# Patient Record
Sex: Male | Born: 1945 | Race: White | Hispanic: No | Smoking: Never smoker
Health system: Southern US, Community
[De-identification: ages and names within clinical notes are randomized; demographics above are authoritative.]

## PROBLEM LIST (undated history)

## (undated) ENCOUNTER — Emergency Department (HOSPITAL_COMMUNITY): Admission: EM | Payer: Self-pay | Source: Home / Self Care

## (undated) DIAGNOSIS — N189 Chronic kidney disease, unspecified: Secondary | ICD-10-CM

## (undated) DIAGNOSIS — Z974 Presence of external hearing-aid: Secondary | ICD-10-CM

## (undated) DIAGNOSIS — F319 Bipolar disorder, unspecified: Secondary | ICD-10-CM

## (undated) DIAGNOSIS — B029 Zoster without complications: Secondary | ICD-10-CM

## (undated) DIAGNOSIS — E871 Hypo-osmolality and hyponatremia: Secondary | ICD-10-CM

## (undated) DIAGNOSIS — R7989 Other specified abnormal findings of blood chemistry: Secondary | ICD-10-CM

## (undated) DIAGNOSIS — R0902 Hypoxemia: Secondary | ICD-10-CM

## (undated) DIAGNOSIS — W3400XA Accidental discharge from unspecified firearms or gun, initial encounter: Secondary | ICD-10-CM

## (undated) DIAGNOSIS — D649 Anemia, unspecified: Secondary | ICD-10-CM

## (undated) DIAGNOSIS — E079 Disorder of thyroid, unspecified: Secondary | ICD-10-CM

## (undated) DIAGNOSIS — J189 Pneumonia, unspecified organism: Secondary | ICD-10-CM

## (undated) DIAGNOSIS — K900 Celiac disease: Secondary | ICD-10-CM

## (undated) DIAGNOSIS — H9190 Unspecified hearing loss, unspecified ear: Secondary | ICD-10-CM

## (undated) DIAGNOSIS — K659 Peritonitis, unspecified: Secondary | ICD-10-CM

## (undated) HISTORY — DX: Celiac disease: K90.0

## (undated) HISTORY — DX: Disorder of thyroid, unspecified: E07.9

## (undated) HISTORY — DX: Other disorders of phosphorus metabolism: E83.39

## (undated) HISTORY — DX: Accidental discharge from unspecified firearms or gun, initial encounter: W34.00XA

## (undated) HISTORY — PX: TONSILLECTOMY: SUR1361

## (undated) HISTORY — DX: Other specified abnormal findings of blood chemistry: R79.89

## (undated) HISTORY — DX: Bipolar disorder, unspecified: F31.9

## (undated) HISTORY — PX: URETHRAL DILATION: SUR417

## (undated) HISTORY — PX: SPINE SURGERY: SHX786

## (undated) HISTORY — DX: Zoster without complications: B02.9

## (undated) HISTORY — DX: Chronic kidney disease, unspecified: N18.9

---

## 2005-01-29 DIAGNOSIS — W3400XA Accidental discharge from unspecified firearms or gun, initial encounter: Secondary | ICD-10-CM

## 2005-01-29 HISTORY — PX: BRAIN SURGERY: SHX531

## 2005-01-29 HISTORY — DX: Accidental discharge from unspecified firearms or gun, initial encounter: W34.00XA

## 2006-02-11 ENCOUNTER — Encounter (INDEPENDENT_AMBULATORY_CARE_PROVIDER_SITE_OTHER): Payer: Self-pay | Admitting: Family Medicine

## 2006-02-11 LAB — CONVERTED CEMR LAB
HDL: 20 mg/dL
LDL Cholesterol: 69 mg/dL

## 2006-07-25 ENCOUNTER — Ambulatory Visit: Payer: Self-pay | Admitting: Sports Medicine

## 2006-07-25 ENCOUNTER — Inpatient Hospital Stay (HOSPITAL_COMMUNITY): Admission: EM | Admit: 2006-07-25 | Discharge: 2006-07-30 | Payer: Self-pay | Admitting: Emergency Medicine

## 2006-07-29 ENCOUNTER — Encounter (INDEPENDENT_AMBULATORY_CARE_PROVIDER_SITE_OTHER): Payer: Self-pay | Admitting: Sports Medicine

## 2006-08-06 ENCOUNTER — Ambulatory Visit: Payer: Self-pay | Admitting: Family Medicine

## 2006-08-06 DIAGNOSIS — F319 Bipolar disorder, unspecified: Secondary | ICD-10-CM | POA: Insufficient documentation

## 2006-08-07 ENCOUNTER — Encounter (INDEPENDENT_AMBULATORY_CARE_PROVIDER_SITE_OTHER): Payer: Self-pay | Admitting: Family Medicine

## 2006-08-08 ENCOUNTER — Telehealth (INDEPENDENT_AMBULATORY_CARE_PROVIDER_SITE_OTHER): Payer: Self-pay | Admitting: Family Medicine

## 2006-08-08 ENCOUNTER — Encounter: Admission: RE | Admit: 2006-08-08 | Discharge: 2006-09-16 | Payer: Self-pay | Admitting: Family Medicine

## 2006-08-20 ENCOUNTER — Telehealth: Payer: Self-pay | Admitting: *Deleted

## 2006-08-20 ENCOUNTER — Telehealth (INDEPENDENT_AMBULATORY_CARE_PROVIDER_SITE_OTHER): Payer: Self-pay | Admitting: *Deleted

## 2006-08-21 ENCOUNTER — Telehealth: Payer: Self-pay | Admitting: *Deleted

## 2006-08-22 ENCOUNTER — Ambulatory Visit: Payer: Self-pay | Admitting: Family Medicine

## 2006-08-22 ENCOUNTER — Telehealth: Payer: Self-pay | Admitting: Family Medicine

## 2006-08-22 ENCOUNTER — Ambulatory Visit (HOSPITAL_COMMUNITY): Admission: RE | Admit: 2006-08-22 | Discharge: 2006-08-22 | Payer: Self-pay | Admitting: Family Medicine

## 2006-08-22 DIAGNOSIS — N186 End stage renal disease: Secondary | ICD-10-CM

## 2006-08-22 DIAGNOSIS — I951 Orthostatic hypotension: Secondary | ICD-10-CM

## 2006-08-22 DIAGNOSIS — Z992 Dependence on renal dialysis: Secondary | ICD-10-CM

## 2006-08-22 DIAGNOSIS — D649 Anemia, unspecified: Secondary | ICD-10-CM

## 2006-08-23 LAB — CONVERTED CEMR LAB
BUN: 56 mg/dL — ABNORMAL HIGH (ref 6–23)
Calcium: 8.3 mg/dL — ABNORMAL LOW (ref 8.4–10.5)
Creatinine, Ser: 2.22 mg/dL — ABNORMAL HIGH (ref 0.40–1.50)
Glucose, Bld: 95 mg/dL (ref 70–99)
Potassium: 2.9 meq/L — ABNORMAL LOW (ref 3.5–5.3)

## 2006-08-26 ENCOUNTER — Ambulatory Visit: Payer: Self-pay | Admitting: Family Medicine

## 2006-08-26 ENCOUNTER — Encounter (INDEPENDENT_AMBULATORY_CARE_PROVIDER_SITE_OTHER): Payer: Self-pay | Admitting: Family Medicine

## 2006-08-26 LAB — CONVERTED CEMR LAB
CO2: 22 meq/L (ref 19–32)
Calcium: 9 mg/dL (ref 8.4–10.5)
MCV: 91.2 fL (ref 78.0–100.0)
Platelets: 287 10*3/uL (ref 150–400)
Potassium: 4.6 meq/L (ref 3.5–5.3)
RBC: 3.86 M/uL — ABNORMAL LOW (ref 4.22–5.81)
Sodium: 141 meq/L (ref 135–145)
WBC: 8.8 10*3/uL (ref 4.0–10.5)

## 2006-08-28 ENCOUNTER — Telehealth: Payer: Self-pay | Admitting: Family Medicine

## 2006-08-29 ENCOUNTER — Telehealth (INDEPENDENT_AMBULATORY_CARE_PROVIDER_SITE_OTHER): Payer: Self-pay | Admitting: Family Medicine

## 2006-08-29 ENCOUNTER — Encounter: Payer: Self-pay | Admitting: *Deleted

## 2006-09-02 ENCOUNTER — Telehealth: Payer: Self-pay | Admitting: *Deleted

## 2006-09-03 ENCOUNTER — Ambulatory Visit (HOSPITAL_COMMUNITY): Admission: RE | Admit: 2006-09-03 | Discharge: 2006-09-03 | Payer: Self-pay | Admitting: Family Medicine

## 2006-09-03 ENCOUNTER — Encounter (INDEPENDENT_AMBULATORY_CARE_PROVIDER_SITE_OTHER): Payer: Self-pay | Admitting: Family Medicine

## 2006-09-03 ENCOUNTER — Telehealth: Payer: Self-pay | Admitting: *Deleted

## 2006-09-04 ENCOUNTER — Ambulatory Visit (HOSPITAL_COMMUNITY): Admission: RE | Admit: 2006-09-04 | Discharge: 2006-09-04 | Payer: Self-pay | Admitting: Family Medicine

## 2006-09-05 ENCOUNTER — Ambulatory Visit: Payer: Self-pay | Admitting: Family Medicine

## 2006-09-05 ENCOUNTER — Encounter (INDEPENDENT_AMBULATORY_CARE_PROVIDER_SITE_OTHER): Payer: Self-pay | Admitting: Family Medicine

## 2006-09-05 DIAGNOSIS — R1314 Dysphagia, pharyngoesophageal phase: Secondary | ICD-10-CM

## 2006-09-09 ENCOUNTER — Ambulatory Visit: Payer: Self-pay | Admitting: Sports Medicine

## 2006-09-09 DIAGNOSIS — K08109 Complete loss of teeth, unspecified cause, unspecified class: Secondary | ICD-10-CM | POA: Insufficient documentation

## 2006-09-13 ENCOUNTER — Telehealth: Payer: Self-pay | Admitting: *Deleted

## 2006-09-13 ENCOUNTER — Emergency Department (HOSPITAL_COMMUNITY): Admission: EM | Admit: 2006-09-13 | Discharge: 2006-09-13 | Payer: Self-pay | Admitting: Emergency Medicine

## 2006-09-18 ENCOUNTER — Telehealth: Payer: Self-pay | Admitting: *Deleted

## 2006-09-26 ENCOUNTER — Encounter: Payer: Self-pay | Admitting: Family Medicine

## 2006-09-27 ENCOUNTER — Telehealth (INDEPENDENT_AMBULATORY_CARE_PROVIDER_SITE_OTHER): Payer: Self-pay | Admitting: Family Medicine

## 2006-10-01 ENCOUNTER — Telehealth (INDEPENDENT_AMBULATORY_CARE_PROVIDER_SITE_OTHER): Payer: Self-pay | Admitting: Family Medicine

## 2006-10-17 ENCOUNTER — Encounter (INDEPENDENT_AMBULATORY_CARE_PROVIDER_SITE_OTHER): Payer: Self-pay | Admitting: Family Medicine

## 2006-10-17 ENCOUNTER — Ambulatory Visit: Payer: Self-pay | Admitting: Family Medicine

## 2006-10-18 ENCOUNTER — Encounter (INDEPENDENT_AMBULATORY_CARE_PROVIDER_SITE_OTHER): Payer: Self-pay | Admitting: Family Medicine

## 2006-10-22 ENCOUNTER — Encounter (INDEPENDENT_AMBULATORY_CARE_PROVIDER_SITE_OTHER): Payer: Self-pay | Admitting: Family Medicine

## 2006-12-18 ENCOUNTER — Telehealth: Payer: Self-pay | Admitting: *Deleted

## 2006-12-18 ENCOUNTER — Ambulatory Visit: Payer: Self-pay | Admitting: Family Medicine

## 2006-12-20 ENCOUNTER — Encounter (INDEPENDENT_AMBULATORY_CARE_PROVIDER_SITE_OTHER): Payer: Self-pay | Admitting: Family Medicine

## 2007-02-07 ENCOUNTER — Ambulatory Visit: Payer: Self-pay | Admitting: Family Medicine

## 2008-07-12 ENCOUNTER — Encounter (INDEPENDENT_AMBULATORY_CARE_PROVIDER_SITE_OTHER): Payer: Self-pay | Admitting: Family Medicine

## 2009-11-01 ENCOUNTER — Encounter: Payer: Self-pay | Admitting: Family Medicine

## 2010-02-28 NOTE — Assessment & Plan Note (Signed)
Summary: CKD Update Using (08/26/06) Cr = 2.08, Age 65, weight 130    Complete Medication List: 1)  Valproic Acid Liqd (Valproic acid) .... 250 mg by peg in the am and 575m at night by peg 2)  Zantac 150 Mg Caps (Ranitidine hcl) .... Give through peg tube daily 3)  Haldol Decanoate 50 Mg/ml Soln (Haloperidol decanoate) .... Take every 29th of each month at the family practice center

## 2010-03-20 ENCOUNTER — Encounter: Payer: Self-pay | Admitting: *Deleted
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
## 2010-11-14 LAB — CBC
Platelets: 420 — ABNORMAL HIGH
RBC: 3.83 — ABNORMAL LOW
WBC: 8.4

## 2010-11-14 LAB — BASIC METABOLIC PANEL
BUN: 33 — ABNORMAL HIGH
Creatinine, Ser: 2.34 — ABNORMAL HIGH
GFR calc Af Amer: 35 — ABNORMAL LOW
GFR calc non Af Amer: 29 — ABNORMAL LOW
Potassium: 4.5

## 2010-11-15 LAB — CBC
HCT: 22.8 — ABNORMAL LOW
HCT: 31.3 — ABNORMAL LOW
HCT: 31.3 — ABNORMAL LOW
HCT: 33.1 — ABNORMAL LOW
HCT: 33.8 — ABNORMAL LOW
Hemoglobin: 10.3 — ABNORMAL LOW
Hemoglobin: 10.3 — ABNORMAL LOW
Hemoglobin: 10.8 — ABNORMAL LOW
Hemoglobin: 11.1 — ABNORMAL LOW
MCHC: 32.9
MCV: 86.7
MCV: 87.1
MCV: 87.6
Platelets: 382
Platelets: 401 — ABNORMAL HIGH
RBC: 2.64 — ABNORMAL LOW
RBC: 2.93 — ABNORMAL LOW
RBC: 3.57 — ABNORMAL LOW
RDW: 15.9 — ABNORMAL HIGH
RDW: 15.9 — ABNORMAL HIGH
RDW: 16.1 — ABNORMAL HIGH
WBC: 14.6 — ABNORMAL HIGH
WBC: 17.7 — ABNORMAL HIGH
WBC: 33.3 — ABNORMAL HIGH
WBC: 7.2
WBC: 8.8

## 2010-11-15 LAB — BASIC METABOLIC PANEL
BUN: 40 — ABNORMAL HIGH
BUN: 46 — ABNORMAL HIGH
CO2: 23
CO2: 25
Calcium: 8.1 — ABNORMAL LOW
Calcium: 8.1 — ABNORMAL LOW
Chloride: 118 — ABNORMAL HIGH
Chloride: 123 — ABNORMAL HIGH
Chloride: 123 — ABNORMAL HIGH
Creatinine, Ser: 3.28 — ABNORMAL HIGH
Creatinine, Ser: 3.67 — ABNORMAL HIGH
GFR calc Af Amer: 21 — ABNORMAL LOW
GFR calc Af Amer: 23 — ABNORMAL LOW
GFR calc Af Amer: 26 — ABNORMAL LOW
GFR calc Af Amer: 34 — ABNORMAL LOW
GFR calc non Af Amer: 19 — ABNORMAL LOW
GFR calc non Af Amer: 21 — ABNORMAL LOW
GFR calc non Af Amer: 26 — ABNORMAL LOW
Glucose, Bld: 109 — ABNORMAL HIGH
Glucose, Bld: 118 — ABNORMAL HIGH
Glucose, Bld: 129 — ABNORMAL HIGH
Glucose, Bld: 130 — ABNORMAL HIGH
Potassium: 3.9
Potassium: 4.2
Potassium: 4.4
Potassium: 4.6
Sodium: 148 — ABNORMAL HIGH
Sodium: 150 — ABNORMAL HIGH
Sodium: 152 — ABNORMAL HIGH
Sodium: 154 — ABNORMAL HIGH

## 2010-11-15 LAB — PREPARE RBC (CROSSMATCH)

## 2010-11-15 LAB — COMPREHENSIVE METABOLIC PANEL
Alkaline Phosphatase: 225 — ABNORMAL HIGH
BUN: 55 — ABNORMAL HIGH
CO2: 22
Chloride: 101
GFR calc non Af Amer: 17 — ABNORMAL LOW
Glucose, Bld: 102 — ABNORMAL HIGH
Potassium: 5.2 — ABNORMAL HIGH
Total Bilirubin: 0.7

## 2010-11-15 LAB — CATH TIP CULTURE: Culture: 50

## 2010-11-15 LAB — DIFFERENTIAL
Basophils Absolute: 0
Basophils Relative: 0
Eosinophils Absolute: 0.1
Lymphocytes Relative: 6 — ABNORMAL LOW
Lymphs Abs: 0.9
Monocytes Absolute: 2.1 — ABNORMAL HIGH
Monocytes Relative: 4
Neutro Abs: 30 — ABNORMAL HIGH
Neutrophils Relative %: 89 — ABNORMAL HIGH
Neutrophils Relative %: 90 — ABNORMAL HIGH

## 2010-11-15 LAB — CULTURE, BLOOD (ROUTINE X 2): Culture: NO GROWTH

## 2010-11-15 LAB — RETICULOCYTES
Retic Count, Absolute: 54.8
Retic Count, Absolute: 55.8

## 2010-11-15 LAB — ABO/RH: ABO/RH(D): O NEG

## 2010-11-15 LAB — URINE CULTURE: Colony Count: NO GROWTH

## 2010-11-15 LAB — TYPE AND SCREEN: Antibody Screen: NEGATIVE

## 2010-11-15 LAB — URINALYSIS, ROUTINE W REFLEX MICROSCOPIC
Bilirubin Urine: NEGATIVE
Nitrite: NEGATIVE
Specific Gravity, Urine: 1.009
Urobilinogen, UA: 0.2

## 2010-11-15 LAB — CREATININE, SERUM: Creatinine, Ser: 3.65 — ABNORMAL HIGH

## 2010-11-15 LAB — IRON AND TIBC
Iron: 10 — ABNORMAL LOW
TIBC: 170 — ABNORMAL LOW
UIBC: 160

## 2010-11-15 LAB — SODIUM, URINE, RANDOM: Sodium, Ur: <10

## 2010-11-15 LAB — FOLATE RBC: RBC Folate: 1675 — ABNORMAL HIGH

## 2010-11-15 LAB — URINE MICROSCOPIC-ADD ON

## 2010-11-15 LAB — VITAMIN B12: Vitamin B-12: 283 (ref 211–911)

## 2010-11-15 LAB — FERRITIN: Ferritin: 974 — ABNORMAL HIGH (ref 22–322)

## 2013-01-29 DIAGNOSIS — N186 End stage renal disease: Secondary | ICD-10-CM | POA: Diagnosis not present

## 2013-01-31 DIAGNOSIS — N2581 Secondary hyperparathyroidism of renal origin: Secondary | ICD-10-CM | POA: Diagnosis not present

## 2013-01-31 DIAGNOSIS — E785 Hyperlipidemia, unspecified: Secondary | ICD-10-CM | POA: Diagnosis not present

## 2013-01-31 DIAGNOSIS — N186 End stage renal disease: Secondary | ICD-10-CM | POA: Diagnosis not present

## 2013-01-31 DIAGNOSIS — Z23 Encounter for immunization: Secondary | ICD-10-CM | POA: Diagnosis not present

## 2013-01-31 DIAGNOSIS — D631 Anemia in chronic kidney disease: Secondary | ICD-10-CM | POA: Diagnosis not present

## 2013-02-02 DIAGNOSIS — Z23 Encounter for immunization: Secondary | ICD-10-CM | POA: Diagnosis not present

## 2013-02-02 DIAGNOSIS — N186 End stage renal disease: Secondary | ICD-10-CM | POA: Diagnosis not present

## 2013-02-02 DIAGNOSIS — N2581 Secondary hyperparathyroidism of renal origin: Secondary | ICD-10-CM | POA: Diagnosis not present

## 2013-02-02 DIAGNOSIS — D631 Anemia in chronic kidney disease: Secondary | ICD-10-CM | POA: Diagnosis not present

## 2013-02-02 DIAGNOSIS — E785 Hyperlipidemia, unspecified: Secondary | ICD-10-CM | POA: Diagnosis not present

## 2013-02-04 DIAGNOSIS — E785 Hyperlipidemia, unspecified: Secondary | ICD-10-CM | POA: Diagnosis not present

## 2013-02-04 DIAGNOSIS — D631 Anemia in chronic kidney disease: Secondary | ICD-10-CM | POA: Diagnosis not present

## 2013-02-04 DIAGNOSIS — N2581 Secondary hyperparathyroidism of renal origin: Secondary | ICD-10-CM | POA: Diagnosis not present

## 2013-02-04 DIAGNOSIS — N186 End stage renal disease: Secondary | ICD-10-CM | POA: Diagnosis not present

## 2013-02-04 DIAGNOSIS — Z23 Encounter for immunization: Secondary | ICD-10-CM | POA: Diagnosis not present

## 2013-02-06 DIAGNOSIS — N186 End stage renal disease: Secondary | ICD-10-CM | POA: Diagnosis not present

## 2013-02-06 DIAGNOSIS — D631 Anemia in chronic kidney disease: Secondary | ICD-10-CM | POA: Diagnosis not present

## 2013-02-06 DIAGNOSIS — Z23 Encounter for immunization: Secondary | ICD-10-CM | POA: Diagnosis not present

## 2013-02-06 DIAGNOSIS — N2581 Secondary hyperparathyroidism of renal origin: Secondary | ICD-10-CM | POA: Diagnosis not present

## 2013-02-06 DIAGNOSIS — E785 Hyperlipidemia, unspecified: Secondary | ICD-10-CM | POA: Diagnosis not present

## 2013-02-09 DIAGNOSIS — Z23 Encounter for immunization: Secondary | ICD-10-CM | POA: Diagnosis not present

## 2013-02-09 DIAGNOSIS — D631 Anemia in chronic kidney disease: Secondary | ICD-10-CM | POA: Diagnosis not present

## 2013-02-09 DIAGNOSIS — N2581 Secondary hyperparathyroidism of renal origin: Secondary | ICD-10-CM | POA: Diagnosis not present

## 2013-02-09 DIAGNOSIS — E785 Hyperlipidemia, unspecified: Secondary | ICD-10-CM | POA: Diagnosis not present

## 2013-02-09 DIAGNOSIS — N186 End stage renal disease: Secondary | ICD-10-CM | POA: Diagnosis not present

## 2013-02-11 DIAGNOSIS — E785 Hyperlipidemia, unspecified: Secondary | ICD-10-CM | POA: Diagnosis not present

## 2013-02-11 DIAGNOSIS — D631 Anemia in chronic kidney disease: Secondary | ICD-10-CM | POA: Diagnosis not present

## 2013-02-11 DIAGNOSIS — N2581 Secondary hyperparathyroidism of renal origin: Secondary | ICD-10-CM | POA: Diagnosis not present

## 2013-02-11 DIAGNOSIS — N039 Chronic nephritic syndrome with unspecified morphologic changes: Secondary | ICD-10-CM | POA: Diagnosis not present

## 2013-02-11 DIAGNOSIS — N186 End stage renal disease: Secondary | ICD-10-CM | POA: Diagnosis not present

## 2013-02-11 DIAGNOSIS — Z23 Encounter for immunization: Secondary | ICD-10-CM | POA: Diagnosis not present

## 2013-02-16 DIAGNOSIS — D631 Anemia in chronic kidney disease: Secondary | ICD-10-CM | POA: Diagnosis not present

## 2013-02-16 DIAGNOSIS — Z23 Encounter for immunization: Secondary | ICD-10-CM | POA: Diagnosis not present

## 2013-02-16 DIAGNOSIS — E785 Hyperlipidemia, unspecified: Secondary | ICD-10-CM | POA: Diagnosis not present

## 2013-02-16 DIAGNOSIS — N186 End stage renal disease: Secondary | ICD-10-CM | POA: Diagnosis not present

## 2013-02-16 DIAGNOSIS — N2581 Secondary hyperparathyroidism of renal origin: Secondary | ICD-10-CM | POA: Diagnosis not present

## 2013-02-18 DIAGNOSIS — E785 Hyperlipidemia, unspecified: Secondary | ICD-10-CM | POA: Diagnosis not present

## 2013-02-18 DIAGNOSIS — Z23 Encounter for immunization: Secondary | ICD-10-CM | POA: Diagnosis not present

## 2013-02-18 DIAGNOSIS — N186 End stage renal disease: Secondary | ICD-10-CM | POA: Diagnosis not present

## 2013-02-18 DIAGNOSIS — D631 Anemia in chronic kidney disease: Secondary | ICD-10-CM | POA: Diagnosis not present

## 2013-02-18 DIAGNOSIS — N2581 Secondary hyperparathyroidism of renal origin: Secondary | ICD-10-CM | POA: Diagnosis not present

## 2013-02-20 DIAGNOSIS — E785 Hyperlipidemia, unspecified: Secondary | ICD-10-CM | POA: Diagnosis not present

## 2013-02-20 DIAGNOSIS — N2581 Secondary hyperparathyroidism of renal origin: Secondary | ICD-10-CM | POA: Diagnosis not present

## 2013-02-20 DIAGNOSIS — Z23 Encounter for immunization: Secondary | ICD-10-CM | POA: Diagnosis not present

## 2013-02-20 DIAGNOSIS — D631 Anemia in chronic kidney disease: Secondary | ICD-10-CM | POA: Diagnosis not present

## 2013-02-20 DIAGNOSIS — N186 End stage renal disease: Secondary | ICD-10-CM | POA: Diagnosis not present

## 2013-02-23 DIAGNOSIS — E785 Hyperlipidemia, unspecified: Secondary | ICD-10-CM | POA: Diagnosis not present

## 2013-02-23 DIAGNOSIS — Z23 Encounter for immunization: Secondary | ICD-10-CM | POA: Diagnosis not present

## 2013-02-23 DIAGNOSIS — N2581 Secondary hyperparathyroidism of renal origin: Secondary | ICD-10-CM | POA: Diagnosis not present

## 2013-02-23 DIAGNOSIS — D631 Anemia in chronic kidney disease: Secondary | ICD-10-CM | POA: Diagnosis not present

## 2013-02-23 DIAGNOSIS — N186 End stage renal disease: Secondary | ICD-10-CM | POA: Diagnosis not present

## 2013-02-25 DIAGNOSIS — Z23 Encounter for immunization: Secondary | ICD-10-CM | POA: Diagnosis not present

## 2013-02-25 DIAGNOSIS — N2581 Secondary hyperparathyroidism of renal origin: Secondary | ICD-10-CM | POA: Diagnosis not present

## 2013-02-25 DIAGNOSIS — N186 End stage renal disease: Secondary | ICD-10-CM | POA: Diagnosis not present

## 2013-02-25 DIAGNOSIS — E785 Hyperlipidemia, unspecified: Secondary | ICD-10-CM | POA: Diagnosis not present

## 2013-02-25 DIAGNOSIS — D631 Anemia in chronic kidney disease: Secondary | ICD-10-CM | POA: Diagnosis not present

## 2013-02-27 DIAGNOSIS — N2581 Secondary hyperparathyroidism of renal origin: Secondary | ICD-10-CM | POA: Diagnosis not present

## 2013-02-27 DIAGNOSIS — N186 End stage renal disease: Secondary | ICD-10-CM | POA: Diagnosis not present

## 2013-02-27 DIAGNOSIS — E785 Hyperlipidemia, unspecified: Secondary | ICD-10-CM | POA: Diagnosis not present

## 2013-02-27 DIAGNOSIS — D631 Anemia in chronic kidney disease: Secondary | ICD-10-CM | POA: Diagnosis not present

## 2013-02-27 DIAGNOSIS — Z23 Encounter for immunization: Secondary | ICD-10-CM | POA: Diagnosis not present

## 2013-03-04 DIAGNOSIS — N186 End stage renal disease: Secondary | ICD-10-CM | POA: Diagnosis not present

## 2013-03-04 DIAGNOSIS — N2581 Secondary hyperparathyroidism of renal origin: Secondary | ICD-10-CM | POA: Diagnosis not present

## 2013-03-04 DIAGNOSIS — D631 Anemia in chronic kidney disease: Secondary | ICD-10-CM | POA: Diagnosis not present

## 2013-03-04 DIAGNOSIS — N039 Chronic nephritic syndrome with unspecified morphologic changes: Secondary | ICD-10-CM | POA: Diagnosis not present

## 2013-03-06 DIAGNOSIS — N039 Chronic nephritic syndrome with unspecified morphologic changes: Secondary | ICD-10-CM | POA: Diagnosis not present

## 2013-03-06 DIAGNOSIS — N186 End stage renal disease: Secondary | ICD-10-CM | POA: Diagnosis not present

## 2013-03-06 DIAGNOSIS — N2581 Secondary hyperparathyroidism of renal origin: Secondary | ICD-10-CM | POA: Diagnosis not present

## 2013-03-06 DIAGNOSIS — D631 Anemia in chronic kidney disease: Secondary | ICD-10-CM | POA: Diagnosis not present

## 2013-03-11 DIAGNOSIS — N186 End stage renal disease: Secondary | ICD-10-CM | POA: Diagnosis not present

## 2013-03-11 DIAGNOSIS — N2581 Secondary hyperparathyroidism of renal origin: Secondary | ICD-10-CM | POA: Diagnosis not present

## 2013-03-11 DIAGNOSIS — D631 Anemia in chronic kidney disease: Secondary | ICD-10-CM | POA: Diagnosis not present

## 2013-03-12 DIAGNOSIS — F3174 Bipolar disorder, in full remission, most recent episode manic: Secondary | ICD-10-CM | POA: Diagnosis not present

## 2013-03-13 DIAGNOSIS — D631 Anemia in chronic kidney disease: Secondary | ICD-10-CM | POA: Diagnosis not present

## 2013-03-13 DIAGNOSIS — N186 End stage renal disease: Secondary | ICD-10-CM | POA: Diagnosis not present

## 2013-03-13 DIAGNOSIS — N2581 Secondary hyperparathyroidism of renal origin: Secondary | ICD-10-CM | POA: Diagnosis not present

## 2013-03-16 DIAGNOSIS — N186 End stage renal disease: Secondary | ICD-10-CM | POA: Diagnosis not present

## 2013-03-16 DIAGNOSIS — N2581 Secondary hyperparathyroidism of renal origin: Secondary | ICD-10-CM | POA: Diagnosis not present

## 2013-03-16 DIAGNOSIS — Z992 Dependence on renal dialysis: Secondary | ICD-10-CM | POA: Diagnosis not present

## 2013-03-16 DIAGNOSIS — D631 Anemia in chronic kidney disease: Secondary | ICD-10-CM | POA: Diagnosis not present

## 2013-03-16 DIAGNOSIS — I120 Hypertensive chronic kidney disease with stage 5 chronic kidney disease or end stage renal disease: Secondary | ICD-10-CM | POA: Diagnosis not present

## 2013-03-18 DIAGNOSIS — N186 End stage renal disease: Secondary | ICD-10-CM | POA: Diagnosis not present

## 2013-03-18 DIAGNOSIS — N2581 Secondary hyperparathyroidism of renal origin: Secondary | ICD-10-CM | POA: Diagnosis not present

## 2013-03-18 DIAGNOSIS — N039 Chronic nephritic syndrome with unspecified morphologic changes: Secondary | ICD-10-CM | POA: Diagnosis not present

## 2013-03-18 DIAGNOSIS — D631 Anemia in chronic kidney disease: Secondary | ICD-10-CM | POA: Diagnosis not present

## 2013-03-23 DIAGNOSIS — N2581 Secondary hyperparathyroidism of renal origin: Secondary | ICD-10-CM | POA: Diagnosis not present

## 2013-03-23 DIAGNOSIS — N039 Chronic nephritic syndrome with unspecified morphologic changes: Secondary | ICD-10-CM | POA: Diagnosis not present

## 2013-03-23 DIAGNOSIS — N186 End stage renal disease: Secondary | ICD-10-CM | POA: Diagnosis not present

## 2013-03-23 DIAGNOSIS — D631 Anemia in chronic kidney disease: Secondary | ICD-10-CM | POA: Diagnosis not present

## 2013-03-25 DIAGNOSIS — N2581 Secondary hyperparathyroidism of renal origin: Secondary | ICD-10-CM | POA: Diagnosis not present

## 2013-03-25 DIAGNOSIS — N186 End stage renal disease: Secondary | ICD-10-CM | POA: Diagnosis not present

## 2013-03-25 DIAGNOSIS — N039 Chronic nephritic syndrome with unspecified morphologic changes: Secondary | ICD-10-CM | POA: Diagnosis not present

## 2013-03-25 DIAGNOSIS — D631 Anemia in chronic kidney disease: Secondary | ICD-10-CM | POA: Diagnosis not present

## 2013-03-29 DIAGNOSIS — N186 End stage renal disease: Secondary | ICD-10-CM | POA: Diagnosis not present

## 2013-03-30 DIAGNOSIS — N186 End stage renal disease: Secondary | ICD-10-CM | POA: Diagnosis not present

## 2013-03-30 DIAGNOSIS — D631 Anemia in chronic kidney disease: Secondary | ICD-10-CM | POA: Diagnosis not present

## 2013-03-30 DIAGNOSIS — N2581 Secondary hyperparathyroidism of renal origin: Secondary | ICD-10-CM | POA: Diagnosis not present

## 2013-03-30 DIAGNOSIS — D509 Iron deficiency anemia, unspecified: Secondary | ICD-10-CM | POA: Diagnosis not present

## 2013-04-01 DIAGNOSIS — N2581 Secondary hyperparathyroidism of renal origin: Secondary | ICD-10-CM | POA: Diagnosis not present

## 2013-04-01 DIAGNOSIS — D509 Iron deficiency anemia, unspecified: Secondary | ICD-10-CM | POA: Diagnosis not present

## 2013-04-01 DIAGNOSIS — N186 End stage renal disease: Secondary | ICD-10-CM | POA: Diagnosis not present

## 2013-04-01 DIAGNOSIS — D631 Anemia in chronic kidney disease: Secondary | ICD-10-CM | POA: Diagnosis not present

## 2013-04-06 DIAGNOSIS — D631 Anemia in chronic kidney disease: Secondary | ICD-10-CM | POA: Diagnosis not present

## 2013-04-06 DIAGNOSIS — F329 Major depressive disorder, single episode, unspecified: Secondary | ICD-10-CM | POA: Diagnosis not present

## 2013-04-06 DIAGNOSIS — N2581 Secondary hyperparathyroidism of renal origin: Secondary | ICD-10-CM | POA: Diagnosis not present

## 2013-04-06 DIAGNOSIS — N259 Disorder resulting from impaired renal tubular function, unspecified: Secondary | ICD-10-CM | POA: Diagnosis not present

## 2013-04-06 DIAGNOSIS — N186 End stage renal disease: Secondary | ICD-10-CM | POA: Diagnosis not present

## 2013-04-06 DIAGNOSIS — I10 Essential (primary) hypertension: Secondary | ICD-10-CM | POA: Diagnosis not present

## 2013-04-06 DIAGNOSIS — D509 Iron deficiency anemia, unspecified: Secondary | ICD-10-CM | POA: Diagnosis not present

## 2013-04-06 DIAGNOSIS — F3289 Other specified depressive episodes: Secondary | ICD-10-CM | POA: Diagnosis not present

## 2013-04-06 DIAGNOSIS — F3110 Bipolar disorder, current episode manic without psychotic features, unspecified: Secondary | ICD-10-CM | POA: Diagnosis not present

## 2013-04-08 DIAGNOSIS — D509 Iron deficiency anemia, unspecified: Secondary | ICD-10-CM | POA: Diagnosis not present

## 2013-04-08 DIAGNOSIS — N186 End stage renal disease: Secondary | ICD-10-CM | POA: Diagnosis not present

## 2013-04-08 DIAGNOSIS — D631 Anemia in chronic kidney disease: Secondary | ICD-10-CM | POA: Diagnosis not present

## 2013-04-08 DIAGNOSIS — N2581 Secondary hyperparathyroidism of renal origin: Secondary | ICD-10-CM | POA: Diagnosis not present

## 2013-04-10 DIAGNOSIS — N186 End stage renal disease: Secondary | ICD-10-CM | POA: Diagnosis not present

## 2013-04-10 DIAGNOSIS — N039 Chronic nephritic syndrome with unspecified morphologic changes: Secondary | ICD-10-CM | POA: Diagnosis not present

## 2013-04-10 DIAGNOSIS — D509 Iron deficiency anemia, unspecified: Secondary | ICD-10-CM | POA: Diagnosis not present

## 2013-04-10 DIAGNOSIS — N2581 Secondary hyperparathyroidism of renal origin: Secondary | ICD-10-CM | POA: Diagnosis not present

## 2013-04-10 DIAGNOSIS — D631 Anemia in chronic kidney disease: Secondary | ICD-10-CM | POA: Diagnosis not present

## 2013-04-13 DIAGNOSIS — N039 Chronic nephritic syndrome with unspecified morphologic changes: Secondary | ICD-10-CM | POA: Diagnosis not present

## 2013-04-13 DIAGNOSIS — D509 Iron deficiency anemia, unspecified: Secondary | ICD-10-CM | POA: Diagnosis not present

## 2013-04-13 DIAGNOSIS — N2581 Secondary hyperparathyroidism of renal origin: Secondary | ICD-10-CM | POA: Diagnosis not present

## 2013-04-13 DIAGNOSIS — N186 End stage renal disease: Secondary | ICD-10-CM | POA: Diagnosis not present

## 2013-04-13 DIAGNOSIS — D631 Anemia in chronic kidney disease: Secondary | ICD-10-CM | POA: Diagnosis not present

## 2013-04-15 DIAGNOSIS — D631 Anemia in chronic kidney disease: Secondary | ICD-10-CM | POA: Diagnosis not present

## 2013-04-15 DIAGNOSIS — N2581 Secondary hyperparathyroidism of renal origin: Secondary | ICD-10-CM | POA: Diagnosis not present

## 2013-04-15 DIAGNOSIS — N186 End stage renal disease: Secondary | ICD-10-CM | POA: Diagnosis not present

## 2013-04-15 DIAGNOSIS — D509 Iron deficiency anemia, unspecified: Secondary | ICD-10-CM | POA: Diagnosis not present

## 2013-04-20 DIAGNOSIS — N186 End stage renal disease: Secondary | ICD-10-CM | POA: Diagnosis not present

## 2013-04-20 DIAGNOSIS — D631 Anemia in chronic kidney disease: Secondary | ICD-10-CM | POA: Diagnosis not present

## 2013-04-20 DIAGNOSIS — D509 Iron deficiency anemia, unspecified: Secondary | ICD-10-CM | POA: Diagnosis not present

## 2013-04-20 DIAGNOSIS — N039 Chronic nephritic syndrome with unspecified morphologic changes: Secondary | ICD-10-CM | POA: Diagnosis not present

## 2013-04-20 DIAGNOSIS — N2581 Secondary hyperparathyroidism of renal origin: Secondary | ICD-10-CM | POA: Diagnosis not present

## 2013-04-22 DIAGNOSIS — N2581 Secondary hyperparathyroidism of renal origin: Secondary | ICD-10-CM | POA: Diagnosis not present

## 2013-04-22 DIAGNOSIS — N186 End stage renal disease: Secondary | ICD-10-CM | POA: Diagnosis not present

## 2013-04-22 DIAGNOSIS — D631 Anemia in chronic kidney disease: Secondary | ICD-10-CM | POA: Diagnosis not present

## 2013-04-22 DIAGNOSIS — D509 Iron deficiency anemia, unspecified: Secondary | ICD-10-CM | POA: Diagnosis not present

## 2013-04-24 DIAGNOSIS — N186 End stage renal disease: Secondary | ICD-10-CM | POA: Diagnosis not present

## 2013-04-24 DIAGNOSIS — D509 Iron deficiency anemia, unspecified: Secondary | ICD-10-CM | POA: Diagnosis not present

## 2013-04-24 DIAGNOSIS — D631 Anemia in chronic kidney disease: Secondary | ICD-10-CM | POA: Diagnosis not present

## 2013-04-24 DIAGNOSIS — N2581 Secondary hyperparathyroidism of renal origin: Secondary | ICD-10-CM | POA: Diagnosis not present

## 2013-04-27 DIAGNOSIS — N186 End stage renal disease: Secondary | ICD-10-CM | POA: Diagnosis not present

## 2013-04-27 DIAGNOSIS — D631 Anemia in chronic kidney disease: Secondary | ICD-10-CM | POA: Diagnosis not present

## 2013-04-27 DIAGNOSIS — N2581 Secondary hyperparathyroidism of renal origin: Secondary | ICD-10-CM | POA: Diagnosis not present

## 2013-04-27 DIAGNOSIS — D509 Iron deficiency anemia, unspecified: Secondary | ICD-10-CM | POA: Diagnosis not present

## 2013-04-29 DIAGNOSIS — N039 Chronic nephritic syndrome with unspecified morphologic changes: Secondary | ICD-10-CM | POA: Diagnosis not present

## 2013-04-29 DIAGNOSIS — N186 End stage renal disease: Secondary | ICD-10-CM | POA: Diagnosis not present

## 2013-04-29 DIAGNOSIS — D631 Anemia in chronic kidney disease: Secondary | ICD-10-CM | POA: Diagnosis not present

## 2013-04-29 DIAGNOSIS — D509 Iron deficiency anemia, unspecified: Secondary | ICD-10-CM | POA: Diagnosis not present

## 2013-04-29 DIAGNOSIS — N2581 Secondary hyperparathyroidism of renal origin: Secondary | ICD-10-CM | POA: Diagnosis not present

## 2013-05-01 DIAGNOSIS — D631 Anemia in chronic kidney disease: Secondary | ICD-10-CM | POA: Diagnosis not present

## 2013-05-01 DIAGNOSIS — N2581 Secondary hyperparathyroidism of renal origin: Secondary | ICD-10-CM | POA: Diagnosis not present

## 2013-05-01 DIAGNOSIS — D509 Iron deficiency anemia, unspecified: Secondary | ICD-10-CM | POA: Diagnosis not present

## 2013-05-01 DIAGNOSIS — N186 End stage renal disease: Secondary | ICD-10-CM | POA: Diagnosis not present

## 2013-05-04 DIAGNOSIS — E0590 Thyrotoxicosis, unspecified without thyrotoxic crisis or storm: Secondary | ICD-10-CM | POA: Diagnosis not present

## 2013-05-04 DIAGNOSIS — N2581 Secondary hyperparathyroidism of renal origin: Secondary | ICD-10-CM | POA: Diagnosis not present

## 2013-05-04 DIAGNOSIS — F329 Major depressive disorder, single episode, unspecified: Secondary | ICD-10-CM | POA: Diagnosis not present

## 2013-05-04 DIAGNOSIS — F3289 Other specified depressive episodes: Secondary | ICD-10-CM | POA: Diagnosis not present

## 2013-05-04 DIAGNOSIS — N259 Disorder resulting from impaired renal tubular function, unspecified: Secondary | ICD-10-CM | POA: Diagnosis not present

## 2013-05-04 DIAGNOSIS — I10 Essential (primary) hypertension: Secondary | ICD-10-CM | POA: Diagnosis not present

## 2013-05-04 DIAGNOSIS — N186 End stage renal disease: Secondary | ICD-10-CM | POA: Diagnosis not present

## 2013-05-04 DIAGNOSIS — D631 Anemia in chronic kidney disease: Secondary | ICD-10-CM | POA: Diagnosis not present

## 2013-05-04 DIAGNOSIS — F3110 Bipolar disorder, current episode manic without psychotic features, unspecified: Secondary | ICD-10-CM | POA: Diagnosis not present

## 2013-05-04 DIAGNOSIS — D509 Iron deficiency anemia, unspecified: Secondary | ICD-10-CM | POA: Diagnosis not present

## 2013-05-06 DIAGNOSIS — N039 Chronic nephritic syndrome with unspecified morphologic changes: Secondary | ICD-10-CM | POA: Diagnosis not present

## 2013-05-06 DIAGNOSIS — D509 Iron deficiency anemia, unspecified: Secondary | ICD-10-CM | POA: Diagnosis not present

## 2013-05-06 DIAGNOSIS — D631 Anemia in chronic kidney disease: Secondary | ICD-10-CM | POA: Diagnosis not present

## 2013-05-06 DIAGNOSIS — N186 End stage renal disease: Secondary | ICD-10-CM | POA: Diagnosis not present

## 2013-05-06 DIAGNOSIS — N2581 Secondary hyperparathyroidism of renal origin: Secondary | ICD-10-CM | POA: Diagnosis not present

## 2013-05-08 DIAGNOSIS — N2581 Secondary hyperparathyroidism of renal origin: Secondary | ICD-10-CM | POA: Diagnosis not present

## 2013-05-08 DIAGNOSIS — N186 End stage renal disease: Secondary | ICD-10-CM | POA: Diagnosis not present

## 2013-05-08 DIAGNOSIS — D509 Iron deficiency anemia, unspecified: Secondary | ICD-10-CM | POA: Diagnosis not present

## 2013-05-08 DIAGNOSIS — D631 Anemia in chronic kidney disease: Secondary | ICD-10-CM | POA: Diagnosis not present

## 2013-05-11 DIAGNOSIS — D631 Anemia in chronic kidney disease: Secondary | ICD-10-CM | POA: Diagnosis not present

## 2013-05-11 DIAGNOSIS — N2581 Secondary hyperparathyroidism of renal origin: Secondary | ICD-10-CM | POA: Diagnosis not present

## 2013-05-11 DIAGNOSIS — N186 End stage renal disease: Secondary | ICD-10-CM | POA: Diagnosis not present

## 2013-05-11 DIAGNOSIS — D509 Iron deficiency anemia, unspecified: Secondary | ICD-10-CM | POA: Diagnosis not present

## 2013-05-13 DIAGNOSIS — D509 Iron deficiency anemia, unspecified: Secondary | ICD-10-CM | POA: Diagnosis not present

## 2013-05-13 DIAGNOSIS — N186 End stage renal disease: Secondary | ICD-10-CM | POA: Diagnosis not present

## 2013-05-13 DIAGNOSIS — D631 Anemia in chronic kidney disease: Secondary | ICD-10-CM | POA: Diagnosis not present

## 2013-05-13 DIAGNOSIS — N2581 Secondary hyperparathyroidism of renal origin: Secondary | ICD-10-CM | POA: Diagnosis not present

## 2013-05-15 DIAGNOSIS — D509 Iron deficiency anemia, unspecified: Secondary | ICD-10-CM | POA: Diagnosis not present

## 2013-05-15 DIAGNOSIS — N2581 Secondary hyperparathyroidism of renal origin: Secondary | ICD-10-CM | POA: Diagnosis not present

## 2013-05-15 DIAGNOSIS — N186 End stage renal disease: Secondary | ICD-10-CM | POA: Diagnosis not present

## 2013-05-15 DIAGNOSIS — N039 Chronic nephritic syndrome with unspecified morphologic changes: Secondary | ICD-10-CM | POA: Diagnosis not present

## 2013-05-15 DIAGNOSIS — D631 Anemia in chronic kidney disease: Secondary | ICD-10-CM | POA: Diagnosis not present

## 2013-05-20 DIAGNOSIS — D509 Iron deficiency anemia, unspecified: Secondary | ICD-10-CM | POA: Diagnosis not present

## 2013-05-20 DIAGNOSIS — D631 Anemia in chronic kidney disease: Secondary | ICD-10-CM | POA: Diagnosis not present

## 2013-05-20 DIAGNOSIS — N186 End stage renal disease: Secondary | ICD-10-CM | POA: Diagnosis not present

## 2013-05-20 DIAGNOSIS — N2581 Secondary hyperparathyroidism of renal origin: Secondary | ICD-10-CM | POA: Diagnosis not present

## 2013-05-22 DIAGNOSIS — D509 Iron deficiency anemia, unspecified: Secondary | ICD-10-CM | POA: Diagnosis not present

## 2013-05-22 DIAGNOSIS — N2581 Secondary hyperparathyroidism of renal origin: Secondary | ICD-10-CM | POA: Diagnosis not present

## 2013-05-22 DIAGNOSIS — D631 Anemia in chronic kidney disease: Secondary | ICD-10-CM | POA: Diagnosis not present

## 2013-05-22 DIAGNOSIS — N186 End stage renal disease: Secondary | ICD-10-CM | POA: Diagnosis not present

## 2013-05-25 DIAGNOSIS — N2581 Secondary hyperparathyroidism of renal origin: Secondary | ICD-10-CM | POA: Diagnosis not present

## 2013-05-25 DIAGNOSIS — D509 Iron deficiency anemia, unspecified: Secondary | ICD-10-CM | POA: Diagnosis not present

## 2013-05-25 DIAGNOSIS — N186 End stage renal disease: Secondary | ICD-10-CM | POA: Diagnosis not present

## 2013-05-25 DIAGNOSIS — D631 Anemia in chronic kidney disease: Secondary | ICD-10-CM | POA: Diagnosis not present

## 2013-05-27 DIAGNOSIS — D509 Iron deficiency anemia, unspecified: Secondary | ICD-10-CM | POA: Diagnosis not present

## 2013-05-27 DIAGNOSIS — N2581 Secondary hyperparathyroidism of renal origin: Secondary | ICD-10-CM | POA: Diagnosis not present

## 2013-05-27 DIAGNOSIS — N039 Chronic nephritic syndrome with unspecified morphologic changes: Secondary | ICD-10-CM | POA: Diagnosis not present

## 2013-05-27 DIAGNOSIS — D631 Anemia in chronic kidney disease: Secondary | ICD-10-CM | POA: Diagnosis not present

## 2013-05-27 DIAGNOSIS — N186 End stage renal disease: Secondary | ICD-10-CM | POA: Diagnosis not present

## 2013-05-29 DIAGNOSIS — N2581 Secondary hyperparathyroidism of renal origin: Secondary | ICD-10-CM | POA: Diagnosis not present

## 2013-05-29 DIAGNOSIS — N186 End stage renal disease: Secondary | ICD-10-CM | POA: Diagnosis not present

## 2013-06-01 DIAGNOSIS — N2581 Secondary hyperparathyroidism of renal origin: Secondary | ICD-10-CM | POA: Diagnosis not present

## 2013-06-01 DIAGNOSIS — N186 End stage renal disease: Secondary | ICD-10-CM | POA: Diagnosis not present

## 2013-06-03 DIAGNOSIS — N186 End stage renal disease: Secondary | ICD-10-CM | POA: Diagnosis not present

## 2013-06-03 DIAGNOSIS — N2581 Secondary hyperparathyroidism of renal origin: Secondary | ICD-10-CM | POA: Diagnosis not present

## 2013-06-04 DIAGNOSIS — F3174 Bipolar disorder, in full remission, most recent episode manic: Secondary | ICD-10-CM | POA: Diagnosis not present

## 2013-06-05 DIAGNOSIS — N2581 Secondary hyperparathyroidism of renal origin: Secondary | ICD-10-CM | POA: Diagnosis not present

## 2013-06-05 DIAGNOSIS — N186 End stage renal disease: Secondary | ICD-10-CM | POA: Diagnosis not present

## 2013-06-08 DIAGNOSIS — N186 End stage renal disease: Secondary | ICD-10-CM | POA: Diagnosis not present

## 2013-06-08 DIAGNOSIS — N2581 Secondary hyperparathyroidism of renal origin: Secondary | ICD-10-CM | POA: Diagnosis not present

## 2013-06-10 DIAGNOSIS — N2581 Secondary hyperparathyroidism of renal origin: Secondary | ICD-10-CM | POA: Diagnosis not present

## 2013-06-10 DIAGNOSIS — N186 End stage renal disease: Secondary | ICD-10-CM | POA: Diagnosis not present

## 2013-06-12 DIAGNOSIS — N186 End stage renal disease: Secondary | ICD-10-CM | POA: Diagnosis not present

## 2013-06-12 DIAGNOSIS — N2581 Secondary hyperparathyroidism of renal origin: Secondary | ICD-10-CM | POA: Diagnosis not present

## 2013-06-15 DIAGNOSIS — N186 End stage renal disease: Secondary | ICD-10-CM | POA: Diagnosis not present

## 2013-06-15 DIAGNOSIS — N2581 Secondary hyperparathyroidism of renal origin: Secondary | ICD-10-CM | POA: Diagnosis not present

## 2013-06-18 DIAGNOSIS — N2581 Secondary hyperparathyroidism of renal origin: Secondary | ICD-10-CM | POA: Diagnosis not present

## 2013-06-18 DIAGNOSIS — N186 End stage renal disease: Secondary | ICD-10-CM | POA: Diagnosis not present

## 2013-06-22 DIAGNOSIS — N186 End stage renal disease: Secondary | ICD-10-CM | POA: Diagnosis not present

## 2013-06-22 DIAGNOSIS — N2581 Secondary hyperparathyroidism of renal origin: Secondary | ICD-10-CM | POA: Diagnosis not present

## 2013-06-24 DIAGNOSIS — N2581 Secondary hyperparathyroidism of renal origin: Secondary | ICD-10-CM | POA: Diagnosis not present

## 2013-06-24 DIAGNOSIS — N186 End stage renal disease: Secondary | ICD-10-CM | POA: Diagnosis not present

## 2013-06-26 DIAGNOSIS — N186 End stage renal disease: Secondary | ICD-10-CM | POA: Diagnosis not present

## 2013-06-26 DIAGNOSIS — N2581 Secondary hyperparathyroidism of renal origin: Secondary | ICD-10-CM | POA: Diagnosis not present

## 2013-06-26 DIAGNOSIS — T148XXA Other injury of unspecified body region, initial encounter: Secondary | ICD-10-CM | POA: Diagnosis not present

## 2013-06-26 DIAGNOSIS — W460XXA Contact with hypodermic needle, initial encounter: Secondary | ICD-10-CM | POA: Diagnosis not present

## 2013-06-29 DIAGNOSIS — N2581 Secondary hyperparathyroidism of renal origin: Secondary | ICD-10-CM | POA: Diagnosis not present

## 2013-06-29 DIAGNOSIS — N039 Chronic nephritic syndrome with unspecified morphologic changes: Secondary | ICD-10-CM | POA: Diagnosis not present

## 2013-06-29 DIAGNOSIS — N186 End stage renal disease: Secondary | ICD-10-CM | POA: Diagnosis not present

## 2013-06-29 DIAGNOSIS — D631 Anemia in chronic kidney disease: Secondary | ICD-10-CM | POA: Diagnosis not present

## 2013-07-01 DIAGNOSIS — D631 Anemia in chronic kidney disease: Secondary | ICD-10-CM | POA: Diagnosis not present

## 2013-07-01 DIAGNOSIS — N039 Chronic nephritic syndrome with unspecified morphologic changes: Secondary | ICD-10-CM | POA: Diagnosis not present

## 2013-07-01 DIAGNOSIS — N2581 Secondary hyperparathyroidism of renal origin: Secondary | ICD-10-CM | POA: Diagnosis not present

## 2013-07-01 DIAGNOSIS — N186 End stage renal disease: Secondary | ICD-10-CM | POA: Diagnosis not present

## 2013-07-03 DIAGNOSIS — N2581 Secondary hyperparathyroidism of renal origin: Secondary | ICD-10-CM | POA: Diagnosis not present

## 2013-07-03 DIAGNOSIS — N186 End stage renal disease: Secondary | ICD-10-CM | POA: Diagnosis not present

## 2013-07-03 DIAGNOSIS — D631 Anemia in chronic kidney disease: Secondary | ICD-10-CM | POA: Diagnosis not present

## 2013-07-06 DIAGNOSIS — N039 Chronic nephritic syndrome with unspecified morphologic changes: Secondary | ICD-10-CM | POA: Diagnosis not present

## 2013-07-06 DIAGNOSIS — D631 Anemia in chronic kidney disease: Secondary | ICD-10-CM | POA: Diagnosis not present

## 2013-07-06 DIAGNOSIS — N2581 Secondary hyperparathyroidism of renal origin: Secondary | ICD-10-CM | POA: Diagnosis not present

## 2013-07-06 DIAGNOSIS — N186 End stage renal disease: Secondary | ICD-10-CM | POA: Diagnosis not present

## 2013-07-08 DIAGNOSIS — N186 End stage renal disease: Secondary | ICD-10-CM | POA: Diagnosis not present

## 2013-07-08 DIAGNOSIS — D631 Anemia in chronic kidney disease: Secondary | ICD-10-CM | POA: Diagnosis not present

## 2013-07-08 DIAGNOSIS — N2581 Secondary hyperparathyroidism of renal origin: Secondary | ICD-10-CM | POA: Diagnosis not present

## 2013-07-10 DIAGNOSIS — N039 Chronic nephritic syndrome with unspecified morphologic changes: Secondary | ICD-10-CM | POA: Diagnosis not present

## 2013-07-10 DIAGNOSIS — N2581 Secondary hyperparathyroidism of renal origin: Secondary | ICD-10-CM | POA: Diagnosis not present

## 2013-07-10 DIAGNOSIS — D631 Anemia in chronic kidney disease: Secondary | ICD-10-CM | POA: Diagnosis not present

## 2013-07-10 DIAGNOSIS — N186 End stage renal disease: Secondary | ICD-10-CM | POA: Diagnosis not present

## 2013-07-13 DIAGNOSIS — N2581 Secondary hyperparathyroidism of renal origin: Secondary | ICD-10-CM | POA: Diagnosis not present

## 2013-07-13 DIAGNOSIS — N039 Chronic nephritic syndrome with unspecified morphologic changes: Secondary | ICD-10-CM | POA: Diagnosis not present

## 2013-07-13 DIAGNOSIS — N186 End stage renal disease: Secondary | ICD-10-CM | POA: Diagnosis not present

## 2013-07-13 DIAGNOSIS — D631 Anemia in chronic kidney disease: Secondary | ICD-10-CM | POA: Diagnosis not present

## 2013-07-15 DIAGNOSIS — N186 End stage renal disease: Secondary | ICD-10-CM | POA: Diagnosis not present

## 2013-07-15 DIAGNOSIS — N2581 Secondary hyperparathyroidism of renal origin: Secondary | ICD-10-CM | POA: Diagnosis not present

## 2013-07-15 DIAGNOSIS — D631 Anemia in chronic kidney disease: Secondary | ICD-10-CM | POA: Diagnosis not present

## 2013-07-17 DIAGNOSIS — N2581 Secondary hyperparathyroidism of renal origin: Secondary | ICD-10-CM | POA: Diagnosis not present

## 2013-07-17 DIAGNOSIS — D631 Anemia in chronic kidney disease: Secondary | ICD-10-CM | POA: Diagnosis not present

## 2013-07-17 DIAGNOSIS — N039 Chronic nephritic syndrome with unspecified morphologic changes: Secondary | ICD-10-CM | POA: Diagnosis not present

## 2013-07-17 DIAGNOSIS — N186 End stage renal disease: Secondary | ICD-10-CM | POA: Diagnosis not present

## 2013-07-20 DIAGNOSIS — N186 End stage renal disease: Secondary | ICD-10-CM | POA: Diagnosis not present

## 2013-07-20 DIAGNOSIS — N2581 Secondary hyperparathyroidism of renal origin: Secondary | ICD-10-CM | POA: Diagnosis not present

## 2013-07-20 DIAGNOSIS — D631 Anemia in chronic kidney disease: Secondary | ICD-10-CM | POA: Diagnosis not present

## 2013-07-22 DIAGNOSIS — N186 End stage renal disease: Secondary | ICD-10-CM | POA: Diagnosis not present

## 2013-07-22 DIAGNOSIS — D631 Anemia in chronic kidney disease: Secondary | ICD-10-CM | POA: Diagnosis not present

## 2013-07-22 DIAGNOSIS — N2581 Secondary hyperparathyroidism of renal origin: Secondary | ICD-10-CM | POA: Diagnosis not present

## 2013-07-22 DIAGNOSIS — N039 Chronic nephritic syndrome with unspecified morphologic changes: Secondary | ICD-10-CM | POA: Diagnosis not present

## 2013-07-24 DIAGNOSIS — D631 Anemia in chronic kidney disease: Secondary | ICD-10-CM | POA: Diagnosis not present

## 2013-07-24 DIAGNOSIS — N2581 Secondary hyperparathyroidism of renal origin: Secondary | ICD-10-CM | POA: Diagnosis not present

## 2013-07-24 DIAGNOSIS — N186 End stage renal disease: Secondary | ICD-10-CM | POA: Diagnosis not present

## 2013-07-27 DIAGNOSIS — N186 End stage renal disease: Secondary | ICD-10-CM | POA: Diagnosis not present

## 2013-07-27 DIAGNOSIS — N2581 Secondary hyperparathyroidism of renal origin: Secondary | ICD-10-CM | POA: Diagnosis not present

## 2013-07-27 DIAGNOSIS — D631 Anemia in chronic kidney disease: Secondary | ICD-10-CM | POA: Diagnosis not present

## 2013-07-29 DIAGNOSIS — N186 End stage renal disease: Secondary | ICD-10-CM | POA: Diagnosis not present

## 2013-07-29 DIAGNOSIS — D631 Anemia in chronic kidney disease: Secondary | ICD-10-CM | POA: Diagnosis not present

## 2013-07-29 DIAGNOSIS — N2581 Secondary hyperparathyroidism of renal origin: Secondary | ICD-10-CM | POA: Diagnosis not present

## 2013-07-31 DIAGNOSIS — N039 Chronic nephritic syndrome with unspecified morphologic changes: Secondary | ICD-10-CM | POA: Diagnosis not present

## 2013-07-31 DIAGNOSIS — N2581 Secondary hyperparathyroidism of renal origin: Secondary | ICD-10-CM | POA: Diagnosis not present

## 2013-07-31 DIAGNOSIS — N186 End stage renal disease: Secondary | ICD-10-CM | POA: Diagnosis not present

## 2013-07-31 DIAGNOSIS — D631 Anemia in chronic kidney disease: Secondary | ICD-10-CM | POA: Diagnosis not present

## 2013-08-03 DIAGNOSIS — N259 Disorder resulting from impaired renal tubular function, unspecified: Secondary | ICD-10-CM | POA: Diagnosis not present

## 2013-08-03 DIAGNOSIS — N2581 Secondary hyperparathyroidism of renal origin: Secondary | ICD-10-CM | POA: Diagnosis not present

## 2013-08-03 DIAGNOSIS — F329 Major depressive disorder, single episode, unspecified: Secondary | ICD-10-CM | POA: Diagnosis not present

## 2013-08-03 DIAGNOSIS — F3289 Other specified depressive episodes: Secondary | ICD-10-CM | POA: Diagnosis not present

## 2013-08-03 DIAGNOSIS — N189 Chronic kidney disease, unspecified: Secondary | ICD-10-CM | POA: Diagnosis not present

## 2013-08-03 DIAGNOSIS — N186 End stage renal disease: Secondary | ICD-10-CM | POA: Diagnosis not present

## 2013-08-03 DIAGNOSIS — N039 Chronic nephritic syndrome with unspecified morphologic changes: Secondary | ICD-10-CM | POA: Diagnosis not present

## 2013-08-03 DIAGNOSIS — D631 Anemia in chronic kidney disease: Secondary | ICD-10-CM | POA: Diagnosis not present

## 2013-08-03 DIAGNOSIS — E0590 Thyrotoxicosis, unspecified without thyrotoxic crisis or storm: Secondary | ICD-10-CM | POA: Diagnosis not present

## 2013-08-03 DIAGNOSIS — F3110 Bipolar disorder, current episode manic without psychotic features, unspecified: Secondary | ICD-10-CM | POA: Diagnosis not present

## 2013-08-03 DIAGNOSIS — I10 Essential (primary) hypertension: Secondary | ICD-10-CM | POA: Diagnosis not present

## 2013-08-05 DIAGNOSIS — N186 End stage renal disease: Secondary | ICD-10-CM | POA: Diagnosis not present

## 2013-08-05 DIAGNOSIS — D631 Anemia in chronic kidney disease: Secondary | ICD-10-CM | POA: Diagnosis not present

## 2013-08-05 DIAGNOSIS — N2581 Secondary hyperparathyroidism of renal origin: Secondary | ICD-10-CM | POA: Diagnosis not present

## 2013-08-07 DIAGNOSIS — D631 Anemia in chronic kidney disease: Secondary | ICD-10-CM | POA: Diagnosis not present

## 2013-08-07 DIAGNOSIS — N186 End stage renal disease: Secondary | ICD-10-CM | POA: Diagnosis not present

## 2013-08-07 DIAGNOSIS — N039 Chronic nephritic syndrome with unspecified morphologic changes: Secondary | ICD-10-CM | POA: Diagnosis not present

## 2013-08-07 DIAGNOSIS — N2581 Secondary hyperparathyroidism of renal origin: Secondary | ICD-10-CM | POA: Diagnosis not present

## 2013-08-10 DIAGNOSIS — D631 Anemia in chronic kidney disease: Secondary | ICD-10-CM | POA: Diagnosis not present

## 2013-08-10 DIAGNOSIS — N2581 Secondary hyperparathyroidism of renal origin: Secondary | ICD-10-CM | POA: Diagnosis not present

## 2013-08-10 DIAGNOSIS — N186 End stage renal disease: Secondary | ICD-10-CM | POA: Diagnosis not present

## 2013-08-14 DIAGNOSIS — N2581 Secondary hyperparathyroidism of renal origin: Secondary | ICD-10-CM | POA: Diagnosis not present

## 2013-08-14 DIAGNOSIS — N186 End stage renal disease: Secondary | ICD-10-CM | POA: Diagnosis not present

## 2013-08-14 DIAGNOSIS — D631 Anemia in chronic kidney disease: Secondary | ICD-10-CM | POA: Diagnosis not present

## 2013-08-17 DIAGNOSIS — D631 Anemia in chronic kidney disease: Secondary | ICD-10-CM | POA: Diagnosis not present

## 2013-08-17 DIAGNOSIS — N186 End stage renal disease: Secondary | ICD-10-CM | POA: Diagnosis not present

## 2013-08-17 DIAGNOSIS — N039 Chronic nephritic syndrome with unspecified morphologic changes: Secondary | ICD-10-CM | POA: Diagnosis not present

## 2013-08-17 DIAGNOSIS — N2581 Secondary hyperparathyroidism of renal origin: Secondary | ICD-10-CM | POA: Diagnosis not present

## 2013-08-19 DIAGNOSIS — D631 Anemia in chronic kidney disease: Secondary | ICD-10-CM | POA: Diagnosis not present

## 2013-08-19 DIAGNOSIS — N186 End stage renal disease: Secondary | ICD-10-CM | POA: Diagnosis not present

## 2013-08-19 DIAGNOSIS — N039 Chronic nephritic syndrome with unspecified morphologic changes: Secondary | ICD-10-CM | POA: Diagnosis not present

## 2013-08-19 DIAGNOSIS — N2581 Secondary hyperparathyroidism of renal origin: Secondary | ICD-10-CM | POA: Diagnosis not present

## 2013-08-21 DIAGNOSIS — N039 Chronic nephritic syndrome with unspecified morphologic changes: Secondary | ICD-10-CM | POA: Diagnosis not present

## 2013-08-21 DIAGNOSIS — N2581 Secondary hyperparathyroidism of renal origin: Secondary | ICD-10-CM | POA: Diagnosis not present

## 2013-08-21 DIAGNOSIS — N186 End stage renal disease: Secondary | ICD-10-CM | POA: Diagnosis not present

## 2013-08-21 DIAGNOSIS — D631 Anemia in chronic kidney disease: Secondary | ICD-10-CM | POA: Diagnosis not present

## 2013-08-24 DIAGNOSIS — N186 End stage renal disease: Secondary | ICD-10-CM | POA: Diagnosis not present

## 2013-08-24 DIAGNOSIS — F3174 Bipolar disorder, in full remission, most recent episode manic: Secondary | ICD-10-CM | POA: Diagnosis not present

## 2013-08-24 DIAGNOSIS — D631 Anemia in chronic kidney disease: Secondary | ICD-10-CM | POA: Diagnosis not present

## 2013-08-24 DIAGNOSIS — N2581 Secondary hyperparathyroidism of renal origin: Secondary | ICD-10-CM | POA: Diagnosis not present

## 2013-08-26 DIAGNOSIS — D631 Anemia in chronic kidney disease: Secondary | ICD-10-CM | POA: Diagnosis not present

## 2013-08-26 DIAGNOSIS — N2581 Secondary hyperparathyroidism of renal origin: Secondary | ICD-10-CM | POA: Diagnosis not present

## 2013-08-26 DIAGNOSIS — N186 End stage renal disease: Secondary | ICD-10-CM | POA: Diagnosis not present

## 2013-08-26 DIAGNOSIS — N039 Chronic nephritic syndrome with unspecified morphologic changes: Secondary | ICD-10-CM | POA: Diagnosis not present

## 2013-08-28 DIAGNOSIS — N2581 Secondary hyperparathyroidism of renal origin: Secondary | ICD-10-CM | POA: Diagnosis not present

## 2013-08-28 DIAGNOSIS — D631 Anemia in chronic kidney disease: Secondary | ICD-10-CM | POA: Diagnosis not present

## 2013-08-28 DIAGNOSIS — N186 End stage renal disease: Secondary | ICD-10-CM | POA: Diagnosis not present

## 2013-08-29 DIAGNOSIS — N186 End stage renal disease: Secondary | ICD-10-CM | POA: Diagnosis not present

## 2013-08-31 DIAGNOSIS — N2581 Secondary hyperparathyroidism of renal origin: Secondary | ICD-10-CM | POA: Diagnosis not present

## 2013-08-31 DIAGNOSIS — D631 Anemia in chronic kidney disease: Secondary | ICD-10-CM | POA: Diagnosis not present

## 2013-08-31 DIAGNOSIS — D509 Iron deficiency anemia, unspecified: Secondary | ICD-10-CM | POA: Diagnosis not present

## 2013-08-31 DIAGNOSIS — N186 End stage renal disease: Secondary | ICD-10-CM | POA: Diagnosis not present

## 2013-09-02 DIAGNOSIS — N186 End stage renal disease: Secondary | ICD-10-CM | POA: Diagnosis not present

## 2013-09-02 DIAGNOSIS — D631 Anemia in chronic kidney disease: Secondary | ICD-10-CM | POA: Diagnosis not present

## 2013-09-02 DIAGNOSIS — N039 Chronic nephritic syndrome with unspecified morphologic changes: Secondary | ICD-10-CM | POA: Diagnosis not present

## 2013-09-02 DIAGNOSIS — D509 Iron deficiency anemia, unspecified: Secondary | ICD-10-CM | POA: Diagnosis not present

## 2013-09-02 DIAGNOSIS — N2581 Secondary hyperparathyroidism of renal origin: Secondary | ICD-10-CM | POA: Diagnosis not present

## 2013-09-04 DIAGNOSIS — N039 Chronic nephritic syndrome with unspecified morphologic changes: Secondary | ICD-10-CM | POA: Diagnosis not present

## 2013-09-04 DIAGNOSIS — D631 Anemia in chronic kidney disease: Secondary | ICD-10-CM | POA: Diagnosis not present

## 2013-09-04 DIAGNOSIS — D509 Iron deficiency anemia, unspecified: Secondary | ICD-10-CM | POA: Diagnosis not present

## 2013-09-04 DIAGNOSIS — N2581 Secondary hyperparathyroidism of renal origin: Secondary | ICD-10-CM | POA: Diagnosis not present

## 2013-09-04 DIAGNOSIS — N186 End stage renal disease: Secondary | ICD-10-CM | POA: Diagnosis not present

## 2013-09-07 DIAGNOSIS — N186 End stage renal disease: Secondary | ICD-10-CM | POA: Diagnosis not present

## 2013-09-07 DIAGNOSIS — D631 Anemia in chronic kidney disease: Secondary | ICD-10-CM | POA: Diagnosis not present

## 2013-09-07 DIAGNOSIS — D509 Iron deficiency anemia, unspecified: Secondary | ICD-10-CM | POA: Diagnosis not present

## 2013-09-07 DIAGNOSIS — N2581 Secondary hyperparathyroidism of renal origin: Secondary | ICD-10-CM | POA: Diagnosis not present

## 2013-09-09 DIAGNOSIS — N186 End stage renal disease: Secondary | ICD-10-CM | POA: Diagnosis not present

## 2013-09-09 DIAGNOSIS — D631 Anemia in chronic kidney disease: Secondary | ICD-10-CM | POA: Diagnosis not present

## 2013-09-09 DIAGNOSIS — D509 Iron deficiency anemia, unspecified: Secondary | ICD-10-CM | POA: Diagnosis not present

## 2013-09-09 DIAGNOSIS — N039 Chronic nephritic syndrome with unspecified morphologic changes: Secondary | ICD-10-CM | POA: Diagnosis not present

## 2013-09-09 DIAGNOSIS — N2581 Secondary hyperparathyroidism of renal origin: Secondary | ICD-10-CM | POA: Diagnosis not present

## 2013-09-11 DIAGNOSIS — D509 Iron deficiency anemia, unspecified: Secondary | ICD-10-CM | POA: Diagnosis not present

## 2013-09-11 DIAGNOSIS — D631 Anemia in chronic kidney disease: Secondary | ICD-10-CM | POA: Diagnosis not present

## 2013-09-11 DIAGNOSIS — N186 End stage renal disease: Secondary | ICD-10-CM | POA: Diagnosis not present

## 2013-09-11 DIAGNOSIS — N2581 Secondary hyperparathyroidism of renal origin: Secondary | ICD-10-CM | POA: Diagnosis not present

## 2013-09-14 DIAGNOSIS — N2581 Secondary hyperparathyroidism of renal origin: Secondary | ICD-10-CM | POA: Diagnosis not present

## 2013-09-14 DIAGNOSIS — N039 Chronic nephritic syndrome with unspecified morphologic changes: Secondary | ICD-10-CM | POA: Diagnosis not present

## 2013-09-14 DIAGNOSIS — D509 Iron deficiency anemia, unspecified: Secondary | ICD-10-CM | POA: Diagnosis not present

## 2013-09-14 DIAGNOSIS — D631 Anemia in chronic kidney disease: Secondary | ICD-10-CM | POA: Diagnosis not present

## 2013-09-14 DIAGNOSIS — N186 End stage renal disease: Secondary | ICD-10-CM | POA: Diagnosis not present

## 2013-09-16 DIAGNOSIS — N2581 Secondary hyperparathyroidism of renal origin: Secondary | ICD-10-CM | POA: Diagnosis not present

## 2013-09-16 DIAGNOSIS — D631 Anemia in chronic kidney disease: Secondary | ICD-10-CM | POA: Diagnosis not present

## 2013-09-16 DIAGNOSIS — D509 Iron deficiency anemia, unspecified: Secondary | ICD-10-CM | POA: Diagnosis not present

## 2013-09-16 DIAGNOSIS — N186 End stage renal disease: Secondary | ICD-10-CM | POA: Diagnosis not present

## 2013-09-21 DIAGNOSIS — N2581 Secondary hyperparathyroidism of renal origin: Secondary | ICD-10-CM | POA: Diagnosis not present

## 2013-09-21 DIAGNOSIS — D631 Anemia in chronic kidney disease: Secondary | ICD-10-CM | POA: Diagnosis not present

## 2013-09-21 DIAGNOSIS — D509 Iron deficiency anemia, unspecified: Secondary | ICD-10-CM | POA: Diagnosis not present

## 2013-09-21 DIAGNOSIS — N186 End stage renal disease: Secondary | ICD-10-CM | POA: Diagnosis not present

## 2013-09-23 DIAGNOSIS — N2581 Secondary hyperparathyroidism of renal origin: Secondary | ICD-10-CM | POA: Diagnosis not present

## 2013-09-23 DIAGNOSIS — D631 Anemia in chronic kidney disease: Secondary | ICD-10-CM | POA: Diagnosis not present

## 2013-09-23 DIAGNOSIS — N186 End stage renal disease: Secondary | ICD-10-CM | POA: Diagnosis not present

## 2013-09-23 DIAGNOSIS — D509 Iron deficiency anemia, unspecified: Secondary | ICD-10-CM | POA: Diagnosis not present

## 2013-09-25 DIAGNOSIS — N186 End stage renal disease: Secondary | ICD-10-CM | POA: Diagnosis not present

## 2013-09-25 DIAGNOSIS — D631 Anemia in chronic kidney disease: Secondary | ICD-10-CM | POA: Diagnosis not present

## 2013-09-25 DIAGNOSIS — N2581 Secondary hyperparathyroidism of renal origin: Secondary | ICD-10-CM | POA: Diagnosis not present

## 2013-09-25 DIAGNOSIS — D509 Iron deficiency anemia, unspecified: Secondary | ICD-10-CM | POA: Diagnosis not present

## 2013-09-28 DIAGNOSIS — N186 End stage renal disease: Secondary | ICD-10-CM | POA: Diagnosis not present

## 2013-09-28 DIAGNOSIS — D631 Anemia in chronic kidney disease: Secondary | ICD-10-CM | POA: Diagnosis not present

## 2013-09-28 DIAGNOSIS — D509 Iron deficiency anemia, unspecified: Secondary | ICD-10-CM | POA: Diagnosis not present

## 2013-09-28 DIAGNOSIS — N2581 Secondary hyperparathyroidism of renal origin: Secondary | ICD-10-CM | POA: Diagnosis not present

## 2013-09-28 DIAGNOSIS — N039 Chronic nephritic syndrome with unspecified morphologic changes: Secondary | ICD-10-CM | POA: Diagnosis not present

## 2013-09-29 DIAGNOSIS — N186 End stage renal disease: Secondary | ICD-10-CM | POA: Diagnosis not present

## 2013-09-30 DIAGNOSIS — N2581 Secondary hyperparathyroidism of renal origin: Secondary | ICD-10-CM | POA: Diagnosis not present

## 2013-09-30 DIAGNOSIS — D509 Iron deficiency anemia, unspecified: Secondary | ICD-10-CM | POA: Diagnosis not present

## 2013-09-30 DIAGNOSIS — N186 End stage renal disease: Secondary | ICD-10-CM | POA: Diagnosis not present

## 2013-10-02 DIAGNOSIS — D509 Iron deficiency anemia, unspecified: Secondary | ICD-10-CM | POA: Diagnosis not present

## 2013-10-02 DIAGNOSIS — N2581 Secondary hyperparathyroidism of renal origin: Secondary | ICD-10-CM | POA: Diagnosis not present

## 2013-10-02 DIAGNOSIS — N186 End stage renal disease: Secondary | ICD-10-CM | POA: Diagnosis not present

## 2013-10-05 DIAGNOSIS — N2581 Secondary hyperparathyroidism of renal origin: Secondary | ICD-10-CM | POA: Diagnosis not present

## 2013-10-05 DIAGNOSIS — N186 End stage renal disease: Secondary | ICD-10-CM | POA: Diagnosis not present

## 2013-10-05 DIAGNOSIS — D509 Iron deficiency anemia, unspecified: Secondary | ICD-10-CM | POA: Diagnosis not present

## 2013-10-07 DIAGNOSIS — N2581 Secondary hyperparathyroidism of renal origin: Secondary | ICD-10-CM | POA: Diagnosis not present

## 2013-10-07 DIAGNOSIS — D509 Iron deficiency anemia, unspecified: Secondary | ICD-10-CM | POA: Diagnosis not present

## 2013-10-07 DIAGNOSIS — N186 End stage renal disease: Secondary | ICD-10-CM | POA: Diagnosis not present

## 2013-10-09 DIAGNOSIS — N2581 Secondary hyperparathyroidism of renal origin: Secondary | ICD-10-CM | POA: Diagnosis not present

## 2013-10-09 DIAGNOSIS — D509 Iron deficiency anemia, unspecified: Secondary | ICD-10-CM | POA: Diagnosis not present

## 2013-10-09 DIAGNOSIS — N186 End stage renal disease: Secondary | ICD-10-CM | POA: Diagnosis not present

## 2013-10-12 DIAGNOSIS — N186 End stage renal disease: Secondary | ICD-10-CM | POA: Diagnosis not present

## 2013-10-12 DIAGNOSIS — D509 Iron deficiency anemia, unspecified: Secondary | ICD-10-CM | POA: Diagnosis not present

## 2013-10-12 DIAGNOSIS — N2581 Secondary hyperparathyroidism of renal origin: Secondary | ICD-10-CM | POA: Diagnosis not present

## 2013-10-14 DIAGNOSIS — N2581 Secondary hyperparathyroidism of renal origin: Secondary | ICD-10-CM | POA: Diagnosis not present

## 2013-10-14 DIAGNOSIS — N186 End stage renal disease: Secondary | ICD-10-CM | POA: Diagnosis not present

## 2013-10-14 DIAGNOSIS — D509 Iron deficiency anemia, unspecified: Secondary | ICD-10-CM | POA: Diagnosis not present

## 2013-10-16 DIAGNOSIS — D509 Iron deficiency anemia, unspecified: Secondary | ICD-10-CM | POA: Diagnosis not present

## 2013-10-16 DIAGNOSIS — N186 End stage renal disease: Secondary | ICD-10-CM | POA: Diagnosis not present

## 2013-10-16 DIAGNOSIS — N2581 Secondary hyperparathyroidism of renal origin: Secondary | ICD-10-CM | POA: Diagnosis not present

## 2013-10-19 DIAGNOSIS — D509 Iron deficiency anemia, unspecified: Secondary | ICD-10-CM | POA: Diagnosis not present

## 2013-10-19 DIAGNOSIS — N2581 Secondary hyperparathyroidism of renal origin: Secondary | ICD-10-CM | POA: Diagnosis not present

## 2013-10-19 DIAGNOSIS — N186 End stage renal disease: Secondary | ICD-10-CM | POA: Diagnosis not present

## 2013-10-23 DIAGNOSIS — D509 Iron deficiency anemia, unspecified: Secondary | ICD-10-CM | POA: Diagnosis not present

## 2013-10-23 DIAGNOSIS — N2581 Secondary hyperparathyroidism of renal origin: Secondary | ICD-10-CM | POA: Diagnosis not present

## 2013-10-23 DIAGNOSIS — N186 End stage renal disease: Secondary | ICD-10-CM | POA: Diagnosis not present

## 2013-10-26 DIAGNOSIS — D509 Iron deficiency anemia, unspecified: Secondary | ICD-10-CM | POA: Diagnosis not present

## 2013-10-26 DIAGNOSIS — N2581 Secondary hyperparathyroidism of renal origin: Secondary | ICD-10-CM | POA: Diagnosis not present

## 2013-10-26 DIAGNOSIS — N186 End stage renal disease: Secondary | ICD-10-CM | POA: Diagnosis not present

## 2013-10-27 DIAGNOSIS — Z961 Presence of intraocular lens: Secondary | ICD-10-CM | POA: Diagnosis not present

## 2013-10-27 DIAGNOSIS — H5210 Myopia, unspecified eye: Secondary | ICD-10-CM | POA: Diagnosis not present

## 2013-10-27 DIAGNOSIS — H524 Presbyopia: Secondary | ICD-10-CM | POA: Diagnosis not present

## 2013-10-28 DIAGNOSIS — N2581 Secondary hyperparathyroidism of renal origin: Secondary | ICD-10-CM | POA: Diagnosis not present

## 2013-10-28 DIAGNOSIS — D509 Iron deficiency anemia, unspecified: Secondary | ICD-10-CM | POA: Diagnosis not present

## 2013-10-28 DIAGNOSIS — N186 End stage renal disease: Secondary | ICD-10-CM | POA: Diagnosis not present

## 2013-10-30 DIAGNOSIS — D631 Anemia in chronic kidney disease: Secondary | ICD-10-CM | POA: Diagnosis not present

## 2013-10-30 DIAGNOSIS — N186 End stage renal disease: Secondary | ICD-10-CM | POA: Diagnosis not present

## 2013-10-30 DIAGNOSIS — Z23 Encounter for immunization: Secondary | ICD-10-CM | POA: Diagnosis not present

## 2013-10-30 DIAGNOSIS — D509 Iron deficiency anemia, unspecified: Secondary | ICD-10-CM | POA: Diagnosis not present

## 2013-10-30 DIAGNOSIS — N2581 Secondary hyperparathyroidism of renal origin: Secondary | ICD-10-CM | POA: Diagnosis not present

## 2013-11-02 DIAGNOSIS — N2581 Secondary hyperparathyroidism of renal origin: Secondary | ICD-10-CM | POA: Diagnosis not present

## 2013-11-02 DIAGNOSIS — D509 Iron deficiency anemia, unspecified: Secondary | ICD-10-CM | POA: Diagnosis not present

## 2013-11-02 DIAGNOSIS — Z23 Encounter for immunization: Secondary | ICD-10-CM | POA: Diagnosis not present

## 2013-11-02 DIAGNOSIS — D631 Anemia in chronic kidney disease: Secondary | ICD-10-CM | POA: Diagnosis not present

## 2013-11-02 DIAGNOSIS — N186 End stage renal disease: Secondary | ICD-10-CM | POA: Diagnosis not present

## 2013-11-04 DIAGNOSIS — D509 Iron deficiency anemia, unspecified: Secondary | ICD-10-CM | POA: Diagnosis not present

## 2013-11-04 DIAGNOSIS — N2581 Secondary hyperparathyroidism of renal origin: Secondary | ICD-10-CM | POA: Diagnosis not present

## 2013-11-04 DIAGNOSIS — Z23 Encounter for immunization: Secondary | ICD-10-CM | POA: Diagnosis not present

## 2013-11-04 DIAGNOSIS — N186 End stage renal disease: Secondary | ICD-10-CM | POA: Diagnosis not present

## 2013-11-04 DIAGNOSIS — D631 Anemia in chronic kidney disease: Secondary | ICD-10-CM | POA: Diagnosis not present

## 2013-11-06 DIAGNOSIS — Z23 Encounter for immunization: Secondary | ICD-10-CM | POA: Diagnosis not present

## 2013-11-06 DIAGNOSIS — D631 Anemia in chronic kidney disease: Secondary | ICD-10-CM | POA: Diagnosis not present

## 2013-11-06 DIAGNOSIS — I10 Essential (primary) hypertension: Secondary | ICD-10-CM | POA: Diagnosis not present

## 2013-11-06 DIAGNOSIS — F310 Bipolar disorder, current episode hypomanic: Secondary | ICD-10-CM | POA: Diagnosis not present

## 2013-11-06 DIAGNOSIS — D509 Iron deficiency anemia, unspecified: Secondary | ICD-10-CM | POA: Diagnosis not present

## 2013-11-06 DIAGNOSIS — F329 Major depressive disorder, single episode, unspecified: Secondary | ICD-10-CM | POA: Diagnosis not present

## 2013-11-06 DIAGNOSIS — E0590 Thyrotoxicosis, unspecified without thyrotoxic crisis or storm: Secondary | ICD-10-CM | POA: Diagnosis not present

## 2013-11-06 DIAGNOSIS — N186 End stage renal disease: Secondary | ICD-10-CM | POA: Diagnosis not present

## 2013-11-06 DIAGNOSIS — N259 Disorder resulting from impaired renal tubular function, unspecified: Secondary | ICD-10-CM | POA: Diagnosis not present

## 2013-11-06 DIAGNOSIS — N2581 Secondary hyperparathyroidism of renal origin: Secondary | ICD-10-CM | POA: Diagnosis not present

## 2013-11-09 DIAGNOSIS — Z23 Encounter for immunization: Secondary | ICD-10-CM | POA: Diagnosis not present

## 2013-11-09 DIAGNOSIS — N2581 Secondary hyperparathyroidism of renal origin: Secondary | ICD-10-CM | POA: Diagnosis not present

## 2013-11-09 DIAGNOSIS — N186 End stage renal disease: Secondary | ICD-10-CM | POA: Diagnosis not present

## 2013-11-09 DIAGNOSIS — D631 Anemia in chronic kidney disease: Secondary | ICD-10-CM | POA: Diagnosis not present

## 2013-11-09 DIAGNOSIS — D509 Iron deficiency anemia, unspecified: Secondary | ICD-10-CM | POA: Diagnosis not present

## 2013-11-11 DIAGNOSIS — D509 Iron deficiency anemia, unspecified: Secondary | ICD-10-CM | POA: Diagnosis not present

## 2013-11-11 DIAGNOSIS — N186 End stage renal disease: Secondary | ICD-10-CM | POA: Diagnosis not present

## 2013-11-11 DIAGNOSIS — N2581 Secondary hyperparathyroidism of renal origin: Secondary | ICD-10-CM | POA: Diagnosis not present

## 2013-11-11 DIAGNOSIS — D631 Anemia in chronic kidney disease: Secondary | ICD-10-CM | POA: Diagnosis not present

## 2013-11-11 DIAGNOSIS — Z23 Encounter for immunization: Secondary | ICD-10-CM | POA: Diagnosis not present

## 2013-11-13 DIAGNOSIS — N2581 Secondary hyperparathyroidism of renal origin: Secondary | ICD-10-CM | POA: Diagnosis not present

## 2013-11-13 DIAGNOSIS — N186 End stage renal disease: Secondary | ICD-10-CM | POA: Diagnosis not present

## 2013-11-13 DIAGNOSIS — D631 Anemia in chronic kidney disease: Secondary | ICD-10-CM | POA: Diagnosis not present

## 2013-11-13 DIAGNOSIS — Z23 Encounter for immunization: Secondary | ICD-10-CM | POA: Diagnosis not present

## 2013-11-13 DIAGNOSIS — D509 Iron deficiency anemia, unspecified: Secondary | ICD-10-CM | POA: Diagnosis not present

## 2013-11-16 DIAGNOSIS — N186 End stage renal disease: Secondary | ICD-10-CM | POA: Diagnosis not present

## 2013-11-16 DIAGNOSIS — D509 Iron deficiency anemia, unspecified: Secondary | ICD-10-CM | POA: Diagnosis not present

## 2013-11-16 DIAGNOSIS — N2581 Secondary hyperparathyroidism of renal origin: Secondary | ICD-10-CM | POA: Diagnosis not present

## 2013-11-16 DIAGNOSIS — F3174 Bipolar disorder, in full remission, most recent episode manic: Secondary | ICD-10-CM | POA: Diagnosis not present

## 2013-11-16 DIAGNOSIS — Z23 Encounter for immunization: Secondary | ICD-10-CM | POA: Diagnosis not present

## 2013-11-16 DIAGNOSIS — D631 Anemia in chronic kidney disease: Secondary | ICD-10-CM | POA: Diagnosis not present

## 2013-11-18 DIAGNOSIS — N186 End stage renal disease: Secondary | ICD-10-CM | POA: Diagnosis not present

## 2013-11-18 DIAGNOSIS — Z23 Encounter for immunization: Secondary | ICD-10-CM | POA: Diagnosis not present

## 2013-11-18 DIAGNOSIS — N2581 Secondary hyperparathyroidism of renal origin: Secondary | ICD-10-CM | POA: Diagnosis not present

## 2013-11-18 DIAGNOSIS — D631 Anemia in chronic kidney disease: Secondary | ICD-10-CM | POA: Diagnosis not present

## 2013-11-18 DIAGNOSIS — D509 Iron deficiency anemia, unspecified: Secondary | ICD-10-CM | POA: Diagnosis not present

## 2013-11-20 DIAGNOSIS — N186 End stage renal disease: Secondary | ICD-10-CM | POA: Diagnosis not present

## 2013-11-20 DIAGNOSIS — D631 Anemia in chronic kidney disease: Secondary | ICD-10-CM | POA: Diagnosis not present

## 2013-11-20 DIAGNOSIS — N2581 Secondary hyperparathyroidism of renal origin: Secondary | ICD-10-CM | POA: Diagnosis not present

## 2013-11-20 DIAGNOSIS — Z23 Encounter for immunization: Secondary | ICD-10-CM | POA: Diagnosis not present

## 2013-11-20 DIAGNOSIS — D509 Iron deficiency anemia, unspecified: Secondary | ICD-10-CM | POA: Diagnosis not present

## 2013-11-23 DIAGNOSIS — Z23 Encounter for immunization: Secondary | ICD-10-CM | POA: Diagnosis not present

## 2013-11-23 DIAGNOSIS — N186 End stage renal disease: Secondary | ICD-10-CM | POA: Diagnosis not present

## 2013-11-23 DIAGNOSIS — D631 Anemia in chronic kidney disease: Secondary | ICD-10-CM | POA: Diagnosis not present

## 2013-11-23 DIAGNOSIS — D509 Iron deficiency anemia, unspecified: Secondary | ICD-10-CM | POA: Diagnosis not present

## 2013-11-23 DIAGNOSIS — N2581 Secondary hyperparathyroidism of renal origin: Secondary | ICD-10-CM | POA: Diagnosis not present

## 2013-11-25 DIAGNOSIS — D631 Anemia in chronic kidney disease: Secondary | ICD-10-CM | POA: Diagnosis not present

## 2013-11-25 DIAGNOSIS — N186 End stage renal disease: Secondary | ICD-10-CM | POA: Diagnosis not present

## 2013-11-25 DIAGNOSIS — N2581 Secondary hyperparathyroidism of renal origin: Secondary | ICD-10-CM | POA: Diagnosis not present

## 2013-11-25 DIAGNOSIS — Z23 Encounter for immunization: Secondary | ICD-10-CM | POA: Diagnosis not present

## 2013-11-25 DIAGNOSIS — D509 Iron deficiency anemia, unspecified: Secondary | ICD-10-CM | POA: Diagnosis not present

## 2013-11-27 DIAGNOSIS — D509 Iron deficiency anemia, unspecified: Secondary | ICD-10-CM | POA: Diagnosis not present

## 2013-11-27 DIAGNOSIS — N2581 Secondary hyperparathyroidism of renal origin: Secondary | ICD-10-CM | POA: Diagnosis not present

## 2013-11-27 DIAGNOSIS — Z23 Encounter for immunization: Secondary | ICD-10-CM | POA: Diagnosis not present

## 2013-11-27 DIAGNOSIS — N186 End stage renal disease: Secondary | ICD-10-CM | POA: Diagnosis not present

## 2013-11-27 DIAGNOSIS — D631 Anemia in chronic kidney disease: Secondary | ICD-10-CM | POA: Diagnosis not present

## 2013-11-30 DIAGNOSIS — D509 Iron deficiency anemia, unspecified: Secondary | ICD-10-CM | POA: Diagnosis not present

## 2013-11-30 DIAGNOSIS — D631 Anemia in chronic kidney disease: Secondary | ICD-10-CM | POA: Diagnosis not present

## 2013-11-30 DIAGNOSIS — N2581 Secondary hyperparathyroidism of renal origin: Secondary | ICD-10-CM | POA: Diagnosis not present

## 2013-11-30 DIAGNOSIS — N186 End stage renal disease: Secondary | ICD-10-CM | POA: Diagnosis not present

## 2013-12-02 DIAGNOSIS — D509 Iron deficiency anemia, unspecified: Secondary | ICD-10-CM | POA: Diagnosis not present

## 2013-12-02 DIAGNOSIS — D631 Anemia in chronic kidney disease: Secondary | ICD-10-CM | POA: Diagnosis not present

## 2013-12-02 DIAGNOSIS — N2581 Secondary hyperparathyroidism of renal origin: Secondary | ICD-10-CM | POA: Diagnosis not present

## 2013-12-02 DIAGNOSIS — N186 End stage renal disease: Secondary | ICD-10-CM | POA: Diagnosis not present

## 2013-12-04 DIAGNOSIS — D509 Iron deficiency anemia, unspecified: Secondary | ICD-10-CM | POA: Diagnosis not present

## 2013-12-04 DIAGNOSIS — D631 Anemia in chronic kidney disease: Secondary | ICD-10-CM | POA: Diagnosis not present

## 2013-12-04 DIAGNOSIS — N186 End stage renal disease: Secondary | ICD-10-CM | POA: Diagnosis not present

## 2013-12-04 DIAGNOSIS — N2581 Secondary hyperparathyroidism of renal origin: Secondary | ICD-10-CM | POA: Diagnosis not present

## 2013-12-07 DIAGNOSIS — N2581 Secondary hyperparathyroidism of renal origin: Secondary | ICD-10-CM | POA: Diagnosis not present

## 2013-12-07 DIAGNOSIS — D509 Iron deficiency anemia, unspecified: Secondary | ICD-10-CM | POA: Diagnosis not present

## 2013-12-07 DIAGNOSIS — N186 End stage renal disease: Secondary | ICD-10-CM | POA: Diagnosis not present

## 2013-12-07 DIAGNOSIS — D631 Anemia in chronic kidney disease: Secondary | ICD-10-CM | POA: Diagnosis not present

## 2013-12-08 DIAGNOSIS — E039 Hypothyroidism, unspecified: Secondary | ICD-10-CM | POA: Diagnosis not present

## 2013-12-08 DIAGNOSIS — I10 Essential (primary) hypertension: Secondary | ICD-10-CM | POA: Diagnosis not present

## 2013-12-08 DIAGNOSIS — R011 Cardiac murmur, unspecified: Secondary | ICD-10-CM | POA: Diagnosis not present

## 2013-12-08 DIAGNOSIS — Q649 Congenital malformation of urinary system, unspecified: Secondary | ICD-10-CM | POA: Diagnosis not present

## 2013-12-08 DIAGNOSIS — Z79899 Other long term (current) drug therapy: Secondary | ICD-10-CM | POA: Diagnosis not present

## 2013-12-08 DIAGNOSIS — Z9114 Patient's other noncompliance with medication regimen: Secondary | ICD-10-CM | POA: Diagnosis not present

## 2013-12-08 DIAGNOSIS — Z992 Dependence on renal dialysis: Secondary | ICD-10-CM | POA: Diagnosis not present

## 2013-12-09 DIAGNOSIS — N2581 Secondary hyperparathyroidism of renal origin: Secondary | ICD-10-CM | POA: Diagnosis not present

## 2013-12-09 DIAGNOSIS — D509 Iron deficiency anemia, unspecified: Secondary | ICD-10-CM | POA: Diagnosis not present

## 2013-12-09 DIAGNOSIS — D631 Anemia in chronic kidney disease: Secondary | ICD-10-CM | POA: Diagnosis not present

## 2013-12-09 DIAGNOSIS — N186 End stage renal disease: Secondary | ICD-10-CM | POA: Diagnosis not present

## 2013-12-11 DIAGNOSIS — N186 End stage renal disease: Secondary | ICD-10-CM | POA: Diagnosis not present

## 2013-12-11 DIAGNOSIS — D631 Anemia in chronic kidney disease: Secondary | ICD-10-CM | POA: Diagnosis not present

## 2013-12-11 DIAGNOSIS — N2581 Secondary hyperparathyroidism of renal origin: Secondary | ICD-10-CM | POA: Diagnosis not present

## 2013-12-11 DIAGNOSIS — D509 Iron deficiency anemia, unspecified: Secondary | ICD-10-CM | POA: Diagnosis not present

## 2013-12-14 DIAGNOSIS — N2581 Secondary hyperparathyroidism of renal origin: Secondary | ICD-10-CM | POA: Diagnosis not present

## 2013-12-14 DIAGNOSIS — N186 End stage renal disease: Secondary | ICD-10-CM | POA: Diagnosis not present

## 2013-12-14 DIAGNOSIS — D631 Anemia in chronic kidney disease: Secondary | ICD-10-CM | POA: Diagnosis not present

## 2013-12-14 DIAGNOSIS — D509 Iron deficiency anemia, unspecified: Secondary | ICD-10-CM | POA: Diagnosis not present

## 2013-12-18 DIAGNOSIS — N2581 Secondary hyperparathyroidism of renal origin: Secondary | ICD-10-CM | POA: Diagnosis not present

## 2013-12-18 DIAGNOSIS — D631 Anemia in chronic kidney disease: Secondary | ICD-10-CM | POA: Diagnosis not present

## 2013-12-18 DIAGNOSIS — N186 End stage renal disease: Secondary | ICD-10-CM | POA: Diagnosis not present

## 2013-12-18 DIAGNOSIS — D509 Iron deficiency anemia, unspecified: Secondary | ICD-10-CM | POA: Diagnosis not present

## 2013-12-20 DIAGNOSIS — N2581 Secondary hyperparathyroidism of renal origin: Secondary | ICD-10-CM | POA: Diagnosis not present

## 2013-12-20 DIAGNOSIS — D509 Iron deficiency anemia, unspecified: Secondary | ICD-10-CM | POA: Diagnosis not present

## 2013-12-20 DIAGNOSIS — N186 End stage renal disease: Secondary | ICD-10-CM | POA: Diagnosis not present

## 2013-12-20 DIAGNOSIS — D631 Anemia in chronic kidney disease: Secondary | ICD-10-CM | POA: Diagnosis not present

## 2013-12-22 DIAGNOSIS — N186 End stage renal disease: Secondary | ICD-10-CM | POA: Diagnosis not present

## 2013-12-22 DIAGNOSIS — D509 Iron deficiency anemia, unspecified: Secondary | ICD-10-CM | POA: Diagnosis not present

## 2013-12-22 DIAGNOSIS — D631 Anemia in chronic kidney disease: Secondary | ICD-10-CM | POA: Diagnosis not present

## 2013-12-22 DIAGNOSIS — N2581 Secondary hyperparathyroidism of renal origin: Secondary | ICD-10-CM | POA: Diagnosis not present

## 2013-12-25 DIAGNOSIS — D509 Iron deficiency anemia, unspecified: Secondary | ICD-10-CM | POA: Diagnosis not present

## 2013-12-25 DIAGNOSIS — N2581 Secondary hyperparathyroidism of renal origin: Secondary | ICD-10-CM | POA: Diagnosis not present

## 2013-12-25 DIAGNOSIS — D631 Anemia in chronic kidney disease: Secondary | ICD-10-CM | POA: Diagnosis not present

## 2013-12-25 DIAGNOSIS — N186 End stage renal disease: Secondary | ICD-10-CM | POA: Diagnosis not present

## 2013-12-28 DIAGNOSIS — N2581 Secondary hyperparathyroidism of renal origin: Secondary | ICD-10-CM | POA: Diagnosis not present

## 2013-12-28 DIAGNOSIS — D509 Iron deficiency anemia, unspecified: Secondary | ICD-10-CM | POA: Diagnosis not present

## 2013-12-28 DIAGNOSIS — D631 Anemia in chronic kidney disease: Secondary | ICD-10-CM | POA: Diagnosis not present

## 2013-12-28 DIAGNOSIS — N186 End stage renal disease: Secondary | ICD-10-CM | POA: Diagnosis not present

## 2013-12-30 DIAGNOSIS — D631 Anemia in chronic kidney disease: Secondary | ICD-10-CM | POA: Diagnosis not present

## 2013-12-30 DIAGNOSIS — R311 Benign essential microscopic hematuria: Secondary | ICD-10-CM | POA: Diagnosis not present

## 2013-12-30 DIAGNOSIS — N186 End stage renal disease: Secondary | ICD-10-CM | POA: Diagnosis not present

## 2013-12-30 DIAGNOSIS — N401 Enlarged prostate with lower urinary tract symptoms: Secondary | ICD-10-CM | POA: Diagnosis not present

## 2013-12-30 DIAGNOSIS — N2581 Secondary hyperparathyroidism of renal origin: Secondary | ICD-10-CM | POA: Diagnosis not present

## 2013-12-30 DIAGNOSIS — R3914 Feeling of incomplete bladder emptying: Secondary | ICD-10-CM | POA: Diagnosis not present

## 2014-01-01 DIAGNOSIS — N2581 Secondary hyperparathyroidism of renal origin: Secondary | ICD-10-CM | POA: Diagnosis not present

## 2014-01-01 DIAGNOSIS — D631 Anemia in chronic kidney disease: Secondary | ICD-10-CM | POA: Diagnosis not present

## 2014-01-01 DIAGNOSIS — N186 End stage renal disease: Secondary | ICD-10-CM | POA: Diagnosis not present

## 2014-01-04 DIAGNOSIS — N2581 Secondary hyperparathyroidism of renal origin: Secondary | ICD-10-CM | POA: Diagnosis not present

## 2014-01-04 DIAGNOSIS — D631 Anemia in chronic kidney disease: Secondary | ICD-10-CM | POA: Diagnosis not present

## 2014-01-04 DIAGNOSIS — N186 End stage renal disease: Secondary | ICD-10-CM | POA: Diagnosis not present

## 2014-01-06 DIAGNOSIS — D631 Anemia in chronic kidney disease: Secondary | ICD-10-CM | POA: Diagnosis not present

## 2014-01-06 DIAGNOSIS — N2581 Secondary hyperparathyroidism of renal origin: Secondary | ICD-10-CM | POA: Diagnosis not present

## 2014-01-06 DIAGNOSIS — N186 End stage renal disease: Secondary | ICD-10-CM | POA: Diagnosis not present

## 2014-01-11 DIAGNOSIS — D631 Anemia in chronic kidney disease: Secondary | ICD-10-CM | POA: Diagnosis not present

## 2014-01-11 DIAGNOSIS — N2581 Secondary hyperparathyroidism of renal origin: Secondary | ICD-10-CM | POA: Diagnosis not present

## 2014-01-11 DIAGNOSIS — N186 End stage renal disease: Secondary | ICD-10-CM | POA: Diagnosis not present

## 2014-01-13 DIAGNOSIS — N2581 Secondary hyperparathyroidism of renal origin: Secondary | ICD-10-CM | POA: Diagnosis not present

## 2014-01-13 DIAGNOSIS — N186 End stage renal disease: Secondary | ICD-10-CM | POA: Diagnosis not present

## 2014-01-13 DIAGNOSIS — D631 Anemia in chronic kidney disease: Secondary | ICD-10-CM | POA: Diagnosis not present

## 2014-01-15 DIAGNOSIS — D631 Anemia in chronic kidney disease: Secondary | ICD-10-CM | POA: Diagnosis not present

## 2014-01-15 DIAGNOSIS — N186 End stage renal disease: Secondary | ICD-10-CM | POA: Diagnosis not present

## 2014-01-15 DIAGNOSIS — N2581 Secondary hyperparathyroidism of renal origin: Secondary | ICD-10-CM | POA: Diagnosis not present

## 2014-01-18 DIAGNOSIS — N186 End stage renal disease: Secondary | ICD-10-CM | POA: Diagnosis not present

## 2014-01-18 DIAGNOSIS — D631 Anemia in chronic kidney disease: Secondary | ICD-10-CM | POA: Diagnosis not present

## 2014-01-18 DIAGNOSIS — N2581 Secondary hyperparathyroidism of renal origin: Secondary | ICD-10-CM | POA: Diagnosis not present

## 2014-01-20 DIAGNOSIS — N186 End stage renal disease: Secondary | ICD-10-CM | POA: Diagnosis not present

## 2014-01-20 DIAGNOSIS — N2581 Secondary hyperparathyroidism of renal origin: Secondary | ICD-10-CM | POA: Diagnosis not present

## 2014-01-20 DIAGNOSIS — D631 Anemia in chronic kidney disease: Secondary | ICD-10-CM | POA: Diagnosis not present

## 2014-01-25 DIAGNOSIS — N2581 Secondary hyperparathyroidism of renal origin: Secondary | ICD-10-CM | POA: Diagnosis not present

## 2014-01-25 DIAGNOSIS — N186 End stage renal disease: Secondary | ICD-10-CM | POA: Diagnosis not present

## 2014-01-25 DIAGNOSIS — D631 Anemia in chronic kidney disease: Secondary | ICD-10-CM | POA: Diagnosis not present

## 2014-01-27 DIAGNOSIS — N2581 Secondary hyperparathyroidism of renal origin: Secondary | ICD-10-CM | POA: Diagnosis not present

## 2014-01-27 DIAGNOSIS — D631 Anemia in chronic kidney disease: Secondary | ICD-10-CM | POA: Diagnosis not present

## 2014-01-27 DIAGNOSIS — N186 End stage renal disease: Secondary | ICD-10-CM | POA: Diagnosis not present

## 2014-01-28 DIAGNOSIS — N186 End stage renal disease: Secondary | ICD-10-CM | POA: Diagnosis not present

## 2014-01-28 DIAGNOSIS — R6884 Jaw pain: Secondary | ICD-10-CM | POA: Diagnosis not present

## 2014-02-01 DIAGNOSIS — D631 Anemia in chronic kidney disease: Secondary | ICD-10-CM | POA: Diagnosis not present

## 2014-02-01 DIAGNOSIS — N2581 Secondary hyperparathyroidism of renal origin: Secondary | ICD-10-CM | POA: Diagnosis not present

## 2014-02-01 DIAGNOSIS — D509 Iron deficiency anemia, unspecified: Secondary | ICD-10-CM | POA: Diagnosis not present

## 2014-02-01 DIAGNOSIS — N186 End stage renal disease: Secondary | ICD-10-CM | POA: Diagnosis not present

## 2014-02-03 DIAGNOSIS — N186 End stage renal disease: Secondary | ICD-10-CM | POA: Diagnosis not present

## 2014-02-03 DIAGNOSIS — D509 Iron deficiency anemia, unspecified: Secondary | ICD-10-CM | POA: Diagnosis not present

## 2014-02-03 DIAGNOSIS — D631 Anemia in chronic kidney disease: Secondary | ICD-10-CM | POA: Diagnosis not present

## 2014-02-03 DIAGNOSIS — N2581 Secondary hyperparathyroidism of renal origin: Secondary | ICD-10-CM | POA: Diagnosis not present

## 2014-02-05 DIAGNOSIS — N2581 Secondary hyperparathyroidism of renal origin: Secondary | ICD-10-CM | POA: Diagnosis not present

## 2014-02-05 DIAGNOSIS — N186 End stage renal disease: Secondary | ICD-10-CM | POA: Diagnosis not present

## 2014-02-05 DIAGNOSIS — D631 Anemia in chronic kidney disease: Secondary | ICD-10-CM | POA: Diagnosis not present

## 2014-02-05 DIAGNOSIS — D509 Iron deficiency anemia, unspecified: Secondary | ICD-10-CM | POA: Diagnosis not present

## 2014-02-08 DIAGNOSIS — F329 Major depressive disorder, single episode, unspecified: Secondary | ICD-10-CM | POA: Diagnosis not present

## 2014-02-08 DIAGNOSIS — I10 Essential (primary) hypertension: Secondary | ICD-10-CM | POA: Diagnosis not present

## 2014-02-08 DIAGNOSIS — N259 Disorder resulting from impaired renal tubular function, unspecified: Secondary | ICD-10-CM | POA: Diagnosis not present

## 2014-02-08 DIAGNOSIS — N2581 Secondary hyperparathyroidism of renal origin: Secondary | ICD-10-CM | POA: Diagnosis not present

## 2014-02-08 DIAGNOSIS — D509 Iron deficiency anemia, unspecified: Secondary | ICD-10-CM | POA: Diagnosis not present

## 2014-02-08 DIAGNOSIS — D631 Anemia in chronic kidney disease: Secondary | ICD-10-CM | POA: Diagnosis not present

## 2014-02-08 DIAGNOSIS — N186 End stage renal disease: Secondary | ICD-10-CM | POA: Diagnosis not present

## 2014-02-08 DIAGNOSIS — F319 Bipolar disorder, unspecified: Secondary | ICD-10-CM | POA: Diagnosis not present

## 2014-02-12 DIAGNOSIS — D631 Anemia in chronic kidney disease: Secondary | ICD-10-CM | POA: Diagnosis not present

## 2014-02-12 DIAGNOSIS — N2581 Secondary hyperparathyroidism of renal origin: Secondary | ICD-10-CM | POA: Diagnosis not present

## 2014-02-12 DIAGNOSIS — N186 End stage renal disease: Secondary | ICD-10-CM | POA: Diagnosis not present

## 2014-02-12 DIAGNOSIS — D509 Iron deficiency anemia, unspecified: Secondary | ICD-10-CM | POA: Diagnosis not present

## 2014-02-15 DIAGNOSIS — N2581 Secondary hyperparathyroidism of renal origin: Secondary | ICD-10-CM | POA: Diagnosis not present

## 2014-02-15 DIAGNOSIS — D509 Iron deficiency anemia, unspecified: Secondary | ICD-10-CM | POA: Diagnosis not present

## 2014-02-15 DIAGNOSIS — D631 Anemia in chronic kidney disease: Secondary | ICD-10-CM | POA: Diagnosis not present

## 2014-02-15 DIAGNOSIS — N186 End stage renal disease: Secondary | ICD-10-CM | POA: Diagnosis not present

## 2014-02-17 DIAGNOSIS — N2581 Secondary hyperparathyroidism of renal origin: Secondary | ICD-10-CM | POA: Diagnosis not present

## 2014-02-17 DIAGNOSIS — D509 Iron deficiency anemia, unspecified: Secondary | ICD-10-CM | POA: Diagnosis not present

## 2014-02-17 DIAGNOSIS — D631 Anemia in chronic kidney disease: Secondary | ICD-10-CM | POA: Diagnosis not present

## 2014-02-17 DIAGNOSIS — N186 End stage renal disease: Secondary | ICD-10-CM | POA: Diagnosis not present

## 2014-02-17 DIAGNOSIS — E785 Hyperlipidemia, unspecified: Secondary | ICD-10-CM | POA: Diagnosis not present

## 2014-02-19 DIAGNOSIS — D509 Iron deficiency anemia, unspecified: Secondary | ICD-10-CM | POA: Diagnosis not present

## 2014-02-19 DIAGNOSIS — N186 End stage renal disease: Secondary | ICD-10-CM | POA: Diagnosis not present

## 2014-02-19 DIAGNOSIS — N2581 Secondary hyperparathyroidism of renal origin: Secondary | ICD-10-CM | POA: Diagnosis not present

## 2014-02-19 DIAGNOSIS — D631 Anemia in chronic kidney disease: Secondary | ICD-10-CM | POA: Diagnosis not present

## 2014-02-22 DIAGNOSIS — N186 End stage renal disease: Secondary | ICD-10-CM | POA: Diagnosis not present

## 2014-02-22 DIAGNOSIS — D509 Iron deficiency anemia, unspecified: Secondary | ICD-10-CM | POA: Diagnosis not present

## 2014-02-22 DIAGNOSIS — N2581 Secondary hyperparathyroidism of renal origin: Secondary | ICD-10-CM | POA: Diagnosis not present

## 2014-02-22 DIAGNOSIS — D631 Anemia in chronic kidney disease: Secondary | ICD-10-CM | POA: Diagnosis not present

## 2014-02-26 DIAGNOSIS — N2581 Secondary hyperparathyroidism of renal origin: Secondary | ICD-10-CM | POA: Diagnosis not present

## 2014-02-26 DIAGNOSIS — D631 Anemia in chronic kidney disease: Secondary | ICD-10-CM | POA: Diagnosis not present

## 2014-02-26 DIAGNOSIS — N186 End stage renal disease: Secondary | ICD-10-CM | POA: Diagnosis not present

## 2014-02-26 DIAGNOSIS — D509 Iron deficiency anemia, unspecified: Secondary | ICD-10-CM | POA: Diagnosis not present

## 2014-02-28 DIAGNOSIS — N186 End stage renal disease: Secondary | ICD-10-CM | POA: Diagnosis not present

## 2014-03-01 DIAGNOSIS — N186 End stage renal disease: Secondary | ICD-10-CM | POA: Diagnosis not present

## 2014-03-01 DIAGNOSIS — D509 Iron deficiency anemia, unspecified: Secondary | ICD-10-CM | POA: Diagnosis not present

## 2014-03-01 DIAGNOSIS — N2581 Secondary hyperparathyroidism of renal origin: Secondary | ICD-10-CM | POA: Diagnosis not present

## 2014-03-03 DIAGNOSIS — N2581 Secondary hyperparathyroidism of renal origin: Secondary | ICD-10-CM | POA: Diagnosis not present

## 2014-03-03 DIAGNOSIS — N186 End stage renal disease: Secondary | ICD-10-CM | POA: Diagnosis not present

## 2014-03-03 DIAGNOSIS — D509 Iron deficiency anemia, unspecified: Secondary | ICD-10-CM | POA: Diagnosis not present

## 2014-03-05 DIAGNOSIS — N186 End stage renal disease: Secondary | ICD-10-CM | POA: Diagnosis not present

## 2014-03-05 DIAGNOSIS — N2581 Secondary hyperparathyroidism of renal origin: Secondary | ICD-10-CM | POA: Diagnosis not present

## 2014-03-05 DIAGNOSIS — D509 Iron deficiency anemia, unspecified: Secondary | ICD-10-CM | POA: Diagnosis not present

## 2014-03-08 DIAGNOSIS — N186 End stage renal disease: Secondary | ICD-10-CM | POA: Diagnosis not present

## 2014-03-08 DIAGNOSIS — N2581 Secondary hyperparathyroidism of renal origin: Secondary | ICD-10-CM | POA: Diagnosis not present

## 2014-03-08 DIAGNOSIS — D509 Iron deficiency anemia, unspecified: Secondary | ICD-10-CM | POA: Diagnosis not present

## 2014-03-10 DIAGNOSIS — N186 End stage renal disease: Secondary | ICD-10-CM | POA: Diagnosis not present

## 2014-03-10 DIAGNOSIS — N2581 Secondary hyperparathyroidism of renal origin: Secondary | ICD-10-CM | POA: Diagnosis not present

## 2014-03-10 DIAGNOSIS — D509 Iron deficiency anemia, unspecified: Secondary | ICD-10-CM | POA: Diagnosis not present

## 2014-03-12 DIAGNOSIS — N186 End stage renal disease: Secondary | ICD-10-CM | POA: Diagnosis not present

## 2014-03-12 DIAGNOSIS — D509 Iron deficiency anemia, unspecified: Secondary | ICD-10-CM | POA: Diagnosis not present

## 2014-03-12 DIAGNOSIS — N2581 Secondary hyperparathyroidism of renal origin: Secondary | ICD-10-CM | POA: Diagnosis not present

## 2014-03-15 DIAGNOSIS — F3174 Bipolar disorder, in full remission, most recent episode manic: Secondary | ICD-10-CM | POA: Diagnosis not present

## 2014-03-15 DIAGNOSIS — N2581 Secondary hyperparathyroidism of renal origin: Secondary | ICD-10-CM | POA: Diagnosis not present

## 2014-03-15 DIAGNOSIS — D509 Iron deficiency anemia, unspecified: Secondary | ICD-10-CM | POA: Diagnosis not present

## 2014-03-15 DIAGNOSIS — N186 End stage renal disease: Secondary | ICD-10-CM | POA: Diagnosis not present

## 2014-03-17 DIAGNOSIS — N2581 Secondary hyperparathyroidism of renal origin: Secondary | ICD-10-CM | POA: Diagnosis not present

## 2014-03-17 DIAGNOSIS — D509 Iron deficiency anemia, unspecified: Secondary | ICD-10-CM | POA: Diagnosis not present

## 2014-03-17 DIAGNOSIS — N186 End stage renal disease: Secondary | ICD-10-CM | POA: Diagnosis not present

## 2014-03-18 DIAGNOSIS — N189 Chronic kidney disease, unspecified: Secondary | ICD-10-CM | POA: Diagnosis not present

## 2014-03-19 DIAGNOSIS — Z992 Dependence on renal dialysis: Secondary | ICD-10-CM | POA: Diagnosis not present

## 2014-03-19 DIAGNOSIS — I120 Hypertensive chronic kidney disease with stage 5 chronic kidney disease or end stage renal disease: Secondary | ICD-10-CM | POA: Diagnosis not present

## 2014-03-19 DIAGNOSIS — E039 Hypothyroidism, unspecified: Secondary | ICD-10-CM | POA: Diagnosis not present

## 2014-03-19 DIAGNOSIS — N2581 Secondary hyperparathyroidism of renal origin: Secondary | ICD-10-CM | POA: Diagnosis not present

## 2014-03-19 DIAGNOSIS — R3919 Other difficulties with micturition: Secondary | ICD-10-CM | POA: Diagnosis not present

## 2014-03-19 DIAGNOSIS — N390 Urinary tract infection, site not specified: Secondary | ICD-10-CM | POA: Diagnosis not present

## 2014-03-19 DIAGNOSIS — R05 Cough: Secondary | ICD-10-CM | POA: Diagnosis not present

## 2014-03-19 DIAGNOSIS — D509 Iron deficiency anemia, unspecified: Secondary | ICD-10-CM | POA: Diagnosis not present

## 2014-03-19 DIAGNOSIS — N186 End stage renal disease: Secondary | ICD-10-CM | POA: Diagnosis not present

## 2014-03-19 DIAGNOSIS — I517 Cardiomegaly: Secondary | ICD-10-CM | POA: Diagnosis not present

## 2014-03-22 DIAGNOSIS — D509 Iron deficiency anemia, unspecified: Secondary | ICD-10-CM | POA: Diagnosis not present

## 2014-03-22 DIAGNOSIS — N2581 Secondary hyperparathyroidism of renal origin: Secondary | ICD-10-CM | POA: Diagnosis not present

## 2014-03-22 DIAGNOSIS — N186 End stage renal disease: Secondary | ICD-10-CM | POA: Diagnosis not present

## 2014-03-24 DIAGNOSIS — R0600 Dyspnea, unspecified: Secondary | ICD-10-CM | POA: Diagnosis not present

## 2014-03-24 DIAGNOSIS — G479 Sleep disorder, unspecified: Secondary | ICD-10-CM | POA: Diagnosis not present

## 2014-03-26 DIAGNOSIS — N2581 Secondary hyperparathyroidism of renal origin: Secondary | ICD-10-CM | POA: Diagnosis not present

## 2014-03-26 DIAGNOSIS — D509 Iron deficiency anemia, unspecified: Secondary | ICD-10-CM | POA: Diagnosis not present

## 2014-03-26 DIAGNOSIS — N186 End stage renal disease: Secondary | ICD-10-CM | POA: Diagnosis not present

## 2014-03-29 DIAGNOSIS — D509 Iron deficiency anemia, unspecified: Secondary | ICD-10-CM | POA: Diagnosis not present

## 2014-03-29 DIAGNOSIS — N186 End stage renal disease: Secondary | ICD-10-CM | POA: Diagnosis not present

## 2014-03-29 DIAGNOSIS — N2581 Secondary hyperparathyroidism of renal origin: Secondary | ICD-10-CM | POA: Diagnosis not present

## 2014-03-31 DIAGNOSIS — N186 End stage renal disease: Secondary | ICD-10-CM | POA: Diagnosis not present

## 2014-03-31 DIAGNOSIS — N2581 Secondary hyperparathyroidism of renal origin: Secondary | ICD-10-CM | POA: Diagnosis not present

## 2014-04-01 DIAGNOSIS — Z539 Procedure and treatment not carried out, unspecified reason: Secondary | ICD-10-CM | POA: Diagnosis not present

## 2014-04-01 DIAGNOSIS — R079 Chest pain, unspecified: Secondary | ICD-10-CM | POA: Diagnosis not present

## 2014-04-01 DIAGNOSIS — N186 End stage renal disease: Secondary | ICD-10-CM | POA: Diagnosis not present

## 2014-04-02 DIAGNOSIS — N2581 Secondary hyperparathyroidism of renal origin: Secondary | ICD-10-CM | POA: Diagnosis not present

## 2014-04-02 DIAGNOSIS — N186 End stage renal disease: Secondary | ICD-10-CM | POA: Diagnosis not present

## 2014-04-05 DIAGNOSIS — Z539 Procedure and treatment not carried out, unspecified reason: Secondary | ICD-10-CM | POA: Diagnosis not present

## 2014-04-05 DIAGNOSIS — N186 End stage renal disease: Secondary | ICD-10-CM | POA: Diagnosis not present

## 2014-04-06 DIAGNOSIS — N186 End stage renal disease: Secondary | ICD-10-CM | POA: Diagnosis not present

## 2014-04-06 DIAGNOSIS — N2581 Secondary hyperparathyroidism of renal origin: Secondary | ICD-10-CM | POA: Diagnosis not present

## 2014-04-07 DIAGNOSIS — I120 Hypertensive chronic kidney disease with stage 5 chronic kidney disease or end stage renal disease: Secondary | ICD-10-CM | POA: Diagnosis not present

## 2014-04-07 DIAGNOSIS — N19 Unspecified kidney failure: Secondary | ICD-10-CM | POA: Diagnosis not present

## 2014-04-07 DIAGNOSIS — Z01812 Encounter for preprocedural laboratory examination: Secondary | ICD-10-CM | POA: Diagnosis not present

## 2014-04-07 DIAGNOSIS — F319 Bipolar disorder, unspecified: Secondary | ICD-10-CM | POA: Diagnosis not present

## 2014-04-07 DIAGNOSIS — N189 Chronic kidney disease, unspecified: Secondary | ICD-10-CM | POA: Diagnosis not present

## 2014-04-07 DIAGNOSIS — I10 Essential (primary) hypertension: Secondary | ICD-10-CM | POA: Diagnosis not present

## 2014-04-07 DIAGNOSIS — I2510 Atherosclerotic heart disease of native coronary artery without angina pectoris: Secondary | ICD-10-CM | POA: Diagnosis not present

## 2014-04-07 DIAGNOSIS — N186 End stage renal disease: Secondary | ICD-10-CM | POA: Diagnosis not present

## 2014-04-09 DIAGNOSIS — N186 End stage renal disease: Secondary | ICD-10-CM | POA: Diagnosis not present

## 2014-04-09 DIAGNOSIS — N2581 Secondary hyperparathyroidism of renal origin: Secondary | ICD-10-CM | POA: Diagnosis not present

## 2014-04-12 DIAGNOSIS — N2581 Secondary hyperparathyroidism of renal origin: Secondary | ICD-10-CM | POA: Diagnosis not present

## 2014-04-12 DIAGNOSIS — N186 End stage renal disease: Secondary | ICD-10-CM | POA: Diagnosis not present

## 2014-04-13 DIAGNOSIS — K5900 Constipation, unspecified: Secondary | ICD-10-CM | POA: Diagnosis not present

## 2014-04-14 DIAGNOSIS — N186 End stage renal disease: Secondary | ICD-10-CM | POA: Diagnosis not present

## 2014-04-14 DIAGNOSIS — N2581 Secondary hyperparathyroidism of renal origin: Secondary | ICD-10-CM | POA: Diagnosis not present

## 2014-04-16 DIAGNOSIS — N2581 Secondary hyperparathyroidism of renal origin: Secondary | ICD-10-CM | POA: Diagnosis not present

## 2014-04-16 DIAGNOSIS — N186 End stage renal disease: Secondary | ICD-10-CM | POA: Diagnosis not present

## 2014-04-19 DIAGNOSIS — N186 End stage renal disease: Secondary | ICD-10-CM | POA: Diagnosis not present

## 2014-04-19 DIAGNOSIS — N2581 Secondary hyperparathyroidism of renal origin: Secondary | ICD-10-CM | POA: Diagnosis not present

## 2014-04-21 DIAGNOSIS — N186 End stage renal disease: Secondary | ICD-10-CM | POA: Diagnosis not present

## 2014-04-21 DIAGNOSIS — N2581 Secondary hyperparathyroidism of renal origin: Secondary | ICD-10-CM | POA: Diagnosis not present

## 2014-04-23 DIAGNOSIS — N2581 Secondary hyperparathyroidism of renal origin: Secondary | ICD-10-CM | POA: Diagnosis not present

## 2014-04-23 DIAGNOSIS — N186 End stage renal disease: Secondary | ICD-10-CM | POA: Diagnosis not present

## 2014-04-26 DIAGNOSIS — N186 End stage renal disease: Secondary | ICD-10-CM | POA: Diagnosis not present

## 2014-04-26 DIAGNOSIS — N2581 Secondary hyperparathyroidism of renal origin: Secondary | ICD-10-CM | POA: Diagnosis not present

## 2014-04-28 DIAGNOSIS — N2581 Secondary hyperparathyroidism of renal origin: Secondary | ICD-10-CM | POA: Diagnosis not present

## 2014-04-28 DIAGNOSIS — N186 End stage renal disease: Secondary | ICD-10-CM | POA: Diagnosis not present

## 2014-04-30 DIAGNOSIS — N186 End stage renal disease: Secondary | ICD-10-CM | POA: Diagnosis not present

## 2014-04-30 DIAGNOSIS — N2581 Secondary hyperparathyroidism of renal origin: Secondary | ICD-10-CM | POA: Diagnosis not present

## 2014-05-03 DIAGNOSIS — D631 Anemia in chronic kidney disease: Secondary | ICD-10-CM | POA: Diagnosis not present

## 2014-05-03 DIAGNOSIS — Z23 Encounter for immunization: Secondary | ICD-10-CM | POA: Diagnosis not present

## 2014-05-03 DIAGNOSIS — N2581 Secondary hyperparathyroidism of renal origin: Secondary | ICD-10-CM | POA: Diagnosis not present

## 2014-05-03 DIAGNOSIS — N186 End stage renal disease: Secondary | ICD-10-CM | POA: Diagnosis not present

## 2014-05-04 DIAGNOSIS — Z23 Encounter for immunization: Secondary | ICD-10-CM | POA: Diagnosis not present

## 2014-05-04 DIAGNOSIS — D631 Anemia in chronic kidney disease: Secondary | ICD-10-CM | POA: Diagnosis not present

## 2014-05-04 DIAGNOSIS — N186 End stage renal disease: Secondary | ICD-10-CM | POA: Diagnosis not present

## 2014-05-04 DIAGNOSIS — N2581 Secondary hyperparathyroidism of renal origin: Secondary | ICD-10-CM | POA: Diagnosis not present

## 2014-05-05 DIAGNOSIS — N2581 Secondary hyperparathyroidism of renal origin: Secondary | ICD-10-CM | POA: Diagnosis not present

## 2014-05-05 DIAGNOSIS — N186 End stage renal disease: Secondary | ICD-10-CM | POA: Diagnosis not present

## 2014-05-05 DIAGNOSIS — E785 Hyperlipidemia, unspecified: Secondary | ICD-10-CM | POA: Diagnosis not present

## 2014-05-06 DIAGNOSIS — N186 End stage renal disease: Secondary | ICD-10-CM | POA: Diagnosis not present

## 2014-05-06 DIAGNOSIS — Z23 Encounter for immunization: Secondary | ICD-10-CM | POA: Diagnosis not present

## 2014-05-06 DIAGNOSIS — N2581 Secondary hyperparathyroidism of renal origin: Secondary | ICD-10-CM | POA: Diagnosis not present

## 2014-05-06 DIAGNOSIS — D631 Anemia in chronic kidney disease: Secondary | ICD-10-CM | POA: Diagnosis not present

## 2014-05-07 DIAGNOSIS — D631 Anemia in chronic kidney disease: Secondary | ICD-10-CM | POA: Diagnosis not present

## 2014-05-07 DIAGNOSIS — N186 End stage renal disease: Secondary | ICD-10-CM | POA: Diagnosis not present

## 2014-05-07 DIAGNOSIS — Z23 Encounter for immunization: Secondary | ICD-10-CM | POA: Diagnosis not present

## 2014-05-07 DIAGNOSIS — N2581 Secondary hyperparathyroidism of renal origin: Secondary | ICD-10-CM | POA: Diagnosis not present

## 2014-05-08 DIAGNOSIS — N186 End stage renal disease: Secondary | ICD-10-CM | POA: Diagnosis not present

## 2014-05-09 DIAGNOSIS — N186 End stage renal disease: Secondary | ICD-10-CM | POA: Diagnosis not present

## 2014-05-10 DIAGNOSIS — D631 Anemia in chronic kidney disease: Secondary | ICD-10-CM | POA: Diagnosis not present

## 2014-05-10 DIAGNOSIS — N186 End stage renal disease: Secondary | ICD-10-CM | POA: Diagnosis not present

## 2014-05-10 DIAGNOSIS — Z23 Encounter for immunization: Secondary | ICD-10-CM | POA: Diagnosis not present

## 2014-05-10 DIAGNOSIS — F310 Bipolar disorder, current episode hypomanic: Secondary | ICD-10-CM | POA: Diagnosis not present

## 2014-05-10 DIAGNOSIS — F329 Major depressive disorder, single episode, unspecified: Secondary | ICD-10-CM | POA: Diagnosis not present

## 2014-05-10 DIAGNOSIS — I10 Essential (primary) hypertension: Secondary | ICD-10-CM | POA: Diagnosis not present

## 2014-05-10 DIAGNOSIS — S37009A Unspecified injury of unspecified kidney, initial encounter: Secondary | ICD-10-CM | POA: Diagnosis not present

## 2014-05-10 DIAGNOSIS — N2581 Secondary hyperparathyroidism of renal origin: Secondary | ICD-10-CM | POA: Diagnosis not present

## 2014-05-10 DIAGNOSIS — N259 Disorder resulting from impaired renal tubular function, unspecified: Secondary | ICD-10-CM | POA: Diagnosis not present

## 2014-05-11 DIAGNOSIS — N186 End stage renal disease: Secondary | ICD-10-CM | POA: Diagnosis not present

## 2014-05-12 DIAGNOSIS — N186 End stage renal disease: Secondary | ICD-10-CM | POA: Diagnosis not present

## 2014-05-13 DIAGNOSIS — N186 End stage renal disease: Secondary | ICD-10-CM | POA: Diagnosis not present

## 2014-05-14 DIAGNOSIS — N186 End stage renal disease: Secondary | ICD-10-CM | POA: Diagnosis not present

## 2014-05-15 DIAGNOSIS — N186 End stage renal disease: Secondary | ICD-10-CM | POA: Diagnosis not present

## 2014-05-16 DIAGNOSIS — N186 End stage renal disease: Secondary | ICD-10-CM | POA: Diagnosis not present

## 2014-05-17 DIAGNOSIS — N186 End stage renal disease: Secondary | ICD-10-CM | POA: Diagnosis not present

## 2014-05-18 DIAGNOSIS — N186 End stage renal disease: Secondary | ICD-10-CM | POA: Diagnosis not present

## 2014-05-18 DIAGNOSIS — M79672 Pain in left foot: Secondary | ICD-10-CM | POA: Diagnosis not present

## 2014-05-18 DIAGNOSIS — M25572 Pain in left ankle and joints of left foot: Secondary | ICD-10-CM | POA: Diagnosis not present

## 2014-05-18 DIAGNOSIS — G8929 Other chronic pain: Secondary | ICD-10-CM | POA: Diagnosis not present

## 2014-05-19 DIAGNOSIS — N186 End stage renal disease: Secondary | ICD-10-CM | POA: Diagnosis not present

## 2014-05-20 DIAGNOSIS — N186 End stage renal disease: Secondary | ICD-10-CM | POA: Diagnosis not present

## 2014-05-21 DIAGNOSIS — N186 End stage renal disease: Secondary | ICD-10-CM | POA: Diagnosis not present

## 2014-05-22 DIAGNOSIS — N186 End stage renal disease: Secondary | ICD-10-CM | POA: Diagnosis not present

## 2014-05-23 DIAGNOSIS — N186 End stage renal disease: Secondary | ICD-10-CM | POA: Diagnosis not present

## 2014-05-24 DIAGNOSIS — N186 End stage renal disease: Secondary | ICD-10-CM | POA: Diagnosis not present

## 2014-05-25 DIAGNOSIS — N186 End stage renal disease: Secondary | ICD-10-CM | POA: Diagnosis not present

## 2014-05-26 DIAGNOSIS — N186 End stage renal disease: Secondary | ICD-10-CM | POA: Diagnosis not present

## 2014-05-27 DIAGNOSIS — M2012 Hallux valgus (acquired), left foot: Secondary | ICD-10-CM | POA: Diagnosis not present

## 2014-05-27 DIAGNOSIS — N186 End stage renal disease: Secondary | ICD-10-CM | POA: Diagnosis not present

## 2014-05-27 DIAGNOSIS — M79672 Pain in left foot: Secondary | ICD-10-CM | POA: Diagnosis not present

## 2014-05-28 DIAGNOSIS — N186 End stage renal disease: Secondary | ICD-10-CM | POA: Diagnosis not present

## 2014-05-29 DIAGNOSIS — N186 End stage renal disease: Secondary | ICD-10-CM | POA: Diagnosis not present

## 2014-05-30 DIAGNOSIS — N186 End stage renal disease: Secondary | ICD-10-CM | POA: Diagnosis not present

## 2014-05-31 DIAGNOSIS — N186 End stage renal disease: Secondary | ICD-10-CM | POA: Diagnosis not present

## 2014-06-01 DIAGNOSIS — N186 End stage renal disease: Secondary | ICD-10-CM | POA: Diagnosis not present

## 2014-06-02 DIAGNOSIS — N186 End stage renal disease: Secondary | ICD-10-CM | POA: Diagnosis not present

## 2014-06-03 DIAGNOSIS — F3174 Bipolar disorder, in full remission, most recent episode manic: Secondary | ICD-10-CM | POA: Diagnosis not present

## 2014-06-03 DIAGNOSIS — N186 End stage renal disease: Secondary | ICD-10-CM | POA: Diagnosis not present

## 2014-06-04 DIAGNOSIS — N186 End stage renal disease: Secondary | ICD-10-CM | POA: Diagnosis not present

## 2014-06-05 DIAGNOSIS — N186 End stage renal disease: Secondary | ICD-10-CM | POA: Diagnosis not present

## 2014-06-06 DIAGNOSIS — N186 End stage renal disease: Secondary | ICD-10-CM | POA: Diagnosis not present

## 2014-06-07 DIAGNOSIS — N186 End stage renal disease: Secondary | ICD-10-CM | POA: Diagnosis not present

## 2014-06-08 DIAGNOSIS — N186 End stage renal disease: Secondary | ICD-10-CM | POA: Diagnosis not present

## 2014-06-09 DIAGNOSIS — N186 End stage renal disease: Secondary | ICD-10-CM | POA: Diagnosis not present

## 2014-06-10 DIAGNOSIS — N186 End stage renal disease: Secondary | ICD-10-CM | POA: Diagnosis not present

## 2014-06-10 DIAGNOSIS — Z01812 Encounter for preprocedural laboratory examination: Secondary | ICD-10-CM | POA: Diagnosis not present

## 2014-06-10 DIAGNOSIS — M2012 Hallux valgus (acquired), left foot: Secondary | ICD-10-CM | POA: Diagnosis not present

## 2014-06-11 DIAGNOSIS — D631 Anemia in chronic kidney disease: Secondary | ICD-10-CM | POA: Diagnosis not present

## 2014-06-11 DIAGNOSIS — M85872 Other specified disorders of bone density and structure, left ankle and foot: Secondary | ICD-10-CM | POA: Diagnosis not present

## 2014-06-11 DIAGNOSIS — M2012 Hallux valgus (acquired), left foot: Secondary | ICD-10-CM | POA: Diagnosis not present

## 2014-06-11 DIAGNOSIS — N186 End stage renal disease: Secondary | ICD-10-CM | POA: Diagnosis not present

## 2014-06-11 DIAGNOSIS — M205X2 Other deformities of toe(s) (acquired), left foot: Secondary | ICD-10-CM | POA: Diagnosis not present

## 2014-06-11 DIAGNOSIS — Q742 Other congenital malformations of lower limb(s), including pelvic girdle: Secondary | ICD-10-CM | POA: Diagnosis not present

## 2014-06-11 DIAGNOSIS — M216X2 Other acquired deformities of left foot: Secondary | ICD-10-CM | POA: Diagnosis not present

## 2014-06-11 DIAGNOSIS — M79672 Pain in left foot: Secondary | ICD-10-CM | POA: Diagnosis not present

## 2014-06-12 DIAGNOSIS — D631 Anemia in chronic kidney disease: Secondary | ICD-10-CM | POA: Diagnosis not present

## 2014-06-12 DIAGNOSIS — N186 End stage renal disease: Secondary | ICD-10-CM | POA: Diagnosis not present

## 2014-06-13 DIAGNOSIS — D631 Anemia in chronic kidney disease: Secondary | ICD-10-CM | POA: Diagnosis not present

## 2014-06-13 DIAGNOSIS — N186 End stage renal disease: Secondary | ICD-10-CM | POA: Diagnosis not present

## 2014-06-14 DIAGNOSIS — D631 Anemia in chronic kidney disease: Secondary | ICD-10-CM | POA: Diagnosis not present

## 2014-06-14 DIAGNOSIS — N186 End stage renal disease: Secondary | ICD-10-CM | POA: Diagnosis not present

## 2014-06-15 DIAGNOSIS — D631 Anemia in chronic kidney disease: Secondary | ICD-10-CM | POA: Diagnosis not present

## 2014-06-15 DIAGNOSIS — N186 End stage renal disease: Secondary | ICD-10-CM | POA: Diagnosis not present

## 2014-06-16 DIAGNOSIS — D631 Anemia in chronic kidney disease: Secondary | ICD-10-CM | POA: Diagnosis not present

## 2014-06-16 DIAGNOSIS — N186 End stage renal disease: Secondary | ICD-10-CM | POA: Diagnosis not present

## 2014-06-17 DIAGNOSIS — D631 Anemia in chronic kidney disease: Secondary | ICD-10-CM | POA: Diagnosis not present

## 2014-06-17 DIAGNOSIS — N186 End stage renal disease: Secondary | ICD-10-CM | POA: Diagnosis not present

## 2014-06-18 DIAGNOSIS — D631 Anemia in chronic kidney disease: Secondary | ICD-10-CM | POA: Diagnosis not present

## 2014-06-18 DIAGNOSIS — N186 End stage renal disease: Secondary | ICD-10-CM | POA: Diagnosis not present

## 2014-06-19 DIAGNOSIS — N186 End stage renal disease: Secondary | ICD-10-CM | POA: Diagnosis not present

## 2014-06-19 DIAGNOSIS — D631 Anemia in chronic kidney disease: Secondary | ICD-10-CM | POA: Diagnosis not present

## 2014-06-20 DIAGNOSIS — N186 End stage renal disease: Secondary | ICD-10-CM | POA: Diagnosis not present

## 2014-06-20 DIAGNOSIS — D631 Anemia in chronic kidney disease: Secondary | ICD-10-CM | POA: Diagnosis not present

## 2014-06-21 DIAGNOSIS — N186 End stage renal disease: Secondary | ICD-10-CM | POA: Diagnosis not present

## 2014-06-21 DIAGNOSIS — D631 Anemia in chronic kidney disease: Secondary | ICD-10-CM | POA: Diagnosis not present

## 2014-06-22 DIAGNOSIS — D631 Anemia in chronic kidney disease: Secondary | ICD-10-CM | POA: Diagnosis not present

## 2014-06-22 DIAGNOSIS — N186 End stage renal disease: Secondary | ICD-10-CM | POA: Diagnosis not present

## 2014-06-23 DIAGNOSIS — N186 End stage renal disease: Secondary | ICD-10-CM | POA: Diagnosis not present

## 2014-06-23 DIAGNOSIS — D631 Anemia in chronic kidney disease: Secondary | ICD-10-CM | POA: Diagnosis not present

## 2014-06-24 DIAGNOSIS — D631 Anemia in chronic kidney disease: Secondary | ICD-10-CM | POA: Diagnosis not present

## 2014-06-24 DIAGNOSIS — N186 End stage renal disease: Secondary | ICD-10-CM | POA: Diagnosis not present

## 2014-06-25 DIAGNOSIS — D631 Anemia in chronic kidney disease: Secondary | ICD-10-CM | POA: Diagnosis not present

## 2014-06-25 DIAGNOSIS — N186 End stage renal disease: Secondary | ICD-10-CM | POA: Diagnosis not present

## 2014-06-26 DIAGNOSIS — D631 Anemia in chronic kidney disease: Secondary | ICD-10-CM | POA: Diagnosis not present

## 2014-06-26 DIAGNOSIS — N186 End stage renal disease: Secondary | ICD-10-CM | POA: Diagnosis not present

## 2014-06-27 DIAGNOSIS — N186 End stage renal disease: Secondary | ICD-10-CM | POA: Diagnosis not present

## 2014-06-27 DIAGNOSIS — D631 Anemia in chronic kidney disease: Secondary | ICD-10-CM | POA: Diagnosis not present

## 2014-06-28 DIAGNOSIS — N186 End stage renal disease: Secondary | ICD-10-CM | POA: Diagnosis not present

## 2014-06-28 DIAGNOSIS — D631 Anemia in chronic kidney disease: Secondary | ICD-10-CM | POA: Diagnosis not present

## 2014-06-29 DIAGNOSIS — D631 Anemia in chronic kidney disease: Secondary | ICD-10-CM | POA: Diagnosis not present

## 2014-06-29 DIAGNOSIS — N186 End stage renal disease: Secondary | ICD-10-CM | POA: Diagnosis not present

## 2014-06-30 DIAGNOSIS — D631 Anemia in chronic kidney disease: Secondary | ICD-10-CM | POA: Diagnosis not present

## 2014-06-30 DIAGNOSIS — N186 End stage renal disease: Secondary | ICD-10-CM | POA: Diagnosis not present

## 2014-07-01 DIAGNOSIS — D631 Anemia in chronic kidney disease: Secondary | ICD-10-CM | POA: Diagnosis not present

## 2014-07-01 DIAGNOSIS — N186 End stage renal disease: Secondary | ICD-10-CM | POA: Diagnosis not present

## 2014-07-02 DIAGNOSIS — N186 End stage renal disease: Secondary | ICD-10-CM | POA: Diagnosis not present

## 2014-07-02 DIAGNOSIS — D631 Anemia in chronic kidney disease: Secondary | ICD-10-CM | POA: Diagnosis not present

## 2014-07-03 DIAGNOSIS — N186 End stage renal disease: Secondary | ICD-10-CM | POA: Diagnosis not present

## 2014-07-03 DIAGNOSIS — D631 Anemia in chronic kidney disease: Secondary | ICD-10-CM | POA: Diagnosis not present

## 2014-07-04 DIAGNOSIS — D631 Anemia in chronic kidney disease: Secondary | ICD-10-CM | POA: Diagnosis not present

## 2014-07-04 DIAGNOSIS — N186 End stage renal disease: Secondary | ICD-10-CM | POA: Diagnosis not present

## 2014-07-05 DIAGNOSIS — N186 End stage renal disease: Secondary | ICD-10-CM | POA: Diagnosis not present

## 2014-07-05 DIAGNOSIS — D631 Anemia in chronic kidney disease: Secondary | ICD-10-CM | POA: Diagnosis not present

## 2014-07-06 DIAGNOSIS — N186 End stage renal disease: Secondary | ICD-10-CM | POA: Diagnosis not present

## 2014-07-06 DIAGNOSIS — D631 Anemia in chronic kidney disease: Secondary | ICD-10-CM | POA: Diagnosis not present

## 2014-07-07 DIAGNOSIS — D631 Anemia in chronic kidney disease: Secondary | ICD-10-CM | POA: Diagnosis not present

## 2014-07-07 DIAGNOSIS — N186 End stage renal disease: Secondary | ICD-10-CM | POA: Diagnosis not present

## 2014-07-08 DIAGNOSIS — D631 Anemia in chronic kidney disease: Secondary | ICD-10-CM | POA: Diagnosis not present

## 2014-07-08 DIAGNOSIS — N186 End stage renal disease: Secondary | ICD-10-CM | POA: Diagnosis not present

## 2014-07-09 DIAGNOSIS — D631 Anemia in chronic kidney disease: Secondary | ICD-10-CM | POA: Diagnosis not present

## 2014-07-09 DIAGNOSIS — N186 End stage renal disease: Secondary | ICD-10-CM | POA: Diagnosis not present

## 2014-07-10 DIAGNOSIS — N186 End stage renal disease: Secondary | ICD-10-CM | POA: Diagnosis not present

## 2014-07-10 DIAGNOSIS — D631 Anemia in chronic kidney disease: Secondary | ICD-10-CM | POA: Diagnosis not present

## 2014-07-11 DIAGNOSIS — N186 End stage renal disease: Secondary | ICD-10-CM | POA: Diagnosis not present

## 2014-07-11 DIAGNOSIS — D631 Anemia in chronic kidney disease: Secondary | ICD-10-CM | POA: Diagnosis not present

## 2014-07-12 DIAGNOSIS — N186 End stage renal disease: Secondary | ICD-10-CM | POA: Diagnosis not present

## 2014-07-12 DIAGNOSIS — D631 Anemia in chronic kidney disease: Secondary | ICD-10-CM | POA: Diagnosis not present

## 2014-07-13 DIAGNOSIS — D631 Anemia in chronic kidney disease: Secondary | ICD-10-CM | POA: Diagnosis not present

## 2014-07-13 DIAGNOSIS — N186 End stage renal disease: Secondary | ICD-10-CM | POA: Diagnosis not present

## 2014-07-14 DIAGNOSIS — N186 End stage renal disease: Secondary | ICD-10-CM | POA: Diagnosis not present

## 2014-07-14 DIAGNOSIS — D631 Anemia in chronic kidney disease: Secondary | ICD-10-CM | POA: Diagnosis not present

## 2014-07-15 DIAGNOSIS — D631 Anemia in chronic kidney disease: Secondary | ICD-10-CM | POA: Diagnosis not present

## 2014-07-15 DIAGNOSIS — N186 End stage renal disease: Secondary | ICD-10-CM | POA: Diagnosis not present

## 2014-07-16 DIAGNOSIS — N186 End stage renal disease: Secondary | ICD-10-CM | POA: Diagnosis not present

## 2014-07-16 DIAGNOSIS — D631 Anemia in chronic kidney disease: Secondary | ICD-10-CM | POA: Diagnosis not present

## 2014-07-17 DIAGNOSIS — N186 End stage renal disease: Secondary | ICD-10-CM | POA: Diagnosis not present

## 2014-07-17 DIAGNOSIS — D631 Anemia in chronic kidney disease: Secondary | ICD-10-CM | POA: Diagnosis not present

## 2014-07-18 DIAGNOSIS — N186 End stage renal disease: Secondary | ICD-10-CM | POA: Diagnosis not present

## 2014-07-18 DIAGNOSIS — D631 Anemia in chronic kidney disease: Secondary | ICD-10-CM | POA: Diagnosis not present

## 2014-07-19 DIAGNOSIS — D631 Anemia in chronic kidney disease: Secondary | ICD-10-CM | POA: Diagnosis not present

## 2014-07-19 DIAGNOSIS — N186 End stage renal disease: Secondary | ICD-10-CM | POA: Diagnosis not present

## 2014-07-20 DIAGNOSIS — N186 End stage renal disease: Secondary | ICD-10-CM | POA: Diagnosis not present

## 2014-07-20 DIAGNOSIS — D631 Anemia in chronic kidney disease: Secondary | ICD-10-CM | POA: Diagnosis not present

## 2014-07-21 DIAGNOSIS — N186 End stage renal disease: Secondary | ICD-10-CM | POA: Diagnosis not present

## 2014-07-21 DIAGNOSIS — D631 Anemia in chronic kidney disease: Secondary | ICD-10-CM | POA: Diagnosis not present

## 2014-07-22 DIAGNOSIS — D631 Anemia in chronic kidney disease: Secondary | ICD-10-CM | POA: Diagnosis not present

## 2014-07-22 DIAGNOSIS — N186 End stage renal disease: Secondary | ICD-10-CM | POA: Diagnosis not present

## 2014-07-23 DIAGNOSIS — N186 End stage renal disease: Secondary | ICD-10-CM | POA: Diagnosis not present

## 2014-07-23 DIAGNOSIS — D631 Anemia in chronic kidney disease: Secondary | ICD-10-CM | POA: Diagnosis not present

## 2014-07-24 DIAGNOSIS — D631 Anemia in chronic kidney disease: Secondary | ICD-10-CM | POA: Diagnosis not present

## 2014-07-24 DIAGNOSIS — N186 End stage renal disease: Secondary | ICD-10-CM | POA: Diagnosis not present

## 2014-07-25 DIAGNOSIS — N186 End stage renal disease: Secondary | ICD-10-CM | POA: Diagnosis not present

## 2014-07-25 DIAGNOSIS — D631 Anemia in chronic kidney disease: Secondary | ICD-10-CM | POA: Diagnosis not present

## 2014-07-26 DIAGNOSIS — N186 End stage renal disease: Secondary | ICD-10-CM | POA: Diagnosis not present

## 2014-07-26 DIAGNOSIS — D631 Anemia in chronic kidney disease: Secondary | ICD-10-CM | POA: Diagnosis not present

## 2014-07-27 DIAGNOSIS — D631 Anemia in chronic kidney disease: Secondary | ICD-10-CM | POA: Diagnosis not present

## 2014-07-27 DIAGNOSIS — N186 End stage renal disease: Secondary | ICD-10-CM | POA: Diagnosis not present

## 2014-07-28 DIAGNOSIS — N186 End stage renal disease: Secondary | ICD-10-CM | POA: Diagnosis not present

## 2014-07-28 DIAGNOSIS — D631 Anemia in chronic kidney disease: Secondary | ICD-10-CM | POA: Diagnosis not present

## 2014-07-29 DIAGNOSIS — N186 End stage renal disease: Secondary | ICD-10-CM | POA: Diagnosis not present

## 2014-07-29 DIAGNOSIS — D631 Anemia in chronic kidney disease: Secondary | ICD-10-CM | POA: Diagnosis not present

## 2014-07-30 DIAGNOSIS — N186 End stage renal disease: Secondary | ICD-10-CM | POA: Diagnosis not present

## 2014-07-30 DIAGNOSIS — D509 Iron deficiency anemia, unspecified: Secondary | ICD-10-CM | POA: Diagnosis not present

## 2014-07-31 DIAGNOSIS — N186 End stage renal disease: Secondary | ICD-10-CM | POA: Diagnosis not present

## 2014-07-31 DIAGNOSIS — D509 Iron deficiency anemia, unspecified: Secondary | ICD-10-CM | POA: Diagnosis not present

## 2014-08-01 DIAGNOSIS — D509 Iron deficiency anemia, unspecified: Secondary | ICD-10-CM | POA: Diagnosis not present

## 2014-08-01 DIAGNOSIS — N186 End stage renal disease: Secondary | ICD-10-CM | POA: Diagnosis not present

## 2014-08-02 DIAGNOSIS — D509 Iron deficiency anemia, unspecified: Secondary | ICD-10-CM | POA: Diagnosis not present

## 2014-08-02 DIAGNOSIS — N186 End stage renal disease: Secondary | ICD-10-CM | POA: Diagnosis not present

## 2014-08-03 DIAGNOSIS — D509 Iron deficiency anemia, unspecified: Secondary | ICD-10-CM | POA: Diagnosis not present

## 2014-08-03 DIAGNOSIS — N186 End stage renal disease: Secondary | ICD-10-CM | POA: Diagnosis not present

## 2014-08-04 DIAGNOSIS — N186 End stage renal disease: Secondary | ICD-10-CM | POA: Diagnosis not present

## 2014-08-04 DIAGNOSIS — D509 Iron deficiency anemia, unspecified: Secondary | ICD-10-CM | POA: Diagnosis not present

## 2014-08-05 DIAGNOSIS — D509 Iron deficiency anemia, unspecified: Secondary | ICD-10-CM | POA: Diagnosis not present

## 2014-08-05 DIAGNOSIS — N186 End stage renal disease: Secondary | ICD-10-CM | POA: Diagnosis not present

## 2014-08-06 DIAGNOSIS — D509 Iron deficiency anemia, unspecified: Secondary | ICD-10-CM | POA: Diagnosis not present

## 2014-08-06 DIAGNOSIS — N186 End stage renal disease: Secondary | ICD-10-CM | POA: Diagnosis not present

## 2014-08-07 DIAGNOSIS — N186 End stage renal disease: Secondary | ICD-10-CM | POA: Diagnosis not present

## 2014-08-07 DIAGNOSIS — D509 Iron deficiency anemia, unspecified: Secondary | ICD-10-CM | POA: Diagnosis not present

## 2014-08-08 DIAGNOSIS — N186 End stage renal disease: Secondary | ICD-10-CM | POA: Diagnosis not present

## 2014-08-08 DIAGNOSIS — D509 Iron deficiency anemia, unspecified: Secondary | ICD-10-CM | POA: Diagnosis not present

## 2014-08-08 DIAGNOSIS — I10 Essential (primary) hypertension: Secondary | ICD-10-CM | POA: Diagnosis not present

## 2014-08-09 DIAGNOSIS — D509 Iron deficiency anemia, unspecified: Secondary | ICD-10-CM | POA: Diagnosis not present

## 2014-08-09 DIAGNOSIS — N186 End stage renal disease: Secondary | ICD-10-CM | POA: Diagnosis not present

## 2014-08-10 DIAGNOSIS — N186 End stage renal disease: Secondary | ICD-10-CM | POA: Diagnosis not present

## 2014-08-10 DIAGNOSIS — D509 Iron deficiency anemia, unspecified: Secondary | ICD-10-CM | POA: Diagnosis not present

## 2014-08-11 DIAGNOSIS — N186 End stage renal disease: Secondary | ICD-10-CM | POA: Diagnosis not present

## 2014-08-11 DIAGNOSIS — D509 Iron deficiency anemia, unspecified: Secondary | ICD-10-CM | POA: Diagnosis not present

## 2014-08-12 DIAGNOSIS — D509 Iron deficiency anemia, unspecified: Secondary | ICD-10-CM | POA: Diagnosis not present

## 2014-08-12 DIAGNOSIS — N186 End stage renal disease: Secondary | ICD-10-CM | POA: Diagnosis not present

## 2014-08-13 DIAGNOSIS — N186 End stage renal disease: Secondary | ICD-10-CM | POA: Diagnosis not present

## 2014-08-13 DIAGNOSIS — D509 Iron deficiency anemia, unspecified: Secondary | ICD-10-CM | POA: Diagnosis not present

## 2014-08-14 DIAGNOSIS — D509 Iron deficiency anemia, unspecified: Secondary | ICD-10-CM | POA: Diagnosis not present

## 2014-08-14 DIAGNOSIS — N186 End stage renal disease: Secondary | ICD-10-CM | POA: Diagnosis not present

## 2014-08-15 DIAGNOSIS — N186 End stage renal disease: Secondary | ICD-10-CM | POA: Diagnosis not present

## 2014-08-15 DIAGNOSIS — D509 Iron deficiency anemia, unspecified: Secondary | ICD-10-CM | POA: Diagnosis not present

## 2014-08-16 DIAGNOSIS — D509 Iron deficiency anemia, unspecified: Secondary | ICD-10-CM | POA: Diagnosis not present

## 2014-08-16 DIAGNOSIS — N186 End stage renal disease: Secondary | ICD-10-CM | POA: Diagnosis not present

## 2014-08-17 DIAGNOSIS — D509 Iron deficiency anemia, unspecified: Secondary | ICD-10-CM | POA: Diagnosis not present

## 2014-08-17 DIAGNOSIS — N186 End stage renal disease: Secondary | ICD-10-CM | POA: Diagnosis not present

## 2014-08-18 DIAGNOSIS — D509 Iron deficiency anemia, unspecified: Secondary | ICD-10-CM | POA: Diagnosis not present

## 2014-08-18 DIAGNOSIS — N186 End stage renal disease: Secondary | ICD-10-CM | POA: Diagnosis not present

## 2014-08-19 DIAGNOSIS — N186 End stage renal disease: Secondary | ICD-10-CM | POA: Diagnosis not present

## 2014-08-19 DIAGNOSIS — D509 Iron deficiency anemia, unspecified: Secondary | ICD-10-CM | POA: Diagnosis not present

## 2014-08-20 DIAGNOSIS — N186 End stage renal disease: Secondary | ICD-10-CM | POA: Diagnosis not present

## 2014-08-20 DIAGNOSIS — D509 Iron deficiency anemia, unspecified: Secondary | ICD-10-CM | POA: Diagnosis not present

## 2014-08-21 DIAGNOSIS — D509 Iron deficiency anemia, unspecified: Secondary | ICD-10-CM | POA: Diagnosis not present

## 2014-08-21 DIAGNOSIS — N186 End stage renal disease: Secondary | ICD-10-CM | POA: Diagnosis not present

## 2014-08-22 DIAGNOSIS — N186 End stage renal disease: Secondary | ICD-10-CM | POA: Diagnosis not present

## 2014-08-22 DIAGNOSIS — D509 Iron deficiency anemia, unspecified: Secondary | ICD-10-CM | POA: Diagnosis not present

## 2014-08-23 DIAGNOSIS — N186 End stage renal disease: Secondary | ICD-10-CM | POA: Diagnosis not present

## 2014-08-23 DIAGNOSIS — D509 Iron deficiency anemia, unspecified: Secondary | ICD-10-CM | POA: Diagnosis not present

## 2014-08-24 DIAGNOSIS — D509 Iron deficiency anemia, unspecified: Secondary | ICD-10-CM | POA: Diagnosis not present

## 2014-08-24 DIAGNOSIS — N186 End stage renal disease: Secondary | ICD-10-CM | POA: Diagnosis not present

## 2014-08-25 DIAGNOSIS — D509 Iron deficiency anemia, unspecified: Secondary | ICD-10-CM | POA: Diagnosis not present

## 2014-08-25 DIAGNOSIS — N186 End stage renal disease: Secondary | ICD-10-CM | POA: Diagnosis not present

## 2014-08-26 DIAGNOSIS — D509 Iron deficiency anemia, unspecified: Secondary | ICD-10-CM | POA: Diagnosis not present

## 2014-08-26 DIAGNOSIS — N186 End stage renal disease: Secondary | ICD-10-CM | POA: Diagnosis not present

## 2014-08-27 DIAGNOSIS — D509 Iron deficiency anemia, unspecified: Secondary | ICD-10-CM | POA: Diagnosis not present

## 2014-08-27 DIAGNOSIS — N186 End stage renal disease: Secondary | ICD-10-CM | POA: Diagnosis not present

## 2014-08-28 DIAGNOSIS — D509 Iron deficiency anemia, unspecified: Secondary | ICD-10-CM | POA: Diagnosis not present

## 2014-08-28 DIAGNOSIS — N186 End stage renal disease: Secondary | ICD-10-CM | POA: Diagnosis not present

## 2014-08-29 DIAGNOSIS — D509 Iron deficiency anemia, unspecified: Secondary | ICD-10-CM | POA: Diagnosis not present

## 2014-08-29 DIAGNOSIS — N186 End stage renal disease: Secondary | ICD-10-CM | POA: Diagnosis not present

## 2014-08-30 DIAGNOSIS — N186 End stage renal disease: Secondary | ICD-10-CM | POA: Diagnosis not present

## 2014-08-31 DIAGNOSIS — N186 End stage renal disease: Secondary | ICD-10-CM | POA: Diagnosis not present

## 2014-09-01 DIAGNOSIS — N186 End stage renal disease: Secondary | ICD-10-CM | POA: Diagnosis not present

## 2014-09-02 DIAGNOSIS — N186 End stage renal disease: Secondary | ICD-10-CM | POA: Diagnosis not present

## 2014-09-03 DIAGNOSIS — N186 End stage renal disease: Secondary | ICD-10-CM | POA: Diagnosis not present

## 2014-09-04 DIAGNOSIS — N186 End stage renal disease: Secondary | ICD-10-CM | POA: Diagnosis not present

## 2014-09-05 DIAGNOSIS — N186 End stage renal disease: Secondary | ICD-10-CM | POA: Diagnosis not present

## 2014-09-06 DIAGNOSIS — N186 End stage renal disease: Secondary | ICD-10-CM | POA: Diagnosis not present

## 2014-09-07 DIAGNOSIS — N186 End stage renal disease: Secondary | ICD-10-CM | POA: Diagnosis not present

## 2014-09-08 DIAGNOSIS — N186 End stage renal disease: Secondary | ICD-10-CM | POA: Diagnosis not present

## 2014-09-09 DIAGNOSIS — N186 End stage renal disease: Secondary | ICD-10-CM | POA: Diagnosis not present

## 2014-09-10 DIAGNOSIS — N186 End stage renal disease: Secondary | ICD-10-CM | POA: Diagnosis not present

## 2014-09-11 DIAGNOSIS — N186 End stage renal disease: Secondary | ICD-10-CM | POA: Diagnosis not present

## 2014-09-12 DIAGNOSIS — N186 End stage renal disease: Secondary | ICD-10-CM | POA: Diagnosis not present

## 2014-09-13 DIAGNOSIS — N186 End stage renal disease: Secondary | ICD-10-CM | POA: Diagnosis not present

## 2014-09-14 DIAGNOSIS — N186 End stage renal disease: Secondary | ICD-10-CM | POA: Diagnosis not present

## 2014-09-15 DIAGNOSIS — N186 End stage renal disease: Secondary | ICD-10-CM | POA: Diagnosis not present

## 2014-09-16 DIAGNOSIS — N186 End stage renal disease: Secondary | ICD-10-CM | POA: Diagnosis not present

## 2014-09-17 DIAGNOSIS — N186 End stage renal disease: Secondary | ICD-10-CM | POA: Diagnosis not present

## 2014-09-18 DIAGNOSIS — N186 End stage renal disease: Secondary | ICD-10-CM | POA: Diagnosis not present

## 2014-09-19 DIAGNOSIS — N186 End stage renal disease: Secondary | ICD-10-CM | POA: Diagnosis not present

## 2014-09-20 DIAGNOSIS — N186 End stage renal disease: Secondary | ICD-10-CM | POA: Diagnosis not present

## 2014-09-21 DIAGNOSIS — N186 End stage renal disease: Secondary | ICD-10-CM | POA: Diagnosis not present

## 2014-09-22 DIAGNOSIS — N186 End stage renal disease: Secondary | ICD-10-CM | POA: Diagnosis not present

## 2014-09-23 DIAGNOSIS — N186 End stage renal disease: Secondary | ICD-10-CM | POA: Diagnosis not present

## 2014-09-24 DIAGNOSIS — N186 End stage renal disease: Secondary | ICD-10-CM | POA: Diagnosis not present

## 2014-09-25 DIAGNOSIS — N186 End stage renal disease: Secondary | ICD-10-CM | POA: Diagnosis not present

## 2014-09-26 DIAGNOSIS — N186 End stage renal disease: Secondary | ICD-10-CM | POA: Diagnosis not present

## 2014-09-27 DIAGNOSIS — N186 End stage renal disease: Secondary | ICD-10-CM | POA: Diagnosis not present

## 2014-09-28 DIAGNOSIS — N186 End stage renal disease: Secondary | ICD-10-CM | POA: Diagnosis not present

## 2014-09-29 DIAGNOSIS — N186 End stage renal disease: Secondary | ICD-10-CM | POA: Diagnosis not present

## 2014-09-30 DIAGNOSIS — N186 End stage renal disease: Secondary | ICD-10-CM | POA: Diagnosis not present

## 2014-09-30 DIAGNOSIS — D631 Anemia in chronic kidney disease: Secondary | ICD-10-CM | POA: Diagnosis not present

## 2014-10-01 DIAGNOSIS — D631 Anemia in chronic kidney disease: Secondary | ICD-10-CM | POA: Diagnosis not present

## 2014-10-01 DIAGNOSIS — N186 End stage renal disease: Secondary | ICD-10-CM | POA: Diagnosis not present

## 2014-10-02 DIAGNOSIS — N186 End stage renal disease: Secondary | ICD-10-CM | POA: Diagnosis not present

## 2014-10-02 DIAGNOSIS — D631 Anemia in chronic kidney disease: Secondary | ICD-10-CM | POA: Diagnosis not present

## 2014-10-03 DIAGNOSIS — D631 Anemia in chronic kidney disease: Secondary | ICD-10-CM | POA: Diagnosis not present

## 2014-10-03 DIAGNOSIS — N186 End stage renal disease: Secondary | ICD-10-CM | POA: Diagnosis not present

## 2014-10-04 DIAGNOSIS — N186 End stage renal disease: Secondary | ICD-10-CM | POA: Diagnosis not present

## 2014-10-04 DIAGNOSIS — D631 Anemia in chronic kidney disease: Secondary | ICD-10-CM | POA: Diagnosis not present

## 2014-10-05 DIAGNOSIS — N186 End stage renal disease: Secondary | ICD-10-CM | POA: Diagnosis not present

## 2014-10-05 DIAGNOSIS — D631 Anemia in chronic kidney disease: Secondary | ICD-10-CM | POA: Diagnosis not present

## 2014-10-06 DIAGNOSIS — D631 Anemia in chronic kidney disease: Secondary | ICD-10-CM | POA: Diagnosis not present

## 2014-10-06 DIAGNOSIS — N186 End stage renal disease: Secondary | ICD-10-CM | POA: Diagnosis not present

## 2014-10-07 DIAGNOSIS — N186 End stage renal disease: Secondary | ICD-10-CM | POA: Diagnosis not present

## 2014-10-07 DIAGNOSIS — D631 Anemia in chronic kidney disease: Secondary | ICD-10-CM | POA: Diagnosis not present

## 2014-10-08 DIAGNOSIS — N186 End stage renal disease: Secondary | ICD-10-CM | POA: Diagnosis not present

## 2014-10-08 DIAGNOSIS — D631 Anemia in chronic kidney disease: Secondary | ICD-10-CM | POA: Diagnosis not present

## 2014-10-09 DIAGNOSIS — N186 End stage renal disease: Secondary | ICD-10-CM | POA: Diagnosis not present

## 2014-10-09 DIAGNOSIS — D631 Anemia in chronic kidney disease: Secondary | ICD-10-CM | POA: Diagnosis not present

## 2014-10-10 DIAGNOSIS — D631 Anemia in chronic kidney disease: Secondary | ICD-10-CM | POA: Diagnosis not present

## 2014-10-10 DIAGNOSIS — N186 End stage renal disease: Secondary | ICD-10-CM | POA: Diagnosis not present

## 2014-10-11 DIAGNOSIS — N186 End stage renal disease: Secondary | ICD-10-CM | POA: Diagnosis not present

## 2014-10-11 DIAGNOSIS — D631 Anemia in chronic kidney disease: Secondary | ICD-10-CM | POA: Diagnosis not present

## 2014-10-12 DIAGNOSIS — D631 Anemia in chronic kidney disease: Secondary | ICD-10-CM | POA: Diagnosis not present

## 2014-10-12 DIAGNOSIS — N186 End stage renal disease: Secondary | ICD-10-CM | POA: Diagnosis not present

## 2014-10-13 DIAGNOSIS — N186 End stage renal disease: Secondary | ICD-10-CM | POA: Diagnosis not present

## 2014-10-13 DIAGNOSIS — D631 Anemia in chronic kidney disease: Secondary | ICD-10-CM | POA: Diagnosis not present

## 2014-10-14 DIAGNOSIS — D631 Anemia in chronic kidney disease: Secondary | ICD-10-CM | POA: Diagnosis not present

## 2014-10-14 DIAGNOSIS — N186 End stage renal disease: Secondary | ICD-10-CM | POA: Diagnosis not present

## 2014-10-15 DIAGNOSIS — N186 End stage renal disease: Secondary | ICD-10-CM | POA: Diagnosis not present

## 2014-10-15 DIAGNOSIS — D631 Anemia in chronic kidney disease: Secondary | ICD-10-CM | POA: Diagnosis not present

## 2014-10-16 DIAGNOSIS — D631 Anemia in chronic kidney disease: Secondary | ICD-10-CM | POA: Diagnosis not present

## 2014-10-16 DIAGNOSIS — N186 End stage renal disease: Secondary | ICD-10-CM | POA: Diagnosis not present

## 2014-10-17 DIAGNOSIS — N186 End stage renal disease: Secondary | ICD-10-CM | POA: Diagnosis not present

## 2014-10-17 DIAGNOSIS — D631 Anemia in chronic kidney disease: Secondary | ICD-10-CM | POA: Diagnosis not present

## 2014-10-18 DIAGNOSIS — D631 Anemia in chronic kidney disease: Secondary | ICD-10-CM | POA: Diagnosis not present

## 2014-10-18 DIAGNOSIS — N186 End stage renal disease: Secondary | ICD-10-CM | POA: Diagnosis not present

## 2014-10-19 DIAGNOSIS — D631 Anemia in chronic kidney disease: Secondary | ICD-10-CM | POA: Diagnosis not present

## 2014-10-19 DIAGNOSIS — N186 End stage renal disease: Secondary | ICD-10-CM | POA: Diagnosis not present

## 2014-10-20 DIAGNOSIS — N186 End stage renal disease: Secondary | ICD-10-CM | POA: Diagnosis not present

## 2014-10-20 DIAGNOSIS — D631 Anemia in chronic kidney disease: Secondary | ICD-10-CM | POA: Diagnosis not present

## 2014-10-21 DIAGNOSIS — N186 End stage renal disease: Secondary | ICD-10-CM | POA: Diagnosis not present

## 2014-10-21 DIAGNOSIS — D631 Anemia in chronic kidney disease: Secondary | ICD-10-CM | POA: Diagnosis not present

## 2014-10-22 DIAGNOSIS — D631 Anemia in chronic kidney disease: Secondary | ICD-10-CM | POA: Diagnosis not present

## 2014-10-22 DIAGNOSIS — N186 End stage renal disease: Secondary | ICD-10-CM | POA: Diagnosis not present

## 2014-10-23 DIAGNOSIS — D631 Anemia in chronic kidney disease: Secondary | ICD-10-CM | POA: Diagnosis not present

## 2014-10-23 DIAGNOSIS — N186 End stage renal disease: Secondary | ICD-10-CM | POA: Diagnosis not present

## 2014-10-24 DIAGNOSIS — D631 Anemia in chronic kidney disease: Secondary | ICD-10-CM | POA: Diagnosis not present

## 2014-10-24 DIAGNOSIS — N186 End stage renal disease: Secondary | ICD-10-CM | POA: Diagnosis not present

## 2014-10-25 DIAGNOSIS — D631 Anemia in chronic kidney disease: Secondary | ICD-10-CM | POA: Diagnosis not present

## 2014-10-25 DIAGNOSIS — N186 End stage renal disease: Secondary | ICD-10-CM | POA: Diagnosis not present

## 2014-10-26 DIAGNOSIS — D631 Anemia in chronic kidney disease: Secondary | ICD-10-CM | POA: Diagnosis not present

## 2014-10-26 DIAGNOSIS — N186 End stage renal disease: Secondary | ICD-10-CM | POA: Diagnosis not present

## 2014-10-27 DIAGNOSIS — D631 Anemia in chronic kidney disease: Secondary | ICD-10-CM | POA: Diagnosis not present

## 2014-10-27 DIAGNOSIS — N186 End stage renal disease: Secondary | ICD-10-CM | POA: Diagnosis not present

## 2014-10-28 DIAGNOSIS — D631 Anemia in chronic kidney disease: Secondary | ICD-10-CM | POA: Diagnosis not present

## 2014-10-28 DIAGNOSIS — N186 End stage renal disease: Secondary | ICD-10-CM | POA: Diagnosis not present

## 2014-10-29 DIAGNOSIS — D631 Anemia in chronic kidney disease: Secondary | ICD-10-CM | POA: Diagnosis not present

## 2014-10-29 DIAGNOSIS — N186 End stage renal disease: Secondary | ICD-10-CM | POA: Diagnosis not present

## 2014-10-30 DIAGNOSIS — N186 End stage renal disease: Secondary | ICD-10-CM | POA: Diagnosis not present

## 2014-10-30 DIAGNOSIS — D631 Anemia in chronic kidney disease: Secondary | ICD-10-CM | POA: Diagnosis not present

## 2014-10-31 DIAGNOSIS — D631 Anemia in chronic kidney disease: Secondary | ICD-10-CM | POA: Diagnosis not present

## 2014-10-31 DIAGNOSIS — N186 End stage renal disease: Secondary | ICD-10-CM | POA: Diagnosis not present

## 2014-11-01 DIAGNOSIS — D631 Anemia in chronic kidney disease: Secondary | ICD-10-CM | POA: Diagnosis not present

## 2014-11-01 DIAGNOSIS — N186 End stage renal disease: Secondary | ICD-10-CM | POA: Diagnosis not present

## 2014-11-02 DIAGNOSIS — N186 End stage renal disease: Secondary | ICD-10-CM | POA: Diagnosis not present

## 2014-11-02 DIAGNOSIS — D631 Anemia in chronic kidney disease: Secondary | ICD-10-CM | POA: Diagnosis not present

## 2014-11-03 DIAGNOSIS — N186 End stage renal disease: Secondary | ICD-10-CM | POA: Diagnosis not present

## 2014-11-03 DIAGNOSIS — D631 Anemia in chronic kidney disease: Secondary | ICD-10-CM | POA: Diagnosis not present

## 2014-11-04 DIAGNOSIS — D631 Anemia in chronic kidney disease: Secondary | ICD-10-CM | POA: Diagnosis not present

## 2014-11-04 DIAGNOSIS — H903 Sensorineural hearing loss, bilateral: Secondary | ICD-10-CM | POA: Diagnosis not present

## 2014-11-04 DIAGNOSIS — N186 End stage renal disease: Secondary | ICD-10-CM | POA: Diagnosis not present

## 2014-11-05 DIAGNOSIS — N186 End stage renal disease: Secondary | ICD-10-CM | POA: Diagnosis not present

## 2014-11-05 DIAGNOSIS — D631 Anemia in chronic kidney disease: Secondary | ICD-10-CM | POA: Diagnosis not present

## 2014-11-06 DIAGNOSIS — D631 Anemia in chronic kidney disease: Secondary | ICD-10-CM | POA: Diagnosis not present

## 2014-11-06 DIAGNOSIS — N186 End stage renal disease: Secondary | ICD-10-CM | POA: Diagnosis not present

## 2014-11-07 DIAGNOSIS — D631 Anemia in chronic kidney disease: Secondary | ICD-10-CM | POA: Diagnosis not present

## 2014-11-07 DIAGNOSIS — N186 End stage renal disease: Secondary | ICD-10-CM | POA: Diagnosis not present

## 2014-11-08 DIAGNOSIS — N186 End stage renal disease: Secondary | ICD-10-CM | POA: Diagnosis not present

## 2014-11-08 DIAGNOSIS — D631 Anemia in chronic kidney disease: Secondary | ICD-10-CM | POA: Diagnosis not present

## 2014-11-09 DIAGNOSIS — N186 End stage renal disease: Secondary | ICD-10-CM | POA: Diagnosis not present

## 2014-11-09 DIAGNOSIS — D631 Anemia in chronic kidney disease: Secondary | ICD-10-CM | POA: Diagnosis not present

## 2014-11-10 DIAGNOSIS — N186 End stage renal disease: Secondary | ICD-10-CM | POA: Diagnosis not present

## 2014-11-10 DIAGNOSIS — D631 Anemia in chronic kidney disease: Secondary | ICD-10-CM | POA: Diagnosis not present

## 2014-11-11 DIAGNOSIS — I10 Essential (primary) hypertension: Secondary | ICD-10-CM | POA: Diagnosis not present

## 2014-11-11 DIAGNOSIS — F310 Bipolar disorder, current episode hypomanic: Secondary | ICD-10-CM | POA: Diagnosis not present

## 2014-11-11 DIAGNOSIS — D631 Anemia in chronic kidney disease: Secondary | ICD-10-CM | POA: Diagnosis not present

## 2014-11-11 DIAGNOSIS — F329 Major depressive disorder, single episode, unspecified: Secondary | ICD-10-CM | POA: Diagnosis not present

## 2014-11-11 DIAGNOSIS — N186 End stage renal disease: Secondary | ICD-10-CM | POA: Diagnosis not present

## 2014-11-11 DIAGNOSIS — N259 Disorder resulting from impaired renal tubular function, unspecified: Secondary | ICD-10-CM | POA: Diagnosis not present

## 2014-11-11 DIAGNOSIS — Z23 Encounter for immunization: Secondary | ICD-10-CM | POA: Diagnosis not present

## 2014-11-12 DIAGNOSIS — D631 Anemia in chronic kidney disease: Secondary | ICD-10-CM | POA: Diagnosis not present

## 2014-11-12 DIAGNOSIS — N186 End stage renal disease: Secondary | ICD-10-CM | POA: Diagnosis not present

## 2014-11-13 DIAGNOSIS — N186 End stage renal disease: Secondary | ICD-10-CM | POA: Diagnosis not present

## 2014-11-13 DIAGNOSIS — D631 Anemia in chronic kidney disease: Secondary | ICD-10-CM | POA: Diagnosis not present

## 2014-11-14 DIAGNOSIS — N186 End stage renal disease: Secondary | ICD-10-CM | POA: Diagnosis not present

## 2014-11-14 DIAGNOSIS — D631 Anemia in chronic kidney disease: Secondary | ICD-10-CM | POA: Diagnosis not present

## 2014-11-15 DIAGNOSIS — D631 Anemia in chronic kidney disease: Secondary | ICD-10-CM | POA: Diagnosis not present

## 2014-11-15 DIAGNOSIS — N186 End stage renal disease: Secondary | ICD-10-CM | POA: Diagnosis not present

## 2014-11-16 DIAGNOSIS — N186 End stage renal disease: Secondary | ICD-10-CM | POA: Diagnosis not present

## 2014-11-16 DIAGNOSIS — D631 Anemia in chronic kidney disease: Secondary | ICD-10-CM | POA: Diagnosis not present

## 2014-11-17 DIAGNOSIS — D631 Anemia in chronic kidney disease: Secondary | ICD-10-CM | POA: Diagnosis not present

## 2014-11-17 DIAGNOSIS — N186 End stage renal disease: Secondary | ICD-10-CM | POA: Diagnosis not present

## 2014-11-18 DIAGNOSIS — N186 End stage renal disease: Secondary | ICD-10-CM | POA: Diagnosis not present

## 2014-11-18 DIAGNOSIS — D631 Anemia in chronic kidney disease: Secondary | ICD-10-CM | POA: Diagnosis not present

## 2014-11-18 DIAGNOSIS — F3174 Bipolar disorder, in full remission, most recent episode manic: Secondary | ICD-10-CM | POA: Diagnosis not present

## 2014-11-19 DIAGNOSIS — N186 End stage renal disease: Secondary | ICD-10-CM | POA: Diagnosis not present

## 2014-11-19 DIAGNOSIS — D631 Anemia in chronic kidney disease: Secondary | ICD-10-CM | POA: Diagnosis not present

## 2014-11-19 DIAGNOSIS — R293 Abnormal posture: Secondary | ICD-10-CM | POA: Diagnosis not present

## 2014-11-19 DIAGNOSIS — M2560 Stiffness of unspecified joint, not elsewhere classified: Secondary | ICD-10-CM | POA: Diagnosis not present

## 2014-11-19 DIAGNOSIS — R269 Unspecified abnormalities of gait and mobility: Secondary | ICD-10-CM | POA: Diagnosis not present

## 2014-11-19 DIAGNOSIS — R531 Weakness: Secondary | ICD-10-CM | POA: Diagnosis not present

## 2014-11-20 DIAGNOSIS — N186 End stage renal disease: Secondary | ICD-10-CM | POA: Diagnosis not present

## 2014-11-20 DIAGNOSIS — D631 Anemia in chronic kidney disease: Secondary | ICD-10-CM | POA: Diagnosis not present

## 2014-11-21 DIAGNOSIS — D631 Anemia in chronic kidney disease: Secondary | ICD-10-CM | POA: Diagnosis not present

## 2014-11-21 DIAGNOSIS — N186 End stage renal disease: Secondary | ICD-10-CM | POA: Diagnosis not present

## 2014-11-22 DIAGNOSIS — N186 End stage renal disease: Secondary | ICD-10-CM | POA: Diagnosis not present

## 2014-11-22 DIAGNOSIS — D631 Anemia in chronic kidney disease: Secondary | ICD-10-CM | POA: Diagnosis not present

## 2014-11-23 DIAGNOSIS — D631 Anemia in chronic kidney disease: Secondary | ICD-10-CM | POA: Diagnosis not present

## 2014-11-23 DIAGNOSIS — N186 End stage renal disease: Secondary | ICD-10-CM | POA: Diagnosis not present

## 2014-11-23 DIAGNOSIS — R269 Unspecified abnormalities of gait and mobility: Secondary | ICD-10-CM | POA: Diagnosis not present

## 2014-11-23 DIAGNOSIS — R531 Weakness: Secondary | ICD-10-CM | POA: Diagnosis not present

## 2014-11-23 DIAGNOSIS — R293 Abnormal posture: Secondary | ICD-10-CM | POA: Diagnosis not present

## 2014-11-23 DIAGNOSIS — M2560 Stiffness of unspecified joint, not elsewhere classified: Secondary | ICD-10-CM | POA: Diagnosis not present

## 2014-11-24 DIAGNOSIS — D631 Anemia in chronic kidney disease: Secondary | ICD-10-CM | POA: Diagnosis not present

## 2014-11-24 DIAGNOSIS — N186 End stage renal disease: Secondary | ICD-10-CM | POA: Diagnosis not present

## 2014-11-25 DIAGNOSIS — R531 Weakness: Secondary | ICD-10-CM | POA: Diagnosis not present

## 2014-11-25 DIAGNOSIS — R269 Unspecified abnormalities of gait and mobility: Secondary | ICD-10-CM | POA: Diagnosis not present

## 2014-11-25 DIAGNOSIS — N186 End stage renal disease: Secondary | ICD-10-CM | POA: Diagnosis not present

## 2014-11-25 DIAGNOSIS — M2560 Stiffness of unspecified joint, not elsewhere classified: Secondary | ICD-10-CM | POA: Diagnosis not present

## 2014-11-25 DIAGNOSIS — R293 Abnormal posture: Secondary | ICD-10-CM | POA: Diagnosis not present

## 2014-11-25 DIAGNOSIS — D631 Anemia in chronic kidney disease: Secondary | ICD-10-CM | POA: Diagnosis not present

## 2014-11-26 DIAGNOSIS — N186 End stage renal disease: Secondary | ICD-10-CM | POA: Diagnosis not present

## 2014-11-26 DIAGNOSIS — D631 Anemia in chronic kidney disease: Secondary | ICD-10-CM | POA: Diagnosis not present

## 2014-11-27 DIAGNOSIS — D631 Anemia in chronic kidney disease: Secondary | ICD-10-CM | POA: Diagnosis not present

## 2014-11-27 DIAGNOSIS — N186 End stage renal disease: Secondary | ICD-10-CM | POA: Diagnosis not present

## 2014-11-28 DIAGNOSIS — N186 End stage renal disease: Secondary | ICD-10-CM | POA: Diagnosis not present

## 2014-11-28 DIAGNOSIS — D631 Anemia in chronic kidney disease: Secondary | ICD-10-CM | POA: Diagnosis not present

## 2014-11-29 DIAGNOSIS — N186 End stage renal disease: Secondary | ICD-10-CM | POA: Diagnosis not present

## 2014-11-29 DIAGNOSIS — D631 Anemia in chronic kidney disease: Secondary | ICD-10-CM | POA: Diagnosis not present

## 2014-11-30 DIAGNOSIS — N186 End stage renal disease: Secondary | ICD-10-CM | POA: Diagnosis not present

## 2014-11-30 DIAGNOSIS — D631 Anemia in chronic kidney disease: Secondary | ICD-10-CM | POA: Diagnosis not present

## 2014-12-01 DIAGNOSIS — D631 Anemia in chronic kidney disease: Secondary | ICD-10-CM | POA: Diagnosis not present

## 2014-12-01 DIAGNOSIS — R269 Unspecified abnormalities of gait and mobility: Secondary | ICD-10-CM | POA: Diagnosis not present

## 2014-12-01 DIAGNOSIS — M2560 Stiffness of unspecified joint, not elsewhere classified: Secondary | ICD-10-CM | POA: Diagnosis not present

## 2014-12-01 DIAGNOSIS — N186 End stage renal disease: Secondary | ICD-10-CM | POA: Diagnosis not present

## 2014-12-01 DIAGNOSIS — R293 Abnormal posture: Secondary | ICD-10-CM | POA: Diagnosis not present

## 2014-12-01 DIAGNOSIS — R531 Weakness: Secondary | ICD-10-CM | POA: Diagnosis not present

## 2014-12-02 DIAGNOSIS — N186 End stage renal disease: Secondary | ICD-10-CM | POA: Diagnosis not present

## 2014-12-02 DIAGNOSIS — D631 Anemia in chronic kidney disease: Secondary | ICD-10-CM | POA: Diagnosis not present

## 2014-12-03 DIAGNOSIS — N186 End stage renal disease: Secondary | ICD-10-CM | POA: Diagnosis not present

## 2014-12-03 DIAGNOSIS — D631 Anemia in chronic kidney disease: Secondary | ICD-10-CM | POA: Diagnosis not present

## 2014-12-03 DIAGNOSIS — M2560 Stiffness of unspecified joint, not elsewhere classified: Secondary | ICD-10-CM | POA: Diagnosis not present

## 2014-12-03 DIAGNOSIS — R269 Unspecified abnormalities of gait and mobility: Secondary | ICD-10-CM | POA: Diagnosis not present

## 2014-12-03 DIAGNOSIS — R531 Weakness: Secondary | ICD-10-CM | POA: Diagnosis not present

## 2014-12-03 DIAGNOSIS — R293 Abnormal posture: Secondary | ICD-10-CM | POA: Diagnosis not present

## 2014-12-04 DIAGNOSIS — D631 Anemia in chronic kidney disease: Secondary | ICD-10-CM | POA: Diagnosis not present

## 2014-12-04 DIAGNOSIS — N186 End stage renal disease: Secondary | ICD-10-CM | POA: Diagnosis not present

## 2014-12-05 DIAGNOSIS — D631 Anemia in chronic kidney disease: Secondary | ICD-10-CM | POA: Diagnosis not present

## 2014-12-05 DIAGNOSIS — N186 End stage renal disease: Secondary | ICD-10-CM | POA: Diagnosis not present

## 2014-12-06 DIAGNOSIS — D631 Anemia in chronic kidney disease: Secondary | ICD-10-CM | POA: Diagnosis not present

## 2014-12-06 DIAGNOSIS — N186 End stage renal disease: Secondary | ICD-10-CM | POA: Diagnosis not present

## 2014-12-07 DIAGNOSIS — D631 Anemia in chronic kidney disease: Secondary | ICD-10-CM | POA: Diagnosis not present

## 2014-12-07 DIAGNOSIS — N186 End stage renal disease: Secondary | ICD-10-CM | POA: Diagnosis not present

## 2014-12-08 DIAGNOSIS — D631 Anemia in chronic kidney disease: Secondary | ICD-10-CM | POA: Diagnosis not present

## 2014-12-08 DIAGNOSIS — N186 End stage renal disease: Secondary | ICD-10-CM | POA: Diagnosis not present

## 2014-12-09 DIAGNOSIS — D631 Anemia in chronic kidney disease: Secondary | ICD-10-CM | POA: Diagnosis not present

## 2014-12-09 DIAGNOSIS — R269 Unspecified abnormalities of gait and mobility: Secondary | ICD-10-CM | POA: Diagnosis not present

## 2014-12-09 DIAGNOSIS — M2560 Stiffness of unspecified joint, not elsewhere classified: Secondary | ICD-10-CM | POA: Diagnosis not present

## 2014-12-09 DIAGNOSIS — R293 Abnormal posture: Secondary | ICD-10-CM | POA: Diagnosis not present

## 2014-12-09 DIAGNOSIS — R531 Weakness: Secondary | ICD-10-CM | POA: Diagnosis not present

## 2014-12-09 DIAGNOSIS — N186 End stage renal disease: Secondary | ICD-10-CM | POA: Diagnosis not present

## 2014-12-10 DIAGNOSIS — D631 Anemia in chronic kidney disease: Secondary | ICD-10-CM | POA: Diagnosis not present

## 2014-12-10 DIAGNOSIS — N186 End stage renal disease: Secondary | ICD-10-CM | POA: Diagnosis not present

## 2014-12-11 DIAGNOSIS — N186 End stage renal disease: Secondary | ICD-10-CM | POA: Diagnosis not present

## 2014-12-11 DIAGNOSIS — D631 Anemia in chronic kidney disease: Secondary | ICD-10-CM | POA: Diagnosis not present

## 2014-12-12 DIAGNOSIS — D631 Anemia in chronic kidney disease: Secondary | ICD-10-CM | POA: Diagnosis not present

## 2014-12-12 DIAGNOSIS — N186 End stage renal disease: Secondary | ICD-10-CM | POA: Diagnosis not present

## 2014-12-13 DIAGNOSIS — D631 Anemia in chronic kidney disease: Secondary | ICD-10-CM | POA: Diagnosis not present

## 2014-12-13 DIAGNOSIS — N186 End stage renal disease: Secondary | ICD-10-CM | POA: Diagnosis not present

## 2014-12-14 DIAGNOSIS — N186 End stage renal disease: Secondary | ICD-10-CM | POA: Diagnosis not present

## 2014-12-14 DIAGNOSIS — D631 Anemia in chronic kidney disease: Secondary | ICD-10-CM | POA: Diagnosis not present

## 2014-12-15 DIAGNOSIS — M2560 Stiffness of unspecified joint, not elsewhere classified: Secondary | ICD-10-CM | POA: Diagnosis not present

## 2014-12-15 DIAGNOSIS — R293 Abnormal posture: Secondary | ICD-10-CM | POA: Diagnosis not present

## 2014-12-15 DIAGNOSIS — R531 Weakness: Secondary | ICD-10-CM | POA: Diagnosis not present

## 2014-12-15 DIAGNOSIS — D631 Anemia in chronic kidney disease: Secondary | ICD-10-CM | POA: Diagnosis not present

## 2014-12-15 DIAGNOSIS — N186 End stage renal disease: Secondary | ICD-10-CM | POA: Diagnosis not present

## 2014-12-15 DIAGNOSIS — R269 Unspecified abnormalities of gait and mobility: Secondary | ICD-10-CM | POA: Diagnosis not present

## 2014-12-16 DIAGNOSIS — D631 Anemia in chronic kidney disease: Secondary | ICD-10-CM | POA: Diagnosis not present

## 2014-12-16 DIAGNOSIS — N186 End stage renal disease: Secondary | ICD-10-CM | POA: Diagnosis not present

## 2014-12-17 DIAGNOSIS — M2560 Stiffness of unspecified joint, not elsewhere classified: Secondary | ICD-10-CM | POA: Diagnosis not present

## 2014-12-17 DIAGNOSIS — N186 End stage renal disease: Secondary | ICD-10-CM | POA: Diagnosis not present

## 2014-12-17 DIAGNOSIS — D631 Anemia in chronic kidney disease: Secondary | ICD-10-CM | POA: Diagnosis not present

## 2014-12-17 DIAGNOSIS — R269 Unspecified abnormalities of gait and mobility: Secondary | ICD-10-CM | POA: Diagnosis not present

## 2014-12-17 DIAGNOSIS — R293 Abnormal posture: Secondary | ICD-10-CM | POA: Diagnosis not present

## 2014-12-17 DIAGNOSIS — R531 Weakness: Secondary | ICD-10-CM | POA: Diagnosis not present

## 2014-12-18 DIAGNOSIS — N186 End stage renal disease: Secondary | ICD-10-CM | POA: Diagnosis not present

## 2014-12-18 DIAGNOSIS — D631 Anemia in chronic kidney disease: Secondary | ICD-10-CM | POA: Diagnosis not present

## 2014-12-19 DIAGNOSIS — D631 Anemia in chronic kidney disease: Secondary | ICD-10-CM | POA: Diagnosis not present

## 2014-12-19 DIAGNOSIS — N186 End stage renal disease: Secondary | ICD-10-CM | POA: Diagnosis not present

## 2014-12-20 DIAGNOSIS — N186 End stage renal disease: Secondary | ICD-10-CM | POA: Diagnosis not present

## 2014-12-20 DIAGNOSIS — D631 Anemia in chronic kidney disease: Secondary | ICD-10-CM | POA: Diagnosis not present

## 2014-12-21 DIAGNOSIS — N186 End stage renal disease: Secondary | ICD-10-CM | POA: Diagnosis not present

## 2014-12-21 DIAGNOSIS — D631 Anemia in chronic kidney disease: Secondary | ICD-10-CM | POA: Diagnosis not present

## 2014-12-22 DIAGNOSIS — N186 End stage renal disease: Secondary | ICD-10-CM | POA: Diagnosis not present

## 2014-12-22 DIAGNOSIS — R293 Abnormal posture: Secondary | ICD-10-CM | POA: Diagnosis not present

## 2014-12-22 DIAGNOSIS — M2560 Stiffness of unspecified joint, not elsewhere classified: Secondary | ICD-10-CM | POA: Diagnosis not present

## 2014-12-22 DIAGNOSIS — D631 Anemia in chronic kidney disease: Secondary | ICD-10-CM | POA: Diagnosis not present

## 2014-12-22 DIAGNOSIS — R269 Unspecified abnormalities of gait and mobility: Secondary | ICD-10-CM | POA: Diagnosis not present

## 2014-12-22 DIAGNOSIS — R531 Weakness: Secondary | ICD-10-CM | POA: Diagnosis not present

## 2014-12-23 DIAGNOSIS — D631 Anemia in chronic kidney disease: Secondary | ICD-10-CM | POA: Diagnosis not present

## 2014-12-23 DIAGNOSIS — N186 End stage renal disease: Secondary | ICD-10-CM | POA: Diagnosis not present

## 2014-12-24 DIAGNOSIS — D631 Anemia in chronic kidney disease: Secondary | ICD-10-CM | POA: Diagnosis not present

## 2014-12-24 DIAGNOSIS — H60501 Unspecified acute noninfective otitis externa, right ear: Secondary | ICD-10-CM | POA: Diagnosis not present

## 2014-12-24 DIAGNOSIS — N186 End stage renal disease: Secondary | ICD-10-CM | POA: Diagnosis not present

## 2014-12-24 DIAGNOSIS — H6691 Otitis media, unspecified, right ear: Secondary | ICD-10-CM | POA: Diagnosis not present

## 2014-12-25 DIAGNOSIS — D631 Anemia in chronic kidney disease: Secondary | ICD-10-CM | POA: Diagnosis not present

## 2014-12-25 DIAGNOSIS — N186 End stage renal disease: Secondary | ICD-10-CM | POA: Diagnosis not present

## 2014-12-26 DIAGNOSIS — N186 End stage renal disease: Secondary | ICD-10-CM | POA: Diagnosis not present

## 2014-12-26 DIAGNOSIS — D631 Anemia in chronic kidney disease: Secondary | ICD-10-CM | POA: Diagnosis not present

## 2014-12-27 DIAGNOSIS — N186 End stage renal disease: Secondary | ICD-10-CM | POA: Diagnosis not present

## 2014-12-27 DIAGNOSIS — D631 Anemia in chronic kidney disease: Secondary | ICD-10-CM | POA: Diagnosis not present

## 2014-12-28 DIAGNOSIS — N186 End stage renal disease: Secondary | ICD-10-CM | POA: Diagnosis not present

## 2014-12-28 DIAGNOSIS — D631 Anemia in chronic kidney disease: Secondary | ICD-10-CM | POA: Diagnosis not present

## 2014-12-29 DIAGNOSIS — D631 Anemia in chronic kidney disease: Secondary | ICD-10-CM | POA: Diagnosis not present

## 2014-12-29 DIAGNOSIS — N186 End stage renal disease: Secondary | ICD-10-CM | POA: Diagnosis not present

## 2014-12-29 DIAGNOSIS — M2560 Stiffness of unspecified joint, not elsewhere classified: Secondary | ICD-10-CM | POA: Diagnosis not present

## 2014-12-29 DIAGNOSIS — R269 Unspecified abnormalities of gait and mobility: Secondary | ICD-10-CM | POA: Diagnosis not present

## 2014-12-29 DIAGNOSIS — R293 Abnormal posture: Secondary | ICD-10-CM | POA: Diagnosis not present

## 2014-12-29 DIAGNOSIS — R531 Weakness: Secondary | ICD-10-CM | POA: Diagnosis not present

## 2014-12-30 DIAGNOSIS — N186 End stage renal disease: Secondary | ICD-10-CM | POA: Diagnosis not present

## 2014-12-30 DIAGNOSIS — D631 Anemia in chronic kidney disease: Secondary | ICD-10-CM | POA: Diagnosis not present

## 2014-12-31 DIAGNOSIS — N186 End stage renal disease: Secondary | ICD-10-CM | POA: Diagnosis not present

## 2014-12-31 DIAGNOSIS — D631 Anemia in chronic kidney disease: Secondary | ICD-10-CM | POA: Diagnosis not present

## 2014-12-31 DIAGNOSIS — M2560 Stiffness of unspecified joint, not elsewhere classified: Secondary | ICD-10-CM | POA: Diagnosis not present

## 2014-12-31 DIAGNOSIS — R269 Unspecified abnormalities of gait and mobility: Secondary | ICD-10-CM | POA: Diagnosis not present

## 2014-12-31 DIAGNOSIS — R293 Abnormal posture: Secondary | ICD-10-CM | POA: Diagnosis not present

## 2014-12-31 DIAGNOSIS — R531 Weakness: Secondary | ICD-10-CM | POA: Diagnosis not present

## 2015-01-01 DIAGNOSIS — D631 Anemia in chronic kidney disease: Secondary | ICD-10-CM | POA: Diagnosis not present

## 2015-01-01 DIAGNOSIS — N186 End stage renal disease: Secondary | ICD-10-CM | POA: Diagnosis not present

## 2015-01-02 DIAGNOSIS — D631 Anemia in chronic kidney disease: Secondary | ICD-10-CM | POA: Diagnosis not present

## 2015-01-02 DIAGNOSIS — K1370 Unspecified lesions of oral mucosa: Secondary | ICD-10-CM | POA: Diagnosis not present

## 2015-01-02 DIAGNOSIS — N186 End stage renal disease: Secondary | ICD-10-CM | POA: Diagnosis not present

## 2015-01-03 DIAGNOSIS — D631 Anemia in chronic kidney disease: Secondary | ICD-10-CM | POA: Diagnosis not present

## 2015-01-03 DIAGNOSIS — N186 End stage renal disease: Secondary | ICD-10-CM | POA: Diagnosis not present

## 2015-01-04 DIAGNOSIS — N186 End stage renal disease: Secondary | ICD-10-CM | POA: Diagnosis not present

## 2015-01-04 DIAGNOSIS — D631 Anemia in chronic kidney disease: Secondary | ICD-10-CM | POA: Diagnosis not present

## 2015-01-05 DIAGNOSIS — D631 Anemia in chronic kidney disease: Secondary | ICD-10-CM | POA: Diagnosis not present

## 2015-01-05 DIAGNOSIS — R293 Abnormal posture: Secondary | ICD-10-CM | POA: Diagnosis not present

## 2015-01-05 DIAGNOSIS — N186 End stage renal disease: Secondary | ICD-10-CM | POA: Diagnosis not present

## 2015-01-05 DIAGNOSIS — R531 Weakness: Secondary | ICD-10-CM | POA: Diagnosis not present

## 2015-01-05 DIAGNOSIS — M2560 Stiffness of unspecified joint, not elsewhere classified: Secondary | ICD-10-CM | POA: Diagnosis not present

## 2015-01-05 DIAGNOSIS — R269 Unspecified abnormalities of gait and mobility: Secondary | ICD-10-CM | POA: Diagnosis not present

## 2015-01-06 DIAGNOSIS — D631 Anemia in chronic kidney disease: Secondary | ICD-10-CM | POA: Diagnosis not present

## 2015-01-06 DIAGNOSIS — N186 End stage renal disease: Secondary | ICD-10-CM | POA: Diagnosis not present

## 2015-01-07 DIAGNOSIS — D631 Anemia in chronic kidney disease: Secondary | ICD-10-CM | POA: Diagnosis not present

## 2015-01-07 DIAGNOSIS — N186 End stage renal disease: Secondary | ICD-10-CM | POA: Diagnosis not present

## 2015-01-08 DIAGNOSIS — N186 End stage renal disease: Secondary | ICD-10-CM | POA: Diagnosis not present

## 2015-01-08 DIAGNOSIS — D631 Anemia in chronic kidney disease: Secondary | ICD-10-CM | POA: Diagnosis not present

## 2015-01-09 DIAGNOSIS — N186 End stage renal disease: Secondary | ICD-10-CM | POA: Diagnosis not present

## 2015-01-09 DIAGNOSIS — D631 Anemia in chronic kidney disease: Secondary | ICD-10-CM | POA: Diagnosis not present

## 2015-01-10 DIAGNOSIS — D631 Anemia in chronic kidney disease: Secondary | ICD-10-CM | POA: Diagnosis not present

## 2015-01-10 DIAGNOSIS — N186 End stage renal disease: Secondary | ICD-10-CM | POA: Diagnosis not present

## 2015-01-11 DIAGNOSIS — R531 Weakness: Secondary | ICD-10-CM | POA: Diagnosis not present

## 2015-01-11 DIAGNOSIS — D631 Anemia in chronic kidney disease: Secondary | ICD-10-CM | POA: Diagnosis not present

## 2015-01-11 DIAGNOSIS — M2560 Stiffness of unspecified joint, not elsewhere classified: Secondary | ICD-10-CM | POA: Diagnosis not present

## 2015-01-11 DIAGNOSIS — R293 Abnormal posture: Secondary | ICD-10-CM | POA: Diagnosis not present

## 2015-01-11 DIAGNOSIS — R269 Unspecified abnormalities of gait and mobility: Secondary | ICD-10-CM | POA: Diagnosis not present

## 2015-01-11 DIAGNOSIS — N186 End stage renal disease: Secondary | ICD-10-CM | POA: Diagnosis not present

## 2015-01-12 DIAGNOSIS — D631 Anemia in chronic kidney disease: Secondary | ICD-10-CM | POA: Diagnosis not present

## 2015-01-12 DIAGNOSIS — N186 End stage renal disease: Secondary | ICD-10-CM | POA: Diagnosis not present

## 2015-01-13 DIAGNOSIS — N186 End stage renal disease: Secondary | ICD-10-CM | POA: Diagnosis not present

## 2015-01-13 DIAGNOSIS — R293 Abnormal posture: Secondary | ICD-10-CM | POA: Diagnosis not present

## 2015-01-13 DIAGNOSIS — R269 Unspecified abnormalities of gait and mobility: Secondary | ICD-10-CM | POA: Diagnosis not present

## 2015-01-13 DIAGNOSIS — D631 Anemia in chronic kidney disease: Secondary | ICD-10-CM | POA: Diagnosis not present

## 2015-01-13 DIAGNOSIS — R531 Weakness: Secondary | ICD-10-CM | POA: Diagnosis not present

## 2015-01-13 DIAGNOSIS — M2560 Stiffness of unspecified joint, not elsewhere classified: Secondary | ICD-10-CM | POA: Diagnosis not present

## 2015-01-14 DIAGNOSIS — D631 Anemia in chronic kidney disease: Secondary | ICD-10-CM | POA: Diagnosis not present

## 2015-01-14 DIAGNOSIS — N186 End stage renal disease: Secondary | ICD-10-CM | POA: Diagnosis not present

## 2015-01-15 DIAGNOSIS — D631 Anemia in chronic kidney disease: Secondary | ICD-10-CM | POA: Diagnosis not present

## 2015-01-15 DIAGNOSIS — N186 End stage renal disease: Secondary | ICD-10-CM | POA: Diagnosis not present

## 2015-01-16 DIAGNOSIS — N186 End stage renal disease: Secondary | ICD-10-CM | POA: Diagnosis not present

## 2015-01-16 DIAGNOSIS — D631 Anemia in chronic kidney disease: Secondary | ICD-10-CM | POA: Diagnosis not present

## 2015-01-17 DIAGNOSIS — D631 Anemia in chronic kidney disease: Secondary | ICD-10-CM | POA: Diagnosis not present

## 2015-01-17 DIAGNOSIS — N186 End stage renal disease: Secondary | ICD-10-CM | POA: Diagnosis not present

## 2015-01-18 DIAGNOSIS — R531 Weakness: Secondary | ICD-10-CM | POA: Diagnosis not present

## 2015-01-18 DIAGNOSIS — N186 End stage renal disease: Secondary | ICD-10-CM | POA: Diagnosis not present

## 2015-01-18 DIAGNOSIS — M2560 Stiffness of unspecified joint, not elsewhere classified: Secondary | ICD-10-CM | POA: Diagnosis not present

## 2015-01-18 DIAGNOSIS — R269 Unspecified abnormalities of gait and mobility: Secondary | ICD-10-CM | POA: Diagnosis not present

## 2015-01-18 DIAGNOSIS — D631 Anemia in chronic kidney disease: Secondary | ICD-10-CM | POA: Diagnosis not present

## 2015-01-18 DIAGNOSIS — R293 Abnormal posture: Secondary | ICD-10-CM | POA: Diagnosis not present

## 2015-01-19 DIAGNOSIS — N186 End stage renal disease: Secondary | ICD-10-CM | POA: Diagnosis not present

## 2015-01-19 DIAGNOSIS — D631 Anemia in chronic kidney disease: Secondary | ICD-10-CM | POA: Diagnosis not present

## 2015-01-20 DIAGNOSIS — M79672 Pain in left foot: Secondary | ICD-10-CM | POA: Diagnosis not present

## 2015-01-20 DIAGNOSIS — M2560 Stiffness of unspecified joint, not elsewhere classified: Secondary | ICD-10-CM | POA: Diagnosis not present

## 2015-01-20 DIAGNOSIS — R269 Unspecified abnormalities of gait and mobility: Secondary | ICD-10-CM | POA: Diagnosis not present

## 2015-01-20 DIAGNOSIS — R293 Abnormal posture: Secondary | ICD-10-CM | POA: Diagnosis not present

## 2015-01-20 DIAGNOSIS — N186 End stage renal disease: Secondary | ICD-10-CM | POA: Diagnosis not present

## 2015-01-20 DIAGNOSIS — D631 Anemia in chronic kidney disease: Secondary | ICD-10-CM | POA: Diagnosis not present

## 2015-01-20 DIAGNOSIS — R531 Weakness: Secondary | ICD-10-CM | POA: Diagnosis not present

## 2015-01-21 DIAGNOSIS — D631 Anemia in chronic kidney disease: Secondary | ICD-10-CM | POA: Diagnosis not present

## 2015-01-21 DIAGNOSIS — N186 End stage renal disease: Secondary | ICD-10-CM | POA: Diagnosis not present

## 2015-01-22 DIAGNOSIS — N186 End stage renal disease: Secondary | ICD-10-CM | POA: Diagnosis not present

## 2015-01-22 DIAGNOSIS — D631 Anemia in chronic kidney disease: Secondary | ICD-10-CM | POA: Diagnosis not present

## 2015-01-23 DIAGNOSIS — N186 End stage renal disease: Secondary | ICD-10-CM | POA: Diagnosis not present

## 2015-01-23 DIAGNOSIS — D631 Anemia in chronic kidney disease: Secondary | ICD-10-CM | POA: Diagnosis not present

## 2015-01-24 DIAGNOSIS — D631 Anemia in chronic kidney disease: Secondary | ICD-10-CM | POA: Diagnosis not present

## 2015-01-24 DIAGNOSIS — N186 End stage renal disease: Secondary | ICD-10-CM | POA: Diagnosis not present

## 2015-01-25 DIAGNOSIS — R531 Weakness: Secondary | ICD-10-CM | POA: Diagnosis not present

## 2015-01-25 DIAGNOSIS — M2560 Stiffness of unspecified joint, not elsewhere classified: Secondary | ICD-10-CM | POA: Diagnosis not present

## 2015-01-25 DIAGNOSIS — N186 End stage renal disease: Secondary | ICD-10-CM | POA: Diagnosis not present

## 2015-01-25 DIAGNOSIS — R293 Abnormal posture: Secondary | ICD-10-CM | POA: Diagnosis not present

## 2015-01-25 DIAGNOSIS — R269 Unspecified abnormalities of gait and mobility: Secondary | ICD-10-CM | POA: Diagnosis not present

## 2015-01-25 DIAGNOSIS — D631 Anemia in chronic kidney disease: Secondary | ICD-10-CM | POA: Diagnosis not present

## 2015-01-26 DIAGNOSIS — D631 Anemia in chronic kidney disease: Secondary | ICD-10-CM | POA: Diagnosis not present

## 2015-01-26 DIAGNOSIS — N186 End stage renal disease: Secondary | ICD-10-CM | POA: Diagnosis not present

## 2015-01-27 DIAGNOSIS — N186 End stage renal disease: Secondary | ICD-10-CM | POA: Diagnosis not present

## 2015-01-27 DIAGNOSIS — D631 Anemia in chronic kidney disease: Secondary | ICD-10-CM | POA: Diagnosis not present

## 2015-01-28 DIAGNOSIS — D631 Anemia in chronic kidney disease: Secondary | ICD-10-CM | POA: Diagnosis not present

## 2015-01-28 DIAGNOSIS — N186 End stage renal disease: Secondary | ICD-10-CM | POA: Diagnosis not present

## 2015-01-29 DIAGNOSIS — D631 Anemia in chronic kidney disease: Secondary | ICD-10-CM | POA: Diagnosis not present

## 2015-01-29 DIAGNOSIS — N186 End stage renal disease: Secondary | ICD-10-CM | POA: Diagnosis not present

## 2015-01-30 DIAGNOSIS — D631 Anemia in chronic kidney disease: Secondary | ICD-10-CM | POA: Diagnosis not present

## 2015-01-30 DIAGNOSIS — N186 End stage renal disease: Secondary | ICD-10-CM | POA: Diagnosis not present

## 2015-01-31 DIAGNOSIS — D631 Anemia in chronic kidney disease: Secondary | ICD-10-CM | POA: Diagnosis not present

## 2015-01-31 DIAGNOSIS — N186 End stage renal disease: Secondary | ICD-10-CM | POA: Diagnosis not present

## 2015-02-01 DIAGNOSIS — N186 End stage renal disease: Secondary | ICD-10-CM | POA: Diagnosis not present

## 2015-02-01 DIAGNOSIS — D631 Anemia in chronic kidney disease: Secondary | ICD-10-CM | POA: Diagnosis not present

## 2015-02-02 DIAGNOSIS — D631 Anemia in chronic kidney disease: Secondary | ICD-10-CM | POA: Diagnosis not present

## 2015-02-02 DIAGNOSIS — N186 End stage renal disease: Secondary | ICD-10-CM | POA: Diagnosis not present

## 2015-02-03 DIAGNOSIS — N186 End stage renal disease: Secondary | ICD-10-CM | POA: Diagnosis not present

## 2015-02-03 DIAGNOSIS — D631 Anemia in chronic kidney disease: Secondary | ICD-10-CM | POA: Diagnosis not present

## 2015-02-04 DIAGNOSIS — D631 Anemia in chronic kidney disease: Secondary | ICD-10-CM | POA: Diagnosis not present

## 2015-02-04 DIAGNOSIS — N186 End stage renal disease: Secondary | ICD-10-CM | POA: Diagnosis not present

## 2015-02-05 DIAGNOSIS — N186 End stage renal disease: Secondary | ICD-10-CM | POA: Diagnosis not present

## 2015-02-05 DIAGNOSIS — D631 Anemia in chronic kidney disease: Secondary | ICD-10-CM | POA: Diagnosis not present

## 2015-02-06 DIAGNOSIS — D631 Anemia in chronic kidney disease: Secondary | ICD-10-CM | POA: Diagnosis not present

## 2015-02-06 DIAGNOSIS — N186 End stage renal disease: Secondary | ICD-10-CM | POA: Diagnosis not present

## 2015-02-07 DIAGNOSIS — N186 End stage renal disease: Secondary | ICD-10-CM | POA: Diagnosis not present

## 2015-02-07 DIAGNOSIS — D631 Anemia in chronic kidney disease: Secondary | ICD-10-CM | POA: Diagnosis not present

## 2015-02-08 DIAGNOSIS — D631 Anemia in chronic kidney disease: Secondary | ICD-10-CM | POA: Diagnosis not present

## 2015-02-08 DIAGNOSIS — N186 End stage renal disease: Secondary | ICD-10-CM | POA: Diagnosis not present

## 2015-02-09 DIAGNOSIS — D631 Anemia in chronic kidney disease: Secondary | ICD-10-CM | POA: Diagnosis not present

## 2015-02-09 DIAGNOSIS — N186 End stage renal disease: Secondary | ICD-10-CM | POA: Diagnosis not present

## 2015-02-10 DIAGNOSIS — N186 End stage renal disease: Secondary | ICD-10-CM | POA: Diagnosis not present

## 2015-02-10 DIAGNOSIS — D631 Anemia in chronic kidney disease: Secondary | ICD-10-CM | POA: Diagnosis not present

## 2015-02-11 DIAGNOSIS — D631 Anemia in chronic kidney disease: Secondary | ICD-10-CM | POA: Diagnosis not present

## 2015-02-11 DIAGNOSIS — N186 End stage renal disease: Secondary | ICD-10-CM | POA: Diagnosis not present

## 2015-02-12 DIAGNOSIS — N186 End stage renal disease: Secondary | ICD-10-CM | POA: Diagnosis not present

## 2015-02-12 DIAGNOSIS — D631 Anemia in chronic kidney disease: Secondary | ICD-10-CM | POA: Diagnosis not present

## 2015-02-13 DIAGNOSIS — N186 End stage renal disease: Secondary | ICD-10-CM | POA: Diagnosis not present

## 2015-02-13 DIAGNOSIS — D631 Anemia in chronic kidney disease: Secondary | ICD-10-CM | POA: Diagnosis not present

## 2015-02-14 DIAGNOSIS — N186 End stage renal disease: Secondary | ICD-10-CM | POA: Diagnosis not present

## 2015-02-14 DIAGNOSIS — D631 Anemia in chronic kidney disease: Secondary | ICD-10-CM | POA: Diagnosis not present

## 2015-02-14 DIAGNOSIS — E785 Hyperlipidemia, unspecified: Secondary | ICD-10-CM | POA: Diagnosis not present

## 2015-02-15 DIAGNOSIS — N186 End stage renal disease: Secondary | ICD-10-CM | POA: Diagnosis not present

## 2015-02-15 DIAGNOSIS — D631 Anemia in chronic kidney disease: Secondary | ICD-10-CM | POA: Diagnosis not present

## 2015-02-16 DIAGNOSIS — D631 Anemia in chronic kidney disease: Secondary | ICD-10-CM | POA: Diagnosis not present

## 2015-02-16 DIAGNOSIS — N186 End stage renal disease: Secondary | ICD-10-CM | POA: Diagnosis not present

## 2015-02-17 DIAGNOSIS — D631 Anemia in chronic kidney disease: Secondary | ICD-10-CM | POA: Diagnosis not present

## 2015-02-17 DIAGNOSIS — N186 End stage renal disease: Secondary | ICD-10-CM | POA: Diagnosis not present

## 2015-02-18 DIAGNOSIS — D631 Anemia in chronic kidney disease: Secondary | ICD-10-CM | POA: Diagnosis not present

## 2015-02-18 DIAGNOSIS — N186 End stage renal disease: Secondary | ICD-10-CM | POA: Diagnosis not present

## 2015-02-19 DIAGNOSIS — N186 End stage renal disease: Secondary | ICD-10-CM | POA: Diagnosis not present

## 2015-02-19 DIAGNOSIS — D631 Anemia in chronic kidney disease: Secondary | ICD-10-CM | POA: Diagnosis not present

## 2015-02-20 DIAGNOSIS — N186 End stage renal disease: Secondary | ICD-10-CM | POA: Diagnosis not present

## 2015-02-20 DIAGNOSIS — D631 Anemia in chronic kidney disease: Secondary | ICD-10-CM | POA: Diagnosis not present

## 2015-02-21 DIAGNOSIS — D631 Anemia in chronic kidney disease: Secondary | ICD-10-CM | POA: Diagnosis not present

## 2015-02-21 DIAGNOSIS — N186 End stage renal disease: Secondary | ICD-10-CM | POA: Diagnosis not present

## 2015-02-22 DIAGNOSIS — D631 Anemia in chronic kidney disease: Secondary | ICD-10-CM | POA: Diagnosis not present

## 2015-02-22 DIAGNOSIS — N186 End stage renal disease: Secondary | ICD-10-CM | POA: Diagnosis not present

## 2015-02-23 DIAGNOSIS — D631 Anemia in chronic kidney disease: Secondary | ICD-10-CM | POA: Diagnosis not present

## 2015-02-23 DIAGNOSIS — N186 End stage renal disease: Secondary | ICD-10-CM | POA: Diagnosis not present

## 2015-02-24 DIAGNOSIS — N186 End stage renal disease: Secondary | ICD-10-CM | POA: Diagnosis not present

## 2015-02-24 DIAGNOSIS — D631 Anemia in chronic kidney disease: Secondary | ICD-10-CM | POA: Diagnosis not present

## 2015-02-25 DIAGNOSIS — N186 End stage renal disease: Secondary | ICD-10-CM | POA: Diagnosis not present

## 2015-02-25 DIAGNOSIS — D631 Anemia in chronic kidney disease: Secondary | ICD-10-CM | POA: Diagnosis not present

## 2015-02-26 DIAGNOSIS — D631 Anemia in chronic kidney disease: Secondary | ICD-10-CM | POA: Diagnosis not present

## 2015-02-26 DIAGNOSIS — N186 End stage renal disease: Secondary | ICD-10-CM | POA: Diagnosis not present

## 2015-02-27 DIAGNOSIS — N186 End stage renal disease: Secondary | ICD-10-CM | POA: Diagnosis not present

## 2015-02-27 DIAGNOSIS — D631 Anemia in chronic kidney disease: Secondary | ICD-10-CM | POA: Diagnosis not present

## 2015-02-28 DIAGNOSIS — N186 End stage renal disease: Secondary | ICD-10-CM | POA: Diagnosis not present

## 2015-02-28 DIAGNOSIS — D631 Anemia in chronic kidney disease: Secondary | ICD-10-CM | POA: Diagnosis not present

## 2015-03-01 DIAGNOSIS — D631 Anemia in chronic kidney disease: Secondary | ICD-10-CM | POA: Diagnosis not present

## 2015-03-01 DIAGNOSIS — N186 End stage renal disease: Secondary | ICD-10-CM | POA: Diagnosis not present

## 2015-03-02 DIAGNOSIS — N186 End stage renal disease: Secondary | ICD-10-CM | POA: Diagnosis not present

## 2015-03-03 DIAGNOSIS — N186 End stage renal disease: Secondary | ICD-10-CM | POA: Diagnosis not present

## 2015-03-04 DIAGNOSIS — N186 End stage renal disease: Secondary | ICD-10-CM | POA: Diagnosis not present

## 2015-03-05 DIAGNOSIS — N186 End stage renal disease: Secondary | ICD-10-CM | POA: Diagnosis not present

## 2015-03-06 DIAGNOSIS — N186 End stage renal disease: Secondary | ICD-10-CM | POA: Diagnosis not present

## 2015-03-07 DIAGNOSIS — N186 End stage renal disease: Secondary | ICD-10-CM | POA: Diagnosis not present

## 2015-03-08 DIAGNOSIS — N186 End stage renal disease: Secondary | ICD-10-CM | POA: Diagnosis not present

## 2015-03-09 DIAGNOSIS — N186 End stage renal disease: Secondary | ICD-10-CM | POA: Diagnosis not present

## 2015-03-10 DIAGNOSIS — N186 End stage renal disease: Secondary | ICD-10-CM | POA: Diagnosis not present

## 2015-03-11 DIAGNOSIS — N186 End stage renal disease: Secondary | ICD-10-CM | POA: Diagnosis not present

## 2015-03-12 DIAGNOSIS — N186 End stage renal disease: Secondary | ICD-10-CM | POA: Diagnosis not present

## 2015-03-13 DIAGNOSIS — N186 End stage renal disease: Secondary | ICD-10-CM | POA: Diagnosis not present

## 2015-03-14 DIAGNOSIS — N186 End stage renal disease: Secondary | ICD-10-CM | POA: Diagnosis not present

## 2015-03-15 DIAGNOSIS — N186 End stage renal disease: Secondary | ICD-10-CM | POA: Diagnosis not present

## 2015-03-16 DIAGNOSIS — N186 End stage renal disease: Secondary | ICD-10-CM | POA: Diagnosis not present

## 2015-03-17 DIAGNOSIS — N186 End stage renal disease: Secondary | ICD-10-CM | POA: Diagnosis not present

## 2015-03-18 DIAGNOSIS — N186 End stage renal disease: Secondary | ICD-10-CM | POA: Diagnosis not present

## 2015-03-19 DIAGNOSIS — N186 End stage renal disease: Secondary | ICD-10-CM | POA: Diagnosis not present

## 2015-03-20 DIAGNOSIS — N186 End stage renal disease: Secondary | ICD-10-CM | POA: Diagnosis not present

## 2015-03-21 DIAGNOSIS — N186 End stage renal disease: Secondary | ICD-10-CM | POA: Diagnosis not present

## 2015-03-22 DIAGNOSIS — N186 End stage renal disease: Secondary | ICD-10-CM | POA: Diagnosis not present

## 2015-03-23 DIAGNOSIS — N186 End stage renal disease: Secondary | ICD-10-CM | POA: Diagnosis not present

## 2015-03-24 DIAGNOSIS — N186 End stage renal disease: Secondary | ICD-10-CM | POA: Diagnosis not present

## 2015-03-25 DIAGNOSIS — N186 End stage renal disease: Secondary | ICD-10-CM | POA: Diagnosis not present

## 2015-03-26 DIAGNOSIS — N186 End stage renal disease: Secondary | ICD-10-CM | POA: Diagnosis not present

## 2015-03-27 DIAGNOSIS — N186 End stage renal disease: Secondary | ICD-10-CM | POA: Diagnosis not present

## 2015-03-28 DIAGNOSIS — N186 End stage renal disease: Secondary | ICD-10-CM | POA: Diagnosis not present

## 2015-03-29 DIAGNOSIS — N186 End stage renal disease: Secondary | ICD-10-CM | POA: Diagnosis not present

## 2015-03-30 DIAGNOSIS — D631 Anemia in chronic kidney disease: Secondary | ICD-10-CM | POA: Diagnosis not present

## 2015-03-30 DIAGNOSIS — N186 End stage renal disease: Secondary | ICD-10-CM | POA: Diagnosis not present

## 2015-03-31 DIAGNOSIS — N186 End stage renal disease: Secondary | ICD-10-CM | POA: Diagnosis not present

## 2015-03-31 DIAGNOSIS — D631 Anemia in chronic kidney disease: Secondary | ICD-10-CM | POA: Diagnosis not present

## 2015-04-01 DIAGNOSIS — N186 End stage renal disease: Secondary | ICD-10-CM | POA: Diagnosis not present

## 2015-04-01 DIAGNOSIS — D631 Anemia in chronic kidney disease: Secondary | ICD-10-CM | POA: Diagnosis not present

## 2015-04-02 DIAGNOSIS — N186 End stage renal disease: Secondary | ICD-10-CM | POA: Diagnosis not present

## 2015-04-02 DIAGNOSIS — D631 Anemia in chronic kidney disease: Secondary | ICD-10-CM | POA: Diagnosis not present

## 2015-04-03 DIAGNOSIS — D631 Anemia in chronic kidney disease: Secondary | ICD-10-CM | POA: Diagnosis not present

## 2015-04-03 DIAGNOSIS — N186 End stage renal disease: Secondary | ICD-10-CM | POA: Diagnosis not present

## 2015-04-04 DIAGNOSIS — N186 End stage renal disease: Secondary | ICD-10-CM | POA: Diagnosis not present

## 2015-04-04 DIAGNOSIS — D631 Anemia in chronic kidney disease: Secondary | ICD-10-CM | POA: Diagnosis not present

## 2015-04-05 DIAGNOSIS — D631 Anemia in chronic kidney disease: Secondary | ICD-10-CM | POA: Diagnosis not present

## 2015-04-05 DIAGNOSIS — N186 End stage renal disease: Secondary | ICD-10-CM | POA: Diagnosis not present

## 2015-04-06 DIAGNOSIS — N186 End stage renal disease: Secondary | ICD-10-CM | POA: Diagnosis not present

## 2015-04-06 DIAGNOSIS — D631 Anemia in chronic kidney disease: Secondary | ICD-10-CM | POA: Diagnosis not present

## 2015-04-07 DIAGNOSIS — I10 Essential (primary) hypertension: Secondary | ICD-10-CM | POA: Diagnosis not present

## 2015-04-07 DIAGNOSIS — S37009A Unspecified injury of unspecified kidney, initial encounter: Secondary | ICD-10-CM | POA: Diagnosis not present

## 2015-04-07 DIAGNOSIS — N186 End stage renal disease: Secondary | ICD-10-CM | POA: Diagnosis not present

## 2015-04-07 DIAGNOSIS — E039 Hypothyroidism, unspecified: Secondary | ICD-10-CM | POA: Diagnosis not present

## 2015-04-07 DIAGNOSIS — D631 Anemia in chronic kidney disease: Secondary | ICD-10-CM | POA: Diagnosis not present

## 2015-04-07 DIAGNOSIS — N259 Disorder resulting from impaired renal tubular function, unspecified: Secondary | ICD-10-CM | POA: Diagnosis not present

## 2015-04-07 DIAGNOSIS — F329 Major depressive disorder, single episode, unspecified: Secondary | ICD-10-CM | POA: Diagnosis not present

## 2015-04-07 DIAGNOSIS — F310 Bipolar disorder, current episode hypomanic: Secondary | ICD-10-CM | POA: Diagnosis not present

## 2015-04-07 DIAGNOSIS — H6120 Impacted cerumen, unspecified ear: Secondary | ICD-10-CM | POA: Diagnosis not present

## 2015-04-08 DIAGNOSIS — D631 Anemia in chronic kidney disease: Secondary | ICD-10-CM | POA: Diagnosis not present

## 2015-04-08 DIAGNOSIS — N186 End stage renal disease: Secondary | ICD-10-CM | POA: Diagnosis not present

## 2015-04-09 DIAGNOSIS — D631 Anemia in chronic kidney disease: Secondary | ICD-10-CM | POA: Diagnosis not present

## 2015-04-09 DIAGNOSIS — N186 End stage renal disease: Secondary | ICD-10-CM | POA: Diagnosis not present

## 2015-04-10 DIAGNOSIS — N186 End stage renal disease: Secondary | ICD-10-CM | POA: Diagnosis not present

## 2015-04-10 DIAGNOSIS — D631 Anemia in chronic kidney disease: Secondary | ICD-10-CM | POA: Diagnosis not present

## 2015-04-11 DIAGNOSIS — N186 End stage renal disease: Secondary | ICD-10-CM | POA: Diagnosis not present

## 2015-04-11 DIAGNOSIS — D631 Anemia in chronic kidney disease: Secondary | ICD-10-CM | POA: Diagnosis not present

## 2015-04-12 DIAGNOSIS — D631 Anemia in chronic kidney disease: Secondary | ICD-10-CM | POA: Diagnosis not present

## 2015-04-12 DIAGNOSIS — N186 End stage renal disease: Secondary | ICD-10-CM | POA: Diagnosis not present

## 2015-04-13 DIAGNOSIS — D631 Anemia in chronic kidney disease: Secondary | ICD-10-CM | POA: Diagnosis not present

## 2015-04-13 DIAGNOSIS — N186 End stage renal disease: Secondary | ICD-10-CM | POA: Diagnosis not present

## 2015-04-14 DIAGNOSIS — N186 End stage renal disease: Secondary | ICD-10-CM | POA: Diagnosis not present

## 2015-04-15 DIAGNOSIS — D631 Anemia in chronic kidney disease: Secondary | ICD-10-CM | POA: Diagnosis not present

## 2015-04-15 DIAGNOSIS — N186 End stage renal disease: Secondary | ICD-10-CM | POA: Diagnosis not present

## 2015-04-16 DIAGNOSIS — D631 Anemia in chronic kidney disease: Secondary | ICD-10-CM | POA: Diagnosis not present

## 2015-04-16 DIAGNOSIS — N186 End stage renal disease: Secondary | ICD-10-CM | POA: Diagnosis not present

## 2015-04-17 DIAGNOSIS — D631 Anemia in chronic kidney disease: Secondary | ICD-10-CM | POA: Diagnosis not present

## 2015-04-17 DIAGNOSIS — N186 End stage renal disease: Secondary | ICD-10-CM | POA: Diagnosis not present

## 2015-04-18 DIAGNOSIS — D631 Anemia in chronic kidney disease: Secondary | ICD-10-CM | POA: Diagnosis not present

## 2015-04-18 DIAGNOSIS — N186 End stage renal disease: Secondary | ICD-10-CM | POA: Diagnosis not present

## 2015-04-19 DIAGNOSIS — D631 Anemia in chronic kidney disease: Secondary | ICD-10-CM | POA: Diagnosis not present

## 2015-04-19 DIAGNOSIS — N186 End stage renal disease: Secondary | ICD-10-CM | POA: Diagnosis not present

## 2015-04-20 DIAGNOSIS — D631 Anemia in chronic kidney disease: Secondary | ICD-10-CM | POA: Diagnosis not present

## 2015-04-20 DIAGNOSIS — N186 End stage renal disease: Secondary | ICD-10-CM | POA: Diagnosis not present

## 2015-04-21 DIAGNOSIS — N186 End stage renal disease: Secondary | ICD-10-CM | POA: Diagnosis not present

## 2015-04-21 DIAGNOSIS — D631 Anemia in chronic kidney disease: Secondary | ICD-10-CM | POA: Diagnosis not present

## 2015-04-22 DIAGNOSIS — N186 End stage renal disease: Secondary | ICD-10-CM | POA: Diagnosis not present

## 2015-04-22 DIAGNOSIS — D631 Anemia in chronic kidney disease: Secondary | ICD-10-CM | POA: Diagnosis not present

## 2015-04-23 DIAGNOSIS — N186 End stage renal disease: Secondary | ICD-10-CM | POA: Diagnosis not present

## 2015-04-23 DIAGNOSIS — D631 Anemia in chronic kidney disease: Secondary | ICD-10-CM | POA: Diagnosis not present

## 2015-04-24 DIAGNOSIS — N186 End stage renal disease: Secondary | ICD-10-CM | POA: Diagnosis not present

## 2015-04-24 DIAGNOSIS — D631 Anemia in chronic kidney disease: Secondary | ICD-10-CM | POA: Diagnosis not present

## 2015-04-25 DIAGNOSIS — N186 End stage renal disease: Secondary | ICD-10-CM | POA: Diagnosis not present

## 2015-04-25 DIAGNOSIS — D631 Anemia in chronic kidney disease: Secondary | ICD-10-CM | POA: Diagnosis not present

## 2015-04-26 DIAGNOSIS — D631 Anemia in chronic kidney disease: Secondary | ICD-10-CM | POA: Diagnosis not present

## 2015-04-26 DIAGNOSIS — N186 End stage renal disease: Secondary | ICD-10-CM | POA: Diagnosis not present

## 2015-04-27 DIAGNOSIS — D631 Anemia in chronic kidney disease: Secondary | ICD-10-CM | POA: Diagnosis not present

## 2015-04-27 DIAGNOSIS — N186 End stage renal disease: Secondary | ICD-10-CM | POA: Diagnosis not present

## 2015-04-28 DIAGNOSIS — D631 Anemia in chronic kidney disease: Secondary | ICD-10-CM | POA: Diagnosis not present

## 2015-04-28 DIAGNOSIS — N186 End stage renal disease: Secondary | ICD-10-CM | POA: Diagnosis not present

## 2015-04-29 DIAGNOSIS — D631 Anemia in chronic kidney disease: Secondary | ICD-10-CM | POA: Diagnosis not present

## 2015-04-29 DIAGNOSIS — N186 End stage renal disease: Secondary | ICD-10-CM | POA: Diagnosis not present

## 2015-04-30 DIAGNOSIS — N186 End stage renal disease: Secondary | ICD-10-CM | POA: Diagnosis not present

## 2015-05-01 DIAGNOSIS — N186 End stage renal disease: Secondary | ICD-10-CM | POA: Diagnosis not present

## 2015-05-02 ENCOUNTER — Encounter: Payer: Self-pay | Admitting: Family Medicine

## 2015-05-02 ENCOUNTER — Ambulatory Visit (INDEPENDENT_AMBULATORY_CARE_PROVIDER_SITE_OTHER): Payer: Medicare Other | Admitting: Family Medicine

## 2015-05-02 VITALS — BP 141/80 | HR 86 | Temp 97.8°F | Ht 63.0 in | Wt 152.8 lb

## 2015-05-02 DIAGNOSIS — Z992 Dependence on renal dialysis: Secondary | ICD-10-CM | POA: Diagnosis not present

## 2015-05-02 DIAGNOSIS — R109 Unspecified abdominal pain: Secondary | ICD-10-CM | POA: Diagnosis not present

## 2015-05-02 DIAGNOSIS — N183 Chronic kidney disease, stage 3 (moderate): Secondary | ICD-10-CM | POA: Diagnosis not present

## 2015-05-02 DIAGNOSIS — D649 Anemia, unspecified: Secondary | ICD-10-CM

## 2015-05-02 DIAGNOSIS — N186 End stage renal disease: Secondary | ICD-10-CM | POA: Diagnosis not present

## 2015-05-02 DIAGNOSIS — H6121 Impacted cerumen, right ear: Secondary | ICD-10-CM

## 2015-05-02 LAB — URINALYSIS, COMPLETE
BILIRUBIN UA: NEGATIVE
Glucose, UA: NEGATIVE
Ketones, UA: NEGATIVE
NITRITE UA: NEGATIVE
PH UA: 7 (ref 5.0–7.5)
Specific Gravity, UA: 1.01 (ref 1.005–1.030)
UUROB: 0.2 mg/dL (ref 0.2–1.0)

## 2015-05-02 LAB — MICROSCOPIC EXAMINATION

## 2015-05-02 MED ORDER — CIPROFLOXACIN HCL 500 MG PO TABS: 500.0000 mg | ORAL_TABLET | Freq: Two times a day (BID) | ORAL | Status: DC

## 2015-05-02 NOTE — Addendum Note (Signed)
Addended by: Claretta Fraise on: 05/02/2015 03:09 PM   Modules accepted: Orders

## 2015-05-02 NOTE — Progress Notes (Signed)
Subjective:  Patient ID: Darren Dunn, male    DOB: 1945-06-21  Age: 70 y.o. MRN: [id number]  CC: Establish Care   HPI [facility] presents for ears clogged. Left flank pain. Mild. Intermittent. Occurring since back surgery in [phone].  History Darren Dunn has a past medical history of Chronic kidney disease; Bipolar 1 disorder ([place]); and Celiac disease.   He has past surgical history that includes Spine surgery; Tonsillectomy; and Brain surgery.   His family history includes Cancer in his father; Hypertension in his mother.He reports that he has never smoked. He does not have any smokeless tobacco history on file. He reports that he does not drink alcohol or use illicit drugs.  Outpatient Encounter Prescriptions as of 05/02/2015  Medication Sig  . calcitRIOL (ROCALTROL) 0.5 MCG capsule Take 0.5 mcg by mouth daily.  . cholecalciferol (VITAMIN D) 1000 units tablet Take 1,000 Units by mouth daily.  . cinacalcet (SENSIPAR) 30 MG tablet Take 30 mg by mouth daily.  . divalproex (DEPAKOTE) 250 MG DR tablet Take 250 mg by mouth 2 (two) times daily.  . haloperidol (HALDOL) 2 MG tablet Take 2 mg by mouth daily.  Marland Kitchen levothyroxine (SYNTHROID, LEVOTHROID) 25 MCG tablet Take 25 mcg by mouth daily before breakfast.  . [DISCONTINUED] haloperidol decanoate (HALDOL DECANOATE) 50 MG/ML injection Inject 50 mg into the muscle every 28 days. at the family practice center   . [DISCONTINUED] ranitidine (ZANTAC) 150 MG capsule 150 mg by PEG Tube route daily.    . [DISCONTINUED] Valproic Acid LIQD 250 mg by PEG Tube route every morning. and 500 mg at night by PEG tube    No facility-administered encounter medications on file as of 05/02/2015.     ROS Review of Systems  Constitutional: Negative for fever, activity change and appetite change.  HENT: Positive for ear pain and hearing loss (not recent wears aides bilat). Negative for ear discharge.   Respiratory: Negative for shortness of breath.     Cardiovascular: Negative for chest pain.  Gastrointestinal: Negative for abdominal pain.  Musculoskeletal: Negative for arthralgias.    Objective:  BP 141/80 mmHg  Pulse 86  Temp(Src) 97.8 F (36.6 C) (Oral)  Ht '5\' 3"'$  (1.6 m)  Wt 152 lb 12.8 oz (69.31 kg)  BMI 27.07 kg/m2  SpO2 100%  Physical Exam  Assessment & Plan:   Darren Dunn was seen today for establish care.  Diagnoses and all orders for this visit:  Left flank pain -     CBC with Differential/Platelet -     CMP14+EGFR -     Urinalysis, Complete -     Urine culture  ANEMIA NOS -     CBC with Differential/Platelet  CHRONIC KIDNEY DISEASE STAGE III (MODERATE) -     CMP14+EGFR -     Urinalysis, Complete  Cerumen impaction, right  I have discontinued Darren Dunn's haloperidol decanoate, Valproic Acid, and ranitidine. I am also having him maintain his divalproex, haloperidol, levothyroxine, cinacalcet, calcitRIOL, and cholecalciferol.  Meds ordered this encounter  Medications  . divalproex (DEPAKOTE) 250 MG DR tablet    Sig: Take 250 mg by mouth 2 (two) times daily.  . haloperidol (HALDOL) 2 MG tablet    Sig: Take 2 mg by mouth daily.  Marland Kitchen levothyroxine (SYNTHROID, LEVOTHROID) 25 MCG tablet    Sig: Take 25 mcg by mouth daily before breakfast.  . cinacalcet (SENSIPAR) 30 MG tablet    Sig: Take 30 mg by mouth daily.  . calcitRIOL (ROCALTROL) 0.5  MCG capsule    Sig: Take 0.5 mcg by mouth daily.  . cholecalciferol (VITAMIN D) 1000 units tablet    Sig: Take 1,000 Units by mouth daily.     Follow-up: Return if symptoms worsen or fail to improve.  Claretta Fraise, M.D.

## 2015-05-03 DIAGNOSIS — N186 End stage renal disease: Secondary | ICD-10-CM | POA: Diagnosis not present

## 2015-05-03 LAB — URINE CULTURE

## 2015-05-04 DIAGNOSIS — N186 End stage renal disease: Secondary | ICD-10-CM | POA: Diagnosis not present

## 2015-05-09 DIAGNOSIS — N2589 Other disorders resulting from impaired renal tubular function: Secondary | ICD-10-CM | POA: Diagnosis not present

## 2015-05-09 DIAGNOSIS — Z4932 Encounter for adequacy testing for peritoneal dialysis: Secondary | ICD-10-CM | POA: Diagnosis not present

## 2015-05-09 DIAGNOSIS — Z992 Dependence on renal dialysis: Secondary | ICD-10-CM | POA: Diagnosis not present

## 2015-05-09 DIAGNOSIS — N186 End stage renal disease: Secondary | ICD-10-CM | POA: Diagnosis not present

## 2015-05-09 DIAGNOSIS — I7789 Other specified disorders of arteries and arterioles: Secondary | ICD-10-CM | POA: Diagnosis not present

## 2015-05-09 DIAGNOSIS — D631 Anemia in chronic kidney disease: Secondary | ICD-10-CM | POA: Diagnosis not present

## 2015-05-09 DIAGNOSIS — R880 Cloudy (hemodialysis) (peritoneal) dialysis effluent: Secondary | ICD-10-CM | POA: Diagnosis not present

## 2015-05-09 DIAGNOSIS — D509 Iron deficiency anemia, unspecified: Secondary | ICD-10-CM | POA: Diagnosis not present

## 2015-05-10 DIAGNOSIS — D513 Other dietary vitamin B12 deficiency anemia: Secondary | ICD-10-CM | POA: Diagnosis not present

## 2015-05-10 DIAGNOSIS — R8299 Other abnormal findings in urine: Secondary | ICD-10-CM | POA: Diagnosis not present

## 2015-05-10 DIAGNOSIS — K769 Liver disease, unspecified: Secondary | ICD-10-CM | POA: Diagnosis not present

## 2015-05-10 DIAGNOSIS — N186 End stage renal disease: Secondary | ICD-10-CM | POA: Diagnosis not present

## 2015-05-10 DIAGNOSIS — D631 Anemia in chronic kidney disease: Secondary | ICD-10-CM | POA: Diagnosis not present

## 2015-05-10 DIAGNOSIS — E784 Other hyperlipidemia: Secondary | ICD-10-CM | POA: Diagnosis not present

## 2015-05-10 DIAGNOSIS — N2581 Secondary hyperparathyroidism of renal origin: Secondary | ICD-10-CM | POA: Diagnosis not present

## 2015-05-10 DIAGNOSIS — Z4932 Encounter for adequacy testing for peritoneal dialysis: Secondary | ICD-10-CM | POA: Diagnosis not present

## 2015-05-10 DIAGNOSIS — D509 Iron deficiency anemia, unspecified: Secondary | ICD-10-CM | POA: Diagnosis not present

## 2015-05-10 DIAGNOSIS — N2589 Other disorders resulting from impaired renal tubular function: Secondary | ICD-10-CM | POA: Diagnosis not present

## 2015-05-10 DIAGNOSIS — R880 Cloudy (hemodialysis) (peritoneal) dialysis effluent: Secondary | ICD-10-CM | POA: Diagnosis not present

## 2015-05-10 DIAGNOSIS — D630 Anemia in neoplastic disease: Secondary | ICD-10-CM | POA: Diagnosis not present

## 2015-05-11 DIAGNOSIS — R880 Cloudy (hemodialysis) (peritoneal) dialysis effluent: Secondary | ICD-10-CM | POA: Diagnosis not present

## 2015-05-11 DIAGNOSIS — N186 End stage renal disease: Secondary | ICD-10-CM | POA: Diagnosis not present

## 2015-05-12 DIAGNOSIS — N186 End stage renal disease: Secondary | ICD-10-CM | POA: Diagnosis not present

## 2015-05-12 DIAGNOSIS — R880 Cloudy (hemodialysis) (peritoneal) dialysis effluent: Secondary | ICD-10-CM | POA: Diagnosis not present

## 2015-05-13 DIAGNOSIS — N186 End stage renal disease: Secondary | ICD-10-CM | POA: Diagnosis not present

## 2015-05-13 DIAGNOSIS — K650 Generalized (acute) peritonitis: Secondary | ICD-10-CM | POA: Diagnosis not present

## 2015-05-14 DIAGNOSIS — N2589 Other disorders resulting from impaired renal tubular function: Secondary | ICD-10-CM | POA: Diagnosis not present

## 2015-05-14 DIAGNOSIS — N186 End stage renal disease: Secondary | ICD-10-CM | POA: Diagnosis not present

## 2015-05-14 DIAGNOSIS — D509 Iron deficiency anemia, unspecified: Secondary | ICD-10-CM | POA: Diagnosis not present

## 2015-05-14 DIAGNOSIS — D631 Anemia in chronic kidney disease: Secondary | ICD-10-CM | POA: Diagnosis not present

## 2015-05-14 DIAGNOSIS — R880 Cloudy (hemodialysis) (peritoneal) dialysis effluent: Secondary | ICD-10-CM | POA: Diagnosis not present

## 2015-05-14 DIAGNOSIS — Z4932 Encounter for adequacy testing for peritoneal dialysis: Secondary | ICD-10-CM | POA: Diagnosis not present

## 2015-05-15 DIAGNOSIS — N2589 Other disorders resulting from impaired renal tubular function: Secondary | ICD-10-CM | POA: Diagnosis not present

## 2015-05-15 DIAGNOSIS — R880 Cloudy (hemodialysis) (peritoneal) dialysis effluent: Secondary | ICD-10-CM | POA: Diagnosis not present

## 2015-05-15 DIAGNOSIS — Z4932 Encounter for adequacy testing for peritoneal dialysis: Secondary | ICD-10-CM | POA: Diagnosis not present

## 2015-05-15 DIAGNOSIS — N186 End stage renal disease: Secondary | ICD-10-CM | POA: Diagnosis not present

## 2015-05-15 DIAGNOSIS — D631 Anemia in chronic kidney disease: Secondary | ICD-10-CM | POA: Diagnosis not present

## 2015-05-15 DIAGNOSIS — D509 Iron deficiency anemia, unspecified: Secondary | ICD-10-CM | POA: Diagnosis not present

## 2015-05-16 DIAGNOSIS — K650 Generalized (acute) peritonitis: Secondary | ICD-10-CM | POA: Diagnosis not present

## 2015-05-16 DIAGNOSIS — F319 Bipolar disorder, unspecified: Secondary | ICD-10-CM | POA: Diagnosis not present

## 2015-05-16 DIAGNOSIS — N186 End stage renal disease: Secondary | ICD-10-CM | POA: Diagnosis not present

## 2015-05-17 DIAGNOSIS — D631 Anemia in chronic kidney disease: Secondary | ICD-10-CM | POA: Diagnosis not present

## 2015-05-17 DIAGNOSIS — R880 Cloudy (hemodialysis) (peritoneal) dialysis effluent: Secondary | ICD-10-CM | POA: Diagnosis not present

## 2015-05-17 DIAGNOSIS — Z4932 Encounter for adequacy testing for peritoneal dialysis: Secondary | ICD-10-CM | POA: Diagnosis not present

## 2015-05-17 DIAGNOSIS — N186 End stage renal disease: Secondary | ICD-10-CM | POA: Diagnosis not present

## 2015-05-17 DIAGNOSIS — D509 Iron deficiency anemia, unspecified: Secondary | ICD-10-CM | POA: Diagnosis not present

## 2015-05-17 DIAGNOSIS — N2589 Other disorders resulting from impaired renal tubular function: Secondary | ICD-10-CM | POA: Diagnosis not present

## 2015-05-18 DIAGNOSIS — R880 Cloudy (hemodialysis) (peritoneal) dialysis effluent: Secondary | ICD-10-CM | POA: Diagnosis not present

## 2015-05-18 DIAGNOSIS — D509 Iron deficiency anemia, unspecified: Secondary | ICD-10-CM | POA: Diagnosis not present

## 2015-05-18 DIAGNOSIS — Z4932 Encounter for adequacy testing for peritoneal dialysis: Secondary | ICD-10-CM | POA: Diagnosis not present

## 2015-05-18 DIAGNOSIS — N2589 Other disorders resulting from impaired renal tubular function: Secondary | ICD-10-CM | POA: Diagnosis not present

## 2015-05-18 DIAGNOSIS — D631 Anemia in chronic kidney disease: Secondary | ICD-10-CM | POA: Diagnosis not present

## 2015-05-18 DIAGNOSIS — N186 End stage renal disease: Secondary | ICD-10-CM | POA: Diagnosis not present

## 2015-05-19 DIAGNOSIS — Z4932 Encounter for adequacy testing for peritoneal dialysis: Secondary | ICD-10-CM | POA: Diagnosis not present

## 2015-05-19 DIAGNOSIS — R880 Cloudy (hemodialysis) (peritoneal) dialysis effluent: Secondary | ICD-10-CM | POA: Diagnosis not present

## 2015-05-19 DIAGNOSIS — N186 End stage renal disease: Secondary | ICD-10-CM | POA: Diagnosis not present

## 2015-05-19 DIAGNOSIS — Z029 Encounter for administrative examinations, unspecified: Secondary | ICD-10-CM

## 2015-05-19 DIAGNOSIS — D631 Anemia in chronic kidney disease: Secondary | ICD-10-CM | POA: Diagnosis not present

## 2015-05-19 DIAGNOSIS — D509 Iron deficiency anemia, unspecified: Secondary | ICD-10-CM | POA: Diagnosis not present

## 2015-05-19 DIAGNOSIS — N2589 Other disorders resulting from impaired renal tubular function: Secondary | ICD-10-CM | POA: Diagnosis not present

## 2015-05-20 DIAGNOSIS — D631 Anemia in chronic kidney disease: Secondary | ICD-10-CM | POA: Diagnosis not present

## 2015-05-20 DIAGNOSIS — K650 Generalized (acute) peritonitis: Secondary | ICD-10-CM | POA: Diagnosis not present

## 2015-05-20 DIAGNOSIS — D509 Iron deficiency anemia, unspecified: Secondary | ICD-10-CM | POA: Diagnosis not present

## 2015-05-20 DIAGNOSIS — N186 End stage renal disease: Secondary | ICD-10-CM | POA: Diagnosis not present

## 2015-05-20 DIAGNOSIS — Z4932 Encounter for adequacy testing for peritoneal dialysis: Secondary | ICD-10-CM | POA: Diagnosis not present

## 2015-05-20 DIAGNOSIS — R880 Cloudy (hemodialysis) (peritoneal) dialysis effluent: Secondary | ICD-10-CM | POA: Diagnosis not present

## 2015-05-20 DIAGNOSIS — N2589 Other disorders resulting from impaired renal tubular function: Secondary | ICD-10-CM | POA: Diagnosis not present

## 2015-05-21 DIAGNOSIS — N2589 Other disorders resulting from impaired renal tubular function: Secondary | ICD-10-CM | POA: Diagnosis not present

## 2015-05-21 DIAGNOSIS — N186 End stage renal disease: Secondary | ICD-10-CM | POA: Diagnosis not present

## 2015-05-21 DIAGNOSIS — D631 Anemia in chronic kidney disease: Secondary | ICD-10-CM | POA: Diagnosis not present

## 2015-05-21 DIAGNOSIS — Z4932 Encounter for adequacy testing for peritoneal dialysis: Secondary | ICD-10-CM | POA: Diagnosis not present

## 2015-05-21 DIAGNOSIS — D509 Iron deficiency anemia, unspecified: Secondary | ICD-10-CM | POA: Diagnosis not present

## 2015-05-21 DIAGNOSIS — R880 Cloudy (hemodialysis) (peritoneal) dialysis effluent: Secondary | ICD-10-CM | POA: Diagnosis not present

## 2015-05-22 DIAGNOSIS — N2589 Other disorders resulting from impaired renal tubular function: Secondary | ICD-10-CM | POA: Diagnosis not present

## 2015-05-22 DIAGNOSIS — Z4932 Encounter for adequacy testing for peritoneal dialysis: Secondary | ICD-10-CM | POA: Diagnosis not present

## 2015-05-22 DIAGNOSIS — D509 Iron deficiency anemia, unspecified: Secondary | ICD-10-CM | POA: Diagnosis not present

## 2015-05-22 DIAGNOSIS — D631 Anemia in chronic kidney disease: Secondary | ICD-10-CM | POA: Diagnosis not present

## 2015-05-22 DIAGNOSIS — N186 End stage renal disease: Secondary | ICD-10-CM | POA: Diagnosis not present

## 2015-05-22 DIAGNOSIS — R880 Cloudy (hemodialysis) (peritoneal) dialysis effluent: Secondary | ICD-10-CM | POA: Diagnosis not present

## 2015-05-23 DIAGNOSIS — Z4932 Encounter for adequacy testing for peritoneal dialysis: Secondary | ICD-10-CM | POA: Diagnosis not present

## 2015-05-23 DIAGNOSIS — D509 Iron deficiency anemia, unspecified: Secondary | ICD-10-CM | POA: Diagnosis not present

## 2015-05-23 DIAGNOSIS — D631 Anemia in chronic kidney disease: Secondary | ICD-10-CM | POA: Diagnosis not present

## 2015-05-23 DIAGNOSIS — N2589 Other disorders resulting from impaired renal tubular function: Secondary | ICD-10-CM | POA: Diagnosis not present

## 2015-05-23 DIAGNOSIS — N186 End stage renal disease: Secondary | ICD-10-CM | POA: Diagnosis not present

## 2015-05-23 DIAGNOSIS — R880 Cloudy (hemodialysis) (peritoneal) dialysis effluent: Secondary | ICD-10-CM | POA: Diagnosis not present

## 2015-05-24 DIAGNOSIS — N186 End stage renal disease: Secondary | ICD-10-CM | POA: Diagnosis not present

## 2015-05-24 DIAGNOSIS — Z4932 Encounter for adequacy testing for peritoneal dialysis: Secondary | ICD-10-CM | POA: Diagnosis not present

## 2015-05-24 DIAGNOSIS — N2589 Other disorders resulting from impaired renal tubular function: Secondary | ICD-10-CM | POA: Diagnosis not present

## 2015-05-24 DIAGNOSIS — R880 Cloudy (hemodialysis) (peritoneal) dialysis effluent: Secondary | ICD-10-CM | POA: Diagnosis not present

## 2015-05-24 DIAGNOSIS — D509 Iron deficiency anemia, unspecified: Secondary | ICD-10-CM | POA: Diagnosis not present

## 2015-05-24 DIAGNOSIS — D631 Anemia in chronic kidney disease: Secondary | ICD-10-CM | POA: Diagnosis not present

## 2015-05-25 DIAGNOSIS — D509 Iron deficiency anemia, unspecified: Secondary | ICD-10-CM | POA: Diagnosis not present

## 2015-05-25 DIAGNOSIS — N2589 Other disorders resulting from impaired renal tubular function: Secondary | ICD-10-CM | POA: Diagnosis not present

## 2015-05-25 DIAGNOSIS — Z4932 Encounter for adequacy testing for peritoneal dialysis: Secondary | ICD-10-CM | POA: Diagnosis not present

## 2015-05-25 DIAGNOSIS — R880 Cloudy (hemodialysis) (peritoneal) dialysis effluent: Secondary | ICD-10-CM | POA: Diagnosis not present

## 2015-05-25 DIAGNOSIS — D631 Anemia in chronic kidney disease: Secondary | ICD-10-CM | POA: Diagnosis not present

## 2015-05-25 DIAGNOSIS — N186 End stage renal disease: Secondary | ICD-10-CM | POA: Diagnosis not present

## 2015-05-26 DIAGNOSIS — D509 Iron deficiency anemia, unspecified: Secondary | ICD-10-CM | POA: Diagnosis not present

## 2015-05-26 DIAGNOSIS — R880 Cloudy (hemodialysis) (peritoneal) dialysis effluent: Secondary | ICD-10-CM | POA: Diagnosis not present

## 2015-05-26 DIAGNOSIS — N2589 Other disorders resulting from impaired renal tubular function: Secondary | ICD-10-CM | POA: Diagnosis not present

## 2015-05-26 DIAGNOSIS — N186 End stage renal disease: Secondary | ICD-10-CM | POA: Diagnosis not present

## 2015-05-26 DIAGNOSIS — Z4932 Encounter for adequacy testing for peritoneal dialysis: Secondary | ICD-10-CM | POA: Diagnosis not present

## 2015-05-26 DIAGNOSIS — D631 Anemia in chronic kidney disease: Secondary | ICD-10-CM | POA: Diagnosis not present

## 2015-05-27 DIAGNOSIS — R880 Cloudy (hemodialysis) (peritoneal) dialysis effluent: Secondary | ICD-10-CM | POA: Diagnosis not present

## 2015-05-27 DIAGNOSIS — N186 End stage renal disease: Secondary | ICD-10-CM | POA: Diagnosis not present

## 2015-05-27 DIAGNOSIS — N2589 Other disorders resulting from impaired renal tubular function: Secondary | ICD-10-CM | POA: Diagnosis not present

## 2015-05-27 DIAGNOSIS — Z4932 Encounter for adequacy testing for peritoneal dialysis: Secondary | ICD-10-CM | POA: Diagnosis not present

## 2015-05-27 DIAGNOSIS — D509 Iron deficiency anemia, unspecified: Secondary | ICD-10-CM | POA: Diagnosis not present

## 2015-05-27 DIAGNOSIS — D631 Anemia in chronic kidney disease: Secondary | ICD-10-CM | POA: Diagnosis not present

## 2015-05-28 DIAGNOSIS — D509 Iron deficiency anemia, unspecified: Secondary | ICD-10-CM | POA: Diagnosis not present

## 2015-05-28 DIAGNOSIS — D631 Anemia in chronic kidney disease: Secondary | ICD-10-CM | POA: Diagnosis not present

## 2015-05-28 DIAGNOSIS — Z4932 Encounter for adequacy testing for peritoneal dialysis: Secondary | ICD-10-CM | POA: Diagnosis not present

## 2015-05-28 DIAGNOSIS — R880 Cloudy (hemodialysis) (peritoneal) dialysis effluent: Secondary | ICD-10-CM | POA: Diagnosis not present

## 2015-05-28 DIAGNOSIS — N186 End stage renal disease: Secondary | ICD-10-CM | POA: Diagnosis not present

## 2015-05-28 DIAGNOSIS — N2589 Other disorders resulting from impaired renal tubular function: Secondary | ICD-10-CM | POA: Diagnosis not present

## 2015-05-29 DIAGNOSIS — N186 End stage renal disease: Secondary | ICD-10-CM | POA: Diagnosis not present

## 2015-05-29 DIAGNOSIS — D509 Iron deficiency anemia, unspecified: Secondary | ICD-10-CM | POA: Diagnosis not present

## 2015-05-29 DIAGNOSIS — N2589 Other disorders resulting from impaired renal tubular function: Secondary | ICD-10-CM | POA: Diagnosis not present

## 2015-05-29 DIAGNOSIS — R880 Cloudy (hemodialysis) (peritoneal) dialysis effluent: Secondary | ICD-10-CM | POA: Diagnosis not present

## 2015-05-29 DIAGNOSIS — Z4932 Encounter for adequacy testing for peritoneal dialysis: Secondary | ICD-10-CM | POA: Diagnosis not present

## 2015-05-29 DIAGNOSIS — D631 Anemia in chronic kidney disease: Secondary | ICD-10-CM | POA: Diagnosis not present

## 2015-05-30 DIAGNOSIS — N186 End stage renal disease: Secondary | ICD-10-CM | POA: Diagnosis not present

## 2015-05-30 DIAGNOSIS — D509 Iron deficiency anemia, unspecified: Secondary | ICD-10-CM | POA: Diagnosis not present

## 2015-05-30 DIAGNOSIS — Z4932 Encounter for adequacy testing for peritoneal dialysis: Secondary | ICD-10-CM | POA: Diagnosis not present

## 2015-05-30 DIAGNOSIS — R17 Unspecified jaundice: Secondary | ICD-10-CM | POA: Diagnosis not present

## 2015-05-30 DIAGNOSIS — N2581 Secondary hyperparathyroidism of renal origin: Secondary | ICD-10-CM | POA: Diagnosis not present

## 2015-05-30 DIAGNOSIS — Z79899 Other long term (current) drug therapy: Secondary | ICD-10-CM | POA: Diagnosis not present

## 2015-05-30 DIAGNOSIS — D631 Anemia in chronic kidney disease: Secondary | ICD-10-CM | POA: Diagnosis not present

## 2015-05-30 DIAGNOSIS — E440 Moderate protein-calorie malnutrition: Secondary | ICD-10-CM | POA: Diagnosis not present

## 2015-05-30 DIAGNOSIS — K650 Generalized (acute) peritonitis: Secondary | ICD-10-CM | POA: Diagnosis not present

## 2015-05-30 DIAGNOSIS — K769 Liver disease, unspecified: Secondary | ICD-10-CM | POA: Diagnosis not present

## 2015-05-31 DIAGNOSIS — D631 Anemia in chronic kidney disease: Secondary | ICD-10-CM | POA: Diagnosis not present

## 2015-05-31 DIAGNOSIS — N186 End stage renal disease: Secondary | ICD-10-CM | POA: Diagnosis not present

## 2015-05-31 DIAGNOSIS — Z4932 Encounter for adequacy testing for peritoneal dialysis: Secondary | ICD-10-CM | POA: Diagnosis not present

## 2015-05-31 DIAGNOSIS — D509 Iron deficiency anemia, unspecified: Secondary | ICD-10-CM | POA: Diagnosis not present

## 2015-05-31 DIAGNOSIS — Z79899 Other long term (current) drug therapy: Secondary | ICD-10-CM | POA: Diagnosis not present

## 2015-05-31 DIAGNOSIS — K650 Generalized (acute) peritonitis: Secondary | ICD-10-CM | POA: Diagnosis not present

## 2015-06-01 DIAGNOSIS — N186 End stage renal disease: Secondary | ICD-10-CM | POA: Diagnosis not present

## 2015-06-01 DIAGNOSIS — Z79899 Other long term (current) drug therapy: Secondary | ICD-10-CM | POA: Diagnosis not present

## 2015-06-01 DIAGNOSIS — Z4932 Encounter for adequacy testing for peritoneal dialysis: Secondary | ICD-10-CM | POA: Diagnosis not present

## 2015-06-01 DIAGNOSIS — R8299 Other abnormal findings in urine: Secondary | ICD-10-CM | POA: Diagnosis not present

## 2015-06-01 DIAGNOSIS — D631 Anemia in chronic kidney disease: Secondary | ICD-10-CM | POA: Diagnosis not present

## 2015-06-01 DIAGNOSIS — D509 Iron deficiency anemia, unspecified: Secondary | ICD-10-CM | POA: Diagnosis not present

## 2015-06-01 DIAGNOSIS — K650 Generalized (acute) peritonitis: Secondary | ICD-10-CM | POA: Diagnosis not present

## 2015-06-02 DIAGNOSIS — Z79899 Other long term (current) drug therapy: Secondary | ICD-10-CM | POA: Diagnosis not present

## 2015-06-02 DIAGNOSIS — N186 End stage renal disease: Secondary | ICD-10-CM | POA: Diagnosis not present

## 2015-06-02 DIAGNOSIS — D631 Anemia in chronic kidney disease: Secondary | ICD-10-CM | POA: Diagnosis not present

## 2015-06-02 DIAGNOSIS — K650 Generalized (acute) peritonitis: Secondary | ICD-10-CM | POA: Diagnosis not present

## 2015-06-02 DIAGNOSIS — D509 Iron deficiency anemia, unspecified: Secondary | ICD-10-CM | POA: Diagnosis not present

## 2015-06-02 DIAGNOSIS — Z4932 Encounter for adequacy testing for peritoneal dialysis: Secondary | ICD-10-CM | POA: Diagnosis not present

## 2015-06-03 DIAGNOSIS — D631 Anemia in chronic kidney disease: Secondary | ICD-10-CM | POA: Diagnosis not present

## 2015-06-03 DIAGNOSIS — K650 Generalized (acute) peritonitis: Secondary | ICD-10-CM | POA: Diagnosis not present

## 2015-06-03 DIAGNOSIS — Z4932 Encounter for adequacy testing for peritoneal dialysis: Secondary | ICD-10-CM | POA: Diagnosis not present

## 2015-06-03 DIAGNOSIS — D509 Iron deficiency anemia, unspecified: Secondary | ICD-10-CM | POA: Diagnosis not present

## 2015-06-03 DIAGNOSIS — N186 End stage renal disease: Secondary | ICD-10-CM | POA: Diagnosis not present

## 2015-06-03 DIAGNOSIS — Z79899 Other long term (current) drug therapy: Secondary | ICD-10-CM | POA: Diagnosis not present

## 2015-06-04 DIAGNOSIS — K650 Generalized (acute) peritonitis: Secondary | ICD-10-CM | POA: Diagnosis not present

## 2015-06-04 DIAGNOSIS — Z4932 Encounter for adequacy testing for peritoneal dialysis: Secondary | ICD-10-CM | POA: Diagnosis not present

## 2015-06-04 DIAGNOSIS — Z79899 Other long term (current) drug therapy: Secondary | ICD-10-CM | POA: Diagnosis not present

## 2015-06-04 DIAGNOSIS — D509 Iron deficiency anemia, unspecified: Secondary | ICD-10-CM | POA: Diagnosis not present

## 2015-06-04 DIAGNOSIS — N186 End stage renal disease: Secondary | ICD-10-CM | POA: Diagnosis not present

## 2015-06-04 DIAGNOSIS — D631 Anemia in chronic kidney disease: Secondary | ICD-10-CM | POA: Diagnosis not present

## 2015-06-05 DIAGNOSIS — D631 Anemia in chronic kidney disease: Secondary | ICD-10-CM | POA: Diagnosis not present

## 2015-06-05 DIAGNOSIS — D509 Iron deficiency anemia, unspecified: Secondary | ICD-10-CM | POA: Diagnosis not present

## 2015-06-05 DIAGNOSIS — Z79899 Other long term (current) drug therapy: Secondary | ICD-10-CM | POA: Diagnosis not present

## 2015-06-05 DIAGNOSIS — Z4932 Encounter for adequacy testing for peritoneal dialysis: Secondary | ICD-10-CM | POA: Diagnosis not present

## 2015-06-05 DIAGNOSIS — K650 Generalized (acute) peritonitis: Secondary | ICD-10-CM | POA: Diagnosis not present

## 2015-06-05 DIAGNOSIS — N186 End stage renal disease: Secondary | ICD-10-CM | POA: Diagnosis not present

## 2015-06-06 DIAGNOSIS — Z79899 Other long term (current) drug therapy: Secondary | ICD-10-CM | POA: Diagnosis not present

## 2015-06-06 DIAGNOSIS — Z4932 Encounter for adequacy testing for peritoneal dialysis: Secondary | ICD-10-CM | POA: Diagnosis not present

## 2015-06-06 DIAGNOSIS — D631 Anemia in chronic kidney disease: Secondary | ICD-10-CM | POA: Diagnosis not present

## 2015-06-06 DIAGNOSIS — D509 Iron deficiency anemia, unspecified: Secondary | ICD-10-CM | POA: Diagnosis not present

## 2015-06-06 DIAGNOSIS — K650 Generalized (acute) peritonitis: Secondary | ICD-10-CM | POA: Diagnosis not present

## 2015-06-06 DIAGNOSIS — N186 End stage renal disease: Secondary | ICD-10-CM | POA: Diagnosis not present

## 2015-06-07 DIAGNOSIS — Z4932 Encounter for adequacy testing for peritoneal dialysis: Secondary | ICD-10-CM | POA: Diagnosis not present

## 2015-06-07 DIAGNOSIS — N186 End stage renal disease: Secondary | ICD-10-CM | POA: Diagnosis not present

## 2015-06-07 DIAGNOSIS — K650 Generalized (acute) peritonitis: Secondary | ICD-10-CM | POA: Diagnosis not present

## 2015-06-07 DIAGNOSIS — D631 Anemia in chronic kidney disease: Secondary | ICD-10-CM | POA: Diagnosis not present

## 2015-06-07 DIAGNOSIS — D509 Iron deficiency anemia, unspecified: Secondary | ICD-10-CM | POA: Diagnosis not present

## 2015-06-07 DIAGNOSIS — Z79899 Other long term (current) drug therapy: Secondary | ICD-10-CM | POA: Diagnosis not present

## 2015-06-08 DIAGNOSIS — D631 Anemia in chronic kidney disease: Secondary | ICD-10-CM | POA: Diagnosis not present

## 2015-06-08 DIAGNOSIS — N186 End stage renal disease: Secondary | ICD-10-CM | POA: Diagnosis not present

## 2015-06-08 DIAGNOSIS — D509 Iron deficiency anemia, unspecified: Secondary | ICD-10-CM | POA: Diagnosis not present

## 2015-06-08 DIAGNOSIS — K650 Generalized (acute) peritonitis: Secondary | ICD-10-CM | POA: Diagnosis not present

## 2015-06-08 DIAGNOSIS — Z79899 Other long term (current) drug therapy: Secondary | ICD-10-CM | POA: Diagnosis not present

## 2015-06-08 DIAGNOSIS — Z4932 Encounter for adequacy testing for peritoneal dialysis: Secondary | ICD-10-CM | POA: Diagnosis not present

## 2015-06-09 DIAGNOSIS — D631 Anemia in chronic kidney disease: Secondary | ICD-10-CM | POA: Diagnosis not present

## 2015-06-09 DIAGNOSIS — K650 Generalized (acute) peritonitis: Secondary | ICD-10-CM | POA: Diagnosis not present

## 2015-06-09 DIAGNOSIS — N186 End stage renal disease: Secondary | ICD-10-CM | POA: Diagnosis not present

## 2015-06-09 DIAGNOSIS — Z79899 Other long term (current) drug therapy: Secondary | ICD-10-CM | POA: Diagnosis not present

## 2015-06-09 DIAGNOSIS — Z4932 Encounter for adequacy testing for peritoneal dialysis: Secondary | ICD-10-CM | POA: Diagnosis not present

## 2015-06-09 DIAGNOSIS — D509 Iron deficiency anemia, unspecified: Secondary | ICD-10-CM | POA: Diagnosis not present

## 2015-06-10 DIAGNOSIS — Z4932 Encounter for adequacy testing for peritoneal dialysis: Secondary | ICD-10-CM | POA: Diagnosis not present

## 2015-06-10 DIAGNOSIS — K650 Generalized (acute) peritonitis: Secondary | ICD-10-CM | POA: Diagnosis not present

## 2015-06-10 DIAGNOSIS — D509 Iron deficiency anemia, unspecified: Secondary | ICD-10-CM | POA: Diagnosis not present

## 2015-06-10 DIAGNOSIS — Z79899 Other long term (current) drug therapy: Secondary | ICD-10-CM | POA: Diagnosis not present

## 2015-06-10 DIAGNOSIS — N186 End stage renal disease: Secondary | ICD-10-CM | POA: Diagnosis not present

## 2015-06-10 DIAGNOSIS — D631 Anemia in chronic kidney disease: Secondary | ICD-10-CM | POA: Diagnosis not present

## 2015-06-11 DIAGNOSIS — N186 End stage renal disease: Secondary | ICD-10-CM | POA: Diagnosis not present

## 2015-06-11 DIAGNOSIS — Z4932 Encounter for adequacy testing for peritoneal dialysis: Secondary | ICD-10-CM | POA: Diagnosis not present

## 2015-06-11 DIAGNOSIS — Z79899 Other long term (current) drug therapy: Secondary | ICD-10-CM | POA: Diagnosis not present

## 2015-06-11 DIAGNOSIS — D509 Iron deficiency anemia, unspecified: Secondary | ICD-10-CM | POA: Diagnosis not present

## 2015-06-11 DIAGNOSIS — K650 Generalized (acute) peritonitis: Secondary | ICD-10-CM | POA: Diagnosis not present

## 2015-06-11 DIAGNOSIS — D631 Anemia in chronic kidney disease: Secondary | ICD-10-CM | POA: Diagnosis not present

## 2015-06-12 DIAGNOSIS — D631 Anemia in chronic kidney disease: Secondary | ICD-10-CM | POA: Diagnosis not present

## 2015-06-12 DIAGNOSIS — Z79899 Other long term (current) drug therapy: Secondary | ICD-10-CM | POA: Diagnosis not present

## 2015-06-12 DIAGNOSIS — D509 Iron deficiency anemia, unspecified: Secondary | ICD-10-CM | POA: Diagnosis not present

## 2015-06-12 DIAGNOSIS — Z4932 Encounter for adequacy testing for peritoneal dialysis: Secondary | ICD-10-CM | POA: Diagnosis not present

## 2015-06-12 DIAGNOSIS — K650 Generalized (acute) peritonitis: Secondary | ICD-10-CM | POA: Diagnosis not present

## 2015-06-12 DIAGNOSIS — N186 End stage renal disease: Secondary | ICD-10-CM | POA: Diagnosis not present

## 2015-06-13 DIAGNOSIS — D631 Anemia in chronic kidney disease: Secondary | ICD-10-CM | POA: Diagnosis not present

## 2015-06-13 DIAGNOSIS — D509 Iron deficiency anemia, unspecified: Secondary | ICD-10-CM | POA: Diagnosis not present

## 2015-06-13 DIAGNOSIS — N186 End stage renal disease: Secondary | ICD-10-CM | POA: Diagnosis not present

## 2015-06-13 DIAGNOSIS — K650 Generalized (acute) peritonitis: Secondary | ICD-10-CM | POA: Diagnosis not present

## 2015-06-13 DIAGNOSIS — Z79899 Other long term (current) drug therapy: Secondary | ICD-10-CM | POA: Diagnosis not present

## 2015-06-13 DIAGNOSIS — Z4932 Encounter for adequacy testing for peritoneal dialysis: Secondary | ICD-10-CM | POA: Diagnosis not present

## 2015-06-14 DIAGNOSIS — D509 Iron deficiency anemia, unspecified: Secondary | ICD-10-CM | POA: Diagnosis not present

## 2015-06-14 DIAGNOSIS — D631 Anemia in chronic kidney disease: Secondary | ICD-10-CM | POA: Diagnosis not present

## 2015-06-14 DIAGNOSIS — Z79899 Other long term (current) drug therapy: Secondary | ICD-10-CM | POA: Diagnosis not present

## 2015-06-14 DIAGNOSIS — Z4932 Encounter for adequacy testing for peritoneal dialysis: Secondary | ICD-10-CM | POA: Diagnosis not present

## 2015-06-14 DIAGNOSIS — N186 End stage renal disease: Secondary | ICD-10-CM | POA: Diagnosis not present

## 2015-06-14 DIAGNOSIS — K650 Generalized (acute) peritonitis: Secondary | ICD-10-CM | POA: Diagnosis not present

## 2015-06-15 DIAGNOSIS — D631 Anemia in chronic kidney disease: Secondary | ICD-10-CM | POA: Diagnosis not present

## 2015-06-15 DIAGNOSIS — K650 Generalized (acute) peritonitis: Secondary | ICD-10-CM | POA: Diagnosis not present

## 2015-06-15 DIAGNOSIS — Z4932 Encounter for adequacy testing for peritoneal dialysis: Secondary | ICD-10-CM | POA: Diagnosis not present

## 2015-06-15 DIAGNOSIS — D509 Iron deficiency anemia, unspecified: Secondary | ICD-10-CM | POA: Diagnosis not present

## 2015-06-15 DIAGNOSIS — N186 End stage renal disease: Secondary | ICD-10-CM | POA: Diagnosis not present

## 2015-06-15 DIAGNOSIS — Z79899 Other long term (current) drug therapy: Secondary | ICD-10-CM | POA: Diagnosis not present

## 2015-06-16 DIAGNOSIS — D509 Iron deficiency anemia, unspecified: Secondary | ICD-10-CM | POA: Diagnosis not present

## 2015-06-16 DIAGNOSIS — K650 Generalized (acute) peritonitis: Secondary | ICD-10-CM | POA: Diagnosis not present

## 2015-06-16 DIAGNOSIS — Z4932 Encounter for adequacy testing for peritoneal dialysis: Secondary | ICD-10-CM | POA: Diagnosis not present

## 2015-06-16 DIAGNOSIS — D631 Anemia in chronic kidney disease: Secondary | ICD-10-CM | POA: Diagnosis not present

## 2015-06-16 DIAGNOSIS — N186 End stage renal disease: Secondary | ICD-10-CM | POA: Diagnosis not present

## 2015-06-16 DIAGNOSIS — Z79899 Other long term (current) drug therapy: Secondary | ICD-10-CM | POA: Diagnosis not present

## 2015-06-17 DIAGNOSIS — Z79899 Other long term (current) drug therapy: Secondary | ICD-10-CM | POA: Diagnosis not present

## 2015-06-17 DIAGNOSIS — D631 Anemia in chronic kidney disease: Secondary | ICD-10-CM | POA: Diagnosis not present

## 2015-06-17 DIAGNOSIS — K650 Generalized (acute) peritonitis: Secondary | ICD-10-CM | POA: Diagnosis not present

## 2015-06-17 DIAGNOSIS — Z4932 Encounter for adequacy testing for peritoneal dialysis: Secondary | ICD-10-CM | POA: Diagnosis not present

## 2015-06-17 DIAGNOSIS — D509 Iron deficiency anemia, unspecified: Secondary | ICD-10-CM | POA: Diagnosis not present

## 2015-06-17 DIAGNOSIS — N186 End stage renal disease: Secondary | ICD-10-CM | POA: Diagnosis not present

## 2015-06-18 DIAGNOSIS — Z79899 Other long term (current) drug therapy: Secondary | ICD-10-CM | POA: Diagnosis not present

## 2015-06-18 DIAGNOSIS — D631 Anemia in chronic kidney disease: Secondary | ICD-10-CM | POA: Diagnosis not present

## 2015-06-18 DIAGNOSIS — Z4932 Encounter for adequacy testing for peritoneal dialysis: Secondary | ICD-10-CM | POA: Diagnosis not present

## 2015-06-18 DIAGNOSIS — D509 Iron deficiency anemia, unspecified: Secondary | ICD-10-CM | POA: Diagnosis not present

## 2015-06-18 DIAGNOSIS — N186 End stage renal disease: Secondary | ICD-10-CM | POA: Diagnosis not present

## 2015-06-18 DIAGNOSIS — K650 Generalized (acute) peritonitis: Secondary | ICD-10-CM | POA: Diagnosis not present

## 2015-06-19 DIAGNOSIS — D631 Anemia in chronic kidney disease: Secondary | ICD-10-CM | POA: Diagnosis not present

## 2015-06-19 DIAGNOSIS — D509 Iron deficiency anemia, unspecified: Secondary | ICD-10-CM | POA: Diagnosis not present

## 2015-06-19 DIAGNOSIS — K650 Generalized (acute) peritonitis: Secondary | ICD-10-CM | POA: Diagnosis not present

## 2015-06-19 DIAGNOSIS — Z79899 Other long term (current) drug therapy: Secondary | ICD-10-CM | POA: Diagnosis not present

## 2015-06-19 DIAGNOSIS — Z4932 Encounter for adequacy testing for peritoneal dialysis: Secondary | ICD-10-CM | POA: Diagnosis not present

## 2015-06-19 DIAGNOSIS — N186 End stage renal disease: Secondary | ICD-10-CM | POA: Diagnosis not present

## 2015-06-20 DIAGNOSIS — N186 End stage renal disease: Secondary | ICD-10-CM | POA: Diagnosis not present

## 2015-06-20 DIAGNOSIS — D509 Iron deficiency anemia, unspecified: Secondary | ICD-10-CM | POA: Diagnosis not present

## 2015-06-20 DIAGNOSIS — Z4932 Encounter for adequacy testing for peritoneal dialysis: Secondary | ICD-10-CM | POA: Diagnosis not present

## 2015-06-20 DIAGNOSIS — D631 Anemia in chronic kidney disease: Secondary | ICD-10-CM | POA: Diagnosis not present

## 2015-06-20 DIAGNOSIS — Z79899 Other long term (current) drug therapy: Secondary | ICD-10-CM | POA: Diagnosis not present

## 2015-06-20 DIAGNOSIS — K650 Generalized (acute) peritonitis: Secondary | ICD-10-CM | POA: Diagnosis not present

## 2015-06-21 DIAGNOSIS — K650 Generalized (acute) peritonitis: Secondary | ICD-10-CM | POA: Diagnosis not present

## 2015-06-21 DIAGNOSIS — D631 Anemia in chronic kidney disease: Secondary | ICD-10-CM | POA: Diagnosis not present

## 2015-06-21 DIAGNOSIS — Z4932 Encounter for adequacy testing for peritoneal dialysis: Secondary | ICD-10-CM | POA: Diagnosis not present

## 2015-06-21 DIAGNOSIS — Z79899 Other long term (current) drug therapy: Secondary | ICD-10-CM | POA: Diagnosis not present

## 2015-06-21 DIAGNOSIS — N186 End stage renal disease: Secondary | ICD-10-CM | POA: Diagnosis not present

## 2015-06-21 DIAGNOSIS — D509 Iron deficiency anemia, unspecified: Secondary | ICD-10-CM | POA: Diagnosis not present

## 2015-06-22 ENCOUNTER — Ambulatory Visit (INDEPENDENT_AMBULATORY_CARE_PROVIDER_SITE_OTHER): Payer: Medicare Other | Admitting: Physician Assistant

## 2015-06-22 ENCOUNTER — Encounter: Payer: Self-pay | Admitting: Physician Assistant

## 2015-06-22 VITALS — BP 137/76 | HR 90 | Temp 97.1°F | Ht 63.0 in | Wt 141.0 lb

## 2015-06-22 DIAGNOSIS — Z4932 Encounter for adequacy testing for peritoneal dialysis: Secondary | ICD-10-CM | POA: Diagnosis not present

## 2015-06-22 DIAGNOSIS — D631 Anemia in chronic kidney disease: Secondary | ICD-10-CM | POA: Diagnosis not present

## 2015-06-22 DIAGNOSIS — D509 Iron deficiency anemia, unspecified: Secondary | ICD-10-CM | POA: Diagnosis not present

## 2015-06-22 DIAGNOSIS — S6000XA Contusion of unspecified finger without damage to nail, initial encounter: Secondary | ICD-10-CM | POA: Diagnosis not present

## 2015-06-22 DIAGNOSIS — Z79899 Other long term (current) drug therapy: Secondary | ICD-10-CM | POA: Diagnosis not present

## 2015-06-22 DIAGNOSIS — S6010XA Contusion of unspecified finger with damage to nail, initial encounter: Secondary | ICD-10-CM

## 2015-06-22 DIAGNOSIS — N186 End stage renal disease: Secondary | ICD-10-CM | POA: Diagnosis not present

## 2015-06-22 DIAGNOSIS — K650 Generalized (acute) peritonitis: Secondary | ICD-10-CM | POA: Diagnosis not present

## 2015-06-22 NOTE — Progress Notes (Signed)
Subjective:     Patient ID: Darren Dunn, male   DOB: [birth year], 70 y.o.   MRN: [id number]  HPI Pt with injury to the R thumb nail ~ 6 wks ago He had some bruising to the nail Family placed small hole in the nail with a needle Now wants me to "remove the bruise"  Review of Systems  No pain or drainage from the site    Objective:   Physical Exam  Small subungual hematoma to the distal aspect of the nail No drainage FROM of the thumb No erythema, edema, or induration to the nail fold Assessment:     1. Subungual hematoma of digit of hand, initial encounter        Plan:     Informed of nl course Continue to observe F/U prn

## 2015-06-22 NOTE — Patient Instructions (Signed)
Subungual Hematoma A subungual hematoma is a pocket of blood that collects under the fingernail or toenail. The pressure created by the blood under the nail can cause pain. CAUSES  A subungual hematoma occurs when an injury to the finger or toe causes a blood vessel beneath the nail to break. The injury can occur from a direct blow such as slamming a finger in a door. It can also occur from a repeated injury such as pressure on the foot in a shoe while running. A subungual hematoma is sometimes called runner's toe or tennis toe. SYMPTOMS   Blue or dark blue skin under the nail.  Pain or throbbing in the injured area. DIAGNOSIS  Your caregiver can determine whether you have a subungual hematoma based on your history and a physical exam. If your caregiver thinks you might have a broken (fractured) bone, X-rays may be taken. TREATMENT  Hematomas usually go away on their own over time. Your caregiver may make a hole in the nail to drain the blood. Draining the blood is painless and usually provides significant relief from pain and throbbing. The nail usually grows back normally after this procedure. In some cases, the nail may need to be removed. This is done if there is a cut under the nail that requires stitches (sutures). HOME CARE INSTRUCTIONS   Put ice on the injured area.  Put ice in a plastic bag.  Place a towel between your skin and the bag.  Leave the ice on for 15-20 minutes, 03-04 times a day for the first 1 to 2 days.  Elevate the injured area to help decrease pain and swelling.  If you were given a bandage, wear it for as long as directed by your caregiver.  If part of your nail falls off, trim the remaining nail gently. This prevents the nail from catching on something and causing further injury.  Only take over-the-counter or prescription medicines for pain, discomfort, or fever as directed by your caregiver. SEEK IMMEDIATE MEDICAL CARE IF:   You have redness or swelling  around the nail.  You have yellowish-white fluid (pus) coming from the nail.  Your pain is not controlled with medicine.  You have a fever. MAKE SURE YOU:  Understand these instructions.  Will watch your condition.  Will get help right away if you are not doing well or get worse.   This information is not intended to replace advice given to you by your health care provider. Make sure you discuss any questions you have with your health care provider.   Document Released: 01/13/2000 Document Revised: 04/09/2011 Document Reviewed: 06/02/2014 Elsevier Interactive Patient Education [employer].

## 2015-06-23 DIAGNOSIS — N186 End stage renal disease: Secondary | ICD-10-CM | POA: Diagnosis not present

## 2015-06-23 DIAGNOSIS — K650 Generalized (acute) peritonitis: Secondary | ICD-10-CM | POA: Diagnosis not present

## 2015-06-23 DIAGNOSIS — Z4932 Encounter for adequacy testing for peritoneal dialysis: Secondary | ICD-10-CM | POA: Diagnosis not present

## 2015-06-23 DIAGNOSIS — D509 Iron deficiency anemia, unspecified: Secondary | ICD-10-CM | POA: Diagnosis not present

## 2015-06-23 DIAGNOSIS — D631 Anemia in chronic kidney disease: Secondary | ICD-10-CM | POA: Diagnosis not present

## 2015-06-23 DIAGNOSIS — Z79899 Other long term (current) drug therapy: Secondary | ICD-10-CM | POA: Diagnosis not present

## 2015-06-24 DIAGNOSIS — D631 Anemia in chronic kidney disease: Secondary | ICD-10-CM | POA: Diagnosis not present

## 2015-06-24 DIAGNOSIS — N186 End stage renal disease: Secondary | ICD-10-CM | POA: Diagnosis not present

## 2015-06-24 DIAGNOSIS — Z4932 Encounter for adequacy testing for peritoneal dialysis: Secondary | ICD-10-CM | POA: Diagnosis not present

## 2015-06-24 DIAGNOSIS — D509 Iron deficiency anemia, unspecified: Secondary | ICD-10-CM | POA: Diagnosis not present

## 2015-06-24 DIAGNOSIS — K650 Generalized (acute) peritonitis: Secondary | ICD-10-CM | POA: Diagnosis not present

## 2015-06-24 DIAGNOSIS — Z79899 Other long term (current) drug therapy: Secondary | ICD-10-CM | POA: Diagnosis not present

## 2015-06-25 DIAGNOSIS — D509 Iron deficiency anemia, unspecified: Secondary | ICD-10-CM | POA: Diagnosis not present

## 2015-06-25 DIAGNOSIS — K650 Generalized (acute) peritonitis: Secondary | ICD-10-CM | POA: Diagnosis not present

## 2015-06-25 DIAGNOSIS — Z4932 Encounter for adequacy testing for peritoneal dialysis: Secondary | ICD-10-CM | POA: Diagnosis not present

## 2015-06-25 DIAGNOSIS — D631 Anemia in chronic kidney disease: Secondary | ICD-10-CM | POA: Diagnosis not present

## 2015-06-25 DIAGNOSIS — N186 End stage renal disease: Secondary | ICD-10-CM | POA: Diagnosis not present

## 2015-06-25 DIAGNOSIS — Z79899 Other long term (current) drug therapy: Secondary | ICD-10-CM | POA: Diagnosis not present

## 2015-06-26 DIAGNOSIS — D631 Anemia in chronic kidney disease: Secondary | ICD-10-CM | POA: Diagnosis not present

## 2015-06-26 DIAGNOSIS — Z79899 Other long term (current) drug therapy: Secondary | ICD-10-CM | POA: Diagnosis not present

## 2015-06-26 DIAGNOSIS — D509 Iron deficiency anemia, unspecified: Secondary | ICD-10-CM | POA: Diagnosis not present

## 2015-06-26 DIAGNOSIS — K650 Generalized (acute) peritonitis: Secondary | ICD-10-CM | POA: Diagnosis not present

## 2015-06-26 DIAGNOSIS — Z4932 Encounter for adequacy testing for peritoneal dialysis: Secondary | ICD-10-CM | POA: Diagnosis not present

## 2015-06-26 DIAGNOSIS — N186 End stage renal disease: Secondary | ICD-10-CM | POA: Diagnosis not present

## 2015-06-27 DIAGNOSIS — Z4932 Encounter for adequacy testing for peritoneal dialysis: Secondary | ICD-10-CM | POA: Diagnosis not present

## 2015-06-27 DIAGNOSIS — D509 Iron deficiency anemia, unspecified: Secondary | ICD-10-CM | POA: Diagnosis not present

## 2015-06-27 DIAGNOSIS — N186 End stage renal disease: Secondary | ICD-10-CM | POA: Diagnosis not present

## 2015-06-27 DIAGNOSIS — K650 Generalized (acute) peritonitis: Secondary | ICD-10-CM | POA: Diagnosis not present

## 2015-06-27 DIAGNOSIS — Z79899 Other long term (current) drug therapy: Secondary | ICD-10-CM | POA: Diagnosis not present

## 2015-06-27 DIAGNOSIS — D631 Anemia in chronic kidney disease: Secondary | ICD-10-CM | POA: Diagnosis not present

## 2015-06-28 DIAGNOSIS — Z4932 Encounter for adequacy testing for peritoneal dialysis: Secondary | ICD-10-CM | POA: Diagnosis not present

## 2015-06-28 DIAGNOSIS — Z79899 Other long term (current) drug therapy: Secondary | ICD-10-CM | POA: Diagnosis not present

## 2015-06-28 DIAGNOSIS — D631 Anemia in chronic kidney disease: Secondary | ICD-10-CM | POA: Diagnosis not present

## 2015-06-28 DIAGNOSIS — K650 Generalized (acute) peritonitis: Secondary | ICD-10-CM | POA: Diagnosis not present

## 2015-06-28 DIAGNOSIS — N186 End stage renal disease: Secondary | ICD-10-CM | POA: Diagnosis not present

## 2015-06-28 DIAGNOSIS — D509 Iron deficiency anemia, unspecified: Secondary | ICD-10-CM | POA: Diagnosis not present

## 2015-06-29 DIAGNOSIS — K650 Generalized (acute) peritonitis: Secondary | ICD-10-CM | POA: Diagnosis not present

## 2015-06-29 DIAGNOSIS — Z79899 Other long term (current) drug therapy: Secondary | ICD-10-CM | POA: Diagnosis not present

## 2015-06-29 DIAGNOSIS — N186 End stage renal disease: Secondary | ICD-10-CM | POA: Diagnosis not present

## 2015-06-29 DIAGNOSIS — D631 Anemia in chronic kidney disease: Secondary | ICD-10-CM | POA: Diagnosis not present

## 2015-06-29 DIAGNOSIS — Z992 Dependence on renal dialysis: Secondary | ICD-10-CM | POA: Diagnosis not present

## 2015-06-29 DIAGNOSIS — I7789 Other specified disorders of arteries and arterioles: Secondary | ICD-10-CM | POA: Diagnosis not present

## 2015-06-29 DIAGNOSIS — Z4932 Encounter for adequacy testing for peritoneal dialysis: Secondary | ICD-10-CM | POA: Diagnosis not present

## 2015-06-29 DIAGNOSIS — D509 Iron deficiency anemia, unspecified: Secondary | ICD-10-CM | POA: Diagnosis not present

## 2015-06-30 DIAGNOSIS — R17 Unspecified jaundice: Secondary | ICD-10-CM | POA: Diagnosis not present

## 2015-06-30 DIAGNOSIS — D630 Anemia in neoplastic disease: Secondary | ICD-10-CM | POA: Diagnosis not present

## 2015-06-30 DIAGNOSIS — E440 Moderate protein-calorie malnutrition: Secondary | ICD-10-CM | POA: Diagnosis not present

## 2015-06-30 DIAGNOSIS — D631 Anemia in chronic kidney disease: Secondary | ICD-10-CM | POA: Diagnosis not present

## 2015-06-30 DIAGNOSIS — K769 Liver disease, unspecified: Secondary | ICD-10-CM | POA: Diagnosis not present

## 2015-06-30 DIAGNOSIS — D509 Iron deficiency anemia, unspecified: Secondary | ICD-10-CM | POA: Diagnosis not present

## 2015-06-30 DIAGNOSIS — N186 End stage renal disease: Secondary | ICD-10-CM | POA: Diagnosis not present

## 2015-06-30 DIAGNOSIS — Z79899 Other long term (current) drug therapy: Secondary | ICD-10-CM | POA: Diagnosis not present

## 2015-06-30 DIAGNOSIS — Z4932 Encounter for adequacy testing for peritoneal dialysis: Secondary | ICD-10-CM | POA: Diagnosis not present

## 2015-07-01 DIAGNOSIS — N186 End stage renal disease: Secondary | ICD-10-CM | POA: Diagnosis not present

## 2015-07-01 DIAGNOSIS — R17 Unspecified jaundice: Secondary | ICD-10-CM | POA: Diagnosis not present

## 2015-07-01 DIAGNOSIS — R8299 Other abnormal findings in urine: Secondary | ICD-10-CM | POA: Diagnosis not present

## 2015-07-01 DIAGNOSIS — D509 Iron deficiency anemia, unspecified: Secondary | ICD-10-CM | POA: Diagnosis not present

## 2015-07-01 DIAGNOSIS — D630 Anemia in neoplastic disease: Secondary | ICD-10-CM | POA: Diagnosis not present

## 2015-07-01 DIAGNOSIS — Z79899 Other long term (current) drug therapy: Secondary | ICD-10-CM | POA: Diagnosis not present

## 2015-07-01 DIAGNOSIS — D631 Anemia in chronic kidney disease: Secondary | ICD-10-CM | POA: Diagnosis not present

## 2015-07-02 DIAGNOSIS — N186 End stage renal disease: Secondary | ICD-10-CM | POA: Diagnosis not present

## 2015-07-02 DIAGNOSIS — D630 Anemia in neoplastic disease: Secondary | ICD-10-CM | POA: Diagnosis not present

## 2015-07-02 DIAGNOSIS — Z79899 Other long term (current) drug therapy: Secondary | ICD-10-CM | POA: Diagnosis not present

## 2015-07-02 DIAGNOSIS — R17 Unspecified jaundice: Secondary | ICD-10-CM | POA: Diagnosis not present

## 2015-07-02 DIAGNOSIS — D631 Anemia in chronic kidney disease: Secondary | ICD-10-CM | POA: Diagnosis not present

## 2015-07-02 DIAGNOSIS — D509 Iron deficiency anemia, unspecified: Secondary | ICD-10-CM | POA: Diagnosis not present

## 2015-07-03 DIAGNOSIS — R17 Unspecified jaundice: Secondary | ICD-10-CM | POA: Diagnosis not present

## 2015-07-03 DIAGNOSIS — D631 Anemia in chronic kidney disease: Secondary | ICD-10-CM | POA: Diagnosis not present

## 2015-07-03 DIAGNOSIS — D630 Anemia in neoplastic disease: Secondary | ICD-10-CM | POA: Diagnosis not present

## 2015-07-03 DIAGNOSIS — Z79899 Other long term (current) drug therapy: Secondary | ICD-10-CM | POA: Diagnosis not present

## 2015-07-03 DIAGNOSIS — N186 End stage renal disease: Secondary | ICD-10-CM | POA: Diagnosis not present

## 2015-07-03 DIAGNOSIS — D509 Iron deficiency anemia, unspecified: Secondary | ICD-10-CM | POA: Diagnosis not present

## 2015-07-04 DIAGNOSIS — N186 End stage renal disease: Secondary | ICD-10-CM | POA: Diagnosis not present

## 2015-07-05 DIAGNOSIS — N186 End stage renal disease: Secondary | ICD-10-CM | POA: Diagnosis not present

## 2015-07-06 DIAGNOSIS — D631 Anemia in chronic kidney disease: Secondary | ICD-10-CM | POA: Diagnosis not present

## 2015-07-06 DIAGNOSIS — D630 Anemia in neoplastic disease: Secondary | ICD-10-CM | POA: Diagnosis not present

## 2015-07-06 DIAGNOSIS — D509 Iron deficiency anemia, unspecified: Secondary | ICD-10-CM | POA: Diagnosis not present

## 2015-07-06 DIAGNOSIS — Z79899 Other long term (current) drug therapy: Secondary | ICD-10-CM | POA: Diagnosis not present

## 2015-07-06 DIAGNOSIS — N186 End stage renal disease: Secondary | ICD-10-CM | POA: Diagnosis not present

## 2015-07-06 DIAGNOSIS — R17 Unspecified jaundice: Secondary | ICD-10-CM | POA: Diagnosis not present

## 2015-07-07 DIAGNOSIS — D509 Iron deficiency anemia, unspecified: Secondary | ICD-10-CM | POA: Diagnosis not present

## 2015-07-07 DIAGNOSIS — D631 Anemia in chronic kidney disease: Secondary | ICD-10-CM | POA: Diagnosis not present

## 2015-07-07 DIAGNOSIS — Z79899 Other long term (current) drug therapy: Secondary | ICD-10-CM | POA: Diagnosis not present

## 2015-07-07 DIAGNOSIS — D630 Anemia in neoplastic disease: Secondary | ICD-10-CM | POA: Diagnosis not present

## 2015-07-07 DIAGNOSIS — R17 Unspecified jaundice: Secondary | ICD-10-CM | POA: Diagnosis not present

## 2015-07-07 DIAGNOSIS — N186 End stage renal disease: Secondary | ICD-10-CM | POA: Diagnosis not present

## 2015-07-07 DIAGNOSIS — Z029 Encounter for administrative examinations, unspecified: Secondary | ICD-10-CM

## 2015-07-08 DIAGNOSIS — N186 End stage renal disease: Secondary | ICD-10-CM | POA: Diagnosis not present

## 2015-07-08 DIAGNOSIS — Z79899 Other long term (current) drug therapy: Secondary | ICD-10-CM | POA: Diagnosis not present

## 2015-07-08 DIAGNOSIS — R17 Unspecified jaundice: Secondary | ICD-10-CM | POA: Diagnosis not present

## 2015-07-08 DIAGNOSIS — D630 Anemia in neoplastic disease: Secondary | ICD-10-CM | POA: Diagnosis not present

## 2015-07-08 DIAGNOSIS — D509 Iron deficiency anemia, unspecified: Secondary | ICD-10-CM | POA: Diagnosis not present

## 2015-07-08 DIAGNOSIS — D631 Anemia in chronic kidney disease: Secondary | ICD-10-CM | POA: Diagnosis not present

## 2015-07-09 ENCOUNTER — Telehealth: Payer: Self-pay | Admitting: Family Medicine

## 2015-07-09 DIAGNOSIS — R17 Unspecified jaundice: Secondary | ICD-10-CM | POA: Diagnosis not present

## 2015-07-09 DIAGNOSIS — D630 Anemia in neoplastic disease: Secondary | ICD-10-CM | POA: Diagnosis not present

## 2015-07-09 DIAGNOSIS — Z79899 Other long term (current) drug therapy: Secondary | ICD-10-CM | POA: Diagnosis not present

## 2015-07-09 DIAGNOSIS — D631 Anemia in chronic kidney disease: Secondary | ICD-10-CM | POA: Diagnosis not present

## 2015-07-09 DIAGNOSIS — N186 End stage renal disease: Secondary | ICD-10-CM | POA: Diagnosis not present

## 2015-07-09 DIAGNOSIS — D509 Iron deficiency anemia, unspecified: Secondary | ICD-10-CM | POA: Diagnosis not present

## 2015-07-10 DIAGNOSIS — D630 Anemia in neoplastic disease: Secondary | ICD-10-CM | POA: Diagnosis not present

## 2015-07-10 DIAGNOSIS — D631 Anemia in chronic kidney disease: Secondary | ICD-10-CM | POA: Diagnosis not present

## 2015-07-10 DIAGNOSIS — N186 End stage renal disease: Secondary | ICD-10-CM | POA: Diagnosis not present

## 2015-07-10 DIAGNOSIS — Z79899 Other long term (current) drug therapy: Secondary | ICD-10-CM | POA: Diagnosis not present

## 2015-07-10 DIAGNOSIS — R17 Unspecified jaundice: Secondary | ICD-10-CM | POA: Diagnosis not present

## 2015-07-10 DIAGNOSIS — D509 Iron deficiency anemia, unspecified: Secondary | ICD-10-CM | POA: Diagnosis not present

## 2015-07-11 DIAGNOSIS — N186 End stage renal disease: Secondary | ICD-10-CM | POA: Diagnosis not present

## 2015-07-11 DIAGNOSIS — D509 Iron deficiency anemia, unspecified: Secondary | ICD-10-CM | POA: Diagnosis not present

## 2015-07-11 DIAGNOSIS — Z79899 Other long term (current) drug therapy: Secondary | ICD-10-CM | POA: Diagnosis not present

## 2015-07-11 DIAGNOSIS — D631 Anemia in chronic kidney disease: Secondary | ICD-10-CM | POA: Diagnosis not present

## 2015-07-11 DIAGNOSIS — R17 Unspecified jaundice: Secondary | ICD-10-CM | POA: Diagnosis not present

## 2015-07-11 DIAGNOSIS — D630 Anemia in neoplastic disease: Secondary | ICD-10-CM | POA: Diagnosis not present

## 2015-07-12 DIAGNOSIS — D631 Anemia in chronic kidney disease: Secondary | ICD-10-CM | POA: Diagnosis not present

## 2015-07-12 DIAGNOSIS — D509 Iron deficiency anemia, unspecified: Secondary | ICD-10-CM | POA: Diagnosis not present

## 2015-07-12 DIAGNOSIS — R17 Unspecified jaundice: Secondary | ICD-10-CM | POA: Diagnosis not present

## 2015-07-12 DIAGNOSIS — N186 End stage renal disease: Secondary | ICD-10-CM | POA: Diagnosis not present

## 2015-07-12 DIAGNOSIS — D630 Anemia in neoplastic disease: Secondary | ICD-10-CM | POA: Diagnosis not present

## 2015-07-12 DIAGNOSIS — Z79899 Other long term (current) drug therapy: Secondary | ICD-10-CM | POA: Diagnosis not present

## 2015-07-13 DIAGNOSIS — N186 End stage renal disease: Secondary | ICD-10-CM | POA: Diagnosis not present

## 2015-07-13 DIAGNOSIS — D509 Iron deficiency anemia, unspecified: Secondary | ICD-10-CM | POA: Diagnosis not present

## 2015-07-13 DIAGNOSIS — D631 Anemia in chronic kidney disease: Secondary | ICD-10-CM | POA: Diagnosis not present

## 2015-07-13 DIAGNOSIS — D630 Anemia in neoplastic disease: Secondary | ICD-10-CM | POA: Diagnosis not present

## 2015-07-13 DIAGNOSIS — R17 Unspecified jaundice: Secondary | ICD-10-CM | POA: Diagnosis not present

## 2015-07-13 DIAGNOSIS — Z79899 Other long term (current) drug therapy: Secondary | ICD-10-CM | POA: Diagnosis not present

## 2015-07-14 DIAGNOSIS — R17 Unspecified jaundice: Secondary | ICD-10-CM | POA: Diagnosis not present

## 2015-07-14 DIAGNOSIS — Z79899 Other long term (current) drug therapy: Secondary | ICD-10-CM | POA: Diagnosis not present

## 2015-07-14 DIAGNOSIS — D509 Iron deficiency anemia, unspecified: Secondary | ICD-10-CM | POA: Diagnosis not present

## 2015-07-14 DIAGNOSIS — N186 End stage renal disease: Secondary | ICD-10-CM | POA: Diagnosis not present

## 2015-07-14 DIAGNOSIS — D630 Anemia in neoplastic disease: Secondary | ICD-10-CM | POA: Diagnosis not present

## 2015-07-14 DIAGNOSIS — D631 Anemia in chronic kidney disease: Secondary | ICD-10-CM | POA: Diagnosis not present

## 2015-07-15 DIAGNOSIS — N186 End stage renal disease: Secondary | ICD-10-CM | POA: Diagnosis not present

## 2015-07-15 DIAGNOSIS — D631 Anemia in chronic kidney disease: Secondary | ICD-10-CM | POA: Diagnosis not present

## 2015-07-15 DIAGNOSIS — Z79899 Other long term (current) drug therapy: Secondary | ICD-10-CM | POA: Diagnosis not present

## 2015-07-15 DIAGNOSIS — D630 Anemia in neoplastic disease: Secondary | ICD-10-CM | POA: Diagnosis not present

## 2015-07-15 DIAGNOSIS — D509 Iron deficiency anemia, unspecified: Secondary | ICD-10-CM | POA: Diagnosis not present

## 2015-07-15 DIAGNOSIS — R17 Unspecified jaundice: Secondary | ICD-10-CM | POA: Diagnosis not present

## 2015-07-16 DIAGNOSIS — Z79899 Other long term (current) drug therapy: Secondary | ICD-10-CM | POA: Diagnosis not present

## 2015-07-16 DIAGNOSIS — D509 Iron deficiency anemia, unspecified: Secondary | ICD-10-CM | POA: Diagnosis not present

## 2015-07-16 DIAGNOSIS — D630 Anemia in neoplastic disease: Secondary | ICD-10-CM | POA: Diagnosis not present

## 2015-07-16 DIAGNOSIS — R17 Unspecified jaundice: Secondary | ICD-10-CM | POA: Diagnosis not present

## 2015-07-16 DIAGNOSIS — D631 Anemia in chronic kidney disease: Secondary | ICD-10-CM | POA: Diagnosis not present

## 2015-07-16 DIAGNOSIS — N186 End stage renal disease: Secondary | ICD-10-CM | POA: Diagnosis not present

## 2015-07-17 DIAGNOSIS — D630 Anemia in neoplastic disease: Secondary | ICD-10-CM | POA: Diagnosis not present

## 2015-07-17 DIAGNOSIS — R17 Unspecified jaundice: Secondary | ICD-10-CM | POA: Diagnosis not present

## 2015-07-17 DIAGNOSIS — D509 Iron deficiency anemia, unspecified: Secondary | ICD-10-CM | POA: Diagnosis not present

## 2015-07-17 DIAGNOSIS — N186 End stage renal disease: Secondary | ICD-10-CM | POA: Diagnosis not present

## 2015-07-17 DIAGNOSIS — Z79899 Other long term (current) drug therapy: Secondary | ICD-10-CM | POA: Diagnosis not present

## 2015-07-17 DIAGNOSIS — D631 Anemia in chronic kidney disease: Secondary | ICD-10-CM | POA: Diagnosis not present

## 2015-07-18 DIAGNOSIS — D509 Iron deficiency anemia, unspecified: Secondary | ICD-10-CM | POA: Diagnosis not present

## 2015-07-18 DIAGNOSIS — D630 Anemia in neoplastic disease: Secondary | ICD-10-CM | POA: Diagnosis not present

## 2015-07-18 DIAGNOSIS — N186 End stage renal disease: Secondary | ICD-10-CM | POA: Diagnosis not present

## 2015-07-18 DIAGNOSIS — R17 Unspecified jaundice: Secondary | ICD-10-CM | POA: Diagnosis not present

## 2015-07-18 DIAGNOSIS — Z79899 Other long term (current) drug therapy: Secondary | ICD-10-CM | POA: Diagnosis not present

## 2015-07-18 DIAGNOSIS — D631 Anemia in chronic kidney disease: Secondary | ICD-10-CM | POA: Diagnosis not present

## 2015-07-19 DIAGNOSIS — Z79899 Other long term (current) drug therapy: Secondary | ICD-10-CM | POA: Diagnosis not present

## 2015-07-19 DIAGNOSIS — D631 Anemia in chronic kidney disease: Secondary | ICD-10-CM | POA: Diagnosis not present

## 2015-07-19 DIAGNOSIS — R17 Unspecified jaundice: Secondary | ICD-10-CM | POA: Diagnosis not present

## 2015-07-19 DIAGNOSIS — D630 Anemia in neoplastic disease: Secondary | ICD-10-CM | POA: Diagnosis not present

## 2015-07-19 DIAGNOSIS — N186 End stage renal disease: Secondary | ICD-10-CM | POA: Diagnosis not present

## 2015-07-19 DIAGNOSIS — D509 Iron deficiency anemia, unspecified: Secondary | ICD-10-CM | POA: Diagnosis not present

## 2015-07-20 DIAGNOSIS — R17 Unspecified jaundice: Secondary | ICD-10-CM | POA: Diagnosis not present

## 2015-07-20 DIAGNOSIS — D631 Anemia in chronic kidney disease: Secondary | ICD-10-CM | POA: Diagnosis not present

## 2015-07-20 DIAGNOSIS — Z79899 Other long term (current) drug therapy: Secondary | ICD-10-CM | POA: Diagnosis not present

## 2015-07-20 DIAGNOSIS — D630 Anemia in neoplastic disease: Secondary | ICD-10-CM | POA: Diagnosis not present

## 2015-07-20 DIAGNOSIS — D509 Iron deficiency anemia, unspecified: Secondary | ICD-10-CM | POA: Diagnosis not present

## 2015-07-20 DIAGNOSIS — N186 End stage renal disease: Secondary | ICD-10-CM | POA: Diagnosis not present

## 2015-07-21 DIAGNOSIS — D631 Anemia in chronic kidney disease: Secondary | ICD-10-CM | POA: Diagnosis not present

## 2015-07-21 DIAGNOSIS — D509 Iron deficiency anemia, unspecified: Secondary | ICD-10-CM | POA: Diagnosis not present

## 2015-07-21 DIAGNOSIS — R17 Unspecified jaundice: Secondary | ICD-10-CM | POA: Diagnosis not present

## 2015-07-21 DIAGNOSIS — D630 Anemia in neoplastic disease: Secondary | ICD-10-CM | POA: Diagnosis not present

## 2015-07-21 DIAGNOSIS — Z79899 Other long term (current) drug therapy: Secondary | ICD-10-CM | POA: Diagnosis not present

## 2015-07-21 DIAGNOSIS — N186 End stage renal disease: Secondary | ICD-10-CM | POA: Diagnosis not present

## 2015-07-22 DIAGNOSIS — D630 Anemia in neoplastic disease: Secondary | ICD-10-CM | POA: Diagnosis not present

## 2015-07-22 DIAGNOSIS — D509 Iron deficiency anemia, unspecified: Secondary | ICD-10-CM | POA: Diagnosis not present

## 2015-07-22 DIAGNOSIS — D631 Anemia in chronic kidney disease: Secondary | ICD-10-CM | POA: Diagnosis not present

## 2015-07-22 DIAGNOSIS — R17 Unspecified jaundice: Secondary | ICD-10-CM | POA: Diagnosis not present

## 2015-07-22 DIAGNOSIS — N186 End stage renal disease: Secondary | ICD-10-CM | POA: Diagnosis not present

## 2015-07-22 DIAGNOSIS — Z79899 Other long term (current) drug therapy: Secondary | ICD-10-CM | POA: Diagnosis not present

## 2015-07-23 DIAGNOSIS — N186 End stage renal disease: Secondary | ICD-10-CM | POA: Diagnosis not present

## 2015-07-23 DIAGNOSIS — D509 Iron deficiency anemia, unspecified: Secondary | ICD-10-CM | POA: Diagnosis not present

## 2015-07-23 DIAGNOSIS — D631 Anemia in chronic kidney disease: Secondary | ICD-10-CM | POA: Diagnosis not present

## 2015-07-23 DIAGNOSIS — Z79899 Other long term (current) drug therapy: Secondary | ICD-10-CM | POA: Diagnosis not present

## 2015-07-23 DIAGNOSIS — R17 Unspecified jaundice: Secondary | ICD-10-CM | POA: Diagnosis not present

## 2015-07-23 DIAGNOSIS — D630 Anemia in neoplastic disease: Secondary | ICD-10-CM | POA: Diagnosis not present

## 2015-07-24 DIAGNOSIS — Z79899 Other long term (current) drug therapy: Secondary | ICD-10-CM | POA: Diagnosis not present

## 2015-07-24 DIAGNOSIS — N186 End stage renal disease: Secondary | ICD-10-CM | POA: Diagnosis not present

## 2015-07-24 DIAGNOSIS — D631 Anemia in chronic kidney disease: Secondary | ICD-10-CM | POA: Diagnosis not present

## 2015-07-24 DIAGNOSIS — R17 Unspecified jaundice: Secondary | ICD-10-CM | POA: Diagnosis not present

## 2015-07-24 DIAGNOSIS — D509 Iron deficiency anemia, unspecified: Secondary | ICD-10-CM | POA: Diagnosis not present

## 2015-07-24 DIAGNOSIS — D630 Anemia in neoplastic disease: Secondary | ICD-10-CM | POA: Diagnosis not present

## 2015-07-25 DIAGNOSIS — R17 Unspecified jaundice: Secondary | ICD-10-CM | POA: Diagnosis not present

## 2015-07-25 DIAGNOSIS — N186 End stage renal disease: Secondary | ICD-10-CM | POA: Diagnosis not present

## 2015-07-25 DIAGNOSIS — D631 Anemia in chronic kidney disease: Secondary | ICD-10-CM | POA: Diagnosis not present

## 2015-07-25 DIAGNOSIS — D630 Anemia in neoplastic disease: Secondary | ICD-10-CM | POA: Diagnosis not present

## 2015-07-25 DIAGNOSIS — Z79899 Other long term (current) drug therapy: Secondary | ICD-10-CM | POA: Diagnosis not present

## 2015-07-25 DIAGNOSIS — D509 Iron deficiency anemia, unspecified: Secondary | ICD-10-CM | POA: Diagnosis not present

## 2015-07-26 DIAGNOSIS — D509 Iron deficiency anemia, unspecified: Secondary | ICD-10-CM | POA: Diagnosis not present

## 2015-07-26 DIAGNOSIS — R17 Unspecified jaundice: Secondary | ICD-10-CM | POA: Diagnosis not present

## 2015-07-26 DIAGNOSIS — Z79899 Other long term (current) drug therapy: Secondary | ICD-10-CM | POA: Diagnosis not present

## 2015-07-26 DIAGNOSIS — D630 Anemia in neoplastic disease: Secondary | ICD-10-CM | POA: Diagnosis not present

## 2015-07-26 DIAGNOSIS — D631 Anemia in chronic kidney disease: Secondary | ICD-10-CM | POA: Diagnosis not present

## 2015-07-26 DIAGNOSIS — N186 End stage renal disease: Secondary | ICD-10-CM | POA: Diagnosis not present

## 2015-07-27 DIAGNOSIS — R17 Unspecified jaundice: Secondary | ICD-10-CM | POA: Diagnosis not present

## 2015-07-27 DIAGNOSIS — D630 Anemia in neoplastic disease: Secondary | ICD-10-CM | POA: Diagnosis not present

## 2015-07-27 DIAGNOSIS — Z79899 Other long term (current) drug therapy: Secondary | ICD-10-CM | POA: Diagnosis not present

## 2015-07-27 DIAGNOSIS — N186 End stage renal disease: Secondary | ICD-10-CM | POA: Diagnosis not present

## 2015-07-27 DIAGNOSIS — D509 Iron deficiency anemia, unspecified: Secondary | ICD-10-CM | POA: Diagnosis not present

## 2015-07-27 DIAGNOSIS — D631 Anemia in chronic kidney disease: Secondary | ICD-10-CM | POA: Diagnosis not present

## 2015-07-28 DIAGNOSIS — D630 Anemia in neoplastic disease: Secondary | ICD-10-CM | POA: Diagnosis not present

## 2015-07-28 DIAGNOSIS — D631 Anemia in chronic kidney disease: Secondary | ICD-10-CM | POA: Diagnosis not present

## 2015-07-28 DIAGNOSIS — N186 End stage renal disease: Secondary | ICD-10-CM | POA: Diagnosis not present

## 2015-07-28 DIAGNOSIS — Z79899 Other long term (current) drug therapy: Secondary | ICD-10-CM | POA: Diagnosis not present

## 2015-07-28 DIAGNOSIS — R17 Unspecified jaundice: Secondary | ICD-10-CM | POA: Diagnosis not present

## 2015-07-28 DIAGNOSIS — D509 Iron deficiency anemia, unspecified: Secondary | ICD-10-CM | POA: Diagnosis not present

## 2015-07-29 DIAGNOSIS — N186 End stage renal disease: Secondary | ICD-10-CM | POA: Diagnosis not present

## 2015-07-29 DIAGNOSIS — D630 Anemia in neoplastic disease: Secondary | ICD-10-CM | POA: Diagnosis not present

## 2015-07-29 DIAGNOSIS — R17 Unspecified jaundice: Secondary | ICD-10-CM | POA: Diagnosis not present

## 2015-07-29 DIAGNOSIS — D509 Iron deficiency anemia, unspecified: Secondary | ICD-10-CM | POA: Diagnosis not present

## 2015-07-29 DIAGNOSIS — Z79899 Other long term (current) drug therapy: Secondary | ICD-10-CM | POA: Diagnosis not present

## 2015-07-29 DIAGNOSIS — Z992 Dependence on renal dialysis: Secondary | ICD-10-CM | POA: Diagnosis not present

## 2015-07-29 DIAGNOSIS — I7789 Other specified disorders of arteries and arterioles: Secondary | ICD-10-CM | POA: Diagnosis not present

## 2015-07-29 DIAGNOSIS — D631 Anemia in chronic kidney disease: Secondary | ICD-10-CM | POA: Diagnosis not present

## 2015-07-30 DIAGNOSIS — N186 End stage renal disease: Secondary | ICD-10-CM | POA: Diagnosis not present

## 2015-07-30 DIAGNOSIS — D631 Anemia in chronic kidney disease: Secondary | ICD-10-CM | POA: Diagnosis not present

## 2015-07-30 DIAGNOSIS — Z4932 Encounter for adequacy testing for peritoneal dialysis: Secondary | ICD-10-CM | POA: Diagnosis not present

## 2015-07-30 DIAGNOSIS — N2581 Secondary hyperparathyroidism of renal origin: Secondary | ICD-10-CM | POA: Diagnosis not present

## 2015-07-30 DIAGNOSIS — K769 Liver disease, unspecified: Secondary | ICD-10-CM | POA: Diagnosis not present

## 2015-07-30 DIAGNOSIS — N2589 Other disorders resulting from impaired renal tubular function: Secondary | ICD-10-CM | POA: Diagnosis not present

## 2015-07-30 DIAGNOSIS — D509 Iron deficiency anemia, unspecified: Secondary | ICD-10-CM | POA: Diagnosis not present

## 2015-07-31 DIAGNOSIS — D631 Anemia in chronic kidney disease: Secondary | ICD-10-CM | POA: Diagnosis not present

## 2015-07-31 DIAGNOSIS — K769 Liver disease, unspecified: Secondary | ICD-10-CM | POA: Diagnosis not present

## 2015-07-31 DIAGNOSIS — N186 End stage renal disease: Secondary | ICD-10-CM | POA: Diagnosis not present

## 2015-07-31 DIAGNOSIS — N2581 Secondary hyperparathyroidism of renal origin: Secondary | ICD-10-CM | POA: Diagnosis not present

## 2015-07-31 DIAGNOSIS — D509 Iron deficiency anemia, unspecified: Secondary | ICD-10-CM | POA: Diagnosis not present

## 2015-07-31 DIAGNOSIS — Z4932 Encounter for adequacy testing for peritoneal dialysis: Secondary | ICD-10-CM | POA: Diagnosis not present

## 2015-08-01 DIAGNOSIS — N2581 Secondary hyperparathyroidism of renal origin: Secondary | ICD-10-CM | POA: Diagnosis not present

## 2015-08-01 DIAGNOSIS — D631 Anemia in chronic kidney disease: Secondary | ICD-10-CM | POA: Diagnosis not present

## 2015-08-01 DIAGNOSIS — K769 Liver disease, unspecified: Secondary | ICD-10-CM | POA: Diagnosis not present

## 2015-08-01 DIAGNOSIS — Z4932 Encounter for adequacy testing for peritoneal dialysis: Secondary | ICD-10-CM | POA: Diagnosis not present

## 2015-08-01 DIAGNOSIS — D509 Iron deficiency anemia, unspecified: Secondary | ICD-10-CM | POA: Diagnosis not present

## 2015-08-01 DIAGNOSIS — N186 End stage renal disease: Secondary | ICD-10-CM | POA: Diagnosis not present

## 2015-08-02 DIAGNOSIS — N2581 Secondary hyperparathyroidism of renal origin: Secondary | ICD-10-CM | POA: Diagnosis not present

## 2015-08-02 DIAGNOSIS — K769 Liver disease, unspecified: Secondary | ICD-10-CM | POA: Diagnosis not present

## 2015-08-02 DIAGNOSIS — N186 End stage renal disease: Secondary | ICD-10-CM | POA: Diagnosis not present

## 2015-08-02 DIAGNOSIS — D631 Anemia in chronic kidney disease: Secondary | ICD-10-CM | POA: Diagnosis not present

## 2015-08-02 DIAGNOSIS — Z4932 Encounter for adequacy testing for peritoneal dialysis: Secondary | ICD-10-CM | POA: Diagnosis not present

## 2015-08-02 DIAGNOSIS — D509 Iron deficiency anemia, unspecified: Secondary | ICD-10-CM | POA: Diagnosis not present

## 2015-08-03 ENCOUNTER — Ambulatory Visit (INDEPENDENT_AMBULATORY_CARE_PROVIDER_SITE_OTHER): Payer: Medicare Other | Admitting: Physician Assistant

## 2015-08-03 ENCOUNTER — Encounter: Payer: Self-pay | Admitting: Physician Assistant

## 2015-08-03 VITALS — BP 127/70 | HR 92 | Temp 97.6°F | Ht 63.0 in | Wt 141.6 lb

## 2015-08-03 DIAGNOSIS — K769 Liver disease, unspecified: Secondary | ICD-10-CM | POA: Diagnosis not present

## 2015-08-03 DIAGNOSIS — H6123 Impacted cerumen, bilateral: Secondary | ICD-10-CM | POA: Diagnosis not present

## 2015-08-03 DIAGNOSIS — Z4932 Encounter for adequacy testing for peritoneal dialysis: Secondary | ICD-10-CM | POA: Diagnosis not present

## 2015-08-03 DIAGNOSIS — N2581 Secondary hyperparathyroidism of renal origin: Secondary | ICD-10-CM | POA: Diagnosis not present

## 2015-08-03 DIAGNOSIS — N186 End stage renal disease: Secondary | ICD-10-CM | POA: Diagnosis not present

## 2015-08-03 DIAGNOSIS — D631 Anemia in chronic kidney disease: Secondary | ICD-10-CM | POA: Diagnosis not present

## 2015-08-03 DIAGNOSIS — D509 Iron deficiency anemia, unspecified: Secondary | ICD-10-CM | POA: Diagnosis not present

## 2015-08-03 NOTE — Progress Notes (Signed)
Subjective:     Patient ID: Darren Dunn, male   DOB: 1945/06/03, 70 y.o.   MRN: [id number]  HPI Pt with bilateral ear fullness and decrease in hearing He wears bilateral hearing aides  Review of Systems  Constitutional: Negative.   HENT: Negative for congestion, ear discharge, ear pain, postnasal drip, rhinorrhea and sinus pressure.        Objective:   Physical Exam  Nursing note and vitals reviewed. Bilat hearing aides in place Ears canals impacted with cerumen bilat Both ear irrigated with removal of cerumen plugs Following removal canals and TM's nl     Assessment:     1. Cerumen impaction, bilateral        Plan:     OTC wax softener monthly F/U prn

## 2015-08-03 NOTE — Patient Instructions (Signed)
Cerumen Impaction The structures of the external ear canal secrete a waxy substance known as cerumen. Excess cerumen can build up in the ear canal, causing a condition known as cerumen impaction. Cerumen impaction can cause ear pain and disrupt the function of the ear. The rate of cerumen production differs for each individual. In certain individuals, the configuration of the ear canal may decrease his or her ability to naturally remove cerumen. CAUSES Cerumen impaction is caused by excessive cerumen production or buildup. RISK FACTORS  Frequent use of swabs to clean ears.  Having narrow ear canals.  Having eczema.  Being dehydrated. SIGNS AND SYMPTOMS  Diminished hearing.  Ear drainage.  Ear pain.  Ear itch. TREATMENT Treatment may involve:  Over-the-counter or prescription ear drops to soften the cerumen.  Removal of cerumen by a health care provider. This may be done with:  Irrigation with warm water. This is the most common method of removal.  Ear curettes and other instruments.  Surgery. This may be done in severe cases. HOME CARE INSTRUCTIONS  Take medicines only as directed by your health care provider.  Do not insert objects into the ear with the intent of cleaning the ear. PREVENTION  Do not insert objects into the ear, even with the intent of cleaning the ear. Removing cerumen as a part of normal hygiene is not necessary, and the use of swabs in the ear canal is not recommended.  Drink enough water to keep your urine clear or pale yellow.  Control your eczema if you have it. SEEK MEDICAL CARE IF:  You develop ear pain.  You develop bleeding from the ear.  The cerumen does not clear after you use ear drops as directed.   This information is not intended to replace advice given to you by your health care provider. Make sure you discuss any questions you have with your health care provider.   Document Released: 02/23/2004 Document Revised: 02/05/2014  Document Reviewed: 09/01/2014 Elsevier Interactive Patient Education [employer].

## 2015-08-04 DIAGNOSIS — E784 Other hyperlipidemia: Secondary | ICD-10-CM | POA: Diagnosis not present

## 2015-08-04 DIAGNOSIS — D631 Anemia in chronic kidney disease: Secondary | ICD-10-CM | POA: Diagnosis not present

## 2015-08-04 DIAGNOSIS — K769 Liver disease, unspecified: Secondary | ICD-10-CM | POA: Diagnosis not present

## 2015-08-04 DIAGNOSIS — N2581 Secondary hyperparathyroidism of renal origin: Secondary | ICD-10-CM | POA: Diagnosis not present

## 2015-08-04 DIAGNOSIS — N186 End stage renal disease: Secondary | ICD-10-CM | POA: Diagnosis not present

## 2015-08-04 DIAGNOSIS — R8299 Other abnormal findings in urine: Secondary | ICD-10-CM | POA: Diagnosis not present

## 2015-08-04 DIAGNOSIS — Z4932 Encounter for adequacy testing for peritoneal dialysis: Secondary | ICD-10-CM | POA: Diagnosis not present

## 2015-08-04 DIAGNOSIS — D509 Iron deficiency anemia, unspecified: Secondary | ICD-10-CM | POA: Diagnosis not present

## 2015-08-05 DIAGNOSIS — D631 Anemia in chronic kidney disease: Secondary | ICD-10-CM | POA: Diagnosis not present

## 2015-08-05 DIAGNOSIS — N186 End stage renal disease: Secondary | ICD-10-CM | POA: Diagnosis not present

## 2015-08-05 DIAGNOSIS — K769 Liver disease, unspecified: Secondary | ICD-10-CM | POA: Diagnosis not present

## 2015-08-05 DIAGNOSIS — N2581 Secondary hyperparathyroidism of renal origin: Secondary | ICD-10-CM | POA: Diagnosis not present

## 2015-08-05 DIAGNOSIS — Z4932 Encounter for adequacy testing for peritoneal dialysis: Secondary | ICD-10-CM | POA: Diagnosis not present

## 2015-08-05 DIAGNOSIS — D509 Iron deficiency anemia, unspecified: Secondary | ICD-10-CM | POA: Diagnosis not present

## 2015-08-06 DIAGNOSIS — D631 Anemia in chronic kidney disease: Secondary | ICD-10-CM | POA: Diagnosis not present

## 2015-08-06 DIAGNOSIS — K769 Liver disease, unspecified: Secondary | ICD-10-CM | POA: Diagnosis not present

## 2015-08-06 DIAGNOSIS — D509 Iron deficiency anemia, unspecified: Secondary | ICD-10-CM | POA: Diagnosis not present

## 2015-08-06 DIAGNOSIS — N2581 Secondary hyperparathyroidism of renal origin: Secondary | ICD-10-CM | POA: Diagnosis not present

## 2015-08-06 DIAGNOSIS — N186 End stage renal disease: Secondary | ICD-10-CM | POA: Diagnosis not present

## 2015-08-06 DIAGNOSIS — Z4932 Encounter for adequacy testing for peritoneal dialysis: Secondary | ICD-10-CM | POA: Diagnosis not present

## 2015-08-07 DIAGNOSIS — K769 Liver disease, unspecified: Secondary | ICD-10-CM | POA: Diagnosis not present

## 2015-08-07 DIAGNOSIS — D631 Anemia in chronic kidney disease: Secondary | ICD-10-CM | POA: Diagnosis not present

## 2015-08-07 DIAGNOSIS — N186 End stage renal disease: Secondary | ICD-10-CM | POA: Diagnosis not present

## 2015-08-07 DIAGNOSIS — D509 Iron deficiency anemia, unspecified: Secondary | ICD-10-CM | POA: Diagnosis not present

## 2015-08-07 DIAGNOSIS — Z4932 Encounter for adequacy testing for peritoneal dialysis: Secondary | ICD-10-CM | POA: Diagnosis not present

## 2015-08-07 DIAGNOSIS — N2581 Secondary hyperparathyroidism of renal origin: Secondary | ICD-10-CM | POA: Diagnosis not present

## 2015-08-08 DIAGNOSIS — Z4932 Encounter for adequacy testing for peritoneal dialysis: Secondary | ICD-10-CM | POA: Diagnosis not present

## 2015-08-08 DIAGNOSIS — N2581 Secondary hyperparathyroidism of renal origin: Secondary | ICD-10-CM | POA: Diagnosis not present

## 2015-08-08 DIAGNOSIS — K769 Liver disease, unspecified: Secondary | ICD-10-CM | POA: Diagnosis not present

## 2015-08-08 DIAGNOSIS — D631 Anemia in chronic kidney disease: Secondary | ICD-10-CM | POA: Diagnosis not present

## 2015-08-08 DIAGNOSIS — N186 End stage renal disease: Secondary | ICD-10-CM | POA: Diagnosis not present

## 2015-08-08 DIAGNOSIS — D509 Iron deficiency anemia, unspecified: Secondary | ICD-10-CM | POA: Diagnosis not present

## 2015-08-09 DIAGNOSIS — D631 Anemia in chronic kidney disease: Secondary | ICD-10-CM | POA: Diagnosis not present

## 2015-08-09 DIAGNOSIS — K769 Liver disease, unspecified: Secondary | ICD-10-CM | POA: Diagnosis not present

## 2015-08-09 DIAGNOSIS — Z4932 Encounter for adequacy testing for peritoneal dialysis: Secondary | ICD-10-CM | POA: Diagnosis not present

## 2015-08-09 DIAGNOSIS — N186 End stage renal disease: Secondary | ICD-10-CM | POA: Diagnosis not present

## 2015-08-09 DIAGNOSIS — N2581 Secondary hyperparathyroidism of renal origin: Secondary | ICD-10-CM | POA: Diagnosis not present

## 2015-08-09 DIAGNOSIS — D509 Iron deficiency anemia, unspecified: Secondary | ICD-10-CM | POA: Diagnosis not present

## 2015-08-10 DIAGNOSIS — Z4932 Encounter for adequacy testing for peritoneal dialysis: Secondary | ICD-10-CM | POA: Diagnosis not present

## 2015-08-10 DIAGNOSIS — N186 End stage renal disease: Secondary | ICD-10-CM | POA: Diagnosis not present

## 2015-08-10 DIAGNOSIS — D509 Iron deficiency anemia, unspecified: Secondary | ICD-10-CM | POA: Diagnosis not present

## 2015-08-10 DIAGNOSIS — N2581 Secondary hyperparathyroidism of renal origin: Secondary | ICD-10-CM | POA: Diagnosis not present

## 2015-08-10 DIAGNOSIS — K769 Liver disease, unspecified: Secondary | ICD-10-CM | POA: Diagnosis not present

## 2015-08-10 DIAGNOSIS — D631 Anemia in chronic kidney disease: Secondary | ICD-10-CM | POA: Diagnosis not present

## 2015-08-11 DIAGNOSIS — Z4932 Encounter for adequacy testing for peritoneal dialysis: Secondary | ICD-10-CM | POA: Diagnosis not present

## 2015-08-11 DIAGNOSIS — K769 Liver disease, unspecified: Secondary | ICD-10-CM | POA: Diagnosis not present

## 2015-08-11 DIAGNOSIS — D631 Anemia in chronic kidney disease: Secondary | ICD-10-CM | POA: Diagnosis not present

## 2015-08-11 DIAGNOSIS — D509 Iron deficiency anemia, unspecified: Secondary | ICD-10-CM | POA: Diagnosis not present

## 2015-08-11 DIAGNOSIS — N2581 Secondary hyperparathyroidism of renal origin: Secondary | ICD-10-CM | POA: Diagnosis not present

## 2015-08-11 DIAGNOSIS — N186 End stage renal disease: Secondary | ICD-10-CM | POA: Diagnosis not present

## 2015-08-12 DIAGNOSIS — D631 Anemia in chronic kidney disease: Secondary | ICD-10-CM | POA: Diagnosis not present

## 2015-08-12 DIAGNOSIS — N186 End stage renal disease: Secondary | ICD-10-CM | POA: Diagnosis not present

## 2015-08-12 DIAGNOSIS — D509 Iron deficiency anemia, unspecified: Secondary | ICD-10-CM | POA: Diagnosis not present

## 2015-08-12 DIAGNOSIS — Z4932 Encounter for adequacy testing for peritoneal dialysis: Secondary | ICD-10-CM | POA: Diagnosis not present

## 2015-08-12 DIAGNOSIS — K769 Liver disease, unspecified: Secondary | ICD-10-CM | POA: Diagnosis not present

## 2015-08-12 DIAGNOSIS — N2581 Secondary hyperparathyroidism of renal origin: Secondary | ICD-10-CM | POA: Diagnosis not present

## 2015-08-13 DIAGNOSIS — N186 End stage renal disease: Secondary | ICD-10-CM | POA: Diagnosis not present

## 2015-08-13 DIAGNOSIS — Z4932 Encounter for adequacy testing for peritoneal dialysis: Secondary | ICD-10-CM | POA: Diagnosis not present

## 2015-08-13 DIAGNOSIS — D631 Anemia in chronic kidney disease: Secondary | ICD-10-CM | POA: Diagnosis not present

## 2015-08-13 DIAGNOSIS — D509 Iron deficiency anemia, unspecified: Secondary | ICD-10-CM | POA: Diagnosis not present

## 2015-08-13 DIAGNOSIS — N2581 Secondary hyperparathyroidism of renal origin: Secondary | ICD-10-CM | POA: Diagnosis not present

## 2015-08-13 DIAGNOSIS — K769 Liver disease, unspecified: Secondary | ICD-10-CM | POA: Diagnosis not present

## 2015-08-14 DIAGNOSIS — D631 Anemia in chronic kidney disease: Secondary | ICD-10-CM | POA: Diagnosis not present

## 2015-08-14 DIAGNOSIS — N2581 Secondary hyperparathyroidism of renal origin: Secondary | ICD-10-CM | POA: Diagnosis not present

## 2015-08-14 DIAGNOSIS — K769 Liver disease, unspecified: Secondary | ICD-10-CM | POA: Diagnosis not present

## 2015-08-14 DIAGNOSIS — Z4932 Encounter for adequacy testing for peritoneal dialysis: Secondary | ICD-10-CM | POA: Diagnosis not present

## 2015-08-14 DIAGNOSIS — N186 End stage renal disease: Secondary | ICD-10-CM | POA: Diagnosis not present

## 2015-08-14 DIAGNOSIS — D509 Iron deficiency anemia, unspecified: Secondary | ICD-10-CM | POA: Diagnosis not present

## 2015-08-15 DIAGNOSIS — K769 Liver disease, unspecified: Secondary | ICD-10-CM | POA: Diagnosis not present

## 2015-08-15 DIAGNOSIS — D509 Iron deficiency anemia, unspecified: Secondary | ICD-10-CM | POA: Diagnosis not present

## 2015-08-15 DIAGNOSIS — Z4932 Encounter for adequacy testing for peritoneal dialysis: Secondary | ICD-10-CM | POA: Diagnosis not present

## 2015-08-15 DIAGNOSIS — N186 End stage renal disease: Secondary | ICD-10-CM | POA: Diagnosis not present

## 2015-08-15 DIAGNOSIS — D631 Anemia in chronic kidney disease: Secondary | ICD-10-CM | POA: Diagnosis not present

## 2015-08-15 DIAGNOSIS — N2581 Secondary hyperparathyroidism of renal origin: Secondary | ICD-10-CM | POA: Diagnosis not present

## 2015-08-16 DIAGNOSIS — D631 Anemia in chronic kidney disease: Secondary | ICD-10-CM | POA: Diagnosis not present

## 2015-08-16 DIAGNOSIS — K769 Liver disease, unspecified: Secondary | ICD-10-CM | POA: Diagnosis not present

## 2015-08-16 DIAGNOSIS — D509 Iron deficiency anemia, unspecified: Secondary | ICD-10-CM | POA: Diagnosis not present

## 2015-08-16 DIAGNOSIS — Z4932 Encounter for adequacy testing for peritoneal dialysis: Secondary | ICD-10-CM | POA: Diagnosis not present

## 2015-08-16 DIAGNOSIS — N2581 Secondary hyperparathyroidism of renal origin: Secondary | ICD-10-CM | POA: Diagnosis not present

## 2015-08-16 DIAGNOSIS — N186 End stage renal disease: Secondary | ICD-10-CM | POA: Diagnosis not present

## 2015-08-17 DIAGNOSIS — D631 Anemia in chronic kidney disease: Secondary | ICD-10-CM | POA: Diagnosis not present

## 2015-08-17 DIAGNOSIS — N2581 Secondary hyperparathyroidism of renal origin: Secondary | ICD-10-CM | POA: Diagnosis not present

## 2015-08-17 DIAGNOSIS — D509 Iron deficiency anemia, unspecified: Secondary | ICD-10-CM | POA: Diagnosis not present

## 2015-08-17 DIAGNOSIS — Z4932 Encounter for adequacy testing for peritoneal dialysis: Secondary | ICD-10-CM | POA: Diagnosis not present

## 2015-08-17 DIAGNOSIS — N186 End stage renal disease: Secondary | ICD-10-CM | POA: Diagnosis not present

## 2015-08-17 DIAGNOSIS — K769 Liver disease, unspecified: Secondary | ICD-10-CM | POA: Diagnosis not present

## 2015-08-18 ENCOUNTER — Other Ambulatory Visit: Payer: Self-pay | Admitting: Family Medicine

## 2015-08-18 DIAGNOSIS — D631 Anemia in chronic kidney disease: Secondary | ICD-10-CM | POA: Diagnosis not present

## 2015-08-18 DIAGNOSIS — Z4932 Encounter for adequacy testing for peritoneal dialysis: Secondary | ICD-10-CM | POA: Diagnosis not present

## 2015-08-18 DIAGNOSIS — N186 End stage renal disease: Secondary | ICD-10-CM | POA: Diagnosis not present

## 2015-08-18 DIAGNOSIS — N2581 Secondary hyperparathyroidism of renal origin: Secondary | ICD-10-CM | POA: Diagnosis not present

## 2015-08-18 DIAGNOSIS — D509 Iron deficiency anemia, unspecified: Secondary | ICD-10-CM | POA: Diagnosis not present

## 2015-08-18 DIAGNOSIS — K769 Liver disease, unspecified: Secondary | ICD-10-CM | POA: Diagnosis not present

## 2015-08-19 DIAGNOSIS — D631 Anemia in chronic kidney disease: Secondary | ICD-10-CM | POA: Diagnosis not present

## 2015-08-19 DIAGNOSIS — Z4932 Encounter for adequacy testing for peritoneal dialysis: Secondary | ICD-10-CM | POA: Diagnosis not present

## 2015-08-19 DIAGNOSIS — D509 Iron deficiency anemia, unspecified: Secondary | ICD-10-CM | POA: Diagnosis not present

## 2015-08-19 DIAGNOSIS — K769 Liver disease, unspecified: Secondary | ICD-10-CM | POA: Diagnosis not present

## 2015-08-19 DIAGNOSIS — N2581 Secondary hyperparathyroidism of renal origin: Secondary | ICD-10-CM | POA: Diagnosis not present

## 2015-08-19 DIAGNOSIS — N186 End stage renal disease: Secondary | ICD-10-CM | POA: Diagnosis not present

## 2015-08-19 NOTE — Telephone Encounter (Signed)
NTBS for labs - no thyroid panel on file Stacks pt = last seen 4/17 ([facility] seen 7/17 for acute care only)

## 2015-08-20 DIAGNOSIS — D509 Iron deficiency anemia, unspecified: Secondary | ICD-10-CM | POA: Diagnosis not present

## 2015-08-20 DIAGNOSIS — Z4932 Encounter for adequacy testing for peritoneal dialysis: Secondary | ICD-10-CM | POA: Diagnosis not present

## 2015-08-20 DIAGNOSIS — D631 Anemia in chronic kidney disease: Secondary | ICD-10-CM | POA: Diagnosis not present

## 2015-08-20 DIAGNOSIS — N2581 Secondary hyperparathyroidism of renal origin: Secondary | ICD-10-CM | POA: Diagnosis not present

## 2015-08-20 DIAGNOSIS — N186 End stage renal disease: Secondary | ICD-10-CM | POA: Diagnosis not present

## 2015-08-20 DIAGNOSIS — K769 Liver disease, unspecified: Secondary | ICD-10-CM | POA: Diagnosis not present

## 2015-08-21 DIAGNOSIS — D631 Anemia in chronic kidney disease: Secondary | ICD-10-CM | POA: Diagnosis not present

## 2015-08-21 DIAGNOSIS — D509 Iron deficiency anemia, unspecified: Secondary | ICD-10-CM | POA: Diagnosis not present

## 2015-08-21 DIAGNOSIS — K769 Liver disease, unspecified: Secondary | ICD-10-CM | POA: Diagnosis not present

## 2015-08-21 DIAGNOSIS — N186 End stage renal disease: Secondary | ICD-10-CM | POA: Diagnosis not present

## 2015-08-21 DIAGNOSIS — Z4932 Encounter for adequacy testing for peritoneal dialysis: Secondary | ICD-10-CM | POA: Diagnosis not present

## 2015-08-21 DIAGNOSIS — N2581 Secondary hyperparathyroidism of renal origin: Secondary | ICD-10-CM | POA: Diagnosis not present

## 2015-08-22 DIAGNOSIS — D631 Anemia in chronic kidney disease: Secondary | ICD-10-CM | POA: Diagnosis not present

## 2015-08-22 DIAGNOSIS — N186 End stage renal disease: Secondary | ICD-10-CM | POA: Diagnosis not present

## 2015-08-22 DIAGNOSIS — K769 Liver disease, unspecified: Secondary | ICD-10-CM | POA: Diagnosis not present

## 2015-08-22 DIAGNOSIS — Z4932 Encounter for adequacy testing for peritoneal dialysis: Secondary | ICD-10-CM | POA: Diagnosis not present

## 2015-08-22 DIAGNOSIS — D509 Iron deficiency anemia, unspecified: Secondary | ICD-10-CM | POA: Diagnosis not present

## 2015-08-22 DIAGNOSIS — N2581 Secondary hyperparathyroidism of renal origin: Secondary | ICD-10-CM | POA: Diagnosis not present

## 2015-08-23 DIAGNOSIS — K769 Liver disease, unspecified: Secondary | ICD-10-CM | POA: Diagnosis not present

## 2015-08-23 DIAGNOSIS — N186 End stage renal disease: Secondary | ICD-10-CM | POA: Diagnosis not present

## 2015-08-23 DIAGNOSIS — D631 Anemia in chronic kidney disease: Secondary | ICD-10-CM | POA: Diagnosis not present

## 2015-08-23 DIAGNOSIS — Z4932 Encounter for adequacy testing for peritoneal dialysis: Secondary | ICD-10-CM | POA: Diagnosis not present

## 2015-08-23 DIAGNOSIS — N2581 Secondary hyperparathyroidism of renal origin: Secondary | ICD-10-CM | POA: Diagnosis not present

## 2015-08-23 DIAGNOSIS — D509 Iron deficiency anemia, unspecified: Secondary | ICD-10-CM | POA: Diagnosis not present

## 2015-08-24 DIAGNOSIS — Z4932 Encounter for adequacy testing for peritoneal dialysis: Secondary | ICD-10-CM | POA: Diagnosis not present

## 2015-08-24 DIAGNOSIS — N2581 Secondary hyperparathyroidism of renal origin: Secondary | ICD-10-CM | POA: Diagnosis not present

## 2015-08-24 DIAGNOSIS — D509 Iron deficiency anemia, unspecified: Secondary | ICD-10-CM | POA: Diagnosis not present

## 2015-08-24 DIAGNOSIS — N186 End stage renal disease: Secondary | ICD-10-CM | POA: Diagnosis not present

## 2015-08-24 DIAGNOSIS — K769 Liver disease, unspecified: Secondary | ICD-10-CM | POA: Diagnosis not present

## 2015-08-24 DIAGNOSIS — D631 Anemia in chronic kidney disease: Secondary | ICD-10-CM | POA: Diagnosis not present

## 2015-08-25 DIAGNOSIS — K769 Liver disease, unspecified: Secondary | ICD-10-CM | POA: Diagnosis not present

## 2015-08-25 DIAGNOSIS — N2581 Secondary hyperparathyroidism of renal origin: Secondary | ICD-10-CM | POA: Diagnosis not present

## 2015-08-25 DIAGNOSIS — D631 Anemia in chronic kidney disease: Secondary | ICD-10-CM | POA: Diagnosis not present

## 2015-08-25 DIAGNOSIS — Z4932 Encounter for adequacy testing for peritoneal dialysis: Secondary | ICD-10-CM | POA: Diagnosis not present

## 2015-08-25 DIAGNOSIS — N186 End stage renal disease: Secondary | ICD-10-CM | POA: Diagnosis not present

## 2015-08-25 DIAGNOSIS — D509 Iron deficiency anemia, unspecified: Secondary | ICD-10-CM | POA: Diagnosis not present

## 2015-08-26 DIAGNOSIS — D631 Anemia in chronic kidney disease: Secondary | ICD-10-CM | POA: Diagnosis not present

## 2015-08-26 DIAGNOSIS — D509 Iron deficiency anemia, unspecified: Secondary | ICD-10-CM | POA: Diagnosis not present

## 2015-08-26 DIAGNOSIS — N186 End stage renal disease: Secondary | ICD-10-CM | POA: Diagnosis not present

## 2015-08-26 DIAGNOSIS — K769 Liver disease, unspecified: Secondary | ICD-10-CM | POA: Diagnosis not present

## 2015-08-26 DIAGNOSIS — N2581 Secondary hyperparathyroidism of renal origin: Secondary | ICD-10-CM | POA: Diagnosis not present

## 2015-08-26 DIAGNOSIS — Z4932 Encounter for adequacy testing for peritoneal dialysis: Secondary | ICD-10-CM | POA: Diagnosis not present

## 2015-08-27 DIAGNOSIS — N186 End stage renal disease: Secondary | ICD-10-CM | POA: Diagnosis not present

## 2015-08-27 DIAGNOSIS — D509 Iron deficiency anemia, unspecified: Secondary | ICD-10-CM | POA: Diagnosis not present

## 2015-08-27 DIAGNOSIS — K769 Liver disease, unspecified: Secondary | ICD-10-CM | POA: Diagnosis not present

## 2015-08-27 DIAGNOSIS — N2581 Secondary hyperparathyroidism of renal origin: Secondary | ICD-10-CM | POA: Diagnosis not present

## 2015-08-27 DIAGNOSIS — D631 Anemia in chronic kidney disease: Secondary | ICD-10-CM | POA: Diagnosis not present

## 2015-08-27 DIAGNOSIS — Z4932 Encounter for adequacy testing for peritoneal dialysis: Secondary | ICD-10-CM | POA: Diagnosis not present

## 2015-08-28 DIAGNOSIS — N186 End stage renal disease: Secondary | ICD-10-CM | POA: Diagnosis not present

## 2015-08-28 DIAGNOSIS — Z4932 Encounter for adequacy testing for peritoneal dialysis: Secondary | ICD-10-CM | POA: Diagnosis not present

## 2015-08-28 DIAGNOSIS — N2581 Secondary hyperparathyroidism of renal origin: Secondary | ICD-10-CM | POA: Diagnosis not present

## 2015-08-28 DIAGNOSIS — D509 Iron deficiency anemia, unspecified: Secondary | ICD-10-CM | POA: Diagnosis not present

## 2015-08-28 DIAGNOSIS — K769 Liver disease, unspecified: Secondary | ICD-10-CM | POA: Diagnosis not present

## 2015-08-28 DIAGNOSIS — D631 Anemia in chronic kidney disease: Secondary | ICD-10-CM | POA: Diagnosis not present

## 2015-08-29 DIAGNOSIS — D631 Anemia in chronic kidney disease: Secondary | ICD-10-CM | POA: Diagnosis not present

## 2015-08-29 DIAGNOSIS — Z4932 Encounter for adequacy testing for peritoneal dialysis: Secondary | ICD-10-CM | POA: Diagnosis not present

## 2015-08-29 DIAGNOSIS — D509 Iron deficiency anemia, unspecified: Secondary | ICD-10-CM | POA: Diagnosis not present

## 2015-08-29 DIAGNOSIS — N186 End stage renal disease: Secondary | ICD-10-CM | POA: Diagnosis not present

## 2015-08-29 DIAGNOSIS — Z992 Dependence on renal dialysis: Secondary | ICD-10-CM | POA: Diagnosis not present

## 2015-08-29 DIAGNOSIS — K769 Liver disease, unspecified: Secondary | ICD-10-CM | POA: Diagnosis not present

## 2015-08-29 DIAGNOSIS — N2581 Secondary hyperparathyroidism of renal origin: Secondary | ICD-10-CM | POA: Diagnosis not present

## 2015-08-29 DIAGNOSIS — I7789 Other specified disorders of arteries and arterioles: Secondary | ICD-10-CM | POA: Diagnosis not present

## 2015-08-30 DIAGNOSIS — E440 Moderate protein-calorie malnutrition: Secondary | ICD-10-CM | POA: Diagnosis not present

## 2015-08-30 DIAGNOSIS — R8299 Other abnormal findings in urine: Secondary | ICD-10-CM | POA: Diagnosis not present

## 2015-08-30 DIAGNOSIS — D509 Iron deficiency anemia, unspecified: Secondary | ICD-10-CM | POA: Diagnosis not present

## 2015-08-30 DIAGNOSIS — D630 Anemia in neoplastic disease: Secondary | ICD-10-CM | POA: Diagnosis not present

## 2015-08-30 DIAGNOSIS — R17 Unspecified jaundice: Secondary | ICD-10-CM | POA: Diagnosis not present

## 2015-08-30 DIAGNOSIS — K769 Liver disease, unspecified: Secondary | ICD-10-CM | POA: Diagnosis not present

## 2015-08-30 DIAGNOSIS — Z79899 Other long term (current) drug therapy: Secondary | ICD-10-CM | POA: Diagnosis not present

## 2015-08-30 DIAGNOSIS — N186 End stage renal disease: Secondary | ICD-10-CM | POA: Diagnosis not present

## 2015-08-30 DIAGNOSIS — D631 Anemia in chronic kidney disease: Secondary | ICD-10-CM | POA: Diagnosis not present

## 2015-08-30 DIAGNOSIS — Z4932 Encounter for adequacy testing for peritoneal dialysis: Secondary | ICD-10-CM | POA: Diagnosis not present

## 2015-08-31 DIAGNOSIS — R17 Unspecified jaundice: Secondary | ICD-10-CM | POA: Diagnosis not present

## 2015-08-31 DIAGNOSIS — D631 Anemia in chronic kidney disease: Secondary | ICD-10-CM | POA: Diagnosis not present

## 2015-08-31 DIAGNOSIS — D509 Iron deficiency anemia, unspecified: Secondary | ICD-10-CM | POA: Diagnosis not present

## 2015-08-31 DIAGNOSIS — N186 End stage renal disease: Secondary | ICD-10-CM | POA: Diagnosis not present

## 2015-08-31 DIAGNOSIS — Z79899 Other long term (current) drug therapy: Secondary | ICD-10-CM | POA: Diagnosis not present

## 2015-08-31 DIAGNOSIS — Z4932 Encounter for adequacy testing for peritoneal dialysis: Secondary | ICD-10-CM | POA: Diagnosis not present

## 2015-09-01 DIAGNOSIS — D509 Iron deficiency anemia, unspecified: Secondary | ICD-10-CM | POA: Diagnosis not present

## 2015-09-01 DIAGNOSIS — N186 End stage renal disease: Secondary | ICD-10-CM | POA: Diagnosis not present

## 2015-09-01 DIAGNOSIS — Z79899 Other long term (current) drug therapy: Secondary | ICD-10-CM | POA: Diagnosis not present

## 2015-09-01 DIAGNOSIS — D631 Anemia in chronic kidney disease: Secondary | ICD-10-CM | POA: Diagnosis not present

## 2015-09-01 DIAGNOSIS — R17 Unspecified jaundice: Secondary | ICD-10-CM | POA: Diagnosis not present

## 2015-09-01 DIAGNOSIS — Z4932 Encounter for adequacy testing for peritoneal dialysis: Secondary | ICD-10-CM | POA: Diagnosis not present

## 2015-09-02 DIAGNOSIS — Z4932 Encounter for adequacy testing for peritoneal dialysis: Secondary | ICD-10-CM | POA: Diagnosis not present

## 2015-09-02 DIAGNOSIS — Z79899 Other long term (current) drug therapy: Secondary | ICD-10-CM | POA: Diagnosis not present

## 2015-09-02 DIAGNOSIS — R17 Unspecified jaundice: Secondary | ICD-10-CM | POA: Diagnosis not present

## 2015-09-02 DIAGNOSIS — D509 Iron deficiency anemia, unspecified: Secondary | ICD-10-CM | POA: Diagnosis not present

## 2015-09-02 DIAGNOSIS — N186 End stage renal disease: Secondary | ICD-10-CM | POA: Diagnosis not present

## 2015-09-02 DIAGNOSIS — D631 Anemia in chronic kidney disease: Secondary | ICD-10-CM | POA: Diagnosis not present

## 2015-09-03 DIAGNOSIS — D631 Anemia in chronic kidney disease: Secondary | ICD-10-CM | POA: Diagnosis not present

## 2015-09-03 DIAGNOSIS — N186 End stage renal disease: Secondary | ICD-10-CM | POA: Diagnosis not present

## 2015-09-03 DIAGNOSIS — R17 Unspecified jaundice: Secondary | ICD-10-CM | POA: Diagnosis not present

## 2015-09-03 DIAGNOSIS — D509 Iron deficiency anemia, unspecified: Secondary | ICD-10-CM | POA: Diagnosis not present

## 2015-09-03 DIAGNOSIS — Z79899 Other long term (current) drug therapy: Secondary | ICD-10-CM | POA: Diagnosis not present

## 2015-09-03 DIAGNOSIS — Z4932 Encounter for adequacy testing for peritoneal dialysis: Secondary | ICD-10-CM | POA: Diagnosis not present

## 2015-09-04 DIAGNOSIS — Z4932 Encounter for adequacy testing for peritoneal dialysis: Secondary | ICD-10-CM | POA: Diagnosis not present

## 2015-09-04 DIAGNOSIS — D631 Anemia in chronic kidney disease: Secondary | ICD-10-CM | POA: Diagnosis not present

## 2015-09-04 DIAGNOSIS — D509 Iron deficiency anemia, unspecified: Secondary | ICD-10-CM | POA: Diagnosis not present

## 2015-09-04 DIAGNOSIS — Z79899 Other long term (current) drug therapy: Secondary | ICD-10-CM | POA: Diagnosis not present

## 2015-09-04 DIAGNOSIS — R17 Unspecified jaundice: Secondary | ICD-10-CM | POA: Diagnosis not present

## 2015-09-04 DIAGNOSIS — N186 End stage renal disease: Secondary | ICD-10-CM | POA: Diagnosis not present

## 2015-09-05 DIAGNOSIS — D631 Anemia in chronic kidney disease: Secondary | ICD-10-CM | POA: Diagnosis not present

## 2015-09-05 DIAGNOSIS — Z4932 Encounter for adequacy testing for peritoneal dialysis: Secondary | ICD-10-CM | POA: Diagnosis not present

## 2015-09-05 DIAGNOSIS — Z79899 Other long term (current) drug therapy: Secondary | ICD-10-CM | POA: Diagnosis not present

## 2015-09-05 DIAGNOSIS — N186 End stage renal disease: Secondary | ICD-10-CM | POA: Diagnosis not present

## 2015-09-05 DIAGNOSIS — R17 Unspecified jaundice: Secondary | ICD-10-CM | POA: Diagnosis not present

## 2015-09-05 DIAGNOSIS — D509 Iron deficiency anemia, unspecified: Secondary | ICD-10-CM | POA: Diagnosis not present

## 2015-09-06 DIAGNOSIS — N186 End stage renal disease: Secondary | ICD-10-CM | POA: Diagnosis not present

## 2015-09-06 DIAGNOSIS — D631 Anemia in chronic kidney disease: Secondary | ICD-10-CM | POA: Diagnosis not present

## 2015-09-06 DIAGNOSIS — Z4932 Encounter for adequacy testing for peritoneal dialysis: Secondary | ICD-10-CM | POA: Diagnosis not present

## 2015-09-06 DIAGNOSIS — D509 Iron deficiency anemia, unspecified: Secondary | ICD-10-CM | POA: Diagnosis not present

## 2015-09-06 DIAGNOSIS — F319 Bipolar disorder, unspecified: Secondary | ICD-10-CM | POA: Diagnosis not present

## 2015-09-06 DIAGNOSIS — Z79899 Other long term (current) drug therapy: Secondary | ICD-10-CM | POA: Diagnosis not present

## 2015-09-06 DIAGNOSIS — R17 Unspecified jaundice: Secondary | ICD-10-CM | POA: Diagnosis not present

## 2015-09-07 DIAGNOSIS — R17 Unspecified jaundice: Secondary | ICD-10-CM | POA: Diagnosis not present

## 2015-09-07 DIAGNOSIS — D509 Iron deficiency anemia, unspecified: Secondary | ICD-10-CM | POA: Diagnosis not present

## 2015-09-07 DIAGNOSIS — D631 Anemia in chronic kidney disease: Secondary | ICD-10-CM | POA: Diagnosis not present

## 2015-09-07 DIAGNOSIS — Z4932 Encounter for adequacy testing for peritoneal dialysis: Secondary | ICD-10-CM | POA: Diagnosis not present

## 2015-09-07 DIAGNOSIS — N186 End stage renal disease: Secondary | ICD-10-CM | POA: Diagnosis not present

## 2015-09-07 DIAGNOSIS — Z79899 Other long term (current) drug therapy: Secondary | ICD-10-CM | POA: Diagnosis not present

## 2015-09-08 DIAGNOSIS — R17 Unspecified jaundice: Secondary | ICD-10-CM | POA: Diagnosis not present

## 2015-09-08 DIAGNOSIS — Z4932 Encounter for adequacy testing for peritoneal dialysis: Secondary | ICD-10-CM | POA: Diagnosis not present

## 2015-09-08 DIAGNOSIS — D509 Iron deficiency anemia, unspecified: Secondary | ICD-10-CM | POA: Diagnosis not present

## 2015-09-08 DIAGNOSIS — D631 Anemia in chronic kidney disease: Secondary | ICD-10-CM | POA: Diagnosis not present

## 2015-09-08 DIAGNOSIS — N186 End stage renal disease: Secondary | ICD-10-CM | POA: Diagnosis not present

## 2015-09-08 DIAGNOSIS — Z79899 Other long term (current) drug therapy: Secondary | ICD-10-CM | POA: Diagnosis not present

## 2015-09-09 DIAGNOSIS — Z79899 Other long term (current) drug therapy: Secondary | ICD-10-CM | POA: Diagnosis not present

## 2015-09-09 DIAGNOSIS — D631 Anemia in chronic kidney disease: Secondary | ICD-10-CM | POA: Diagnosis not present

## 2015-09-09 DIAGNOSIS — Z4932 Encounter for adequacy testing for peritoneal dialysis: Secondary | ICD-10-CM | POA: Diagnosis not present

## 2015-09-09 DIAGNOSIS — R17 Unspecified jaundice: Secondary | ICD-10-CM | POA: Diagnosis not present

## 2015-09-09 DIAGNOSIS — D509 Iron deficiency anemia, unspecified: Secondary | ICD-10-CM | POA: Diagnosis not present

## 2015-09-09 DIAGNOSIS — N186 End stage renal disease: Secondary | ICD-10-CM | POA: Diagnosis not present

## 2015-09-10 DIAGNOSIS — Z4932 Encounter for adequacy testing for peritoneal dialysis: Secondary | ICD-10-CM | POA: Diagnosis not present

## 2015-09-10 DIAGNOSIS — R17 Unspecified jaundice: Secondary | ICD-10-CM | POA: Diagnosis not present

## 2015-09-10 DIAGNOSIS — Z79899 Other long term (current) drug therapy: Secondary | ICD-10-CM | POA: Diagnosis not present

## 2015-09-10 DIAGNOSIS — D509 Iron deficiency anemia, unspecified: Secondary | ICD-10-CM | POA: Diagnosis not present

## 2015-09-10 DIAGNOSIS — D631 Anemia in chronic kidney disease: Secondary | ICD-10-CM | POA: Diagnosis not present

## 2015-09-10 DIAGNOSIS — N186 End stage renal disease: Secondary | ICD-10-CM | POA: Diagnosis not present

## 2015-09-11 DIAGNOSIS — D509 Iron deficiency anemia, unspecified: Secondary | ICD-10-CM | POA: Diagnosis not present

## 2015-09-11 DIAGNOSIS — R17 Unspecified jaundice: Secondary | ICD-10-CM | POA: Diagnosis not present

## 2015-09-11 DIAGNOSIS — Z4932 Encounter for adequacy testing for peritoneal dialysis: Secondary | ICD-10-CM | POA: Diagnosis not present

## 2015-09-11 DIAGNOSIS — D631 Anemia in chronic kidney disease: Secondary | ICD-10-CM | POA: Diagnosis not present

## 2015-09-11 DIAGNOSIS — N186 End stage renal disease: Secondary | ICD-10-CM | POA: Diagnosis not present

## 2015-09-11 DIAGNOSIS — Z79899 Other long term (current) drug therapy: Secondary | ICD-10-CM | POA: Diagnosis not present

## 2015-09-12 DIAGNOSIS — Z79899 Other long term (current) drug therapy: Secondary | ICD-10-CM | POA: Diagnosis not present

## 2015-09-12 DIAGNOSIS — D631 Anemia in chronic kidney disease: Secondary | ICD-10-CM | POA: Diagnosis not present

## 2015-09-12 DIAGNOSIS — Z4932 Encounter for adequacy testing for peritoneal dialysis: Secondary | ICD-10-CM | POA: Diagnosis not present

## 2015-09-12 DIAGNOSIS — D509 Iron deficiency anemia, unspecified: Secondary | ICD-10-CM | POA: Diagnosis not present

## 2015-09-12 DIAGNOSIS — N186 End stage renal disease: Secondary | ICD-10-CM | POA: Diagnosis not present

## 2015-09-12 DIAGNOSIS — R17 Unspecified jaundice: Secondary | ICD-10-CM | POA: Diagnosis not present

## 2015-09-13 DIAGNOSIS — D631 Anemia in chronic kidney disease: Secondary | ICD-10-CM | POA: Diagnosis not present

## 2015-09-13 DIAGNOSIS — R17 Unspecified jaundice: Secondary | ICD-10-CM | POA: Diagnosis not present

## 2015-09-13 DIAGNOSIS — Z4932 Encounter for adequacy testing for peritoneal dialysis: Secondary | ICD-10-CM | POA: Diagnosis not present

## 2015-09-13 DIAGNOSIS — Z79899 Other long term (current) drug therapy: Secondary | ICD-10-CM | POA: Diagnosis not present

## 2015-09-13 DIAGNOSIS — N186 End stage renal disease: Secondary | ICD-10-CM | POA: Diagnosis not present

## 2015-09-13 DIAGNOSIS — D509 Iron deficiency anemia, unspecified: Secondary | ICD-10-CM | POA: Diagnosis not present

## 2015-09-14 DIAGNOSIS — R17 Unspecified jaundice: Secondary | ICD-10-CM | POA: Diagnosis not present

## 2015-09-14 DIAGNOSIS — N186 End stage renal disease: Secondary | ICD-10-CM | POA: Diagnosis not present

## 2015-09-14 DIAGNOSIS — Z79899 Other long term (current) drug therapy: Secondary | ICD-10-CM | POA: Diagnosis not present

## 2015-09-14 DIAGNOSIS — D509 Iron deficiency anemia, unspecified: Secondary | ICD-10-CM | POA: Diagnosis not present

## 2015-09-14 DIAGNOSIS — D631 Anemia in chronic kidney disease: Secondary | ICD-10-CM | POA: Diagnosis not present

## 2015-09-14 DIAGNOSIS — Z4932 Encounter for adequacy testing for peritoneal dialysis: Secondary | ICD-10-CM | POA: Diagnosis not present

## 2015-09-15 DIAGNOSIS — Z4932 Encounter for adequacy testing for peritoneal dialysis: Secondary | ICD-10-CM | POA: Diagnosis not present

## 2015-09-15 DIAGNOSIS — Z79899 Other long term (current) drug therapy: Secondary | ICD-10-CM | POA: Diagnosis not present

## 2015-09-15 DIAGNOSIS — D631 Anemia in chronic kidney disease: Secondary | ICD-10-CM | POA: Diagnosis not present

## 2015-09-15 DIAGNOSIS — N186 End stage renal disease: Secondary | ICD-10-CM | POA: Diagnosis not present

## 2015-09-15 DIAGNOSIS — R17 Unspecified jaundice: Secondary | ICD-10-CM | POA: Diagnosis not present

## 2015-09-15 DIAGNOSIS — D509 Iron deficiency anemia, unspecified: Secondary | ICD-10-CM | POA: Diagnosis not present

## 2015-09-16 ENCOUNTER — Telehealth: Payer: Self-pay | Admitting: Family Medicine

## 2015-09-16 DIAGNOSIS — Z79899 Other long term (current) drug therapy: Secondary | ICD-10-CM | POA: Diagnosis not present

## 2015-09-16 DIAGNOSIS — Z4932 Encounter for adequacy testing for peritoneal dialysis: Secondary | ICD-10-CM | POA: Diagnosis not present

## 2015-09-16 DIAGNOSIS — D631 Anemia in chronic kidney disease: Secondary | ICD-10-CM | POA: Diagnosis not present

## 2015-09-16 DIAGNOSIS — N186 End stage renal disease: Secondary | ICD-10-CM | POA: Diagnosis not present

## 2015-09-16 DIAGNOSIS — D509 Iron deficiency anemia, unspecified: Secondary | ICD-10-CM | POA: Diagnosis not present

## 2015-09-16 DIAGNOSIS — R17 Unspecified jaundice: Secondary | ICD-10-CM | POA: Diagnosis not present

## 2015-09-17 DIAGNOSIS — Z79899 Other long term (current) drug therapy: Secondary | ICD-10-CM | POA: Diagnosis not present

## 2015-09-17 DIAGNOSIS — Z4932 Encounter for adequacy testing for peritoneal dialysis: Secondary | ICD-10-CM | POA: Diagnosis not present

## 2015-09-17 DIAGNOSIS — D631 Anemia in chronic kidney disease: Secondary | ICD-10-CM | POA: Diagnosis not present

## 2015-09-17 DIAGNOSIS — R17 Unspecified jaundice: Secondary | ICD-10-CM | POA: Diagnosis not present

## 2015-09-17 DIAGNOSIS — D509 Iron deficiency anemia, unspecified: Secondary | ICD-10-CM | POA: Diagnosis not present

## 2015-09-17 DIAGNOSIS — N186 End stage renal disease: Secondary | ICD-10-CM | POA: Diagnosis not present

## 2015-09-18 DIAGNOSIS — N186 End stage renal disease: Secondary | ICD-10-CM | POA: Diagnosis not present

## 2015-09-18 DIAGNOSIS — Z4932 Encounter for adequacy testing for peritoneal dialysis: Secondary | ICD-10-CM | POA: Diagnosis not present

## 2015-09-18 DIAGNOSIS — D631 Anemia in chronic kidney disease: Secondary | ICD-10-CM | POA: Diagnosis not present

## 2015-09-18 DIAGNOSIS — D509 Iron deficiency anemia, unspecified: Secondary | ICD-10-CM | POA: Diagnosis not present

## 2015-09-18 DIAGNOSIS — R17 Unspecified jaundice: Secondary | ICD-10-CM | POA: Diagnosis not present

## 2015-09-18 DIAGNOSIS — Z79899 Other long term (current) drug therapy: Secondary | ICD-10-CM | POA: Diagnosis not present

## 2015-09-19 DIAGNOSIS — Z4932 Encounter for adequacy testing for peritoneal dialysis: Secondary | ICD-10-CM | POA: Diagnosis not present

## 2015-09-19 DIAGNOSIS — D509 Iron deficiency anemia, unspecified: Secondary | ICD-10-CM | POA: Diagnosis not present

## 2015-09-19 DIAGNOSIS — D631 Anemia in chronic kidney disease: Secondary | ICD-10-CM | POA: Diagnosis not present

## 2015-09-19 DIAGNOSIS — N186 End stage renal disease: Secondary | ICD-10-CM | POA: Diagnosis not present

## 2015-09-19 DIAGNOSIS — R17 Unspecified jaundice: Secondary | ICD-10-CM | POA: Diagnosis not present

## 2015-09-19 DIAGNOSIS — Z79899 Other long term (current) drug therapy: Secondary | ICD-10-CM | POA: Diagnosis not present

## 2015-09-20 DIAGNOSIS — D631 Anemia in chronic kidney disease: Secondary | ICD-10-CM | POA: Diagnosis not present

## 2015-09-20 DIAGNOSIS — Z79899 Other long term (current) drug therapy: Secondary | ICD-10-CM | POA: Diagnosis not present

## 2015-09-20 DIAGNOSIS — D509 Iron deficiency anemia, unspecified: Secondary | ICD-10-CM | POA: Diagnosis not present

## 2015-09-20 DIAGNOSIS — N186 End stage renal disease: Secondary | ICD-10-CM | POA: Diagnosis not present

## 2015-09-20 DIAGNOSIS — Z4932 Encounter for adequacy testing for peritoneal dialysis: Secondary | ICD-10-CM | POA: Diagnosis not present

## 2015-09-20 DIAGNOSIS — R17 Unspecified jaundice: Secondary | ICD-10-CM | POA: Diagnosis not present

## 2015-09-21 DIAGNOSIS — Z79899 Other long term (current) drug therapy: Secondary | ICD-10-CM | POA: Diagnosis not present

## 2015-09-21 DIAGNOSIS — N186 End stage renal disease: Secondary | ICD-10-CM | POA: Diagnosis not present

## 2015-09-21 DIAGNOSIS — D631 Anemia in chronic kidney disease: Secondary | ICD-10-CM | POA: Diagnosis not present

## 2015-09-21 DIAGNOSIS — R17 Unspecified jaundice: Secondary | ICD-10-CM | POA: Diagnosis not present

## 2015-09-21 DIAGNOSIS — D509 Iron deficiency anemia, unspecified: Secondary | ICD-10-CM | POA: Diagnosis not present

## 2015-09-21 DIAGNOSIS — Z4932 Encounter for adequacy testing for peritoneal dialysis: Secondary | ICD-10-CM | POA: Diagnosis not present

## 2015-09-22 DIAGNOSIS — N186 End stage renal disease: Secondary | ICD-10-CM | POA: Diagnosis not present

## 2015-09-22 DIAGNOSIS — R17 Unspecified jaundice: Secondary | ICD-10-CM | POA: Diagnosis not present

## 2015-09-22 DIAGNOSIS — Z79899 Other long term (current) drug therapy: Secondary | ICD-10-CM | POA: Diagnosis not present

## 2015-09-22 DIAGNOSIS — D509 Iron deficiency anemia, unspecified: Secondary | ICD-10-CM | POA: Diagnosis not present

## 2015-09-22 DIAGNOSIS — D631 Anemia in chronic kidney disease: Secondary | ICD-10-CM | POA: Diagnosis not present

## 2015-09-22 DIAGNOSIS — Z4932 Encounter for adequacy testing for peritoneal dialysis: Secondary | ICD-10-CM | POA: Diagnosis not present

## 2015-09-23 DIAGNOSIS — D631 Anemia in chronic kidney disease: Secondary | ICD-10-CM | POA: Diagnosis not present

## 2015-09-23 DIAGNOSIS — R17 Unspecified jaundice: Secondary | ICD-10-CM | POA: Diagnosis not present

## 2015-09-23 DIAGNOSIS — Z4932 Encounter for adequacy testing for peritoneal dialysis: Secondary | ICD-10-CM | POA: Diagnosis not present

## 2015-09-23 DIAGNOSIS — N186 End stage renal disease: Secondary | ICD-10-CM | POA: Diagnosis not present

## 2015-09-23 DIAGNOSIS — Z79899 Other long term (current) drug therapy: Secondary | ICD-10-CM | POA: Diagnosis not present

## 2015-09-23 DIAGNOSIS — D509 Iron deficiency anemia, unspecified: Secondary | ICD-10-CM | POA: Diagnosis not present

## 2015-09-24 DIAGNOSIS — D509 Iron deficiency anemia, unspecified: Secondary | ICD-10-CM | POA: Diagnosis not present

## 2015-09-24 DIAGNOSIS — Z4932 Encounter for adequacy testing for peritoneal dialysis: Secondary | ICD-10-CM | POA: Diagnosis not present

## 2015-09-24 DIAGNOSIS — R17 Unspecified jaundice: Secondary | ICD-10-CM | POA: Diagnosis not present

## 2015-09-24 DIAGNOSIS — Z79899 Other long term (current) drug therapy: Secondary | ICD-10-CM | POA: Diagnosis not present

## 2015-09-24 DIAGNOSIS — D631 Anemia in chronic kidney disease: Secondary | ICD-10-CM | POA: Diagnosis not present

## 2015-09-24 DIAGNOSIS — N186 End stage renal disease: Secondary | ICD-10-CM | POA: Diagnosis not present

## 2015-09-25 DIAGNOSIS — N186 End stage renal disease: Secondary | ICD-10-CM | POA: Diagnosis not present

## 2015-09-25 DIAGNOSIS — R17 Unspecified jaundice: Secondary | ICD-10-CM | POA: Diagnosis not present

## 2015-09-25 DIAGNOSIS — Z79899 Other long term (current) drug therapy: Secondary | ICD-10-CM | POA: Diagnosis not present

## 2015-09-25 DIAGNOSIS — D509 Iron deficiency anemia, unspecified: Secondary | ICD-10-CM | POA: Diagnosis not present

## 2015-09-25 DIAGNOSIS — D631 Anemia in chronic kidney disease: Secondary | ICD-10-CM | POA: Diagnosis not present

## 2015-09-25 DIAGNOSIS — Z4932 Encounter for adequacy testing for peritoneal dialysis: Secondary | ICD-10-CM | POA: Diagnosis not present

## 2015-09-26 ENCOUNTER — Other Ambulatory Visit: Payer: Self-pay | Admitting: Family Medicine

## 2015-09-26 DIAGNOSIS — D509 Iron deficiency anemia, unspecified: Secondary | ICD-10-CM | POA: Diagnosis not present

## 2015-09-26 DIAGNOSIS — Z79899 Other long term (current) drug therapy: Secondary | ICD-10-CM | POA: Diagnosis not present

## 2015-09-26 DIAGNOSIS — R17 Unspecified jaundice: Secondary | ICD-10-CM | POA: Diagnosis not present

## 2015-09-26 DIAGNOSIS — N186 End stage renal disease: Secondary | ICD-10-CM | POA: Diagnosis not present

## 2015-09-26 DIAGNOSIS — Z4932 Encounter for adequacy testing for peritoneal dialysis: Secondary | ICD-10-CM | POA: Diagnosis not present

## 2015-09-26 DIAGNOSIS — D631 Anemia in chronic kidney disease: Secondary | ICD-10-CM | POA: Diagnosis not present

## 2015-09-27 DIAGNOSIS — D631 Anemia in chronic kidney disease: Secondary | ICD-10-CM | POA: Diagnosis not present

## 2015-09-27 DIAGNOSIS — Z4932 Encounter for adequacy testing for peritoneal dialysis: Secondary | ICD-10-CM | POA: Diagnosis not present

## 2015-09-27 DIAGNOSIS — D509 Iron deficiency anemia, unspecified: Secondary | ICD-10-CM | POA: Diagnosis not present

## 2015-09-27 DIAGNOSIS — Z79899 Other long term (current) drug therapy: Secondary | ICD-10-CM | POA: Diagnosis not present

## 2015-09-27 DIAGNOSIS — N186 End stage renal disease: Secondary | ICD-10-CM | POA: Diagnosis not present

## 2015-09-27 DIAGNOSIS — R17 Unspecified jaundice: Secondary | ICD-10-CM | POA: Diagnosis not present

## 2015-09-27 NOTE — Telephone Encounter (Signed)
Authorize 30 days only. Then contact the patient letting them know that they will need an appointment before any further prescriptions can be sent in. 

## 2015-09-28 DIAGNOSIS — Z79899 Other long term (current) drug therapy: Secondary | ICD-10-CM | POA: Diagnosis not present

## 2015-09-28 DIAGNOSIS — D509 Iron deficiency anemia, unspecified: Secondary | ICD-10-CM | POA: Diagnosis not present

## 2015-09-28 DIAGNOSIS — D631 Anemia in chronic kidney disease: Secondary | ICD-10-CM | POA: Diagnosis not present

## 2015-09-28 DIAGNOSIS — Z4932 Encounter for adequacy testing for peritoneal dialysis: Secondary | ICD-10-CM | POA: Diagnosis not present

## 2015-09-28 DIAGNOSIS — N186 End stage renal disease: Secondary | ICD-10-CM | POA: Diagnosis not present

## 2015-09-28 DIAGNOSIS — R17 Unspecified jaundice: Secondary | ICD-10-CM | POA: Diagnosis not present

## 2015-09-29 DIAGNOSIS — D631 Anemia in chronic kidney disease: Secondary | ICD-10-CM | POA: Diagnosis not present

## 2015-09-29 DIAGNOSIS — N186 End stage renal disease: Secondary | ICD-10-CM | POA: Diagnosis not present

## 2015-09-29 DIAGNOSIS — Z4932 Encounter for adequacy testing for peritoneal dialysis: Secondary | ICD-10-CM | POA: Diagnosis not present

## 2015-09-29 DIAGNOSIS — I7789 Other specified disorders of arteries and arterioles: Secondary | ICD-10-CM | POA: Diagnosis not present

## 2015-09-29 DIAGNOSIS — R17 Unspecified jaundice: Secondary | ICD-10-CM | POA: Diagnosis not present

## 2015-09-29 DIAGNOSIS — Z79899 Other long term (current) drug therapy: Secondary | ICD-10-CM | POA: Diagnosis not present

## 2015-09-29 DIAGNOSIS — Z992 Dependence on renal dialysis: Secondary | ICD-10-CM | POA: Diagnosis not present

## 2015-09-29 DIAGNOSIS — D509 Iron deficiency anemia, unspecified: Secondary | ICD-10-CM | POA: Diagnosis not present

## 2015-09-30 DIAGNOSIS — Z23 Encounter for immunization: Secondary | ICD-10-CM | POA: Diagnosis not present

## 2015-09-30 DIAGNOSIS — D509 Iron deficiency anemia, unspecified: Secondary | ICD-10-CM | POA: Diagnosis not present

## 2015-09-30 DIAGNOSIS — Z4932 Encounter for adequacy testing for peritoneal dialysis: Secondary | ICD-10-CM | POA: Diagnosis not present

## 2015-09-30 DIAGNOSIS — E440 Moderate protein-calorie malnutrition: Secondary | ICD-10-CM | POA: Diagnosis not present

## 2015-09-30 DIAGNOSIS — Z79899 Other long term (current) drug therapy: Secondary | ICD-10-CM | POA: Diagnosis not present

## 2015-09-30 DIAGNOSIS — D631 Anemia in chronic kidney disease: Secondary | ICD-10-CM | POA: Diagnosis not present

## 2015-09-30 DIAGNOSIS — N2581 Secondary hyperparathyroidism of renal origin: Secondary | ICD-10-CM | POA: Diagnosis not present

## 2015-09-30 DIAGNOSIS — K769 Liver disease, unspecified: Secondary | ICD-10-CM | POA: Diagnosis not present

## 2015-09-30 DIAGNOSIS — R17 Unspecified jaundice: Secondary | ICD-10-CM | POA: Diagnosis not present

## 2015-09-30 DIAGNOSIS — N186 End stage renal disease: Secondary | ICD-10-CM | POA: Diagnosis not present

## 2015-10-01 DIAGNOSIS — Z4932 Encounter for adequacy testing for peritoneal dialysis: Secondary | ICD-10-CM | POA: Diagnosis not present

## 2015-10-01 DIAGNOSIS — D509 Iron deficiency anemia, unspecified: Secondary | ICD-10-CM | POA: Diagnosis not present

## 2015-10-01 DIAGNOSIS — R17 Unspecified jaundice: Secondary | ICD-10-CM | POA: Diagnosis not present

## 2015-10-01 DIAGNOSIS — N186 End stage renal disease: Secondary | ICD-10-CM | POA: Diagnosis not present

## 2015-10-01 DIAGNOSIS — Z23 Encounter for immunization: Secondary | ICD-10-CM | POA: Diagnosis not present

## 2015-10-01 DIAGNOSIS — Z79899 Other long term (current) drug therapy: Secondary | ICD-10-CM | POA: Diagnosis not present

## 2015-10-02 DIAGNOSIS — Z23 Encounter for immunization: Secondary | ICD-10-CM | POA: Diagnosis not present

## 2015-10-02 DIAGNOSIS — R17 Unspecified jaundice: Secondary | ICD-10-CM | POA: Diagnosis not present

## 2015-10-02 DIAGNOSIS — Z79899 Other long term (current) drug therapy: Secondary | ICD-10-CM | POA: Diagnosis not present

## 2015-10-02 DIAGNOSIS — Z4932 Encounter for adequacy testing for peritoneal dialysis: Secondary | ICD-10-CM | POA: Diagnosis not present

## 2015-10-02 DIAGNOSIS — D509 Iron deficiency anemia, unspecified: Secondary | ICD-10-CM | POA: Diagnosis not present

## 2015-10-02 DIAGNOSIS — N186 End stage renal disease: Secondary | ICD-10-CM | POA: Diagnosis not present

## 2015-10-03 DIAGNOSIS — Z4932 Encounter for adequacy testing for peritoneal dialysis: Secondary | ICD-10-CM | POA: Diagnosis not present

## 2015-10-03 DIAGNOSIS — R17 Unspecified jaundice: Secondary | ICD-10-CM | POA: Diagnosis not present

## 2015-10-03 DIAGNOSIS — Z23 Encounter for immunization: Secondary | ICD-10-CM | POA: Diagnosis not present

## 2015-10-03 DIAGNOSIS — D509 Iron deficiency anemia, unspecified: Secondary | ICD-10-CM | POA: Diagnosis not present

## 2015-10-03 DIAGNOSIS — N186 End stage renal disease: Secondary | ICD-10-CM | POA: Diagnosis not present

## 2015-10-03 DIAGNOSIS — Z79899 Other long term (current) drug therapy: Secondary | ICD-10-CM | POA: Diagnosis not present

## 2015-10-04 DIAGNOSIS — Z23 Encounter for immunization: Secondary | ICD-10-CM | POA: Diagnosis not present

## 2015-10-04 DIAGNOSIS — N186 End stage renal disease: Secondary | ICD-10-CM | POA: Diagnosis not present

## 2015-10-04 DIAGNOSIS — R17 Unspecified jaundice: Secondary | ICD-10-CM | POA: Diagnosis not present

## 2015-10-04 DIAGNOSIS — Z4932 Encounter for adequacy testing for peritoneal dialysis: Secondary | ICD-10-CM | POA: Diagnosis not present

## 2015-10-04 DIAGNOSIS — R8299 Other abnormal findings in urine: Secondary | ICD-10-CM | POA: Diagnosis not present

## 2015-10-04 DIAGNOSIS — Z79899 Other long term (current) drug therapy: Secondary | ICD-10-CM | POA: Diagnosis not present

## 2015-10-04 DIAGNOSIS — D509 Iron deficiency anemia, unspecified: Secondary | ICD-10-CM | POA: Diagnosis not present

## 2015-10-05 DIAGNOSIS — N186 End stage renal disease: Secondary | ICD-10-CM | POA: Diagnosis not present

## 2015-10-05 DIAGNOSIS — Z23 Encounter for immunization: Secondary | ICD-10-CM | POA: Diagnosis not present

## 2015-10-05 DIAGNOSIS — Z79899 Other long term (current) drug therapy: Secondary | ICD-10-CM | POA: Diagnosis not present

## 2015-10-05 DIAGNOSIS — Z4932 Encounter for adequacy testing for peritoneal dialysis: Secondary | ICD-10-CM | POA: Diagnosis not present

## 2015-10-05 DIAGNOSIS — R17 Unspecified jaundice: Secondary | ICD-10-CM | POA: Diagnosis not present

## 2015-10-05 DIAGNOSIS — D509 Iron deficiency anemia, unspecified: Secondary | ICD-10-CM | POA: Diagnosis not present

## 2015-10-06 DIAGNOSIS — N186 End stage renal disease: Secondary | ICD-10-CM | POA: Diagnosis not present

## 2015-10-06 DIAGNOSIS — D509 Iron deficiency anemia, unspecified: Secondary | ICD-10-CM | POA: Diagnosis not present

## 2015-10-06 DIAGNOSIS — R17 Unspecified jaundice: Secondary | ICD-10-CM | POA: Diagnosis not present

## 2015-10-06 DIAGNOSIS — Z79899 Other long term (current) drug therapy: Secondary | ICD-10-CM | POA: Diagnosis not present

## 2015-10-06 DIAGNOSIS — Z23 Encounter for immunization: Secondary | ICD-10-CM | POA: Diagnosis not present

## 2015-10-06 DIAGNOSIS — Z4932 Encounter for adequacy testing for peritoneal dialysis: Secondary | ICD-10-CM | POA: Diagnosis not present

## 2015-10-07 DIAGNOSIS — Z23 Encounter for immunization: Secondary | ICD-10-CM | POA: Diagnosis not present

## 2015-10-07 DIAGNOSIS — R17 Unspecified jaundice: Secondary | ICD-10-CM | POA: Diagnosis not present

## 2015-10-07 DIAGNOSIS — Z4932 Encounter for adequacy testing for peritoneal dialysis: Secondary | ICD-10-CM | POA: Diagnosis not present

## 2015-10-07 DIAGNOSIS — D509 Iron deficiency anemia, unspecified: Secondary | ICD-10-CM | POA: Diagnosis not present

## 2015-10-07 DIAGNOSIS — N186 End stage renal disease: Secondary | ICD-10-CM | POA: Diagnosis not present

## 2015-10-07 DIAGNOSIS — Z79899 Other long term (current) drug therapy: Secondary | ICD-10-CM | POA: Diagnosis not present

## 2015-10-08 DIAGNOSIS — Z4932 Encounter for adequacy testing for peritoneal dialysis: Secondary | ICD-10-CM | POA: Diagnosis not present

## 2015-10-08 DIAGNOSIS — Z79899 Other long term (current) drug therapy: Secondary | ICD-10-CM | POA: Diagnosis not present

## 2015-10-08 DIAGNOSIS — D509 Iron deficiency anemia, unspecified: Secondary | ICD-10-CM | POA: Diagnosis not present

## 2015-10-08 DIAGNOSIS — N186 End stage renal disease: Secondary | ICD-10-CM | POA: Diagnosis not present

## 2015-10-08 DIAGNOSIS — Z23 Encounter for immunization: Secondary | ICD-10-CM | POA: Diagnosis not present

## 2015-10-08 DIAGNOSIS — R17 Unspecified jaundice: Secondary | ICD-10-CM | POA: Diagnosis not present

## 2015-10-09 DIAGNOSIS — N186 End stage renal disease: Secondary | ICD-10-CM | POA: Diagnosis not present

## 2015-10-09 DIAGNOSIS — Z23 Encounter for immunization: Secondary | ICD-10-CM | POA: Diagnosis not present

## 2015-10-09 DIAGNOSIS — Z79899 Other long term (current) drug therapy: Secondary | ICD-10-CM | POA: Diagnosis not present

## 2015-10-09 DIAGNOSIS — R17 Unspecified jaundice: Secondary | ICD-10-CM | POA: Diagnosis not present

## 2015-10-09 DIAGNOSIS — Z4932 Encounter for adequacy testing for peritoneal dialysis: Secondary | ICD-10-CM | POA: Diagnosis not present

## 2015-10-09 DIAGNOSIS — D509 Iron deficiency anemia, unspecified: Secondary | ICD-10-CM | POA: Diagnosis not present

## 2015-10-10 DIAGNOSIS — Z4932 Encounter for adequacy testing for peritoneal dialysis: Secondary | ICD-10-CM | POA: Diagnosis not present

## 2015-10-10 DIAGNOSIS — D509 Iron deficiency anemia, unspecified: Secondary | ICD-10-CM | POA: Diagnosis not present

## 2015-10-10 DIAGNOSIS — R17 Unspecified jaundice: Secondary | ICD-10-CM | POA: Diagnosis not present

## 2015-10-10 DIAGNOSIS — Z79899 Other long term (current) drug therapy: Secondary | ICD-10-CM | POA: Diagnosis not present

## 2015-10-10 DIAGNOSIS — Z23 Encounter for immunization: Secondary | ICD-10-CM | POA: Diagnosis not present

## 2015-10-10 DIAGNOSIS — N186 End stage renal disease: Secondary | ICD-10-CM | POA: Diagnosis not present

## 2015-10-11 DIAGNOSIS — R17 Unspecified jaundice: Secondary | ICD-10-CM | POA: Diagnosis not present

## 2015-10-11 DIAGNOSIS — N186 End stage renal disease: Secondary | ICD-10-CM | POA: Diagnosis not present

## 2015-10-11 DIAGNOSIS — Z4932 Encounter for adequacy testing for peritoneal dialysis: Secondary | ICD-10-CM | POA: Diagnosis not present

## 2015-10-11 DIAGNOSIS — D509 Iron deficiency anemia, unspecified: Secondary | ICD-10-CM | POA: Diagnosis not present

## 2015-10-11 DIAGNOSIS — Z23 Encounter for immunization: Secondary | ICD-10-CM | POA: Diagnosis not present

## 2015-10-11 DIAGNOSIS — Z79899 Other long term (current) drug therapy: Secondary | ICD-10-CM | POA: Diagnosis not present

## 2015-10-12 ENCOUNTER — Ambulatory Visit: Payer: Medicare Other | Admitting: Pharmacist

## 2015-10-12 DIAGNOSIS — Z23 Encounter for immunization: Secondary | ICD-10-CM | POA: Diagnosis not present

## 2015-10-12 DIAGNOSIS — N186 End stage renal disease: Secondary | ICD-10-CM | POA: Diagnosis not present

## 2015-10-12 DIAGNOSIS — D509 Iron deficiency anemia, unspecified: Secondary | ICD-10-CM | POA: Diagnosis not present

## 2015-10-12 DIAGNOSIS — Z79899 Other long term (current) drug therapy: Secondary | ICD-10-CM | POA: Diagnosis not present

## 2015-10-12 DIAGNOSIS — Z4932 Encounter for adequacy testing for peritoneal dialysis: Secondary | ICD-10-CM | POA: Diagnosis not present

## 2015-10-12 DIAGNOSIS — R17 Unspecified jaundice: Secondary | ICD-10-CM | POA: Diagnosis not present

## 2015-10-13 ENCOUNTER — Ambulatory Visit (INDEPENDENT_AMBULATORY_CARE_PROVIDER_SITE_OTHER): Payer: Medicare Other | Admitting: Pharmacist

## 2015-10-13 ENCOUNTER — Encounter: Payer: Self-pay | Admitting: Pharmacist

## 2015-10-13 VITALS — BP 102/60 | HR 78 | Ht 69.0 in | Wt 140.0 lb

## 2015-10-13 DIAGNOSIS — Z23 Encounter for immunization: Secondary | ICD-10-CM | POA: Diagnosis not present

## 2015-10-13 DIAGNOSIS — Z0000 Encounter for general adult medical examination without abnormal findings: Secondary | ICD-10-CM

## 2015-10-13 DIAGNOSIS — E034 Atrophy of thyroid (acquired): Secondary | ICD-10-CM

## 2015-10-13 DIAGNOSIS — D509 Iron deficiency anemia, unspecified: Secondary | ICD-10-CM | POA: Diagnosis not present

## 2015-10-13 DIAGNOSIS — N186 End stage renal disease: Secondary | ICD-10-CM | POA: Diagnosis not present

## 2015-10-13 DIAGNOSIS — Z1159 Encounter for screening for other viral diseases: Secondary | ICD-10-CM | POA: Diagnosis not present

## 2015-10-13 DIAGNOSIS — Z4932 Encounter for adequacy testing for peritoneal dialysis: Secondary | ICD-10-CM | POA: Diagnosis not present

## 2015-10-13 DIAGNOSIS — R17 Unspecified jaundice: Secondary | ICD-10-CM | POA: Diagnosis not present

## 2015-10-13 DIAGNOSIS — Z79899 Other long term (current) drug therapy: Secondary | ICD-10-CM | POA: Diagnosis not present

## 2015-10-13 DIAGNOSIS — E038 Other specified hypothyroidism: Secondary | ICD-10-CM | POA: Diagnosis not present

## 2015-10-13 DIAGNOSIS — E559 Vitamin D deficiency, unspecified: Secondary | ICD-10-CM

## 2015-10-13 DIAGNOSIS — E039 Hypothyroidism, unspecified: Secondary | ICD-10-CM | POA: Insufficient documentation

## 2015-10-13 NOTE — Patient Instructions (Addendum)
[place] , Thank you for taking time to come for your Medicare Wellness Visit. I appreciate your ongoing commitment to your health goals. Please review the following plan we discussed and let me know if I can assist you in the future.   These are the goals we discussed:  Look for copy of [facility] (important to know where these are kept - you can also bring copy to our office to be placed in our file / electronic chart)  We will request immunization records from [place] and will see which vaccines they have given and fill in as needed.   This is a list of the screening recommended for you and due dates:  Health Maintenance  Topic Date Due  .  Hepatitis C: One time screening is recommended by Center for Disease Control  (CDC) for  adults born from [birth year] through 1965.   Done today  . Tetanus Vaccine  02/22/1965  . Colon Cancer Screening  02/23/1996  . Shingles Vaccine  02/22/2006  . Pneumonia vaccines (1 of 2 - PCV13) 02/23/2011  . Flu Shot  08/29/2016   Health Maintenance, Male A healthy lifestyle and preventative care can promote health and wellness.  Maintain regular health, dental, and eye exams.  Eat a healthy diet. Foods like vegetables, fruits, whole grains, low-fat dairy products, and lean protein foods contain the nutrients you need and are low in calories. Decrease your intake of foods high in solid fats, added sugars, and salt. Get information about a proper diet from your health care provider, if necessary.  Regular physical exercise is one of the most important things you can do for your health. Most adults should get at least 150 minutes of moderate-intensity exercise (any activity that increases your heart rate and causes you to sweat) each week. In addition, most adults need muscle-strengthening exercises on 2 or more days a week.   Maintain a healthy weight. The body mass index (BMI) is a screening tool  to identify possible weight problems. It provides an estimate of body fat based on height and weight. Your health care provider can find your BMI and can help you achieve or maintain a healthy weight. For males 20 years and older:  A BMI below 18.5 is considered underweight.  A BMI of 18.5 to 24.9 is normal.  A BMI of 25 to 29.9 is considered overweight.  A BMI of 30 and above is considered obese.  Maintain normal blood lipids and cholesterol by exercising and minimizing your intake of saturated fat. Eat a balanced diet with plenty of fruits and vegetables. Blood tests for lipids and cholesterol should begin at age 50 and be repeated every 5 years. If your lipid or cholesterol levels are high, you are over age 29, or you are at high risk for heart disease, you may need your cholesterol levels checked more frequently.Ongoing high lipid and cholesterol levels should be treated with medicines if diet and exercise are not working.  If you smoke, find out from your health care provider how to quit. If you do not use tobacco, do not start.  Lung cancer screening is recommended for adults aged 82-80 years who are at high risk for developing lung cancer because of a history of smoking. A yearly low-dose CT scan of the lungs is recommended for people who have at least a 30-pack-year history of smoking and are current smokers or have quit within the past 15 years.  A pack year of smoking is smoking an average of 1 pack of cigarettes a day for 1 year (for example, a 30-pack-year history of smoking could mean smoking 1 pack a day for 30 years or 2 packs a day for 15 years). Yearly screening should continue until the smoker has stopped smoking for at least 15 years. Yearly screening should be stopped for people who develop a health problem that would prevent them from having lung cancer treatment.  If you choose to drink alcohol, do not have more than 2 drinks per day. One drink is considered to be 12 oz (360 mL)  of beer, 5 oz (150 mL) of wine, or 1.5 oz (45 mL) of liquor.  Avoid the use of street drugs. Do not share needles with anyone. Ask for help if you need support or instructions about stopping the use of drugs.  High blood pressure causes heart disease and increases the risk of stroke. High blood pressure is more likely to develop in:  People who have blood pressure in the end of the normal range (100-139/85-89 mm Hg).  People who are overweight or obese.  People who are African American.  If you are 37-83 years of age, have your blood pressure checked every 3-5 years. If you are 51 years of age or older, have your blood pressure checked every year. You should have your blood pressure measured twice--once when you are at a hospital or clinic, and once when you are not at a hospital or clinic. Record the average of the two measurements. To check your blood pressure when you are not at a hospital or clinic, you can use:  An automated blood pressure machine at a pharmacy.  A home blood pressure monitor.  If you are 30-68 years old, ask your health care provider if you should take aspirin to prevent heart disease.  Diabetes screening involves taking a blood sample to check your fasting blood sugar level. This should be done once every 3 years after age 86 if you are at a normal weight and without risk factors for diabetes. Testing should be considered at a younger age or be carried out more frequently if you are overweight and have at least 1 risk factor for diabetes.  Colorectal cancer can be detected and often prevented. Most routine colorectal cancer screening begins at the age of 14 and continues through age 30. However, your health care provider may recommend screening at an earlier age if you have risk factors for colon cancer. On a yearly basis, your health care provider may provide home test kits to check for hidden blood in the stool. A small camera at the end of a tube may be used to  directly examine the colon (sigmoidoscopy or colonoscopy) to detect the earliest forms of colorectal cancer. Talk to your health care provider about this at age 53 when routine screening begins. A direct exam of the colon should be repeated every 5-10 years through age 21, unless early forms of precancerous polyps or small growths are found.  People who are at an increased risk for hepatitis B should be screened for this virus. You are considered at high risk for hepatitis B if:  You were born in a country where hepatitis B occurs often. Talk with your health care provider about which countries are considered high risk.  Your parents were born in a high-risk country and you have not received a shot to protect against hepatitis B (hepatitis B vaccine).  You  have HIV or AIDS.  You use needles to inject street drugs.  You live with, or have sex with, someone who has hepatitis B.  You are a man who has sex with other men (MSM).  You get hemodialysis treatment.  You take certain medicines for conditions like cancer, organ transplantation, and autoimmune conditions.  Hepatitis C blood testing is recommended for all people born from [birth year] through 1965 and any individual with known risk factors for hepatitis C.  Healthy men should no longer receive prostate-specific antigen (PSA) blood tests as part of routine cancer screening. Talk to your health care provider about prostate cancer screening.  Testicular cancer screening is not recommended for adolescents or adult males who have no symptoms. Screening includes self-exam, a health care provider exam, and other screening tests. Consult with your health care provider about any symptoms you have or any concerns you have about testicular cancer.  Practice safe sex. Use condoms and avoid high-risk sexual practices to reduce the spread of sexually transmitted infections (STIs).  You should be screened for STIs, including gonorrhea and chlamydia  if:  You are sexually active and are younger than 24 years.  You are older than 24 years, and your health care provider tells you that you are at risk for this type of infection.  Your sexual activity has changed since you were last screened, and you are at an increased risk for chlamydia or gonorrhea. Ask your health care provider if you are at risk.  If you are at risk of being infected with HIV, it is recommended that you take a prescription medicine daily to prevent HIV infection. This is called pre-exposure prophylaxis (PrEP). You are considered at risk if:  You are a man who has sex with other men (MSM).  You are a heterosexual man who is sexually active with multiple partners.  You take drugs by injection.  You are sexually active with a partner who has HIV.  Talk with your health care provider about whether you are at high risk of being infected with HIV. If you choose to begin PrEP, you should first be tested for HIV. You should then be tested every 3 months for as long as you are taking PrEP.  Use sunscreen. Apply sunscreen liberally and repeatedly throughout the day. You should seek shade when your shadow is shorter than you. Protect yourself by wearing long sleeves, pants, a wide-brimmed hat, and sunglasses year round whenever you are outdoors.  Tell your health care provider of new moles or changes in moles, especially if there is a change in shape or color. Also, tell your health care provider if a mole is larger than the size of a pencil eraser.  A one-time screening for abdominal aortic aneurysm (AAA) and surgical repair of large AAAs by ultrasound is recommended for men aged 84-75 years who are current or former smokers.  Stay current with your vaccines (immunizations).   This information is not intended to replace advice given to you by your health care provider. Make sure you discuss any questions you have with your health care provider.   Document Released:  07/14/2007 Document Revised: 02/05/2014 Document Reviewed: 06/12/2010 Elsevier Interactive Patient Education [employer].
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
## 2015-10-14 ENCOUNTER — Other Ambulatory Visit: Payer: Self-pay | Admitting: Pharmacist

## 2015-10-14 DIAGNOSIS — Z23 Encounter for immunization: Secondary | ICD-10-CM | POA: Diagnosis not present

## 2015-10-14 DIAGNOSIS — D509 Iron deficiency anemia, unspecified: Secondary | ICD-10-CM | POA: Diagnosis not present

## 2015-10-14 DIAGNOSIS — R17 Unspecified jaundice: Secondary | ICD-10-CM | POA: Diagnosis not present

## 2015-10-14 DIAGNOSIS — Z4932 Encounter for adequacy testing for peritoneal dialysis: Secondary | ICD-10-CM | POA: Diagnosis not present

## 2015-10-14 DIAGNOSIS — Z79899 Other long term (current) drug therapy: Secondary | ICD-10-CM | POA: Diagnosis not present

## 2015-10-14 DIAGNOSIS — N186 End stage renal disease: Secondary | ICD-10-CM | POA: Diagnosis not present

## 2015-10-14 LAB — THYROID PANEL WITH TSH
Free Thyroxine Index: 1.8 (ref 1.2–4.9)
T3 Uptake Ratio: 27 % (ref 24–39)
T4 TOTAL: 6.8 ug/dL (ref 4.5–12.0)
TSH: 5.02 u[IU]/mL — AB (ref 0.450–4.500)

## 2015-10-14 LAB — HEPATITIS C ANTIBODY: Hep C Virus Ab: <0.1 s/co ratio (ref 0.0–0.9)

## 2015-10-14 MED ORDER — LEVOTHYROXINE SODIUM 50 MCG PO TABS
50.0000 ug | ORAL_TABLET | Freq: Every day | ORAL | 1 refills | Status: DC
Start: 2015-10-14 — End: 2015-12-08

## 2015-10-15 DIAGNOSIS — Z23 Encounter for immunization: Secondary | ICD-10-CM | POA: Diagnosis not present

## 2015-10-15 DIAGNOSIS — R17 Unspecified jaundice: Secondary | ICD-10-CM | POA: Diagnosis not present

## 2015-10-15 DIAGNOSIS — D509 Iron deficiency anemia, unspecified: Secondary | ICD-10-CM | POA: Diagnosis not present

## 2015-10-15 DIAGNOSIS — Z79899 Other long term (current) drug therapy: Secondary | ICD-10-CM | POA: Diagnosis not present

## 2015-10-15 DIAGNOSIS — N186 End stage renal disease: Secondary | ICD-10-CM | POA: Diagnosis not present

## 2015-10-15 DIAGNOSIS — Z4932 Encounter for adequacy testing for peritoneal dialysis: Secondary | ICD-10-CM | POA: Diagnosis not present

## 2015-10-16 DIAGNOSIS — Z4932 Encounter for adequacy testing for peritoneal dialysis: Secondary | ICD-10-CM | POA: Diagnosis not present

## 2015-10-16 DIAGNOSIS — Z23 Encounter for immunization: Secondary | ICD-10-CM | POA: Diagnosis not present

## 2015-10-16 DIAGNOSIS — D509 Iron deficiency anemia, unspecified: Secondary | ICD-10-CM | POA: Diagnosis not present

## 2015-10-16 DIAGNOSIS — N186 End stage renal disease: Secondary | ICD-10-CM | POA: Diagnosis not present

## 2015-10-16 DIAGNOSIS — R17 Unspecified jaundice: Secondary | ICD-10-CM | POA: Diagnosis not present

## 2015-10-16 DIAGNOSIS — Z79899 Other long term (current) drug therapy: Secondary | ICD-10-CM | POA: Diagnosis not present

## 2015-10-17 DIAGNOSIS — Z23 Encounter for immunization: Secondary | ICD-10-CM | POA: Diagnosis not present

## 2015-10-17 DIAGNOSIS — D509 Iron deficiency anemia, unspecified: Secondary | ICD-10-CM | POA: Diagnosis not present

## 2015-10-17 DIAGNOSIS — Z4932 Encounter for adequacy testing for peritoneal dialysis: Secondary | ICD-10-CM | POA: Diagnosis not present

## 2015-10-17 DIAGNOSIS — Z79899 Other long term (current) drug therapy: Secondary | ICD-10-CM | POA: Diagnosis not present

## 2015-10-17 DIAGNOSIS — R17 Unspecified jaundice: Secondary | ICD-10-CM | POA: Diagnosis not present

## 2015-10-17 DIAGNOSIS — N186 End stage renal disease: Secondary | ICD-10-CM | POA: Diagnosis not present

## 2015-10-18 DIAGNOSIS — Z79899 Other long term (current) drug therapy: Secondary | ICD-10-CM | POA: Diagnosis not present

## 2015-10-18 DIAGNOSIS — Z4932 Encounter for adequacy testing for peritoneal dialysis: Secondary | ICD-10-CM | POA: Diagnosis not present

## 2015-10-18 DIAGNOSIS — Z23 Encounter for immunization: Secondary | ICD-10-CM | POA: Diagnosis not present

## 2015-10-18 DIAGNOSIS — R17 Unspecified jaundice: Secondary | ICD-10-CM | POA: Diagnosis not present

## 2015-10-18 DIAGNOSIS — N186 End stage renal disease: Secondary | ICD-10-CM | POA: Diagnosis not present

## 2015-10-18 DIAGNOSIS — D509 Iron deficiency anemia, unspecified: Secondary | ICD-10-CM | POA: Diagnosis not present

## 2015-10-19 DIAGNOSIS — D509 Iron deficiency anemia, unspecified: Secondary | ICD-10-CM | POA: Diagnosis not present

## 2015-10-19 DIAGNOSIS — R17 Unspecified jaundice: Secondary | ICD-10-CM | POA: Diagnosis not present

## 2015-10-19 DIAGNOSIS — N186 End stage renal disease: Secondary | ICD-10-CM | POA: Diagnosis not present

## 2015-10-19 DIAGNOSIS — Z4932 Encounter for adequacy testing for peritoneal dialysis: Secondary | ICD-10-CM | POA: Diagnosis not present

## 2015-10-19 DIAGNOSIS — Z79899 Other long term (current) drug therapy: Secondary | ICD-10-CM | POA: Diagnosis not present

## 2015-10-19 DIAGNOSIS — Z23 Encounter for immunization: Secondary | ICD-10-CM | POA: Diagnosis not present

## 2015-10-20 DIAGNOSIS — Z4932 Encounter for adequacy testing for peritoneal dialysis: Secondary | ICD-10-CM | POA: Diagnosis not present

## 2015-10-20 DIAGNOSIS — Z23 Encounter for immunization: Secondary | ICD-10-CM | POA: Diagnosis not present

## 2015-10-20 DIAGNOSIS — Z79899 Other long term (current) drug therapy: Secondary | ICD-10-CM | POA: Diagnosis not present

## 2015-10-20 DIAGNOSIS — N186 End stage renal disease: Secondary | ICD-10-CM | POA: Diagnosis not present

## 2015-10-20 DIAGNOSIS — R17 Unspecified jaundice: Secondary | ICD-10-CM | POA: Diagnosis not present

## 2015-10-20 DIAGNOSIS — D509 Iron deficiency anemia, unspecified: Secondary | ICD-10-CM | POA: Diagnosis not present

## 2015-10-21 DIAGNOSIS — Z79899 Other long term (current) drug therapy: Secondary | ICD-10-CM | POA: Diagnosis not present

## 2015-10-21 DIAGNOSIS — N186 End stage renal disease: Secondary | ICD-10-CM | POA: Diagnosis not present

## 2015-10-21 DIAGNOSIS — D509 Iron deficiency anemia, unspecified: Secondary | ICD-10-CM | POA: Diagnosis not present

## 2015-10-21 DIAGNOSIS — Z4932 Encounter for adequacy testing for peritoneal dialysis: Secondary | ICD-10-CM | POA: Diagnosis not present

## 2015-10-21 DIAGNOSIS — Z23 Encounter for immunization: Secondary | ICD-10-CM | POA: Diagnosis not present

## 2015-10-21 DIAGNOSIS — R17 Unspecified jaundice: Secondary | ICD-10-CM | POA: Diagnosis not present

## 2015-10-22 DIAGNOSIS — Z4932 Encounter for adequacy testing for peritoneal dialysis: Secondary | ICD-10-CM | POA: Diagnosis not present

## 2015-10-22 DIAGNOSIS — N186 End stage renal disease: Secondary | ICD-10-CM | POA: Diagnosis not present

## 2015-10-22 DIAGNOSIS — Z79899 Other long term (current) drug therapy: Secondary | ICD-10-CM | POA: Diagnosis not present

## 2015-10-22 DIAGNOSIS — Z23 Encounter for immunization: Secondary | ICD-10-CM | POA: Diagnosis not present

## 2015-10-22 DIAGNOSIS — R17 Unspecified jaundice: Secondary | ICD-10-CM | POA: Diagnosis not present

## 2015-10-22 DIAGNOSIS — D509 Iron deficiency anemia, unspecified: Secondary | ICD-10-CM | POA: Diagnosis not present

## 2015-10-23 DIAGNOSIS — N186 End stage renal disease: Secondary | ICD-10-CM | POA: Diagnosis not present

## 2015-10-23 DIAGNOSIS — Z23 Encounter for immunization: Secondary | ICD-10-CM | POA: Diagnosis not present

## 2015-10-23 DIAGNOSIS — D509 Iron deficiency anemia, unspecified: Secondary | ICD-10-CM | POA: Diagnosis not present

## 2015-10-23 DIAGNOSIS — Z4932 Encounter for adequacy testing for peritoneal dialysis: Secondary | ICD-10-CM | POA: Diagnosis not present

## 2015-10-23 DIAGNOSIS — R17 Unspecified jaundice: Secondary | ICD-10-CM | POA: Diagnosis not present

## 2015-10-23 DIAGNOSIS — Z79899 Other long term (current) drug therapy: Secondary | ICD-10-CM | POA: Diagnosis not present

## 2015-10-24 DIAGNOSIS — R17 Unspecified jaundice: Secondary | ICD-10-CM | POA: Diagnosis not present

## 2015-10-24 DIAGNOSIS — Z4932 Encounter for adequacy testing for peritoneal dialysis: Secondary | ICD-10-CM | POA: Diagnosis not present

## 2015-10-24 DIAGNOSIS — Z23 Encounter for immunization: Secondary | ICD-10-CM | POA: Diagnosis not present

## 2015-10-24 DIAGNOSIS — Z79899 Other long term (current) drug therapy: Secondary | ICD-10-CM | POA: Diagnosis not present

## 2015-10-24 DIAGNOSIS — N186 End stage renal disease: Secondary | ICD-10-CM | POA: Diagnosis not present

## 2015-10-24 DIAGNOSIS — D509 Iron deficiency anemia, unspecified: Secondary | ICD-10-CM | POA: Diagnosis not present

## 2015-10-25 DIAGNOSIS — Z23 Encounter for immunization: Secondary | ICD-10-CM | POA: Diagnosis not present

## 2015-10-25 DIAGNOSIS — Z4932 Encounter for adequacy testing for peritoneal dialysis: Secondary | ICD-10-CM | POA: Diagnosis not present

## 2015-10-25 DIAGNOSIS — D509 Iron deficiency anemia, unspecified: Secondary | ICD-10-CM | POA: Diagnosis not present

## 2015-10-25 DIAGNOSIS — N186 End stage renal disease: Secondary | ICD-10-CM | POA: Diagnosis not present

## 2015-10-25 DIAGNOSIS — Z79899 Other long term (current) drug therapy: Secondary | ICD-10-CM | POA: Diagnosis not present

## 2015-10-25 DIAGNOSIS — R17 Unspecified jaundice: Secondary | ICD-10-CM | POA: Diagnosis not present

## 2015-10-26 DIAGNOSIS — D509 Iron deficiency anemia, unspecified: Secondary | ICD-10-CM | POA: Diagnosis not present

## 2015-10-26 DIAGNOSIS — Z79899 Other long term (current) drug therapy: Secondary | ICD-10-CM | POA: Diagnosis not present

## 2015-10-26 DIAGNOSIS — Z4932 Encounter for adequacy testing for peritoneal dialysis: Secondary | ICD-10-CM | POA: Diagnosis not present

## 2015-10-26 DIAGNOSIS — N186 End stage renal disease: Secondary | ICD-10-CM | POA: Diagnosis not present

## 2015-10-26 DIAGNOSIS — R17 Unspecified jaundice: Secondary | ICD-10-CM | POA: Diagnosis not present

## 2015-10-26 DIAGNOSIS — Z23 Encounter for immunization: Secondary | ICD-10-CM | POA: Diagnosis not present

## 2015-10-27 DIAGNOSIS — Z79899 Other long term (current) drug therapy: Secondary | ICD-10-CM | POA: Diagnosis not present

## 2015-10-27 DIAGNOSIS — D509 Iron deficiency anemia, unspecified: Secondary | ICD-10-CM | POA: Diagnosis not present

## 2015-10-27 DIAGNOSIS — Z4932 Encounter for adequacy testing for peritoneal dialysis: Secondary | ICD-10-CM | POA: Diagnosis not present

## 2015-10-27 DIAGNOSIS — Z23 Encounter for immunization: Secondary | ICD-10-CM | POA: Diagnosis not present

## 2015-10-27 DIAGNOSIS — R17 Unspecified jaundice: Secondary | ICD-10-CM | POA: Diagnosis not present

## 2015-10-27 DIAGNOSIS — N186 End stage renal disease: Secondary | ICD-10-CM | POA: Diagnosis not present

## 2015-10-28 DIAGNOSIS — R17 Unspecified jaundice: Secondary | ICD-10-CM | POA: Diagnosis not present

## 2015-10-28 DIAGNOSIS — Z79899 Other long term (current) drug therapy: Secondary | ICD-10-CM | POA: Diagnosis not present

## 2015-10-28 DIAGNOSIS — N186 End stage renal disease: Secondary | ICD-10-CM | POA: Diagnosis not present

## 2015-10-28 DIAGNOSIS — D509 Iron deficiency anemia, unspecified: Secondary | ICD-10-CM | POA: Diagnosis not present

## 2015-10-28 DIAGNOSIS — Z23 Encounter for immunization: Secondary | ICD-10-CM | POA: Diagnosis not present

## 2015-10-28 DIAGNOSIS — Z4932 Encounter for adequacy testing for peritoneal dialysis: Secondary | ICD-10-CM | POA: Diagnosis not present

## 2015-10-29 DIAGNOSIS — Z79899 Other long term (current) drug therapy: Secondary | ICD-10-CM | POA: Diagnosis not present

## 2015-10-29 DIAGNOSIS — D509 Iron deficiency anemia, unspecified: Secondary | ICD-10-CM | POA: Diagnosis not present

## 2015-10-29 DIAGNOSIS — Z4932 Encounter for adequacy testing for peritoneal dialysis: Secondary | ICD-10-CM | POA: Diagnosis not present

## 2015-10-29 DIAGNOSIS — I7789 Other specified disorders of arteries and arterioles: Secondary | ICD-10-CM | POA: Diagnosis not present

## 2015-10-29 DIAGNOSIS — R17 Unspecified jaundice: Secondary | ICD-10-CM | POA: Diagnosis not present

## 2015-10-29 DIAGNOSIS — Z23 Encounter for immunization: Secondary | ICD-10-CM | POA: Diagnosis not present

## 2015-10-29 DIAGNOSIS — Z992 Dependence on renal dialysis: Secondary | ICD-10-CM | POA: Diagnosis not present

## 2015-10-29 DIAGNOSIS — N186 End stage renal disease: Secondary | ICD-10-CM | POA: Diagnosis not present

## 2015-10-30 DIAGNOSIS — D630 Anemia in neoplastic disease: Secondary | ICD-10-CM | POA: Diagnosis not present

## 2015-10-30 DIAGNOSIS — N2589 Other disorders resulting from impaired renal tubular function: Secondary | ICD-10-CM | POA: Diagnosis not present

## 2015-10-30 DIAGNOSIS — K769 Liver disease, unspecified: Secondary | ICD-10-CM | POA: Diagnosis not present

## 2015-10-30 DIAGNOSIS — Z4932 Encounter for adequacy testing for peritoneal dialysis: Secondary | ICD-10-CM | POA: Diagnosis not present

## 2015-10-30 DIAGNOSIS — N186 End stage renal disease: Secondary | ICD-10-CM | POA: Diagnosis not present

## 2015-10-30 DIAGNOSIS — N2581 Secondary hyperparathyroidism of renal origin: Secondary | ICD-10-CM | POA: Diagnosis not present

## 2015-10-30 DIAGNOSIS — D509 Iron deficiency anemia, unspecified: Secondary | ICD-10-CM | POA: Diagnosis not present

## 2015-10-30 DIAGNOSIS — D631 Anemia in chronic kidney disease: Secondary | ICD-10-CM | POA: Diagnosis not present

## 2015-10-30 DIAGNOSIS — Z23 Encounter for immunization: Secondary | ICD-10-CM | POA: Diagnosis not present

## 2015-10-31 DIAGNOSIS — Z4932 Encounter for adequacy testing for peritoneal dialysis: Secondary | ICD-10-CM | POA: Diagnosis not present

## 2015-10-31 DIAGNOSIS — Z23 Encounter for immunization: Secondary | ICD-10-CM | POA: Diagnosis not present

## 2015-10-31 DIAGNOSIS — N186 End stage renal disease: Secondary | ICD-10-CM | POA: Diagnosis not present

## 2015-10-31 DIAGNOSIS — D630 Anemia in neoplastic disease: Secondary | ICD-10-CM | POA: Diagnosis not present

## 2015-10-31 DIAGNOSIS — D509 Iron deficiency anemia, unspecified: Secondary | ICD-10-CM | POA: Diagnosis not present

## 2015-10-31 DIAGNOSIS — N2581 Secondary hyperparathyroidism of renal origin: Secondary | ICD-10-CM | POA: Diagnosis not present

## 2015-11-01 DIAGNOSIS — D630 Anemia in neoplastic disease: Secondary | ICD-10-CM | POA: Diagnosis not present

## 2015-11-01 DIAGNOSIS — E784 Other hyperlipidemia: Secondary | ICD-10-CM | POA: Diagnosis not present

## 2015-11-01 DIAGNOSIS — Z23 Encounter for immunization: Secondary | ICD-10-CM | POA: Diagnosis not present

## 2015-11-01 DIAGNOSIS — N186 End stage renal disease: Secondary | ICD-10-CM | POA: Diagnosis not present

## 2015-11-01 DIAGNOSIS — R8299 Other abnormal findings in urine: Secondary | ICD-10-CM | POA: Diagnosis not present

## 2015-11-01 DIAGNOSIS — Z4932 Encounter for adequacy testing for peritoneal dialysis: Secondary | ICD-10-CM | POA: Diagnosis not present

## 2015-11-01 DIAGNOSIS — D509 Iron deficiency anemia, unspecified: Secondary | ICD-10-CM | POA: Diagnosis not present

## 2015-11-01 DIAGNOSIS — N2581 Secondary hyperparathyroidism of renal origin: Secondary | ICD-10-CM | POA: Diagnosis not present

## 2015-11-02 DIAGNOSIS — D509 Iron deficiency anemia, unspecified: Secondary | ICD-10-CM | POA: Diagnosis not present

## 2015-11-02 DIAGNOSIS — D630 Anemia in neoplastic disease: Secondary | ICD-10-CM | POA: Diagnosis not present

## 2015-11-02 DIAGNOSIS — N186 End stage renal disease: Secondary | ICD-10-CM | POA: Diagnosis not present

## 2015-11-02 DIAGNOSIS — Z23 Encounter for immunization: Secondary | ICD-10-CM | POA: Diagnosis not present

## 2015-11-02 DIAGNOSIS — Z4932 Encounter for adequacy testing for peritoneal dialysis: Secondary | ICD-10-CM | POA: Diagnosis not present

## 2015-11-02 DIAGNOSIS — N2581 Secondary hyperparathyroidism of renal origin: Secondary | ICD-10-CM | POA: Diagnosis not present

## 2015-11-03 DIAGNOSIS — D509 Iron deficiency anemia, unspecified: Secondary | ICD-10-CM | POA: Diagnosis not present

## 2015-11-03 DIAGNOSIS — N2581 Secondary hyperparathyroidism of renal origin: Secondary | ICD-10-CM | POA: Diagnosis not present

## 2015-11-03 DIAGNOSIS — F319 Bipolar disorder, unspecified: Secondary | ICD-10-CM | POA: Diagnosis not present

## 2015-11-03 DIAGNOSIS — D630 Anemia in neoplastic disease: Secondary | ICD-10-CM | POA: Diagnosis not present

## 2015-11-03 DIAGNOSIS — Z4932 Encounter for adequacy testing for peritoneal dialysis: Secondary | ICD-10-CM | POA: Diagnosis not present

## 2015-11-03 DIAGNOSIS — Z23 Encounter for immunization: Secondary | ICD-10-CM | POA: Diagnosis not present

## 2015-11-03 DIAGNOSIS — N186 End stage renal disease: Secondary | ICD-10-CM | POA: Diagnosis not present

## 2015-11-04 DIAGNOSIS — Z23 Encounter for immunization: Secondary | ICD-10-CM | POA: Diagnosis not present

## 2015-11-04 DIAGNOSIS — Z4932 Encounter for adequacy testing for peritoneal dialysis: Secondary | ICD-10-CM | POA: Diagnosis not present

## 2015-11-04 DIAGNOSIS — N2581 Secondary hyperparathyroidism of renal origin: Secondary | ICD-10-CM | POA: Diagnosis not present

## 2015-11-04 DIAGNOSIS — N186 End stage renal disease: Secondary | ICD-10-CM | POA: Diagnosis not present

## 2015-11-04 DIAGNOSIS — D509 Iron deficiency anemia, unspecified: Secondary | ICD-10-CM | POA: Diagnosis not present

## 2015-11-04 DIAGNOSIS — D630 Anemia in neoplastic disease: Secondary | ICD-10-CM | POA: Diagnosis not present

## 2015-11-05 DIAGNOSIS — D509 Iron deficiency anemia, unspecified: Secondary | ICD-10-CM | POA: Diagnosis not present

## 2015-11-05 DIAGNOSIS — Z4932 Encounter for adequacy testing for peritoneal dialysis: Secondary | ICD-10-CM | POA: Diagnosis not present

## 2015-11-05 DIAGNOSIS — N2581 Secondary hyperparathyroidism of renal origin: Secondary | ICD-10-CM | POA: Diagnosis not present

## 2015-11-05 DIAGNOSIS — Z23 Encounter for immunization: Secondary | ICD-10-CM | POA: Diagnosis not present

## 2015-11-05 DIAGNOSIS — N186 End stage renal disease: Secondary | ICD-10-CM | POA: Diagnosis not present

## 2015-11-05 DIAGNOSIS — D630 Anemia in neoplastic disease: Secondary | ICD-10-CM | POA: Diagnosis not present

## 2015-11-06 DIAGNOSIS — N2581 Secondary hyperparathyroidism of renal origin: Secondary | ICD-10-CM | POA: Diagnosis not present

## 2015-11-06 DIAGNOSIS — N186 End stage renal disease: Secondary | ICD-10-CM | POA: Diagnosis not present

## 2015-11-06 DIAGNOSIS — D509 Iron deficiency anemia, unspecified: Secondary | ICD-10-CM | POA: Diagnosis not present

## 2015-11-06 DIAGNOSIS — Z4932 Encounter for adequacy testing for peritoneal dialysis: Secondary | ICD-10-CM | POA: Diagnosis not present

## 2015-11-06 DIAGNOSIS — D630 Anemia in neoplastic disease: Secondary | ICD-10-CM | POA: Diagnosis not present

## 2015-11-06 DIAGNOSIS — Z23 Encounter for immunization: Secondary | ICD-10-CM | POA: Diagnosis not present

## 2015-11-07 DIAGNOSIS — N186 End stage renal disease: Secondary | ICD-10-CM | POA: Diagnosis not present

## 2015-11-07 DIAGNOSIS — D509 Iron deficiency anemia, unspecified: Secondary | ICD-10-CM | POA: Diagnosis not present

## 2015-11-07 DIAGNOSIS — D630 Anemia in neoplastic disease: Secondary | ICD-10-CM | POA: Diagnosis not present

## 2015-11-07 DIAGNOSIS — N2581 Secondary hyperparathyroidism of renal origin: Secondary | ICD-10-CM | POA: Diagnosis not present

## 2015-11-07 DIAGNOSIS — Z4932 Encounter for adequacy testing for peritoneal dialysis: Secondary | ICD-10-CM | POA: Diagnosis not present

## 2015-11-07 DIAGNOSIS — Z23 Encounter for immunization: Secondary | ICD-10-CM | POA: Diagnosis not present

## 2015-11-08 DIAGNOSIS — Z23 Encounter for immunization: Secondary | ICD-10-CM | POA: Diagnosis not present

## 2015-11-08 DIAGNOSIS — N2581 Secondary hyperparathyroidism of renal origin: Secondary | ICD-10-CM | POA: Diagnosis not present

## 2015-11-08 DIAGNOSIS — Z4932 Encounter for adequacy testing for peritoneal dialysis: Secondary | ICD-10-CM | POA: Diagnosis not present

## 2015-11-08 DIAGNOSIS — D630 Anemia in neoplastic disease: Secondary | ICD-10-CM | POA: Diagnosis not present

## 2015-11-08 DIAGNOSIS — D509 Iron deficiency anemia, unspecified: Secondary | ICD-10-CM | POA: Diagnosis not present

## 2015-11-08 DIAGNOSIS — N186 End stage renal disease: Secondary | ICD-10-CM | POA: Diagnosis not present

## 2015-11-09 DIAGNOSIS — Z23 Encounter for immunization: Secondary | ICD-10-CM | POA: Diagnosis not present

## 2015-11-09 DIAGNOSIS — D509 Iron deficiency anemia, unspecified: Secondary | ICD-10-CM | POA: Diagnosis not present

## 2015-11-09 DIAGNOSIS — N186 End stage renal disease: Secondary | ICD-10-CM | POA: Diagnosis not present

## 2015-11-09 DIAGNOSIS — D630 Anemia in neoplastic disease: Secondary | ICD-10-CM | POA: Diagnosis not present

## 2015-11-09 DIAGNOSIS — N2581 Secondary hyperparathyroidism of renal origin: Secondary | ICD-10-CM | POA: Diagnosis not present

## 2015-11-09 DIAGNOSIS — Z4932 Encounter for adequacy testing for peritoneal dialysis: Secondary | ICD-10-CM | POA: Diagnosis not present

## 2015-11-10 DIAGNOSIS — N2581 Secondary hyperparathyroidism of renal origin: Secondary | ICD-10-CM | POA: Diagnosis not present

## 2015-11-10 DIAGNOSIS — D509 Iron deficiency anemia, unspecified: Secondary | ICD-10-CM | POA: Diagnosis not present

## 2015-11-10 DIAGNOSIS — D630 Anemia in neoplastic disease: Secondary | ICD-10-CM | POA: Diagnosis not present

## 2015-11-10 DIAGNOSIS — N186 End stage renal disease: Secondary | ICD-10-CM | POA: Diagnosis not present

## 2015-11-10 DIAGNOSIS — Z4932 Encounter for adequacy testing for peritoneal dialysis: Secondary | ICD-10-CM | POA: Diagnosis not present

## 2015-11-10 DIAGNOSIS — Z23 Encounter for immunization: Secondary | ICD-10-CM | POA: Diagnosis not present

## 2015-11-11 DIAGNOSIS — N2581 Secondary hyperparathyroidism of renal origin: Secondary | ICD-10-CM | POA: Diagnosis not present

## 2015-11-11 DIAGNOSIS — D509 Iron deficiency anemia, unspecified: Secondary | ICD-10-CM | POA: Diagnosis not present

## 2015-11-11 DIAGNOSIS — Z23 Encounter for immunization: Secondary | ICD-10-CM | POA: Diagnosis not present

## 2015-11-11 DIAGNOSIS — N186 End stage renal disease: Secondary | ICD-10-CM | POA: Diagnosis not present

## 2015-11-11 DIAGNOSIS — Z4932 Encounter for adequacy testing for peritoneal dialysis: Secondary | ICD-10-CM | POA: Diagnosis not present

## 2015-11-11 DIAGNOSIS — D630 Anemia in neoplastic disease: Secondary | ICD-10-CM | POA: Diagnosis not present

## 2015-11-12 DIAGNOSIS — N186 End stage renal disease: Secondary | ICD-10-CM | POA: Diagnosis not present

## 2015-11-12 DIAGNOSIS — D509 Iron deficiency anemia, unspecified: Secondary | ICD-10-CM | POA: Diagnosis not present

## 2015-11-12 DIAGNOSIS — Z4932 Encounter for adequacy testing for peritoneal dialysis: Secondary | ICD-10-CM | POA: Diagnosis not present

## 2015-11-12 DIAGNOSIS — D630 Anemia in neoplastic disease: Secondary | ICD-10-CM | POA: Diagnosis not present

## 2015-11-12 DIAGNOSIS — N2581 Secondary hyperparathyroidism of renal origin: Secondary | ICD-10-CM | POA: Diagnosis not present

## 2015-11-12 DIAGNOSIS — Z23 Encounter for immunization: Secondary | ICD-10-CM | POA: Diagnosis not present

## 2015-11-13 DIAGNOSIS — Z23 Encounter for immunization: Secondary | ICD-10-CM | POA: Diagnosis not present

## 2015-11-13 DIAGNOSIS — N186 End stage renal disease: Secondary | ICD-10-CM | POA: Diagnosis not present

## 2015-11-13 DIAGNOSIS — Z4932 Encounter for adequacy testing for peritoneal dialysis: Secondary | ICD-10-CM | POA: Diagnosis not present

## 2015-11-13 DIAGNOSIS — D509 Iron deficiency anemia, unspecified: Secondary | ICD-10-CM | POA: Diagnosis not present

## 2015-11-13 DIAGNOSIS — D630 Anemia in neoplastic disease: Secondary | ICD-10-CM | POA: Diagnosis not present

## 2015-11-13 DIAGNOSIS — N2581 Secondary hyperparathyroidism of renal origin: Secondary | ICD-10-CM | POA: Diagnosis not present

## 2015-11-14 DIAGNOSIS — D509 Iron deficiency anemia, unspecified: Secondary | ICD-10-CM | POA: Diagnosis not present

## 2015-11-14 DIAGNOSIS — N186 End stage renal disease: Secondary | ICD-10-CM | POA: Diagnosis not present

## 2015-11-14 DIAGNOSIS — N2581 Secondary hyperparathyroidism of renal origin: Secondary | ICD-10-CM | POA: Diagnosis not present

## 2015-11-14 DIAGNOSIS — Z4932 Encounter for adequacy testing for peritoneal dialysis: Secondary | ICD-10-CM | POA: Diagnosis not present

## 2015-11-14 DIAGNOSIS — D630 Anemia in neoplastic disease: Secondary | ICD-10-CM | POA: Diagnosis not present

## 2015-11-14 DIAGNOSIS — Z23 Encounter for immunization: Secondary | ICD-10-CM | POA: Diagnosis not present

## 2015-11-15 DIAGNOSIS — Z23 Encounter for immunization: Secondary | ICD-10-CM | POA: Diagnosis not present

## 2015-11-15 DIAGNOSIS — Z4932 Encounter for adequacy testing for peritoneal dialysis: Secondary | ICD-10-CM | POA: Diagnosis not present

## 2015-11-15 DIAGNOSIS — D509 Iron deficiency anemia, unspecified: Secondary | ICD-10-CM | POA: Diagnosis not present

## 2015-11-15 DIAGNOSIS — D630 Anemia in neoplastic disease: Secondary | ICD-10-CM | POA: Diagnosis not present

## 2015-11-15 DIAGNOSIS — N2581 Secondary hyperparathyroidism of renal origin: Secondary | ICD-10-CM | POA: Diagnosis not present

## 2015-11-15 DIAGNOSIS — N186 End stage renal disease: Secondary | ICD-10-CM | POA: Diagnosis not present

## 2015-11-16 DIAGNOSIS — D630 Anemia in neoplastic disease: Secondary | ICD-10-CM | POA: Diagnosis not present

## 2015-11-16 DIAGNOSIS — Z23 Encounter for immunization: Secondary | ICD-10-CM | POA: Diagnosis not present

## 2015-11-16 DIAGNOSIS — N186 End stage renal disease: Secondary | ICD-10-CM | POA: Diagnosis not present

## 2015-11-16 DIAGNOSIS — D509 Iron deficiency anemia, unspecified: Secondary | ICD-10-CM | POA: Diagnosis not present

## 2015-11-16 DIAGNOSIS — N2581 Secondary hyperparathyroidism of renal origin: Secondary | ICD-10-CM | POA: Diagnosis not present

## 2015-11-16 DIAGNOSIS — Z4932 Encounter for adequacy testing for peritoneal dialysis: Secondary | ICD-10-CM | POA: Diagnosis not present

## 2015-11-17 DIAGNOSIS — D630 Anemia in neoplastic disease: Secondary | ICD-10-CM | POA: Diagnosis not present

## 2015-11-17 DIAGNOSIS — D509 Iron deficiency anemia, unspecified: Secondary | ICD-10-CM | POA: Diagnosis not present

## 2015-11-17 DIAGNOSIS — Z23 Encounter for immunization: Secondary | ICD-10-CM | POA: Diagnosis not present

## 2015-11-17 DIAGNOSIS — N2581 Secondary hyperparathyroidism of renal origin: Secondary | ICD-10-CM | POA: Diagnosis not present

## 2015-11-17 DIAGNOSIS — N186 End stage renal disease: Secondary | ICD-10-CM | POA: Diagnosis not present

## 2015-11-17 DIAGNOSIS — Z4932 Encounter for adequacy testing for peritoneal dialysis: Secondary | ICD-10-CM | POA: Diagnosis not present

## 2015-11-18 DIAGNOSIS — Z4932 Encounter for adequacy testing for peritoneal dialysis: Secondary | ICD-10-CM | POA: Diagnosis not present

## 2015-11-18 DIAGNOSIS — Z23 Encounter for immunization: Secondary | ICD-10-CM | POA: Diagnosis not present

## 2015-11-18 DIAGNOSIS — D630 Anemia in neoplastic disease: Secondary | ICD-10-CM | POA: Diagnosis not present

## 2015-11-18 DIAGNOSIS — N186 End stage renal disease: Secondary | ICD-10-CM | POA: Diagnosis not present

## 2015-11-18 DIAGNOSIS — D509 Iron deficiency anemia, unspecified: Secondary | ICD-10-CM | POA: Diagnosis not present

## 2015-11-18 DIAGNOSIS — N2581 Secondary hyperparathyroidism of renal origin: Secondary | ICD-10-CM | POA: Diagnosis not present

## 2015-11-19 DIAGNOSIS — N2581 Secondary hyperparathyroidism of renal origin: Secondary | ICD-10-CM | POA: Diagnosis not present

## 2015-11-19 DIAGNOSIS — N186 End stage renal disease: Secondary | ICD-10-CM | POA: Diagnosis not present

## 2015-11-19 DIAGNOSIS — D509 Iron deficiency anemia, unspecified: Secondary | ICD-10-CM | POA: Diagnosis not present

## 2015-11-19 DIAGNOSIS — Z23 Encounter for immunization: Secondary | ICD-10-CM | POA: Diagnosis not present

## 2015-11-19 DIAGNOSIS — Z4932 Encounter for adequacy testing for peritoneal dialysis: Secondary | ICD-10-CM | POA: Diagnosis not present

## 2015-11-19 DIAGNOSIS — D630 Anemia in neoplastic disease: Secondary | ICD-10-CM | POA: Diagnosis not present

## 2015-11-20 DIAGNOSIS — D509 Iron deficiency anemia, unspecified: Secondary | ICD-10-CM | POA: Diagnosis not present

## 2015-11-20 DIAGNOSIS — D630 Anemia in neoplastic disease: Secondary | ICD-10-CM | POA: Diagnosis not present

## 2015-11-20 DIAGNOSIS — Z4932 Encounter for adequacy testing for peritoneal dialysis: Secondary | ICD-10-CM | POA: Diagnosis not present

## 2015-11-20 DIAGNOSIS — N2581 Secondary hyperparathyroidism of renal origin: Secondary | ICD-10-CM | POA: Diagnosis not present

## 2015-11-20 DIAGNOSIS — N186 End stage renal disease: Secondary | ICD-10-CM | POA: Diagnosis not present

## 2015-11-20 DIAGNOSIS — Z23 Encounter for immunization: Secondary | ICD-10-CM | POA: Diagnosis not present

## 2015-11-21 DIAGNOSIS — Z23 Encounter for immunization: Secondary | ICD-10-CM | POA: Diagnosis not present

## 2015-11-21 DIAGNOSIS — D509 Iron deficiency anemia, unspecified: Secondary | ICD-10-CM | POA: Diagnosis not present

## 2015-11-21 DIAGNOSIS — D630 Anemia in neoplastic disease: Secondary | ICD-10-CM | POA: Diagnosis not present

## 2015-11-21 DIAGNOSIS — N2581 Secondary hyperparathyroidism of renal origin: Secondary | ICD-10-CM | POA: Diagnosis not present

## 2015-11-21 DIAGNOSIS — N186 End stage renal disease: Secondary | ICD-10-CM | POA: Diagnosis not present

## 2015-11-21 DIAGNOSIS — Z4932 Encounter for adequacy testing for peritoneal dialysis: Secondary | ICD-10-CM | POA: Diagnosis not present

## 2015-11-22 DIAGNOSIS — Z23 Encounter for immunization: Secondary | ICD-10-CM | POA: Diagnosis not present

## 2015-11-22 DIAGNOSIS — D509 Iron deficiency anemia, unspecified: Secondary | ICD-10-CM | POA: Diagnosis not present

## 2015-11-22 DIAGNOSIS — N186 End stage renal disease: Secondary | ICD-10-CM | POA: Diagnosis not present

## 2015-11-22 DIAGNOSIS — N2581 Secondary hyperparathyroidism of renal origin: Secondary | ICD-10-CM | POA: Diagnosis not present

## 2015-11-22 DIAGNOSIS — D630 Anemia in neoplastic disease: Secondary | ICD-10-CM | POA: Diagnosis not present

## 2015-11-22 DIAGNOSIS — Z4932 Encounter for adequacy testing for peritoneal dialysis: Secondary | ICD-10-CM | POA: Diagnosis not present

## 2015-11-23 DIAGNOSIS — D509 Iron deficiency anemia, unspecified: Secondary | ICD-10-CM | POA: Diagnosis not present

## 2015-11-23 DIAGNOSIS — N2581 Secondary hyperparathyroidism of renal origin: Secondary | ICD-10-CM | POA: Diagnosis not present

## 2015-11-23 DIAGNOSIS — Z4932 Encounter for adequacy testing for peritoneal dialysis: Secondary | ICD-10-CM | POA: Diagnosis not present

## 2015-11-23 DIAGNOSIS — Z23 Encounter for immunization: Secondary | ICD-10-CM | POA: Diagnosis not present

## 2015-11-23 DIAGNOSIS — D630 Anemia in neoplastic disease: Secondary | ICD-10-CM | POA: Diagnosis not present

## 2015-11-23 DIAGNOSIS — N186 End stage renal disease: Secondary | ICD-10-CM | POA: Diagnosis not present

## 2015-11-24 DIAGNOSIS — N186 End stage renal disease: Secondary | ICD-10-CM | POA: Diagnosis not present

## 2015-11-24 DIAGNOSIS — D630 Anemia in neoplastic disease: Secondary | ICD-10-CM | POA: Diagnosis not present

## 2015-11-24 DIAGNOSIS — Z23 Encounter for immunization: Secondary | ICD-10-CM | POA: Diagnosis not present

## 2015-11-24 DIAGNOSIS — N2581 Secondary hyperparathyroidism of renal origin: Secondary | ICD-10-CM | POA: Diagnosis not present

## 2015-11-24 DIAGNOSIS — D509 Iron deficiency anemia, unspecified: Secondary | ICD-10-CM | POA: Diagnosis not present

## 2015-11-24 DIAGNOSIS — Z4932 Encounter for adequacy testing for peritoneal dialysis: Secondary | ICD-10-CM | POA: Diagnosis not present

## 2015-11-25 DIAGNOSIS — Z4932 Encounter for adequacy testing for peritoneal dialysis: Secondary | ICD-10-CM | POA: Diagnosis not present

## 2015-11-25 DIAGNOSIS — D509 Iron deficiency anemia, unspecified: Secondary | ICD-10-CM | POA: Diagnosis not present

## 2015-11-25 DIAGNOSIS — N2581 Secondary hyperparathyroidism of renal origin: Secondary | ICD-10-CM | POA: Diagnosis not present

## 2015-11-25 DIAGNOSIS — D630 Anemia in neoplastic disease: Secondary | ICD-10-CM | POA: Diagnosis not present

## 2015-11-25 DIAGNOSIS — N186 End stage renal disease: Secondary | ICD-10-CM | POA: Diagnosis not present

## 2015-11-25 DIAGNOSIS — Z23 Encounter for immunization: Secondary | ICD-10-CM | POA: Diagnosis not present

## 2015-11-26 DIAGNOSIS — Z23 Encounter for immunization: Secondary | ICD-10-CM | POA: Diagnosis not present

## 2015-11-26 DIAGNOSIS — D509 Iron deficiency anemia, unspecified: Secondary | ICD-10-CM | POA: Diagnosis not present

## 2015-11-26 DIAGNOSIS — Z4932 Encounter for adequacy testing for peritoneal dialysis: Secondary | ICD-10-CM | POA: Diagnosis not present

## 2015-11-26 DIAGNOSIS — N186 End stage renal disease: Secondary | ICD-10-CM | POA: Diagnosis not present

## 2015-11-26 DIAGNOSIS — D630 Anemia in neoplastic disease: Secondary | ICD-10-CM | POA: Diagnosis not present

## 2015-11-26 DIAGNOSIS — N2581 Secondary hyperparathyroidism of renal origin: Secondary | ICD-10-CM | POA: Diagnosis not present

## 2015-11-27 DIAGNOSIS — Z23 Encounter for immunization: Secondary | ICD-10-CM | POA: Diagnosis not present

## 2015-11-27 DIAGNOSIS — N2581 Secondary hyperparathyroidism of renal origin: Secondary | ICD-10-CM | POA: Diagnosis not present

## 2015-11-27 DIAGNOSIS — D509 Iron deficiency anemia, unspecified: Secondary | ICD-10-CM | POA: Diagnosis not present

## 2015-11-27 DIAGNOSIS — D630 Anemia in neoplastic disease: Secondary | ICD-10-CM | POA: Diagnosis not present

## 2015-11-27 DIAGNOSIS — Z4932 Encounter for adequacy testing for peritoneal dialysis: Secondary | ICD-10-CM | POA: Diagnosis not present

## 2015-11-27 DIAGNOSIS — N186 End stage renal disease: Secondary | ICD-10-CM | POA: Diagnosis not present

## 2015-11-28 DIAGNOSIS — D509 Iron deficiency anemia, unspecified: Secondary | ICD-10-CM | POA: Diagnosis not present

## 2015-11-28 DIAGNOSIS — D630 Anemia in neoplastic disease: Secondary | ICD-10-CM | POA: Diagnosis not present

## 2015-11-28 DIAGNOSIS — Z23 Encounter for immunization: Secondary | ICD-10-CM | POA: Diagnosis not present

## 2015-11-28 DIAGNOSIS — N186 End stage renal disease: Secondary | ICD-10-CM | POA: Diagnosis not present

## 2015-11-28 DIAGNOSIS — Z4932 Encounter for adequacy testing for peritoneal dialysis: Secondary | ICD-10-CM | POA: Diagnosis not present

## 2015-11-28 DIAGNOSIS — N2581 Secondary hyperparathyroidism of renal origin: Secondary | ICD-10-CM | POA: Diagnosis not present

## 2015-11-29 DIAGNOSIS — I7789 Other specified disorders of arteries and arterioles: Secondary | ICD-10-CM | POA: Diagnosis not present

## 2015-11-29 DIAGNOSIS — D509 Iron deficiency anemia, unspecified: Secondary | ICD-10-CM | POA: Diagnosis not present

## 2015-11-29 DIAGNOSIS — Z992 Dependence on renal dialysis: Secondary | ICD-10-CM | POA: Diagnosis not present

## 2015-11-29 DIAGNOSIS — N2581 Secondary hyperparathyroidism of renal origin: Secondary | ICD-10-CM | POA: Diagnosis not present

## 2015-11-29 DIAGNOSIS — Z4932 Encounter for adequacy testing for peritoneal dialysis: Secondary | ICD-10-CM | POA: Diagnosis not present

## 2015-11-29 DIAGNOSIS — N186 End stage renal disease: Secondary | ICD-10-CM | POA: Diagnosis not present

## 2015-11-29 DIAGNOSIS — D630 Anemia in neoplastic disease: Secondary | ICD-10-CM | POA: Diagnosis not present

## 2015-11-29 DIAGNOSIS — Z23 Encounter for immunization: Secondary | ICD-10-CM | POA: Diagnosis not present

## 2015-11-30 DIAGNOSIS — D509 Iron deficiency anemia, unspecified: Secondary | ICD-10-CM | POA: Diagnosis not present

## 2015-11-30 DIAGNOSIS — K769 Liver disease, unspecified: Secondary | ICD-10-CM | POA: Diagnosis not present

## 2015-11-30 DIAGNOSIS — E440 Moderate protein-calorie malnutrition: Secondary | ICD-10-CM | POA: Diagnosis not present

## 2015-11-30 DIAGNOSIS — R17 Unspecified jaundice: Secondary | ICD-10-CM | POA: Diagnosis not present

## 2015-11-30 DIAGNOSIS — Z4932 Encounter for adequacy testing for peritoneal dialysis: Secondary | ICD-10-CM | POA: Diagnosis not present

## 2015-11-30 DIAGNOSIS — N2581 Secondary hyperparathyroidism of renal origin: Secondary | ICD-10-CM | POA: Diagnosis not present

## 2015-11-30 DIAGNOSIS — Z79899 Other long term (current) drug therapy: Secondary | ICD-10-CM | POA: Diagnosis not present

## 2015-11-30 DIAGNOSIS — D631 Anemia in chronic kidney disease: Secondary | ICD-10-CM | POA: Diagnosis not present

## 2015-11-30 DIAGNOSIS — N186 End stage renal disease: Secondary | ICD-10-CM | POA: Diagnosis not present

## 2015-12-01 DIAGNOSIS — N186 End stage renal disease: Secondary | ICD-10-CM | POA: Diagnosis not present

## 2015-12-01 DIAGNOSIS — Z79899 Other long term (current) drug therapy: Secondary | ICD-10-CM | POA: Diagnosis not present

## 2015-12-01 DIAGNOSIS — Z4932 Encounter for adequacy testing for peritoneal dialysis: Secondary | ICD-10-CM | POA: Diagnosis not present

## 2015-12-01 DIAGNOSIS — D631 Anemia in chronic kidney disease: Secondary | ICD-10-CM | POA: Diagnosis not present

## 2015-12-01 DIAGNOSIS — D509 Iron deficiency anemia, unspecified: Secondary | ICD-10-CM | POA: Diagnosis not present

## 2015-12-01 DIAGNOSIS — R17 Unspecified jaundice: Secondary | ICD-10-CM | POA: Diagnosis not present

## 2015-12-02 DIAGNOSIS — R17 Unspecified jaundice: Secondary | ICD-10-CM | POA: Diagnosis not present

## 2015-12-02 DIAGNOSIS — Z79899 Other long term (current) drug therapy: Secondary | ICD-10-CM | POA: Diagnosis not present

## 2015-12-02 DIAGNOSIS — N186 End stage renal disease: Secondary | ICD-10-CM | POA: Diagnosis not present

## 2015-12-02 DIAGNOSIS — D509 Iron deficiency anemia, unspecified: Secondary | ICD-10-CM | POA: Diagnosis not present

## 2015-12-02 DIAGNOSIS — Z4932 Encounter for adequacy testing for peritoneal dialysis: Secondary | ICD-10-CM | POA: Diagnosis not present

## 2015-12-02 DIAGNOSIS — D631 Anemia in chronic kidney disease: Secondary | ICD-10-CM | POA: Diagnosis not present

## 2015-12-03 DIAGNOSIS — Z79899 Other long term (current) drug therapy: Secondary | ICD-10-CM | POA: Diagnosis not present

## 2015-12-03 DIAGNOSIS — Z4932 Encounter for adequacy testing for peritoneal dialysis: Secondary | ICD-10-CM | POA: Diagnosis not present

## 2015-12-03 DIAGNOSIS — N186 End stage renal disease: Secondary | ICD-10-CM | POA: Diagnosis not present

## 2015-12-03 DIAGNOSIS — D509 Iron deficiency anemia, unspecified: Secondary | ICD-10-CM | POA: Diagnosis not present

## 2015-12-03 DIAGNOSIS — R17 Unspecified jaundice: Secondary | ICD-10-CM | POA: Diagnosis not present

## 2015-12-03 DIAGNOSIS — D631 Anemia in chronic kidney disease: Secondary | ICD-10-CM | POA: Diagnosis not present

## 2015-12-04 DIAGNOSIS — D631 Anemia in chronic kidney disease: Secondary | ICD-10-CM | POA: Diagnosis not present

## 2015-12-04 DIAGNOSIS — D509 Iron deficiency anemia, unspecified: Secondary | ICD-10-CM | POA: Diagnosis not present

## 2015-12-04 DIAGNOSIS — N186 End stage renal disease: Secondary | ICD-10-CM | POA: Diagnosis not present

## 2015-12-04 DIAGNOSIS — Z4932 Encounter for adequacy testing for peritoneal dialysis: Secondary | ICD-10-CM | POA: Diagnosis not present

## 2015-12-04 DIAGNOSIS — R17 Unspecified jaundice: Secondary | ICD-10-CM | POA: Diagnosis not present

## 2015-12-04 DIAGNOSIS — Z79899 Other long term (current) drug therapy: Secondary | ICD-10-CM | POA: Diagnosis not present

## 2015-12-05 DIAGNOSIS — N186 End stage renal disease: Secondary | ICD-10-CM | POA: Diagnosis not present

## 2015-12-05 DIAGNOSIS — D509 Iron deficiency anemia, unspecified: Secondary | ICD-10-CM | POA: Diagnosis not present

## 2015-12-05 DIAGNOSIS — Z79899 Other long term (current) drug therapy: Secondary | ICD-10-CM | POA: Diagnosis not present

## 2015-12-05 DIAGNOSIS — Z4932 Encounter for adequacy testing for peritoneal dialysis: Secondary | ICD-10-CM | POA: Diagnosis not present

## 2015-12-05 DIAGNOSIS — R17 Unspecified jaundice: Secondary | ICD-10-CM | POA: Diagnosis not present

## 2015-12-05 DIAGNOSIS — D631 Anemia in chronic kidney disease: Secondary | ICD-10-CM | POA: Diagnosis not present

## 2015-12-06 DIAGNOSIS — D631 Anemia in chronic kidney disease: Secondary | ICD-10-CM | POA: Diagnosis not present

## 2015-12-06 DIAGNOSIS — Z4932 Encounter for adequacy testing for peritoneal dialysis: Secondary | ICD-10-CM | POA: Diagnosis not present

## 2015-12-06 DIAGNOSIS — Z79899 Other long term (current) drug therapy: Secondary | ICD-10-CM | POA: Diagnosis not present

## 2015-12-06 DIAGNOSIS — D509 Iron deficiency anemia, unspecified: Secondary | ICD-10-CM | POA: Diagnosis not present

## 2015-12-06 DIAGNOSIS — N186 End stage renal disease: Secondary | ICD-10-CM | POA: Diagnosis not present

## 2015-12-06 DIAGNOSIS — R17 Unspecified jaundice: Secondary | ICD-10-CM | POA: Diagnosis not present

## 2015-12-07 DIAGNOSIS — N186 End stage renal disease: Secondary | ICD-10-CM | POA: Diagnosis not present

## 2015-12-07 DIAGNOSIS — D631 Anemia in chronic kidney disease: Secondary | ICD-10-CM | POA: Diagnosis not present

## 2015-12-07 DIAGNOSIS — Z4932 Encounter for adequacy testing for peritoneal dialysis: Secondary | ICD-10-CM | POA: Diagnosis not present

## 2015-12-07 DIAGNOSIS — Z79899 Other long term (current) drug therapy: Secondary | ICD-10-CM | POA: Diagnosis not present

## 2015-12-07 DIAGNOSIS — R8299 Other abnormal findings in urine: Secondary | ICD-10-CM | POA: Diagnosis not present

## 2015-12-07 DIAGNOSIS — D509 Iron deficiency anemia, unspecified: Secondary | ICD-10-CM | POA: Diagnosis not present

## 2015-12-07 DIAGNOSIS — R17 Unspecified jaundice: Secondary | ICD-10-CM | POA: Diagnosis not present

## 2015-12-08 ENCOUNTER — Other Ambulatory Visit: Payer: Self-pay | Admitting: Pharmacist

## 2015-12-08 DIAGNOSIS — R17 Unspecified jaundice: Secondary | ICD-10-CM | POA: Diagnosis not present

## 2015-12-08 DIAGNOSIS — D631 Anemia in chronic kidney disease: Secondary | ICD-10-CM | POA: Diagnosis not present

## 2015-12-08 DIAGNOSIS — Z79899 Other long term (current) drug therapy: Secondary | ICD-10-CM | POA: Diagnosis not present

## 2015-12-08 DIAGNOSIS — N186 End stage renal disease: Secondary | ICD-10-CM | POA: Diagnosis not present

## 2015-12-08 DIAGNOSIS — D509 Iron deficiency anemia, unspecified: Secondary | ICD-10-CM | POA: Diagnosis not present

## 2015-12-08 DIAGNOSIS — Z4932 Encounter for adequacy testing for peritoneal dialysis: Secondary | ICD-10-CM | POA: Diagnosis not present

## 2015-12-09 DIAGNOSIS — N186 End stage renal disease: Secondary | ICD-10-CM | POA: Diagnosis not present

## 2015-12-09 DIAGNOSIS — R17 Unspecified jaundice: Secondary | ICD-10-CM | POA: Diagnosis not present

## 2015-12-09 DIAGNOSIS — Z4932 Encounter for adequacy testing for peritoneal dialysis: Secondary | ICD-10-CM | POA: Diagnosis not present

## 2015-12-09 DIAGNOSIS — Z79899 Other long term (current) drug therapy: Secondary | ICD-10-CM | POA: Diagnosis not present

## 2015-12-09 DIAGNOSIS — D631 Anemia in chronic kidney disease: Secondary | ICD-10-CM | POA: Diagnosis not present

## 2015-12-09 DIAGNOSIS — D509 Iron deficiency anemia, unspecified: Secondary | ICD-10-CM | POA: Diagnosis not present

## 2015-12-10 ENCOUNTER — Other Ambulatory Visit (HOSPITAL_COMMUNITY)
Admission: RE | Admit: 2015-12-10 | Discharge: 2015-12-10 | Disposition: A | Discharge status: Home / Self Care | Payer: Medicare Other | Source: Other Acute Inpatient Hospital | Attending: Nephrology | Admitting: Nephrology

## 2015-12-10 DIAGNOSIS — Z79899 Other long term (current) drug therapy: Secondary | ICD-10-CM | POA: Diagnosis not present

## 2015-12-10 DIAGNOSIS — E875 Hyperkalemia: Secondary | ICD-10-CM | POA: Insufficient documentation

## 2015-12-10 DIAGNOSIS — Z4932 Encounter for adequacy testing for peritoneal dialysis: Secondary | ICD-10-CM | POA: Diagnosis not present

## 2015-12-10 DIAGNOSIS — R17 Unspecified jaundice: Secondary | ICD-10-CM | POA: Diagnosis not present

## 2015-12-10 DIAGNOSIS — D509 Iron deficiency anemia, unspecified: Secondary | ICD-10-CM | POA: Diagnosis not present

## 2015-12-10 DIAGNOSIS — D631 Anemia in chronic kidney disease: Secondary | ICD-10-CM | POA: Diagnosis not present

## 2015-12-10 DIAGNOSIS — N186 End stage renal disease: Secondary | ICD-10-CM | POA: Diagnosis not present

## 2015-12-10 LAB — POTASSIUM: POTASSIUM: 4.7 mmol/L (ref 3.5–5.1)

## 2015-12-11 DIAGNOSIS — N186 End stage renal disease: Secondary | ICD-10-CM | POA: Diagnosis not present

## 2015-12-11 DIAGNOSIS — R17 Unspecified jaundice: Secondary | ICD-10-CM | POA: Diagnosis not present

## 2015-12-11 DIAGNOSIS — D631 Anemia in chronic kidney disease: Secondary | ICD-10-CM | POA: Diagnosis not present

## 2015-12-11 DIAGNOSIS — Z4932 Encounter for adequacy testing for peritoneal dialysis: Secondary | ICD-10-CM | POA: Diagnosis not present

## 2015-12-11 DIAGNOSIS — D509 Iron deficiency anemia, unspecified: Secondary | ICD-10-CM | POA: Diagnosis not present

## 2015-12-11 DIAGNOSIS — Z79899 Other long term (current) drug therapy: Secondary | ICD-10-CM | POA: Diagnosis not present

## 2015-12-12 DIAGNOSIS — Z4932 Encounter for adequacy testing for peritoneal dialysis: Secondary | ICD-10-CM | POA: Diagnosis not present

## 2015-12-12 DIAGNOSIS — D631 Anemia in chronic kidney disease: Secondary | ICD-10-CM | POA: Diagnosis not present

## 2015-12-12 DIAGNOSIS — D509 Iron deficiency anemia, unspecified: Secondary | ICD-10-CM | POA: Diagnosis not present

## 2015-12-12 DIAGNOSIS — Z79899 Other long term (current) drug therapy: Secondary | ICD-10-CM | POA: Diagnosis not present

## 2015-12-12 DIAGNOSIS — R17 Unspecified jaundice: Secondary | ICD-10-CM | POA: Diagnosis not present

## 2015-12-12 DIAGNOSIS — N186 End stage renal disease: Secondary | ICD-10-CM | POA: Diagnosis not present

## 2015-12-13 ENCOUNTER — Ambulatory Visit: Payer: Self-pay | Admitting: Family Medicine

## 2015-12-13 DIAGNOSIS — N186 End stage renal disease: Secondary | ICD-10-CM | POA: Diagnosis not present

## 2015-12-13 DIAGNOSIS — Z4932 Encounter for adequacy testing for peritoneal dialysis: Secondary | ICD-10-CM | POA: Diagnosis not present

## 2015-12-13 DIAGNOSIS — R17 Unspecified jaundice: Secondary | ICD-10-CM | POA: Diagnosis not present

## 2015-12-13 DIAGNOSIS — D631 Anemia in chronic kidney disease: Secondary | ICD-10-CM | POA: Diagnosis not present

## 2015-12-13 DIAGNOSIS — D509 Iron deficiency anemia, unspecified: Secondary | ICD-10-CM | POA: Diagnosis not present

## 2015-12-13 DIAGNOSIS — Z79899 Other long term (current) drug therapy: Secondary | ICD-10-CM | POA: Diagnosis not present

## 2015-12-14 DIAGNOSIS — Z4932 Encounter for adequacy testing for peritoneal dialysis: Secondary | ICD-10-CM | POA: Diagnosis not present

## 2015-12-14 DIAGNOSIS — D631 Anemia in chronic kidney disease: Secondary | ICD-10-CM | POA: Diagnosis not present

## 2015-12-14 DIAGNOSIS — R17 Unspecified jaundice: Secondary | ICD-10-CM | POA: Diagnosis not present

## 2015-12-14 DIAGNOSIS — N186 End stage renal disease: Secondary | ICD-10-CM | POA: Diagnosis not present

## 2015-12-14 DIAGNOSIS — D509 Iron deficiency anemia, unspecified: Secondary | ICD-10-CM | POA: Diagnosis not present

## 2015-12-14 DIAGNOSIS — Z79899 Other long term (current) drug therapy: Secondary | ICD-10-CM | POA: Diagnosis not present

## 2015-12-15 DIAGNOSIS — Z4932 Encounter for adequacy testing for peritoneal dialysis: Secondary | ICD-10-CM | POA: Diagnosis not present

## 2015-12-15 DIAGNOSIS — R17 Unspecified jaundice: Secondary | ICD-10-CM | POA: Diagnosis not present

## 2015-12-15 DIAGNOSIS — D509 Iron deficiency anemia, unspecified: Secondary | ICD-10-CM | POA: Diagnosis not present

## 2015-12-15 DIAGNOSIS — Z79899 Other long term (current) drug therapy: Secondary | ICD-10-CM | POA: Diagnosis not present

## 2015-12-15 DIAGNOSIS — D631 Anemia in chronic kidney disease: Secondary | ICD-10-CM | POA: Diagnosis not present

## 2015-12-15 DIAGNOSIS — N186 End stage renal disease: Secondary | ICD-10-CM | POA: Diagnosis not present

## 2015-12-16 DIAGNOSIS — Z4932 Encounter for adequacy testing for peritoneal dialysis: Secondary | ICD-10-CM | POA: Diagnosis not present

## 2015-12-16 DIAGNOSIS — R17 Unspecified jaundice: Secondary | ICD-10-CM | POA: Diagnosis not present

## 2015-12-16 DIAGNOSIS — D631 Anemia in chronic kidney disease: Secondary | ICD-10-CM | POA: Diagnosis not present

## 2015-12-16 DIAGNOSIS — D509 Iron deficiency anemia, unspecified: Secondary | ICD-10-CM | POA: Diagnosis not present

## 2015-12-16 DIAGNOSIS — N186 End stage renal disease: Secondary | ICD-10-CM | POA: Diagnosis not present

## 2015-12-16 DIAGNOSIS — Z79899 Other long term (current) drug therapy: Secondary | ICD-10-CM | POA: Diagnosis not present

## 2015-12-17 DIAGNOSIS — Z79899 Other long term (current) drug therapy: Secondary | ICD-10-CM | POA: Diagnosis not present

## 2015-12-17 DIAGNOSIS — D509 Iron deficiency anemia, unspecified: Secondary | ICD-10-CM | POA: Diagnosis not present

## 2015-12-17 DIAGNOSIS — H6120 Impacted cerumen, unspecified ear: Secondary | ICD-10-CM | POA: Diagnosis not present

## 2015-12-17 DIAGNOSIS — N186 End stage renal disease: Secondary | ICD-10-CM | POA: Diagnosis not present

## 2015-12-17 DIAGNOSIS — Z4932 Encounter for adequacy testing for peritoneal dialysis: Secondary | ICD-10-CM | POA: Diagnosis not present

## 2015-12-17 DIAGNOSIS — R17 Unspecified jaundice: Secondary | ICD-10-CM | POA: Diagnosis not present

## 2015-12-17 DIAGNOSIS — D631 Anemia in chronic kidney disease: Secondary | ICD-10-CM | POA: Diagnosis not present

## 2015-12-18 DIAGNOSIS — N186 End stage renal disease: Secondary | ICD-10-CM | POA: Diagnosis not present

## 2015-12-18 DIAGNOSIS — R17 Unspecified jaundice: Secondary | ICD-10-CM | POA: Diagnosis not present

## 2015-12-18 DIAGNOSIS — D509 Iron deficiency anemia, unspecified: Secondary | ICD-10-CM | POA: Diagnosis not present

## 2015-12-18 DIAGNOSIS — Z79899 Other long term (current) drug therapy: Secondary | ICD-10-CM | POA: Diagnosis not present

## 2015-12-18 DIAGNOSIS — Z4932 Encounter for adequacy testing for peritoneal dialysis: Secondary | ICD-10-CM | POA: Diagnosis not present

## 2015-12-18 DIAGNOSIS — D631 Anemia in chronic kidney disease: Secondary | ICD-10-CM | POA: Diagnosis not present

## 2015-12-19 ENCOUNTER — Ambulatory Visit: Payer: Medicare Other | Admitting: Family Medicine

## 2015-12-19 DIAGNOSIS — Z79899 Other long term (current) drug therapy: Secondary | ICD-10-CM | POA: Diagnosis not present

## 2015-12-19 DIAGNOSIS — D631 Anemia in chronic kidney disease: Secondary | ICD-10-CM | POA: Diagnosis not present

## 2015-12-19 DIAGNOSIS — D509 Iron deficiency anemia, unspecified: Secondary | ICD-10-CM | POA: Diagnosis not present

## 2015-12-19 DIAGNOSIS — N186 End stage renal disease: Secondary | ICD-10-CM | POA: Diagnosis not present

## 2015-12-19 DIAGNOSIS — Z4932 Encounter for adequacy testing for peritoneal dialysis: Secondary | ICD-10-CM | POA: Diagnosis not present

## 2015-12-19 DIAGNOSIS — R17 Unspecified jaundice: Secondary | ICD-10-CM | POA: Diagnosis not present

## 2015-12-20 DIAGNOSIS — D631 Anemia in chronic kidney disease: Secondary | ICD-10-CM | POA: Diagnosis not present

## 2015-12-20 DIAGNOSIS — R17 Unspecified jaundice: Secondary | ICD-10-CM | POA: Diagnosis not present

## 2015-12-20 DIAGNOSIS — N186 End stage renal disease: Secondary | ICD-10-CM | POA: Diagnosis not present

## 2015-12-20 DIAGNOSIS — Z4932 Encounter for adequacy testing for peritoneal dialysis: Secondary | ICD-10-CM | POA: Diagnosis not present

## 2015-12-20 DIAGNOSIS — Z79899 Other long term (current) drug therapy: Secondary | ICD-10-CM | POA: Diagnosis not present

## 2015-12-20 DIAGNOSIS — D509 Iron deficiency anemia, unspecified: Secondary | ICD-10-CM | POA: Diagnosis not present

## 2015-12-21 DIAGNOSIS — D509 Iron deficiency anemia, unspecified: Secondary | ICD-10-CM | POA: Diagnosis not present

## 2015-12-21 DIAGNOSIS — D631 Anemia in chronic kidney disease: Secondary | ICD-10-CM | POA: Diagnosis not present

## 2015-12-21 DIAGNOSIS — R17 Unspecified jaundice: Secondary | ICD-10-CM | POA: Diagnosis not present

## 2015-12-21 DIAGNOSIS — N186 End stage renal disease: Secondary | ICD-10-CM | POA: Diagnosis not present

## 2015-12-21 DIAGNOSIS — Z4932 Encounter for adequacy testing for peritoneal dialysis: Secondary | ICD-10-CM | POA: Diagnosis not present

## 2015-12-21 DIAGNOSIS — Z79899 Other long term (current) drug therapy: Secondary | ICD-10-CM | POA: Diagnosis not present

## 2015-12-22 DIAGNOSIS — Z79899 Other long term (current) drug therapy: Secondary | ICD-10-CM | POA: Diagnosis not present

## 2015-12-22 DIAGNOSIS — D509 Iron deficiency anemia, unspecified: Secondary | ICD-10-CM | POA: Diagnosis not present

## 2015-12-22 DIAGNOSIS — D631 Anemia in chronic kidney disease: Secondary | ICD-10-CM | POA: Diagnosis not present

## 2015-12-22 DIAGNOSIS — R17 Unspecified jaundice: Secondary | ICD-10-CM | POA: Diagnosis not present

## 2015-12-22 DIAGNOSIS — Z4932 Encounter for adequacy testing for peritoneal dialysis: Secondary | ICD-10-CM | POA: Diagnosis not present

## 2015-12-22 DIAGNOSIS — N186 End stage renal disease: Secondary | ICD-10-CM | POA: Diagnosis not present

## 2015-12-23 DIAGNOSIS — D509 Iron deficiency anemia, unspecified: Secondary | ICD-10-CM | POA: Diagnosis not present

## 2015-12-23 DIAGNOSIS — Z79899 Other long term (current) drug therapy: Secondary | ICD-10-CM | POA: Diagnosis not present

## 2015-12-23 DIAGNOSIS — Z4932 Encounter for adequacy testing for peritoneal dialysis: Secondary | ICD-10-CM | POA: Diagnosis not present

## 2015-12-23 DIAGNOSIS — D631 Anemia in chronic kidney disease: Secondary | ICD-10-CM | POA: Diagnosis not present

## 2015-12-23 DIAGNOSIS — N186 End stage renal disease: Secondary | ICD-10-CM | POA: Diagnosis not present

## 2015-12-23 DIAGNOSIS — R17 Unspecified jaundice: Secondary | ICD-10-CM | POA: Diagnosis not present

## 2015-12-24 DIAGNOSIS — D509 Iron deficiency anemia, unspecified: Secondary | ICD-10-CM | POA: Diagnosis not present

## 2015-12-24 DIAGNOSIS — Z79899 Other long term (current) drug therapy: Secondary | ICD-10-CM | POA: Diagnosis not present

## 2015-12-24 DIAGNOSIS — R17 Unspecified jaundice: Secondary | ICD-10-CM | POA: Diagnosis not present

## 2015-12-24 DIAGNOSIS — D631 Anemia in chronic kidney disease: Secondary | ICD-10-CM | POA: Diagnosis not present

## 2015-12-24 DIAGNOSIS — N186 End stage renal disease: Secondary | ICD-10-CM | POA: Diagnosis not present

## 2015-12-24 DIAGNOSIS — Z4932 Encounter for adequacy testing for peritoneal dialysis: Secondary | ICD-10-CM | POA: Diagnosis not present

## 2015-12-25 DIAGNOSIS — Z4932 Encounter for adequacy testing for peritoneal dialysis: Secondary | ICD-10-CM | POA: Diagnosis not present

## 2015-12-25 DIAGNOSIS — Z79899 Other long term (current) drug therapy: Secondary | ICD-10-CM | POA: Diagnosis not present

## 2015-12-25 DIAGNOSIS — R17 Unspecified jaundice: Secondary | ICD-10-CM | POA: Diagnosis not present

## 2015-12-25 DIAGNOSIS — D509 Iron deficiency anemia, unspecified: Secondary | ICD-10-CM | POA: Diagnosis not present

## 2015-12-25 DIAGNOSIS — N186 End stage renal disease: Secondary | ICD-10-CM | POA: Diagnosis not present

## 2015-12-25 DIAGNOSIS — D631 Anemia in chronic kidney disease: Secondary | ICD-10-CM | POA: Diagnosis not present

## 2015-12-26 DIAGNOSIS — R17 Unspecified jaundice: Secondary | ICD-10-CM | POA: Diagnosis not present

## 2015-12-26 DIAGNOSIS — N186 End stage renal disease: Secondary | ICD-10-CM | POA: Diagnosis not present

## 2015-12-26 DIAGNOSIS — D631 Anemia in chronic kidney disease: Secondary | ICD-10-CM | POA: Diagnosis not present

## 2015-12-26 DIAGNOSIS — Z4932 Encounter for adequacy testing for peritoneal dialysis: Secondary | ICD-10-CM | POA: Diagnosis not present

## 2015-12-26 DIAGNOSIS — D509 Iron deficiency anemia, unspecified: Secondary | ICD-10-CM | POA: Diagnosis not present

## 2015-12-26 DIAGNOSIS — Z79899 Other long term (current) drug therapy: Secondary | ICD-10-CM | POA: Diagnosis not present

## 2015-12-27 ENCOUNTER — Encounter: Payer: Self-pay | Admitting: Family Medicine

## 2015-12-27 ENCOUNTER — Ambulatory Visit (INDEPENDENT_AMBULATORY_CARE_PROVIDER_SITE_OTHER): Payer: Medicare Other | Admitting: Family Medicine

## 2015-12-27 VITALS — BP 122/79 | HR 95 | Temp 96.9°F | Ht 69.0 in | Wt 141.0 lb

## 2015-12-27 DIAGNOSIS — Z79899 Other long term (current) drug therapy: Secondary | ICD-10-CM | POA: Diagnosis not present

## 2015-12-27 DIAGNOSIS — Z992 Dependence on renal dialysis: Secondary | ICD-10-CM | POA: Diagnosis not present

## 2015-12-27 DIAGNOSIS — G2401 Drug induced subacute dyskinesia: Secondary | ICD-10-CM

## 2015-12-27 DIAGNOSIS — E034 Atrophy of thyroid (acquired): Secondary | ICD-10-CM | POA: Diagnosis not present

## 2015-12-27 DIAGNOSIS — N186 End stage renal disease: Secondary | ICD-10-CM

## 2015-12-27 DIAGNOSIS — Z4932 Encounter for adequacy testing for peritoneal dialysis: Secondary | ICD-10-CM | POA: Diagnosis not present

## 2015-12-27 DIAGNOSIS — F3170 Bipolar disorder, currently in remission, most recent episode unspecified: Secondary | ICD-10-CM | POA: Diagnosis not present

## 2015-12-27 DIAGNOSIS — D631 Anemia in chronic kidney disease: Secondary | ICD-10-CM

## 2015-12-27 DIAGNOSIS — T43505A Adverse effect of unspecified antipsychotics and neuroleptics, initial encounter: Secondary | ICD-10-CM | POA: Diagnosis not present

## 2015-12-27 DIAGNOSIS — D509 Iron deficiency anemia, unspecified: Secondary | ICD-10-CM | POA: Diagnosis not present

## 2015-12-27 DIAGNOSIS — R17 Unspecified jaundice: Secondary | ICD-10-CM | POA: Diagnosis not present
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
## 2015-12-28 DIAGNOSIS — N186 End stage renal disease: Secondary | ICD-10-CM | POA: Diagnosis not present

## 2015-12-28 DIAGNOSIS — D509 Iron deficiency anemia, unspecified: Secondary | ICD-10-CM | POA: Diagnosis not present

## 2015-12-28 DIAGNOSIS — D631 Anemia in chronic kidney disease: Secondary | ICD-10-CM | POA: Diagnosis not present

## 2015-12-28 DIAGNOSIS — Z79899 Other long term (current) drug therapy: Secondary | ICD-10-CM | POA: Diagnosis not present

## 2015-12-28 DIAGNOSIS — Z4932 Encounter for adequacy testing for peritoneal dialysis: Secondary | ICD-10-CM | POA: Diagnosis not present

## 2015-12-28 DIAGNOSIS — R17 Unspecified jaundice: Secondary | ICD-10-CM | POA: Diagnosis not present

## 2015-12-28 LAB — CMP14+EGFR
A/G RATIO: 1.5 (ref 1.2–2.2)
ALBUMIN: 3.6 g/dL (ref 3.6–4.8)
ALT: 9 IU/L (ref 0–44)
AST: 13 IU/L (ref 0–40)
Alkaline Phosphatase: 89 IU/L (ref 39–117)
BUN / CREAT RATIO: 5 — AB (ref 10–24)
BUN: 39 mg/dL — ABNORMAL HIGH (ref 8–27)
Bilirubin Total: 0.3 mg/dL (ref 0.0–1.2)
CALCIUM: 8.7 mg/dL (ref 8.6–10.2)
CO2: 27 mmol/L (ref 18–29)
Chloride: 87 mmol/L — ABNORMAL LOW (ref 96–106)
Creatinine, Ser: 8.49 mg/dL (ref 0.76–1.27)
GFR calc Af Amer: 7 mL/min/{1.73_m2} — ABNORMAL LOW (ref >59)
GFR, EST NON AFRICAN AMERICAN: 6 mL/min/{1.73_m2} — AB (ref >59)
GLOBULIN, TOTAL: 2.4 g/dL (ref 1.5–4.5)
Glucose: 90 mg/dL (ref 65–99)
POTASSIUM: 5.7 mmol/L — AB (ref 3.5–5.2)
SODIUM: 137 mmol/L (ref 134–144)
Total Protein: 6 g/dL (ref 6.0–8.5)

## 2015-12-28 LAB — CBC WITH DIFFERENTIAL/PLATELET
BASOS: 0 %
Basophils Absolute: 0 10*3/uL (ref 0.0–0.2)
EOS (ABSOLUTE): 0.2 10*3/uL (ref 0.0–0.4)
EOS: 2 %
HEMATOCRIT: 38.7 % (ref 37.5–51.0)
Hemoglobin: 12.5 g/dL — ABNORMAL LOW (ref 12.6–17.7)
Immature Grans (Abs): 0.1 10*3/uL (ref 0.0–0.1)
Immature Granulocytes: 1 %
LYMPHS ABS: 1.7 10*3/uL (ref 0.7–3.1)
Lymphs: 16 %
MCH: 31.1 pg (ref 26.6–33.0)
MCHC: 32.3 g/dL (ref 31.5–35.7)
MCV: 96 fL (ref 79–97)
MONOS ABS: 0.8 10*3/uL (ref 0.1–0.9)
Monocytes: 7 %
Neutrophils Absolute: 7.6 10*3/uL — ABNORMAL HIGH (ref 1.4–7.0)
Neutrophils: 74 %
Platelets: 238 10*3/uL (ref 150–379)
RBC: 4.02 x10E6/uL — ABNORMAL LOW (ref 4.14–5.80)
RDW: 18.2 % — AB (ref 12.3–15.4)
WBC: 10.4 10*3/uL (ref 3.4–10.8)

## 2015-12-28 LAB — TSH+FREE T4
FREE T4: 1.09 ng/dL (ref 0.82–1.77)
TSH: 3.84 u[IU]/mL (ref 0.450–4.500)

## 2015-12-29 DIAGNOSIS — I7789 Other specified disorders of arteries and arterioles: Secondary | ICD-10-CM | POA: Diagnosis not present

## 2015-12-29 DIAGNOSIS — D509 Iron deficiency anemia, unspecified: Secondary | ICD-10-CM | POA: Diagnosis not present

## 2015-12-29 DIAGNOSIS — Z4932 Encounter for adequacy testing for peritoneal dialysis: Secondary | ICD-10-CM | POA: Diagnosis not present

## 2015-12-29 DIAGNOSIS — Z992 Dependence on renal dialysis: Secondary | ICD-10-CM | POA: Diagnosis not present

## 2015-12-29 DIAGNOSIS — D631 Anemia in chronic kidney disease: Secondary | ICD-10-CM | POA: Diagnosis not present

## 2015-12-29 DIAGNOSIS — R17 Unspecified jaundice: Secondary | ICD-10-CM | POA: Diagnosis not present

## 2015-12-29 DIAGNOSIS — N186 End stage renal disease: Secondary | ICD-10-CM | POA: Diagnosis not present

## 2015-12-29 DIAGNOSIS — Z79899 Other long term (current) drug therapy: Secondary | ICD-10-CM | POA: Diagnosis not present

## 2015-12-30 DIAGNOSIS — N186 End stage renal disease: Secondary | ICD-10-CM | POA: Diagnosis not present

## 2015-12-30 DIAGNOSIS — D509 Iron deficiency anemia, unspecified: Secondary | ICD-10-CM | POA: Diagnosis not present

## 2015-12-30 DIAGNOSIS — D631 Anemia in chronic kidney disease: Secondary | ICD-10-CM | POA: Diagnosis not present

## 2015-12-30 DIAGNOSIS — Z4932 Encounter for adequacy testing for peritoneal dialysis: Secondary | ICD-10-CM | POA: Diagnosis not present

## 2015-12-30 DIAGNOSIS — N2589 Other disorders resulting from impaired renal tubular function: Secondary | ICD-10-CM | POA: Diagnosis not present

## 2015-12-30 DIAGNOSIS — K769 Liver disease, unspecified: Secondary | ICD-10-CM | POA: Diagnosis not present

## 2015-12-31 DIAGNOSIS — D631 Anemia in chronic kidney disease: Secondary | ICD-10-CM | POA: Diagnosis not present

## 2015-12-31 DIAGNOSIS — K769 Liver disease, unspecified: Secondary | ICD-10-CM | POA: Diagnosis not present

## 2015-12-31 DIAGNOSIS — N186 End stage renal disease: Secondary | ICD-10-CM | POA: Diagnosis not present

## 2015-12-31 DIAGNOSIS — N2589 Other disorders resulting from impaired renal tubular function: Secondary | ICD-10-CM | POA: Diagnosis not present

## 2015-12-31 DIAGNOSIS — D509 Iron deficiency anemia, unspecified: Secondary | ICD-10-CM | POA: Diagnosis not present

## 2015-12-31 DIAGNOSIS — Z4932 Encounter for adequacy testing for peritoneal dialysis: Secondary | ICD-10-CM | POA: Diagnosis not present

## 2016-01-01 DIAGNOSIS — Z4932 Encounter for adequacy testing for peritoneal dialysis: Secondary | ICD-10-CM | POA: Diagnosis not present

## 2016-01-01 DIAGNOSIS — N2589 Other disorders resulting from impaired renal tubular function: Secondary | ICD-10-CM | POA: Diagnosis not present

## 2016-01-01 DIAGNOSIS — N186 End stage renal disease: Secondary | ICD-10-CM | POA: Diagnosis not present

## 2016-01-01 DIAGNOSIS — K769 Liver disease, unspecified: Secondary | ICD-10-CM | POA: Diagnosis not present

## 2016-01-01 DIAGNOSIS — D631 Anemia in chronic kidney disease: Secondary | ICD-10-CM | POA: Diagnosis not present

## 2016-01-01 DIAGNOSIS — D509 Iron deficiency anemia, unspecified: Secondary | ICD-10-CM | POA: Diagnosis not present

## 2016-01-02 DIAGNOSIS — Z4932 Encounter for adequacy testing for peritoneal dialysis: Secondary | ICD-10-CM | POA: Diagnosis not present

## 2016-01-02 DIAGNOSIS — D509 Iron deficiency anemia, unspecified: Secondary | ICD-10-CM | POA: Diagnosis not present

## 2016-01-02 DIAGNOSIS — N186 End stage renal disease: Secondary | ICD-10-CM | POA: Diagnosis not present

## 2016-01-02 DIAGNOSIS — D631 Anemia in chronic kidney disease: Secondary | ICD-10-CM | POA: Diagnosis not present

## 2016-01-02 DIAGNOSIS — R8299 Other abnormal findings in urine: Secondary | ICD-10-CM | POA: Diagnosis not present

## 2016-01-02 DIAGNOSIS — K769 Liver disease, unspecified: Secondary | ICD-10-CM | POA: Diagnosis not present

## 2016-01-02 DIAGNOSIS — N2589 Other disorders resulting from impaired renal tubular function: Secondary | ICD-10-CM | POA: Diagnosis not present

## 2016-01-03 DIAGNOSIS — D631 Anemia in chronic kidney disease: Secondary | ICD-10-CM | POA: Diagnosis not present

## 2016-01-03 DIAGNOSIS — K769 Liver disease, unspecified: Secondary | ICD-10-CM | POA: Diagnosis not present

## 2016-01-03 DIAGNOSIS — D509 Iron deficiency anemia, unspecified: Secondary | ICD-10-CM | POA: Diagnosis not present

## 2016-01-03 DIAGNOSIS — N186 End stage renal disease: Secondary | ICD-10-CM | POA: Diagnosis not present

## 2016-01-03 DIAGNOSIS — N2589 Other disorders resulting from impaired renal tubular function: Secondary | ICD-10-CM | POA: Diagnosis not present

## 2016-01-03 DIAGNOSIS — Z4932 Encounter for adequacy testing for peritoneal dialysis: Secondary | ICD-10-CM | POA: Diagnosis not present

## 2016-01-04 DIAGNOSIS — N2589 Other disorders resulting from impaired renal tubular function: Secondary | ICD-10-CM | POA: Diagnosis not present

## 2016-01-04 DIAGNOSIS — K769 Liver disease, unspecified: Secondary | ICD-10-CM | POA: Diagnosis not present

## 2016-01-04 DIAGNOSIS — Z4932 Encounter for adequacy testing for peritoneal dialysis: Secondary | ICD-10-CM | POA: Diagnosis not present

## 2016-01-04 DIAGNOSIS — D509 Iron deficiency anemia, unspecified: Secondary | ICD-10-CM | POA: Diagnosis not present

## 2016-01-04 DIAGNOSIS — D631 Anemia in chronic kidney disease: Secondary | ICD-10-CM | POA: Diagnosis not present

## 2016-01-04 DIAGNOSIS — N186 End stage renal disease: Secondary | ICD-10-CM | POA: Diagnosis not present

## 2016-01-05 DIAGNOSIS — N2589 Other disorders resulting from impaired renal tubular function: Secondary | ICD-10-CM | POA: Diagnosis not present

## 2016-01-05 DIAGNOSIS — K769 Liver disease, unspecified: Secondary | ICD-10-CM | POA: Diagnosis not present

## 2016-01-05 DIAGNOSIS — Z4932 Encounter for adequacy testing for peritoneal dialysis: Secondary | ICD-10-CM | POA: Diagnosis not present

## 2016-01-05 DIAGNOSIS — D631 Anemia in chronic kidney disease: Secondary | ICD-10-CM | POA: Diagnosis not present

## 2016-01-05 DIAGNOSIS — N186 End stage renal disease: Secondary | ICD-10-CM | POA: Diagnosis not present

## 2016-01-05 DIAGNOSIS — D509 Iron deficiency anemia, unspecified: Secondary | ICD-10-CM | POA: Diagnosis not present

## 2016-01-06 DIAGNOSIS — K769 Liver disease, unspecified: Secondary | ICD-10-CM | POA: Diagnosis not present

## 2016-01-06 DIAGNOSIS — Z4932 Encounter for adequacy testing for peritoneal dialysis: Secondary | ICD-10-CM | POA: Diagnosis not present

## 2016-01-06 DIAGNOSIS — D509 Iron deficiency anemia, unspecified: Secondary | ICD-10-CM | POA: Diagnosis not present

## 2016-01-06 DIAGNOSIS — N2589 Other disorders resulting from impaired renal tubular function: Secondary | ICD-10-CM | POA: Diagnosis not present

## 2016-01-06 DIAGNOSIS — N186 End stage renal disease: Secondary | ICD-10-CM | POA: Diagnosis not present

## 2016-01-06 DIAGNOSIS — D631 Anemia in chronic kidney disease: Secondary | ICD-10-CM | POA: Diagnosis not present

## 2016-01-07 DIAGNOSIS — N2589 Other disorders resulting from impaired renal tubular function: Secondary | ICD-10-CM | POA: Diagnosis not present

## 2016-01-07 DIAGNOSIS — D509 Iron deficiency anemia, unspecified: Secondary | ICD-10-CM | POA: Diagnosis not present

## 2016-01-07 DIAGNOSIS — Z4932 Encounter for adequacy testing for peritoneal dialysis: Secondary | ICD-10-CM | POA: Diagnosis not present

## 2016-01-07 DIAGNOSIS — N186 End stage renal disease: Secondary | ICD-10-CM | POA: Diagnosis not present

## 2016-01-07 DIAGNOSIS — K769 Liver disease, unspecified: Secondary | ICD-10-CM | POA: Diagnosis not present

## 2016-01-07 DIAGNOSIS — D631 Anemia in chronic kidney disease: Secondary | ICD-10-CM | POA: Diagnosis not present

## 2016-01-08 DIAGNOSIS — K769 Liver disease, unspecified: Secondary | ICD-10-CM | POA: Diagnosis not present

## 2016-01-08 DIAGNOSIS — N186 End stage renal disease: Secondary | ICD-10-CM | POA: Diagnosis not present

## 2016-01-08 DIAGNOSIS — N2589 Other disorders resulting from impaired renal tubular function: Secondary | ICD-10-CM | POA: Diagnosis not present

## 2016-01-08 DIAGNOSIS — D509 Iron deficiency anemia, unspecified: Secondary | ICD-10-CM | POA: Diagnosis not present

## 2016-01-08 DIAGNOSIS — D631 Anemia in chronic kidney disease: Secondary | ICD-10-CM | POA: Diagnosis not present

## 2016-01-08 DIAGNOSIS — Z4932 Encounter for adequacy testing for peritoneal dialysis: Secondary | ICD-10-CM | POA: Diagnosis not present

## 2016-01-09 DIAGNOSIS — N186 End stage renal disease: Secondary | ICD-10-CM | POA: Diagnosis not present

## 2016-01-09 DIAGNOSIS — N2589 Other disorders resulting from impaired renal tubular function: Secondary | ICD-10-CM | POA: Diagnosis not present

## 2016-01-09 DIAGNOSIS — Z4932 Encounter for adequacy testing for peritoneal dialysis: Secondary | ICD-10-CM | POA: Diagnosis not present

## 2016-01-09 DIAGNOSIS — D631 Anemia in chronic kidney disease: Secondary | ICD-10-CM | POA: Diagnosis not present

## 2016-01-09 DIAGNOSIS — D509 Iron deficiency anemia, unspecified: Secondary | ICD-10-CM | POA: Diagnosis not present

## 2016-01-09 DIAGNOSIS — K769 Liver disease, unspecified: Secondary | ICD-10-CM | POA: Diagnosis not present

## 2016-01-10 DIAGNOSIS — D509 Iron deficiency anemia, unspecified: Secondary | ICD-10-CM | POA: Diagnosis not present

## 2016-01-10 DIAGNOSIS — N186 End stage renal disease: Secondary | ICD-10-CM | POA: Diagnosis not present

## 2016-01-10 DIAGNOSIS — N2589 Other disorders resulting from impaired renal tubular function: Secondary | ICD-10-CM | POA: Diagnosis not present

## 2016-01-10 DIAGNOSIS — Z4932 Encounter for adequacy testing for peritoneal dialysis: Secondary | ICD-10-CM | POA: Diagnosis not present

## 2016-01-10 DIAGNOSIS — D631 Anemia in chronic kidney disease: Secondary | ICD-10-CM | POA: Diagnosis not present

## 2016-01-10 DIAGNOSIS — K769 Liver disease, unspecified: Secondary | ICD-10-CM | POA: Diagnosis not present

## 2016-01-11 DIAGNOSIS — Z4932 Encounter for adequacy testing for peritoneal dialysis: Secondary | ICD-10-CM | POA: Diagnosis not present

## 2016-01-11 DIAGNOSIS — N186 End stage renal disease: Secondary | ICD-10-CM | POA: Diagnosis not present

## 2016-01-11 DIAGNOSIS — N2589 Other disorders resulting from impaired renal tubular function: Secondary | ICD-10-CM | POA: Diagnosis not present

## 2016-01-11 DIAGNOSIS — K769 Liver disease, unspecified: Secondary | ICD-10-CM | POA: Diagnosis not present

## 2016-01-11 DIAGNOSIS — D509 Iron deficiency anemia, unspecified: Secondary | ICD-10-CM | POA: Diagnosis not present

## 2016-01-11 DIAGNOSIS — D631 Anemia in chronic kidney disease: Secondary | ICD-10-CM | POA: Diagnosis not present

## 2016-01-12 DIAGNOSIS — N186 End stage renal disease: Secondary | ICD-10-CM | POA: Diagnosis not present

## 2016-01-12 DIAGNOSIS — N2589 Other disorders resulting from impaired renal tubular function: Secondary | ICD-10-CM | POA: Diagnosis not present

## 2016-01-12 DIAGNOSIS — K769 Liver disease, unspecified: Secondary | ICD-10-CM | POA: Diagnosis not present

## 2016-01-12 DIAGNOSIS — Z4932 Encounter for adequacy testing for peritoneal dialysis: Secondary | ICD-10-CM | POA: Diagnosis not present

## 2016-01-12 DIAGNOSIS — D631 Anemia in chronic kidney disease: Secondary | ICD-10-CM | POA: Diagnosis not present

## 2016-01-12 DIAGNOSIS — D509 Iron deficiency anemia, unspecified: Secondary | ICD-10-CM | POA: Diagnosis not present

## 2016-01-13 DIAGNOSIS — Z4932 Encounter for adequacy testing for peritoneal dialysis: Secondary | ICD-10-CM | POA: Diagnosis not present

## 2016-01-13 DIAGNOSIS — N2589 Other disorders resulting from impaired renal tubular function: Secondary | ICD-10-CM | POA: Diagnosis not present

## 2016-01-13 DIAGNOSIS — D509 Iron deficiency anemia, unspecified: Secondary | ICD-10-CM | POA: Diagnosis not present

## 2016-01-13 DIAGNOSIS — N186 End stage renal disease: Secondary | ICD-10-CM | POA: Diagnosis not present

## 2016-01-13 DIAGNOSIS — K769 Liver disease, unspecified: Secondary | ICD-10-CM | POA: Diagnosis not present

## 2016-01-13 DIAGNOSIS — D631 Anemia in chronic kidney disease: Secondary | ICD-10-CM | POA: Diagnosis not present

## 2016-01-14 DIAGNOSIS — Z4932 Encounter for adequacy testing for peritoneal dialysis: Secondary | ICD-10-CM | POA: Diagnosis not present

## 2016-01-14 DIAGNOSIS — N2589 Other disorders resulting from impaired renal tubular function: Secondary | ICD-10-CM | POA: Diagnosis not present

## 2016-01-14 DIAGNOSIS — K769 Liver disease, unspecified: Secondary | ICD-10-CM | POA: Diagnosis not present

## 2016-01-14 DIAGNOSIS — D509 Iron deficiency anemia, unspecified: Secondary | ICD-10-CM | POA: Diagnosis not present

## 2016-01-14 DIAGNOSIS — D631 Anemia in chronic kidney disease: Secondary | ICD-10-CM | POA: Diagnosis not present

## 2016-01-14 DIAGNOSIS — N186 End stage renal disease: Secondary | ICD-10-CM | POA: Diagnosis not present

## 2016-01-15 DIAGNOSIS — N186 End stage renal disease: Secondary | ICD-10-CM | POA: Diagnosis not present

## 2016-01-15 DIAGNOSIS — D509 Iron deficiency anemia, unspecified: Secondary | ICD-10-CM | POA: Diagnosis not present

## 2016-01-15 DIAGNOSIS — N2589 Other disorders resulting from impaired renal tubular function: Secondary | ICD-10-CM | POA: Diagnosis not present

## 2016-01-15 DIAGNOSIS — D631 Anemia in chronic kidney disease: Secondary | ICD-10-CM | POA: Diagnosis not present

## 2016-01-15 DIAGNOSIS — K769 Liver disease, unspecified: Secondary | ICD-10-CM | POA: Diagnosis not present

## 2016-01-15 DIAGNOSIS — Z4932 Encounter for adequacy testing for peritoneal dialysis: Secondary | ICD-10-CM | POA: Diagnosis not present

## 2016-01-16 ENCOUNTER — Ambulatory Visit (INDEPENDENT_AMBULATORY_CARE_PROVIDER_SITE_OTHER): Payer: Medicare Other | Admitting: Family Medicine

## 2016-01-16 ENCOUNTER — Encounter: Payer: Self-pay | Admitting: Family Medicine

## 2016-01-16 VITALS — BP 121/76 | HR 95 | Ht 69.0 in | Wt 136.5 lb

## 2016-01-16 DIAGNOSIS — D631 Anemia in chronic kidney disease: Secondary | ICD-10-CM | POA: Diagnosis not present

## 2016-01-16 DIAGNOSIS — D509 Iron deficiency anemia, unspecified: Secondary | ICD-10-CM | POA: Diagnosis not present

## 2016-01-16 DIAGNOSIS — H6123 Impacted cerumen, bilateral: Secondary | ICD-10-CM | POA: Diagnosis not present

## 2016-01-16 DIAGNOSIS — K769 Liver disease, unspecified: Secondary | ICD-10-CM | POA: Diagnosis not present

## 2016-01-16 DIAGNOSIS — N186 End stage renal disease: Secondary | ICD-10-CM | POA: Diagnosis not present

## 2016-01-16 DIAGNOSIS — N2589 Other disorders resulting from impaired renal tubular function: Secondary | ICD-10-CM | POA: Diagnosis not present

## 2016-01-16 DIAGNOSIS — Z4932 Encounter for adequacy testing for peritoneal dialysis: Secondary | ICD-10-CM | POA: Diagnosis not present
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
## 2016-01-17 DIAGNOSIS — N2589 Other disorders resulting from impaired renal tubular function: Secondary | ICD-10-CM | POA: Diagnosis not present

## 2016-01-17 DIAGNOSIS — D631 Anemia in chronic kidney disease: Secondary | ICD-10-CM | POA: Diagnosis not present

## 2016-01-17 DIAGNOSIS — K769 Liver disease, unspecified: Secondary | ICD-10-CM | POA: Diagnosis not present

## 2016-01-17 DIAGNOSIS — N186 End stage renal disease: Secondary | ICD-10-CM | POA: Diagnosis not present

## 2016-01-17 DIAGNOSIS — D509 Iron deficiency anemia, unspecified: Secondary | ICD-10-CM | POA: Diagnosis not present

## 2016-01-17 DIAGNOSIS — Z4932 Encounter for adequacy testing for peritoneal dialysis: Secondary | ICD-10-CM | POA: Diagnosis not present

## 2016-01-18 DIAGNOSIS — Z4932 Encounter for adequacy testing for peritoneal dialysis: Secondary | ICD-10-CM | POA: Diagnosis not present

## 2016-01-18 DIAGNOSIS — D509 Iron deficiency anemia, unspecified: Secondary | ICD-10-CM | POA: Diagnosis not present

## 2016-01-18 DIAGNOSIS — K769 Liver disease, unspecified: Secondary | ICD-10-CM | POA: Diagnosis not present

## 2016-01-18 DIAGNOSIS — N2589 Other disorders resulting from impaired renal tubular function: Secondary | ICD-10-CM | POA: Diagnosis not present

## 2016-01-18 DIAGNOSIS — N186 End stage renal disease: Secondary | ICD-10-CM | POA: Diagnosis not present

## 2016-01-18 DIAGNOSIS — D631 Anemia in chronic kidney disease: Secondary | ICD-10-CM | POA: Diagnosis not present

## 2016-01-19 DIAGNOSIS — N186 End stage renal disease: Secondary | ICD-10-CM | POA: Diagnosis not present

## 2016-01-19 DIAGNOSIS — H6123 Impacted cerumen, bilateral: Secondary | ICD-10-CM | POA: Diagnosis not present

## 2016-01-19 DIAGNOSIS — D631 Anemia in chronic kidney disease: Secondary | ICD-10-CM | POA: Diagnosis not present

## 2016-01-19 DIAGNOSIS — D509 Iron deficiency anemia, unspecified: Secondary | ICD-10-CM | POA: Diagnosis not present

## 2016-01-19 DIAGNOSIS — Z4932 Encounter for adequacy testing for peritoneal dialysis: Secondary | ICD-10-CM | POA: Diagnosis not present

## 2016-01-19 DIAGNOSIS — K769 Liver disease, unspecified: Secondary | ICD-10-CM | POA: Diagnosis not present

## 2016-01-19 DIAGNOSIS — N2589 Other disorders resulting from impaired renal tubular function: Secondary | ICD-10-CM | POA: Diagnosis not present

## 2016-01-20 DIAGNOSIS — D509 Iron deficiency anemia, unspecified: Secondary | ICD-10-CM | POA: Diagnosis not present

## 2016-01-20 DIAGNOSIS — K769 Liver disease, unspecified: Secondary | ICD-10-CM | POA: Diagnosis not present

## 2016-01-20 DIAGNOSIS — N2589 Other disorders resulting from impaired renal tubular function: Secondary | ICD-10-CM | POA: Diagnosis not present

## 2016-01-20 DIAGNOSIS — D631 Anemia in chronic kidney disease: Secondary | ICD-10-CM | POA: Diagnosis not present

## 2016-01-20 DIAGNOSIS — N186 End stage renal disease: Secondary | ICD-10-CM | POA: Diagnosis not present

## 2016-01-20 DIAGNOSIS — Z4932 Encounter for adequacy testing for peritoneal dialysis: Secondary | ICD-10-CM | POA: Diagnosis not present

## 2016-01-21 DIAGNOSIS — N2589 Other disorders resulting from impaired renal tubular function: Secondary | ICD-10-CM | POA: Diagnosis not present

## 2016-01-21 DIAGNOSIS — Z4932 Encounter for adequacy testing for peritoneal dialysis: Secondary | ICD-10-CM | POA: Diagnosis not present

## 2016-01-21 DIAGNOSIS — D631 Anemia in chronic kidney disease: Secondary | ICD-10-CM | POA: Diagnosis not present

## 2016-01-21 DIAGNOSIS — D509 Iron deficiency anemia, unspecified: Secondary | ICD-10-CM | POA: Diagnosis not present

## 2016-01-21 DIAGNOSIS — N186 End stage renal disease: Secondary | ICD-10-CM | POA: Diagnosis not present

## 2016-01-21 DIAGNOSIS — K769 Liver disease, unspecified: Secondary | ICD-10-CM | POA: Diagnosis not present

## 2016-01-22 DIAGNOSIS — D631 Anemia in chronic kidney disease: Secondary | ICD-10-CM | POA: Diagnosis not present

## 2016-01-22 DIAGNOSIS — Z4932 Encounter for adequacy testing for peritoneal dialysis: Secondary | ICD-10-CM | POA: Diagnosis not present

## 2016-01-22 DIAGNOSIS — K769 Liver disease, unspecified: Secondary | ICD-10-CM | POA: Diagnosis not present

## 2016-01-22 DIAGNOSIS — N186 End stage renal disease: Secondary | ICD-10-CM | POA: Diagnosis not present

## 2016-01-22 DIAGNOSIS — D509 Iron deficiency anemia, unspecified: Secondary | ICD-10-CM | POA: Diagnosis not present

## 2016-01-22 DIAGNOSIS — N2589 Other disorders resulting from impaired renal tubular function: Secondary | ICD-10-CM | POA: Diagnosis not present

## 2016-01-23 DIAGNOSIS — D509 Iron deficiency anemia, unspecified: Secondary | ICD-10-CM | POA: Diagnosis not present

## 2016-01-23 DIAGNOSIS — N2589 Other disorders resulting from impaired renal tubular function: Secondary | ICD-10-CM | POA: Diagnosis not present

## 2016-01-23 DIAGNOSIS — Z4932 Encounter for adequacy testing for peritoneal dialysis: Secondary | ICD-10-CM | POA: Diagnosis not present

## 2016-01-23 DIAGNOSIS — K769 Liver disease, unspecified: Secondary | ICD-10-CM | POA: Diagnosis not present

## 2016-01-23 DIAGNOSIS — D631 Anemia in chronic kidney disease: Secondary | ICD-10-CM | POA: Diagnosis not present

## 2016-01-23 DIAGNOSIS — N186 End stage renal disease: Secondary | ICD-10-CM | POA: Diagnosis not present

## 2016-01-24 DIAGNOSIS — K769 Liver disease, unspecified: Secondary | ICD-10-CM | POA: Diagnosis not present

## 2016-01-24 DIAGNOSIS — N186 End stage renal disease: Secondary | ICD-10-CM | POA: Diagnosis not present

## 2016-01-24 DIAGNOSIS — Z4932 Encounter for adequacy testing for peritoneal dialysis: Secondary | ICD-10-CM | POA: Diagnosis not present

## 2016-01-24 DIAGNOSIS — D631 Anemia in chronic kidney disease: Secondary | ICD-10-CM | POA: Diagnosis not present

## 2016-01-24 DIAGNOSIS — D509 Iron deficiency anemia, unspecified: Secondary | ICD-10-CM | POA: Diagnosis not present

## 2016-01-24 DIAGNOSIS — N2589 Other disorders resulting from impaired renal tubular function: Secondary | ICD-10-CM | POA: Diagnosis not present

## 2016-01-25 DIAGNOSIS — K769 Liver disease, unspecified: Secondary | ICD-10-CM | POA: Diagnosis not present

## 2016-01-25 DIAGNOSIS — D509 Iron deficiency anemia, unspecified: Secondary | ICD-10-CM | POA: Diagnosis not present

## 2016-01-25 DIAGNOSIS — N2589 Other disorders resulting from impaired renal tubular function: Secondary | ICD-10-CM | POA: Diagnosis not present

## 2016-01-25 DIAGNOSIS — D631 Anemia in chronic kidney disease: Secondary | ICD-10-CM | POA: Diagnosis not present

## 2016-01-25 DIAGNOSIS — N186 End stage renal disease: Secondary | ICD-10-CM | POA: Diagnosis not present

## 2016-01-25 DIAGNOSIS — Z4932 Encounter for adequacy testing for peritoneal dialysis: Secondary | ICD-10-CM | POA: Diagnosis not present

## 2016-01-26 DIAGNOSIS — Z4932 Encounter for adequacy testing for peritoneal dialysis: Secondary | ICD-10-CM | POA: Diagnosis not present

## 2016-01-26 DIAGNOSIS — K769 Liver disease, unspecified: Secondary | ICD-10-CM | POA: Diagnosis not present

## 2016-01-26 DIAGNOSIS — N2589 Other disorders resulting from impaired renal tubular function: Secondary | ICD-10-CM | POA: Diagnosis not present

## 2016-01-26 DIAGNOSIS — D509 Iron deficiency anemia, unspecified: Secondary | ICD-10-CM | POA: Diagnosis not present

## 2016-01-26 DIAGNOSIS — N186 End stage renal disease: Secondary | ICD-10-CM | POA: Diagnosis not present

## 2016-01-26 DIAGNOSIS — D631 Anemia in chronic kidney disease: Secondary | ICD-10-CM | POA: Diagnosis not present

## 2016-01-27 DIAGNOSIS — K769 Liver disease, unspecified: Secondary | ICD-10-CM | POA: Diagnosis not present

## 2016-01-27 DIAGNOSIS — N186 End stage renal disease: Secondary | ICD-10-CM | POA: Diagnosis not present

## 2016-01-27 DIAGNOSIS — N2589 Other disorders resulting from impaired renal tubular function: Secondary | ICD-10-CM | POA: Diagnosis not present

## 2016-01-27 DIAGNOSIS — D509 Iron deficiency anemia, unspecified: Secondary | ICD-10-CM | POA: Diagnosis not present

## 2016-01-27 DIAGNOSIS — Z4932 Encounter for adequacy testing for peritoneal dialysis: Secondary | ICD-10-CM | POA: Diagnosis not present

## 2016-01-27 DIAGNOSIS — D631 Anemia in chronic kidney disease: Secondary | ICD-10-CM | POA: Diagnosis not present

## 2016-01-28 DIAGNOSIS — Z4932 Encounter for adequacy testing for peritoneal dialysis: Secondary | ICD-10-CM | POA: Diagnosis not present

## 2016-01-28 DIAGNOSIS — D631 Anemia in chronic kidney disease: Secondary | ICD-10-CM | POA: Diagnosis not present

## 2016-01-28 DIAGNOSIS — K769 Liver disease, unspecified: Secondary | ICD-10-CM | POA: Diagnosis not present

## 2016-01-28 DIAGNOSIS — N2589 Other disorders resulting from impaired renal tubular function: Secondary | ICD-10-CM | POA: Diagnosis not present

## 2016-01-28 DIAGNOSIS — N186 End stage renal disease: Secondary | ICD-10-CM | POA: Diagnosis not present

## 2016-01-28 DIAGNOSIS — D509 Iron deficiency anemia, unspecified: Secondary | ICD-10-CM | POA: Diagnosis not present

## 2016-01-29 DIAGNOSIS — Z992 Dependence on renal dialysis: Secondary | ICD-10-CM | POA: Diagnosis not present

## 2016-01-29 DIAGNOSIS — I7789 Other specified disorders of arteries and arterioles: Secondary | ICD-10-CM | POA: Diagnosis not present

## 2016-01-29 DIAGNOSIS — D631 Anemia in chronic kidney disease: Secondary | ICD-10-CM | POA: Diagnosis not present

## 2016-01-29 DIAGNOSIS — K769 Liver disease, unspecified: Secondary | ICD-10-CM | POA: Diagnosis not present

## 2016-01-29 DIAGNOSIS — D509 Iron deficiency anemia, unspecified: Secondary | ICD-10-CM | POA: Diagnosis not present

## 2016-01-29 DIAGNOSIS — N2589 Other disorders resulting from impaired renal tubular function: Secondary | ICD-10-CM | POA: Diagnosis not present

## 2016-01-29 DIAGNOSIS — N186 End stage renal disease: Secondary | ICD-10-CM | POA: Diagnosis not present

## 2016-01-29 DIAGNOSIS — Z4932 Encounter for adequacy testing for peritoneal dialysis: Secondary | ICD-10-CM | POA: Diagnosis not present

## 2016-01-30 DIAGNOSIS — D631 Anemia in chronic kidney disease: Secondary | ICD-10-CM | POA: Diagnosis not present

## 2016-01-30 DIAGNOSIS — D630 Anemia in neoplastic disease: Secondary | ICD-10-CM | POA: Diagnosis not present

## 2016-01-30 DIAGNOSIS — N186 End stage renal disease: Secondary | ICD-10-CM | POA: Diagnosis not present

## 2016-01-30 DIAGNOSIS — N2589 Other disorders resulting from impaired renal tubular function: Secondary | ICD-10-CM | POA: Diagnosis not present

## 2016-01-30 DIAGNOSIS — N2581 Secondary hyperparathyroidism of renal origin: Secondary | ICD-10-CM | POA: Diagnosis not present

## 2016-01-30 DIAGNOSIS — K769 Liver disease, unspecified: Secondary | ICD-10-CM | POA: Diagnosis not present

## 2016-01-30 DIAGNOSIS — Z4932 Encounter for adequacy testing for peritoneal dialysis: Secondary | ICD-10-CM | POA: Diagnosis not present

## 2016-01-30 DIAGNOSIS — D509 Iron deficiency anemia, unspecified: Secondary | ICD-10-CM | POA: Diagnosis not present

## 2016-01-31 DIAGNOSIS — N2589 Other disorders resulting from impaired renal tubular function: Secondary | ICD-10-CM | POA: Diagnosis not present

## 2016-01-31 DIAGNOSIS — D630 Anemia in neoplastic disease: Secondary | ICD-10-CM | POA: Diagnosis not present

## 2016-01-31 DIAGNOSIS — D509 Iron deficiency anemia, unspecified: Secondary | ICD-10-CM | POA: Diagnosis not present

## 2016-01-31 DIAGNOSIS — N186 End stage renal disease: Secondary | ICD-10-CM | POA: Diagnosis not present

## 2016-01-31 DIAGNOSIS — D631 Anemia in chronic kidney disease: Secondary | ICD-10-CM | POA: Diagnosis not present

## 2016-01-31 DIAGNOSIS — K769 Liver disease, unspecified: Secondary | ICD-10-CM | POA: Diagnosis not present

## 2016-02-01 DIAGNOSIS — D509 Iron deficiency anemia, unspecified: Secondary | ICD-10-CM | POA: Diagnosis not present

## 2016-02-01 DIAGNOSIS — N2589 Other disorders resulting from impaired renal tubular function: Secondary | ICD-10-CM | POA: Diagnosis not present

## 2016-02-01 DIAGNOSIS — D631 Anemia in chronic kidney disease: Secondary | ICD-10-CM | POA: Diagnosis not present

## 2016-02-01 DIAGNOSIS — K769 Liver disease, unspecified: Secondary | ICD-10-CM | POA: Diagnosis not present

## 2016-02-01 DIAGNOSIS — D630 Anemia in neoplastic disease: Secondary | ICD-10-CM | POA: Diagnosis not present

## 2016-02-01 DIAGNOSIS — N186 End stage renal disease: Secondary | ICD-10-CM | POA: Diagnosis not present

## 2016-02-02 DIAGNOSIS — N186 End stage renal disease: Secondary | ICD-10-CM | POA: Diagnosis not present

## 2016-02-02 DIAGNOSIS — N2589 Other disorders resulting from impaired renal tubular function: Secondary | ICD-10-CM | POA: Diagnosis not present

## 2016-02-02 DIAGNOSIS — D630 Anemia in neoplastic disease: Secondary | ICD-10-CM | POA: Diagnosis not present

## 2016-02-02 DIAGNOSIS — R8299 Other abnormal findings in urine: Secondary | ICD-10-CM | POA: Diagnosis not present

## 2016-02-02 DIAGNOSIS — D631 Anemia in chronic kidney disease: Secondary | ICD-10-CM | POA: Diagnosis not present

## 2016-02-02 DIAGNOSIS — E784 Other hyperlipidemia: Secondary | ICD-10-CM | POA: Diagnosis not present

## 2016-02-02 DIAGNOSIS — D509 Iron deficiency anemia, unspecified: Secondary | ICD-10-CM | POA: Diagnosis not present

## 2016-02-02 DIAGNOSIS — K769 Liver disease, unspecified: Secondary | ICD-10-CM | POA: Diagnosis not present

## 2016-02-03 DIAGNOSIS — D631 Anemia in chronic kidney disease: Secondary | ICD-10-CM | POA: Diagnosis not present

## 2016-02-03 DIAGNOSIS — N2589 Other disorders resulting from impaired renal tubular function: Secondary | ICD-10-CM | POA: Diagnosis not present

## 2016-02-03 DIAGNOSIS — D509 Iron deficiency anemia, unspecified: Secondary | ICD-10-CM | POA: Diagnosis not present

## 2016-02-03 DIAGNOSIS — N186 End stage renal disease: Secondary | ICD-10-CM | POA: Diagnosis not present

## 2016-02-03 DIAGNOSIS — D630 Anemia in neoplastic disease: Secondary | ICD-10-CM | POA: Diagnosis not present

## 2016-02-03 DIAGNOSIS — K769 Liver disease, unspecified: Secondary | ICD-10-CM | POA: Diagnosis not present

## 2016-02-04 DIAGNOSIS — D509 Iron deficiency anemia, unspecified: Secondary | ICD-10-CM | POA: Diagnosis not present

## 2016-02-04 DIAGNOSIS — D630 Anemia in neoplastic disease: Secondary | ICD-10-CM | POA: Diagnosis not present

## 2016-02-04 DIAGNOSIS — N186 End stage renal disease: Secondary | ICD-10-CM | POA: Diagnosis not present

## 2016-02-04 DIAGNOSIS — N2589 Other disorders resulting from impaired renal tubular function: Secondary | ICD-10-CM | POA: Diagnosis not present

## 2016-02-04 DIAGNOSIS — K769 Liver disease, unspecified: Secondary | ICD-10-CM | POA: Diagnosis not present

## 2016-02-04 DIAGNOSIS — D631 Anemia in chronic kidney disease: Secondary | ICD-10-CM | POA: Diagnosis not present

## 2016-02-05 DIAGNOSIS — K769 Liver disease, unspecified: Secondary | ICD-10-CM | POA: Diagnosis not present

## 2016-02-05 DIAGNOSIS — D631 Anemia in chronic kidney disease: Secondary | ICD-10-CM | POA: Diagnosis not present

## 2016-02-05 DIAGNOSIS — D509 Iron deficiency anemia, unspecified: Secondary | ICD-10-CM | POA: Diagnosis not present

## 2016-02-05 DIAGNOSIS — N2589 Other disorders resulting from impaired renal tubular function: Secondary | ICD-10-CM | POA: Diagnosis not present

## 2016-02-05 DIAGNOSIS — D630 Anemia in neoplastic disease: Secondary | ICD-10-CM | POA: Diagnosis not present

## 2016-02-05 DIAGNOSIS — N186 End stage renal disease: Secondary | ICD-10-CM | POA: Diagnosis not present

## 2016-02-06 DIAGNOSIS — N2589 Other disorders resulting from impaired renal tubular function: Secondary | ICD-10-CM | POA: Diagnosis not present

## 2016-02-06 DIAGNOSIS — N186 End stage renal disease: Secondary | ICD-10-CM | POA: Diagnosis not present

## 2016-02-06 DIAGNOSIS — K769 Liver disease, unspecified: Secondary | ICD-10-CM | POA: Diagnosis not present

## 2016-02-06 DIAGNOSIS — D631 Anemia in chronic kidney disease: Secondary | ICD-10-CM | POA: Diagnosis not present

## 2016-02-06 DIAGNOSIS — D630 Anemia in neoplastic disease: Secondary | ICD-10-CM | POA: Diagnosis not present

## 2016-02-06 DIAGNOSIS — D509 Iron deficiency anemia, unspecified: Secondary | ICD-10-CM | POA: Diagnosis not present

## 2016-02-07 DIAGNOSIS — D630 Anemia in neoplastic disease: Secondary | ICD-10-CM | POA: Diagnosis not present

## 2016-02-07 DIAGNOSIS — D631 Anemia in chronic kidney disease: Secondary | ICD-10-CM | POA: Diagnosis not present

## 2016-02-07 DIAGNOSIS — N2589 Other disorders resulting from impaired renal tubular function: Secondary | ICD-10-CM | POA: Diagnosis not present

## 2016-02-07 DIAGNOSIS — D509 Iron deficiency anemia, unspecified: Secondary | ICD-10-CM | POA: Diagnosis not present

## 2016-02-07 DIAGNOSIS — N186 End stage renal disease: Secondary | ICD-10-CM | POA: Diagnosis not present

## 2016-02-07 DIAGNOSIS — K769 Liver disease, unspecified: Secondary | ICD-10-CM | POA: Diagnosis not present

## 2016-02-08 DIAGNOSIS — N186 End stage renal disease: Secondary | ICD-10-CM | POA: Diagnosis not present

## 2016-02-08 DIAGNOSIS — D509 Iron deficiency anemia, unspecified: Secondary | ICD-10-CM | POA: Diagnosis not present

## 2016-02-08 DIAGNOSIS — D631 Anemia in chronic kidney disease: Secondary | ICD-10-CM | POA: Diagnosis not present

## 2016-02-08 DIAGNOSIS — K769 Liver disease, unspecified: Secondary | ICD-10-CM | POA: Diagnosis not present

## 2016-02-08 DIAGNOSIS — N2589 Other disorders resulting from impaired renal tubular function: Secondary | ICD-10-CM | POA: Diagnosis not present

## 2016-02-08 DIAGNOSIS — D630 Anemia in neoplastic disease: Secondary | ICD-10-CM | POA: Diagnosis not present

## 2016-02-09 DIAGNOSIS — D509 Iron deficiency anemia, unspecified: Secondary | ICD-10-CM | POA: Diagnosis not present

## 2016-02-09 DIAGNOSIS — D630 Anemia in neoplastic disease: Secondary | ICD-10-CM | POA: Diagnosis not present

## 2016-02-09 DIAGNOSIS — N2589 Other disorders resulting from impaired renal tubular function: Secondary | ICD-10-CM | POA: Diagnosis not present

## 2016-02-09 DIAGNOSIS — K769 Liver disease, unspecified: Secondary | ICD-10-CM | POA: Diagnosis not present

## 2016-02-09 DIAGNOSIS — N186 End stage renal disease: Secondary | ICD-10-CM | POA: Diagnosis not present

## 2016-02-09 DIAGNOSIS — D631 Anemia in chronic kidney disease: Secondary | ICD-10-CM | POA: Diagnosis not present

## 2016-02-10 DIAGNOSIS — D509 Iron deficiency anemia, unspecified: Secondary | ICD-10-CM | POA: Diagnosis not present

## 2016-02-10 DIAGNOSIS — K769 Liver disease, unspecified: Secondary | ICD-10-CM | POA: Diagnosis not present

## 2016-02-10 DIAGNOSIS — D631 Anemia in chronic kidney disease: Secondary | ICD-10-CM | POA: Diagnosis not present

## 2016-02-10 DIAGNOSIS — N2589 Other disorders resulting from impaired renal tubular function: Secondary | ICD-10-CM | POA: Diagnosis not present

## 2016-02-10 DIAGNOSIS — N186 End stage renal disease: Secondary | ICD-10-CM | POA: Diagnosis not present

## 2016-02-10 DIAGNOSIS — D630 Anemia in neoplastic disease: Secondary | ICD-10-CM | POA: Diagnosis not present

## 2016-02-11 DIAGNOSIS — N2589 Other disorders resulting from impaired renal tubular function: Secondary | ICD-10-CM | POA: Diagnosis not present

## 2016-02-11 DIAGNOSIS — K769 Liver disease, unspecified: Secondary | ICD-10-CM | POA: Diagnosis not present

## 2016-02-11 DIAGNOSIS — D631 Anemia in chronic kidney disease: Secondary | ICD-10-CM | POA: Diagnosis not present

## 2016-02-11 DIAGNOSIS — D509 Iron deficiency anemia, unspecified: Secondary | ICD-10-CM | POA: Diagnosis not present

## 2016-02-11 DIAGNOSIS — D630 Anemia in neoplastic disease: Secondary | ICD-10-CM | POA: Diagnosis not present

## 2016-02-11 DIAGNOSIS — N186 End stage renal disease: Secondary | ICD-10-CM | POA: Diagnosis not present

## 2016-02-12 DIAGNOSIS — N2589 Other disorders resulting from impaired renal tubular function: Secondary | ICD-10-CM | POA: Diagnosis not present

## 2016-02-12 DIAGNOSIS — K769 Liver disease, unspecified: Secondary | ICD-10-CM | POA: Diagnosis not present

## 2016-02-12 DIAGNOSIS — D630 Anemia in neoplastic disease: Secondary | ICD-10-CM | POA: Diagnosis not present

## 2016-02-12 DIAGNOSIS — D509 Iron deficiency anemia, unspecified: Secondary | ICD-10-CM | POA: Diagnosis not present

## 2016-02-12 DIAGNOSIS — N186 End stage renal disease: Secondary | ICD-10-CM | POA: Diagnosis not present

## 2016-02-12 DIAGNOSIS — D631 Anemia in chronic kidney disease: Secondary | ICD-10-CM | POA: Diagnosis not present

## 2016-02-13 DIAGNOSIS — K769 Liver disease, unspecified: Secondary | ICD-10-CM | POA: Diagnosis not present

## 2016-02-13 DIAGNOSIS — N2589 Other disorders resulting from impaired renal tubular function: Secondary | ICD-10-CM | POA: Diagnosis not present

## 2016-02-13 DIAGNOSIS — N186 End stage renal disease: Secondary | ICD-10-CM | POA: Diagnosis not present

## 2016-02-13 DIAGNOSIS — D631 Anemia in chronic kidney disease: Secondary | ICD-10-CM | POA: Diagnosis not present

## 2016-02-13 DIAGNOSIS — D509 Iron deficiency anemia, unspecified: Secondary | ICD-10-CM | POA: Diagnosis not present

## 2016-02-13 DIAGNOSIS — D630 Anemia in neoplastic disease: Secondary | ICD-10-CM | POA: Diagnosis not present

## 2016-02-14 DIAGNOSIS — N186 End stage renal disease: Secondary | ICD-10-CM | POA: Diagnosis not present

## 2016-02-14 DIAGNOSIS — D509 Iron deficiency anemia, unspecified: Secondary | ICD-10-CM | POA: Diagnosis not present

## 2016-02-14 DIAGNOSIS — D631 Anemia in chronic kidney disease: Secondary | ICD-10-CM | POA: Diagnosis not present

## 2016-02-14 DIAGNOSIS — K769 Liver disease, unspecified: Secondary | ICD-10-CM | POA: Diagnosis not present

## 2016-02-14 DIAGNOSIS — D630 Anemia in neoplastic disease: Secondary | ICD-10-CM | POA: Diagnosis not present

## 2016-02-14 DIAGNOSIS — N2589 Other disorders resulting from impaired renal tubular function: Secondary | ICD-10-CM | POA: Diagnosis not present

## 2016-02-15 DIAGNOSIS — N186 End stage renal disease: Secondary | ICD-10-CM | POA: Diagnosis not present

## 2016-02-15 DIAGNOSIS — D630 Anemia in neoplastic disease: Secondary | ICD-10-CM | POA: Diagnosis not present

## 2016-02-15 DIAGNOSIS — N2589 Other disorders resulting from impaired renal tubular function: Secondary | ICD-10-CM | POA: Diagnosis not present

## 2016-02-15 DIAGNOSIS — K769 Liver disease, unspecified: Secondary | ICD-10-CM | POA: Diagnosis not present

## 2016-02-15 DIAGNOSIS — D509 Iron deficiency anemia, unspecified: Secondary | ICD-10-CM | POA: Diagnosis not present

## 2016-02-15 DIAGNOSIS — D631 Anemia in chronic kidney disease: Secondary | ICD-10-CM | POA: Diagnosis not present

## 2016-02-16 DIAGNOSIS — D631 Anemia in chronic kidney disease: Secondary | ICD-10-CM | POA: Diagnosis not present

## 2016-02-16 DIAGNOSIS — D509 Iron deficiency anemia, unspecified: Secondary | ICD-10-CM | POA: Diagnosis not present

## 2016-02-16 DIAGNOSIS — K769 Liver disease, unspecified: Secondary | ICD-10-CM | POA: Diagnosis not present

## 2016-02-16 DIAGNOSIS — D630 Anemia in neoplastic disease: Secondary | ICD-10-CM | POA: Diagnosis not present

## 2016-02-16 DIAGNOSIS — N186 End stage renal disease: Secondary | ICD-10-CM | POA: Diagnosis not present

## 2016-02-16 DIAGNOSIS — N2589 Other disorders resulting from impaired renal tubular function: Secondary | ICD-10-CM | POA: Diagnosis not present

## 2016-02-17 DIAGNOSIS — N186 End stage renal disease: Secondary | ICD-10-CM | POA: Diagnosis not present

## 2016-02-17 DIAGNOSIS — D631 Anemia in chronic kidney disease: Secondary | ICD-10-CM | POA: Diagnosis not present

## 2016-02-17 DIAGNOSIS — D630 Anemia in neoplastic disease: Secondary | ICD-10-CM | POA: Diagnosis not present

## 2016-02-17 DIAGNOSIS — N2589 Other disorders resulting from impaired renal tubular function: Secondary | ICD-10-CM | POA: Diagnosis not present

## 2016-02-17 DIAGNOSIS — K769 Liver disease, unspecified: Secondary | ICD-10-CM | POA: Diagnosis not present

## 2016-02-17 DIAGNOSIS — D509 Iron deficiency anemia, unspecified: Secondary | ICD-10-CM | POA: Diagnosis not present

## 2016-02-18 DIAGNOSIS — N186 End stage renal disease: Secondary | ICD-10-CM | POA: Diagnosis not present

## 2016-02-18 DIAGNOSIS — N2589 Other disorders resulting from impaired renal tubular function: Secondary | ICD-10-CM | POA: Diagnosis not present

## 2016-02-18 DIAGNOSIS — D630 Anemia in neoplastic disease: Secondary | ICD-10-CM | POA: Diagnosis not present

## 2016-02-18 DIAGNOSIS — K769 Liver disease, unspecified: Secondary | ICD-10-CM | POA: Diagnosis not present

## 2016-02-18 DIAGNOSIS — D509 Iron deficiency anemia, unspecified: Secondary | ICD-10-CM | POA: Diagnosis not present

## 2016-02-18 DIAGNOSIS — D631 Anemia in chronic kidney disease: Secondary | ICD-10-CM | POA: Diagnosis not present

## 2016-02-19 DIAGNOSIS — D509 Iron deficiency anemia, unspecified: Secondary | ICD-10-CM | POA: Diagnosis not present

## 2016-02-19 DIAGNOSIS — D630 Anemia in neoplastic disease: Secondary | ICD-10-CM | POA: Diagnosis not present

## 2016-02-19 DIAGNOSIS — N186 End stage renal disease: Secondary | ICD-10-CM | POA: Diagnosis not present

## 2016-02-19 DIAGNOSIS — N2589 Other disorders resulting from impaired renal tubular function: Secondary | ICD-10-CM | POA: Diagnosis not present

## 2016-02-19 DIAGNOSIS — D631 Anemia in chronic kidney disease: Secondary | ICD-10-CM | POA: Diagnosis not present

## 2016-02-19 DIAGNOSIS — K769 Liver disease, unspecified: Secondary | ICD-10-CM | POA: Diagnosis not present

## 2016-02-20 DIAGNOSIS — D509 Iron deficiency anemia, unspecified: Secondary | ICD-10-CM | POA: Diagnosis not present

## 2016-02-20 DIAGNOSIS — D630 Anemia in neoplastic disease: Secondary | ICD-10-CM | POA: Diagnosis not present

## 2016-02-20 DIAGNOSIS — N2589 Other disorders resulting from impaired renal tubular function: Secondary | ICD-10-CM | POA: Diagnosis not present

## 2016-02-20 DIAGNOSIS — K769 Liver disease, unspecified: Secondary | ICD-10-CM | POA: Diagnosis not present

## 2016-02-20 DIAGNOSIS — N186 End stage renal disease: Secondary | ICD-10-CM | POA: Diagnosis not present

## 2016-02-20 DIAGNOSIS — D631 Anemia in chronic kidney disease: Secondary | ICD-10-CM | POA: Diagnosis not present

## 2016-02-21 DIAGNOSIS — D631 Anemia in chronic kidney disease: Secondary | ICD-10-CM | POA: Diagnosis not present

## 2016-02-21 DIAGNOSIS — D630 Anemia in neoplastic disease: Secondary | ICD-10-CM | POA: Diagnosis not present

## 2016-02-21 DIAGNOSIS — N2589 Other disorders resulting from impaired renal tubular function: Secondary | ICD-10-CM | POA: Diagnosis not present

## 2016-02-21 DIAGNOSIS — N186 End stage renal disease: Secondary | ICD-10-CM | POA: Diagnosis not present

## 2016-02-21 DIAGNOSIS — K769 Liver disease, unspecified: Secondary | ICD-10-CM | POA: Diagnosis not present

## 2016-02-21 DIAGNOSIS — D509 Iron deficiency anemia, unspecified: Secondary | ICD-10-CM | POA: Diagnosis not present

## 2016-02-22 DIAGNOSIS — N186 End stage renal disease: Secondary | ICD-10-CM | POA: Diagnosis not present

## 2016-02-22 DIAGNOSIS — F319 Bipolar disorder, unspecified: Secondary | ICD-10-CM | POA: Diagnosis not present

## 2016-02-22 DIAGNOSIS — N2589 Other disorders resulting from impaired renal tubular function: Secondary | ICD-10-CM | POA: Diagnosis not present

## 2016-02-22 DIAGNOSIS — K769 Liver disease, unspecified: Secondary | ICD-10-CM | POA: Diagnosis not present

## 2016-02-22 DIAGNOSIS — D631 Anemia in chronic kidney disease: Secondary | ICD-10-CM | POA: Diagnosis not present

## 2016-02-22 DIAGNOSIS — D630 Anemia in neoplastic disease: Secondary | ICD-10-CM | POA: Diagnosis not present

## 2016-02-22 DIAGNOSIS — D509 Iron deficiency anemia, unspecified: Secondary | ICD-10-CM | POA: Diagnosis not present

## 2016-02-23 DIAGNOSIS — D630 Anemia in neoplastic disease: Secondary | ICD-10-CM | POA: Diagnosis not present

## 2016-02-23 DIAGNOSIS — D509 Iron deficiency anemia, unspecified: Secondary | ICD-10-CM | POA: Diagnosis not present

## 2016-02-23 DIAGNOSIS — N186 End stage renal disease: Secondary | ICD-10-CM | POA: Diagnosis not present

## 2016-02-23 DIAGNOSIS — K769 Liver disease, unspecified: Secondary | ICD-10-CM | POA: Diagnosis not present

## 2016-02-23 DIAGNOSIS — N2589 Other disorders resulting from impaired renal tubular function: Secondary | ICD-10-CM | POA: Diagnosis not present

## 2016-02-23 DIAGNOSIS — D631 Anemia in chronic kidney disease: Secondary | ICD-10-CM | POA: Diagnosis not present

## 2016-02-24 DIAGNOSIS — N2589 Other disorders resulting from impaired renal tubular function: Secondary | ICD-10-CM | POA: Diagnosis not present

## 2016-02-24 DIAGNOSIS — N186 End stage renal disease: Secondary | ICD-10-CM | POA: Diagnosis not present

## 2016-02-24 DIAGNOSIS — D631 Anemia in chronic kidney disease: Secondary | ICD-10-CM | POA: Diagnosis not present

## 2016-02-24 DIAGNOSIS — K769 Liver disease, unspecified: Secondary | ICD-10-CM | POA: Diagnosis not present

## 2016-02-24 DIAGNOSIS — D630 Anemia in neoplastic disease: Secondary | ICD-10-CM | POA: Diagnosis not present

## 2016-02-24 DIAGNOSIS — D509 Iron deficiency anemia, unspecified: Secondary | ICD-10-CM | POA: Diagnosis not present

## 2016-02-25 DIAGNOSIS — D631 Anemia in chronic kidney disease: Secondary | ICD-10-CM | POA: Diagnosis not present

## 2016-02-25 DIAGNOSIS — K769 Liver disease, unspecified: Secondary | ICD-10-CM | POA: Diagnosis not present

## 2016-02-25 DIAGNOSIS — N2589 Other disorders resulting from impaired renal tubular function: Secondary | ICD-10-CM | POA: Diagnosis not present

## 2016-02-25 DIAGNOSIS — N186 End stage renal disease: Secondary | ICD-10-CM | POA: Diagnosis not present

## 2016-02-25 DIAGNOSIS — D630 Anemia in neoplastic disease: Secondary | ICD-10-CM | POA: Diagnosis not present

## 2016-02-25 DIAGNOSIS — D509 Iron deficiency anemia, unspecified: Secondary | ICD-10-CM | POA: Diagnosis not present

## 2016-02-26 ENCOUNTER — Observation Stay (HOSPITAL_COMMUNITY)
Admission: EM | Admit: 2016-02-26 | Discharge: 2016-02-27 | Disposition: A | Discharge status: Home / Self Care | Payer: Medicare Other | Attending: Internal Medicine | Admitting: Internal Medicine

## 2016-02-26 ENCOUNTER — Emergency Department (HOSPITAL_COMMUNITY): Payer: Medicare Other

## 2016-02-26 ENCOUNTER — Encounter (HOSPITAL_COMMUNITY): Payer: Self-pay | Admitting: *Deleted

## 2016-02-26 DIAGNOSIS — E039 Hypothyroidism, unspecified: Secondary | ICD-10-CM | POA: Diagnosis not present

## 2016-02-26 DIAGNOSIS — Z79899 Other long term (current) drug therapy: Secondary | ICD-10-CM | POA: Diagnosis not present

## 2016-02-26 DIAGNOSIS — R509 Fever, unspecified: Secondary | ICD-10-CM | POA: Diagnosis not present

## 2016-02-26 DIAGNOSIS — Z4902 Encounter for fitting and adjustment of peritoneal dialysis catheter: Secondary | ICD-10-CM | POA: Diagnosis not present

## 2016-02-26 DIAGNOSIS — F319 Bipolar disorder, unspecified: Secondary | ICD-10-CM | POA: Diagnosis present

## 2016-02-26 DIAGNOSIS — E871 Hypo-osmolality and hyponatremia: Secondary | ICD-10-CM | POA: Insufficient documentation

## 2016-02-26 DIAGNOSIS — R05 Cough: Secondary | ICD-10-CM | POA: Diagnosis not present

## 2016-02-26 DIAGNOSIS — K769 Liver disease, unspecified: Secondary | ICD-10-CM | POA: Diagnosis not present

## 2016-02-26 DIAGNOSIS — J1081 Influenza due to other identified influenza virus with encephalopathy: Principal | ICD-10-CM | POA: Insufficient documentation

## 2016-02-26 DIAGNOSIS — J111 Influenza due to unidentified influenza virus with other respiratory manifestations: Secondary | ICD-10-CM | POA: Diagnosis present

## 2016-02-26 DIAGNOSIS — D509 Iron deficiency anemia, unspecified: Secondary | ICD-10-CM | POA: Diagnosis not present

## 2016-02-26 DIAGNOSIS — R531 Weakness: Secondary | ICD-10-CM | POA: Diagnosis not present

## 2016-02-26 DIAGNOSIS — Z992 Dependence on renal dialysis: Secondary | ICD-10-CM | POA: Diagnosis not present

## 2016-02-26 DIAGNOSIS — G9340 Encephalopathy, unspecified: Secondary | ICD-10-CM | POA: Diagnosis present

## 2016-02-26 DIAGNOSIS — N186 End stage renal disease: Secondary | ICD-10-CM | POA: Diagnosis not present

## 2016-02-26 DIAGNOSIS — F209 Schizophrenia, unspecified: Secondary | ICD-10-CM | POA: Insufficient documentation

## 2016-02-26 DIAGNOSIS — N2589 Other disorders resulting from impaired renal tubular function: Secondary | ICD-10-CM | POA: Diagnosis not present

## 2016-02-26 DIAGNOSIS — D631 Anemia in chronic kidney disease: Secondary | ICD-10-CM | POA: Diagnosis not present

## 2016-02-26 DIAGNOSIS — E860 Dehydration: Secondary | ICD-10-CM | POA: Diagnosis not present

## 2016-02-26 DIAGNOSIS — D630 Anemia in neoplastic disease: Secondary | ICD-10-CM | POA: Diagnosis not present

## 2016-02-26 LAB — CBC WITH DIFFERENTIAL/PLATELET
BASOS ABS: 0 10*3/uL (ref 0.0–0.1)
BASOS PCT: 0 %
Eosinophils Absolute: 0 10*3/uL (ref 0.0–0.7)
Eosinophils Relative: 0 %
HCT: 33.1 % — ABNORMAL LOW (ref 39.0–52.0)
Hemoglobin: 10.9 g/dL — ABNORMAL LOW (ref 13.0–17.0)
Lymphocytes Relative: 14 %
Lymphs Abs: 1.7 10*3/uL (ref 0.7–4.0)
MCH: 31.9 pg (ref 26.0–34.0)
MCHC: 32.9 g/dL (ref 30.0–36.0)
MCV: 96.8 fL (ref 78.0–100.0)
MONO ABS: 1.6 10*3/uL — AB (ref 0.1–1.0)
MONOS PCT: 13 %
Neutro Abs: 9.2 10*3/uL — ABNORMAL HIGH (ref 1.7–7.7)
Neutrophils Relative %: 73 %
PLATELETS: 162 10*3/uL (ref 150–400)
RBC: 3.42 MIL/uL — ABNORMAL LOW (ref 4.22–5.81)
RDW: 16.8 % — AB (ref 11.5–15.5)
WBC: 12.5 10*3/uL — ABNORMAL HIGH (ref 4.0–10.5)

## 2016-02-26 LAB — I-STAT CG4 LACTIC ACID, ED: LACTIC ACID, VENOUS: 1.15 mmol/L (ref 0.5–1.9)

## 2016-02-26 MED ORDER — ACETAMINOPHEN 325 MG PO TABS
650.0000 mg | ORAL_TABLET | Freq: Once | ORAL | Status: AC
  Administered 2016-02-26: 650 mg via ORAL
  Filled 2016-02-26: qty 2

## 2016-02-26 MED ORDER — SODIUM CHLORIDE 0.9 % IV BOLUS (SEPSIS)
500.0000 mL | Freq: Once | INTRAVENOUS | Status: AC
  Administered 2016-02-26: 500 mL via INTRAVENOUS

## 2016-02-26 NOTE — ED Triage Notes (Addendum)
Pt has had a fever, chills, cough, has felt fatigued and family states that he acts a little confused.  Pt's BP last night was in the 66/51.
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
## 2016-02-27 DIAGNOSIS — Z992 Dependence on renal dialysis: Secondary | ICD-10-CM | POA: Diagnosis not present

## 2016-02-27 DIAGNOSIS — K769 Liver disease, unspecified: Secondary | ICD-10-CM | POA: Diagnosis not present

## 2016-02-27 DIAGNOSIS — J111 Influenza due to unidentified influenza virus with other respiratory manifestations: Secondary | ICD-10-CM | POA: Diagnosis not present

## 2016-02-27 DIAGNOSIS — N186 End stage renal disease: Secondary | ICD-10-CM | POA: Diagnosis not present

## 2016-02-27 DIAGNOSIS — R05 Cough: Secondary | ICD-10-CM | POA: Diagnosis not present

## 2016-02-27 DIAGNOSIS — N2589 Other disorders resulting from impaired renal tubular function: Secondary | ICD-10-CM | POA: Diagnosis not present

## 2016-02-27 DIAGNOSIS — G9340 Encephalopathy, unspecified: Secondary | ICD-10-CM | POA: Diagnosis not present

## 2016-02-27 DIAGNOSIS — D631 Anemia in chronic kidney disease: Secondary | ICD-10-CM | POA: Diagnosis not present

## 2016-02-27 DIAGNOSIS — D630 Anemia in neoplastic disease: Secondary | ICD-10-CM | POA: Diagnosis not present

## 2016-02-27 DIAGNOSIS — D509 Iron deficiency anemia, unspecified: Secondary | ICD-10-CM | POA: Diagnosis not present

## 2016-02-27 LAB — COMPREHENSIVE METABOLIC PANEL
ALT: 11 U/L — ABNORMAL LOW (ref 17–63)
AST: 13 U/L — AB (ref 15–41)
Albumin: 2.9 g/dL — ABNORMAL LOW (ref 3.5–5.0)
Alkaline Phosphatase: 68 U/L (ref 38–126)
Anion gap: 16 — ABNORMAL HIGH (ref 5–15)
BUN: 69 mg/dL — AB (ref 6–20)
CHLORIDE: 88 mmol/L — AB (ref 101–111)
CO2: 25 mmol/L (ref 22–32)
Calcium: 8.9 mg/dL (ref 8.9–10.3)
Creatinine, Ser: 9.83 mg/dL — ABNORMAL HIGH (ref 0.61–1.24)
GFR calc Af Amer: 5 mL/min — ABNORMAL LOW (ref >60)
GFR calc non Af Amer: 5 mL/min — ABNORMAL LOW (ref >60)
Glucose, Bld: 86 mg/dL (ref 65–99)
POTASSIUM: 4.6 mmol/L (ref 3.5–5.1)
SODIUM: 129 mmol/L — AB (ref 135–145)
Total Bilirubin: 0.7 mg/dL (ref 0.3–1.2)
Total Protein: 6.4 g/dL — ABNORMAL LOW (ref 6.5–8.1)

## 2016-02-27 LAB — BASIC METABOLIC PANEL
ANION GAP: 16 — AB (ref 5–15)
BUN: 75 mg/dL — ABNORMAL HIGH (ref 6–20)
CHLORIDE: 89 mmol/L — AB (ref 101–111)
CO2: 22 mmol/L (ref 22–32)
CREATININE: 10.25 mg/dL — AB (ref 0.61–1.24)
Calcium: 8.2 mg/dL — ABNORMAL LOW (ref 8.9–10.3)
GFR calc non Af Amer: 4 mL/min — ABNORMAL LOW (ref >60)
GFR, EST AFRICAN AMERICAN: 5 mL/min — AB (ref >60)
Glucose, Bld: 95 mg/dL (ref 65–99)
Potassium: 4.6 mmol/L (ref 3.5–5.1)
SODIUM: 127 mmol/L — AB (ref 135–145)

## 2016-02-27 LAB — INFLUENZA PANEL BY PCR (TYPE A & B)
INFLAPCR: POSITIVE — AB
Influenza B By PCR: NEGATIVE

## 2016-02-27 LAB — CBC
HCT: 29.6 % — ABNORMAL LOW (ref 39.0–52.0)
HEMOGLOBIN: 9.8 g/dL — AB (ref 13.0–17.0)
MCH: 31.5 pg (ref 26.0–34.0)
MCHC: 33.1 g/dL (ref 30.0–36.0)
MCV: 95.2 fL (ref 78.0–100.0)
PLATELETS: 159 10*3/uL (ref 150–400)
RBC: 3.11 MIL/uL — AB (ref 4.22–5.81)
RDW: 16.8 % — ABNORMAL HIGH (ref 11.5–15.5)
WBC: 11 10*3/uL — ABNORMAL HIGH (ref 4.0–10.5)

## 2016-02-27 LAB — VALPROIC ACID LEVEL: Valproic Acid Lvl: 34 ug/mL — ABNORMAL LOW (ref 50.0–100.0)

## 2016-02-27 MED ORDER — ONDANSETRON HCL 4 MG/2ML IJ SOLN: 4.0000 mg | Freq: Four times a day (QID) | INTRAMUSCULAR | Status: DC | PRN

## 2016-02-27 MED ORDER — ACETAMINOPHEN 325 MG PO TABS: 650.0000 mg | ORAL_TABLET | Freq: Four times a day (QID) | ORAL | Status: DC | PRN

## 2016-02-27 MED ORDER — SODIUM CHLORIDE 0.9% FLUSH: 3.0000 mL | Freq: Two times a day (BID) | INTRAVENOUS | Status: DC

## 2016-02-27 MED ORDER — SODIUM CHLORIDE 0.9 % IV SOLN: 250.0000 mL | INTRAVENOUS | Status: DC | PRN

## 2016-02-27 MED ORDER — SODIUM CHLORIDE 0.9% FLUSH: 3.0000 mL | INTRAVENOUS | Status: DC | PRN

## 2016-02-27 MED ORDER — OSELTAMIVIR PHOSPHATE 30 MG PO CAPS: 30.0000 mg | ORAL_CAPSULE | ORAL | 0 refills | Status: DC

## 2016-02-27 MED ORDER — IBUPROFEN 400 MG PO TABS
400.0000 mg | ORAL_TABLET | Freq: Once | ORAL | Status: AC
  Administered 2016-02-27: 400 mg via ORAL
  Filled 2016-02-27: qty 1

## 2016-02-27 MED ORDER — ACETAMINOPHEN 650 MG RE SUPP: 650.0000 mg | Freq: Four times a day (QID) | RECTAL | Status: DC | PRN

## 2016-02-27 MED ORDER — OSELTAMIVIR PHOSPHATE 30 MG PO CAPS
30.0000 mg | ORAL_CAPSULE | Freq: Once | ORAL | Status: AC
  Administered 2016-02-27: 30 mg via ORAL
  Filled 2016-02-27: qty 1

## 2016-02-27 MED ORDER — ONDANSETRON HCL 4 MG PO TABS: 4.0000 mg | ORAL_TABLET | Freq: Four times a day (QID) | ORAL | Status: DC | PRN

## 2016-02-27 MED ORDER — SODIUM CHLORIDE 0.9 % IV BOLUS (SEPSIS)
500.0000 mL | Freq: Once | INTRAVENOUS | Status: AC
  Administered 2016-02-27: 500 mL via INTRAVENOUS

## 2016-02-27 NOTE — ED Notes (Signed)
Patient ambulated from room to hallway. Patient making small shuffling steps. Family states that is better than before he first came to hospital. EDP at room.
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
## 2016-02-27 NOTE — ED Notes (Signed)
Pt found by this nurse trying to get out of bed. Pt re-placed in bed. Pt has fall risk bracelet on and armband on.

## 2016-02-27 NOTE — ED Notes (Signed)
Pt given breakfast tray

## 2016-02-27 NOTE — ED Notes (Signed)
This nurse spoke with son who states his brother is on the way to pick him up. Should be here within the hour.

## 2016-02-27 NOTE — Discharge Summary (Signed)
Darren Dunn VEZ:[id number] DOB: Nov 15, 1945 DOA: 02/26/2016  PCP: Claretta Fraise, MD  Admit date: 02/26/2016  Discharge date: 02/27/2016  Admitted From: Home   Disposition:  Home   Recommendations for Outpatient Follow-up:   Follow up with PCP in 1-2 weeks  PCP Please obtain BMP/CBC, 2 view CXR in 1week,  (see Discharge instructions)   PCP Please follow up on the following pending results: None   Home Health: None   Equipment/Devices: None  Consultations: None Discharge Condition: Fair   CODE STATUS: Full   Diet Recommendation: Diet renal/carb modified     Chief Complaint  Patient presents with  . Fever     Brief history of present illness from the day of admission and additional interim summary    Darren Dunn is a 71 y.o. male with medical history significant of peritoneal dialysis, bipolar brought in by his sons for fever and confusion for a day.  Pt lives alone, but his sons take turns going to turn his peritoneal dialysis on at night.  They noticed he has been having a stuff nose the last day or so and has not been feeling well.  Today he was confused and had a fever of 101 at home.  No n/v/d.  Pt feels better now after some ivf and tylenol.  He has been found to have the flu.  Pt referred for admission for his confusion and influenza infection.                                                                   Hospital Course    1. 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 A URI - Much improved, placed on Tamiflu based on his creatinine clearance after consulting pharmacy over the phone, symptom-free warts to go home, mentation has improved close to baseline. Discussed with son who is comfortable taking him home. Will be discharged on Tamiflu 4 more doses and outpatient PCP follow-up within a  week.  2. 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 This is done at home, son has made arrangements to do it again today and last dialysis was yesterday.  3. Bipolar / Schizophrenia. At baseline no acute issues home medications continued.   Discharge diagnosis     Principal Problem:   Influenza Active Problems:   Bipolar disorder ([place])   Chronic kidney disease, stage V requiring chronic dialysis (HCC)   Encephalopathy    Discharge instructions    Discharge Instructions    Diet - low sodium heart healthy    Complete by:  As directed    Discharge instructions    Complete by:  As directed    Follow with Primary MD Claretta Fraise, MD in 2 days   Get CBC, CMP, 2 view Chest X ray checked  by Primary MD  in 2 days ( we routinely change or add medications that can affect your baseline labs and fluid status, therefore we recommend that you get the mentioned basic workup next visit with your PCP, your PCP may decide not to get them or add new tests based on their clinical decision)   Activity: As tolerated with Full fall precautions use walker/cane & assistance as needed   Disposition Home   Diet:  Renal  - Check your Weight same time everyday, if you gain over 2 pounds, or you develop in leg swelling, experience more shortness of breath or chest pain, call your Primary MD immediately. Follow Cardiac Low Salt Diet and 1.5 lit/day fluid restriction.   On your next visit with your primary care physician please Get Medicines reviewed and adjusted.   Please request your Prim.MD to go over all Hospital Tests and Procedure/Radiological results at the follow up, please get all Hospital records sent to your Prim MD by signing hospital release before you go home.   If you experience worsening of your admission symptoms, develop shortness of breath, life threatening emergency, suicidal or homicidal thoughts you must seek medical attention immediately by calling 911 or calling your MD  immediately  if symptoms less severe.  You Must read complete instructions/literature along with all the possible adverse reactions/side effects for all the Medicines you take and that have been prescribed to you. Take any new Medicines after you have completely understood and accpet all the possible adverse reactions/side effects.   Do not drive, operate heavy machinery, perform activities at heights, swimming or participation in water activities or provide baby sitting services if your were admitted for syncope or siezures until you have seen by Primary MD or a Neurologist and advised to do so again.  Do not drive when taking Pain medications.    Do not take more than prescribed Pain, Sleep and Anxiety Medications  Special Instructions: If you have smoked or chewed Tobacco  in the last 2 yrs please stop smoking, stop any regular Alcohol  and or any Recreational drug use.  Wear Seat belts while driving.   Please note  You were cared for by a hospitalist during your hospital stay. If you have any questions about your discharge medications or the care you received while you were in the hospital after you are discharged, you can call the unit and asked to speak with the hospitalist on call if the hospitalist that took care of you is not available. Once you are discharged, your primary care physician will handle any further medical issues. Please note that NO REFILLS for any discharge medications will be authorized once you are discharged, as it is imperative that you return to your primary care physician (or establish a relationship with a primary care physician if you do not have one) for your aftercare needs so that they can reassess your need for medications and monitor your lab values.   Increase activity slowly    Complete by:  As directed       Discharge Medications   Allergies as of 02/27/2016      Reactions   Sulfa Antibiotics Other (See Comments)   Bruises.      Medication List     TAKE these medications   calcitRIOL 0.5 MCG capsule Commonly known as:  ROCALTROL Take 1 mcg by mouth daily.   calcium carbonate 500 MG chewable tablet Commonly known as:  TUMS - dosed in mg elemental calcium Chew 2 tablets by mouth  3 (three) times daily with meals.   divalproex 250 MG DR tablet Commonly known as:  DEPAKOTE Take 1 tablet qam and 2 tablet qpm   haloperidol 2 MG tablet Commonly known as:  HALDOL Take 2 mg by mouth daily.   levothyroxine 50 MCG tablet Commonly known as:  SYNTHROID, LEVOTHROID TAKE 1 TABLET (50 MCG TOTAL) BY MOUTH DAILY.   losartan 50 MG tablet Commonly known as:  COZAAR Take 50 mg by mouth at bedtime.   oseltamivir 30 MG capsule Commonly known as:  TAMIFLU Take 1 capsule (30 mg total) by mouth every other day.       Follow-up Information    STACKS,[name], MD. Schedule an appointment as soon as possible for a visit in 2 day(s).   Specialty:  Family Medicine Contact information: [place] [address] [phone]           Major procedures and Radiology Reports - PLEASE review detailed and final reports thoroughly  -        Dg Chest 2 View  Result Date: 02/27/2016 CLINICAL DATA:  Intermittent fever and nonproductive cough for 1 day. EXAM: CHEST  2 VIEW COMPARISON:  07/25/2006 FINDINGS: Right-sided central line extends to the cavoatrial junction. Mild linear scarring in the left base. The lungs are otherwise clear. Pulmonary vasculature is normal. No pleural effusions. Hilar and mediastinal contours are unremarkable and unchanged. IMPRESSION: No active cardiopulmonary disease. Electronically Signed   By: Andreas Newport M.D.   On: 02/27/2016 00:25    Micro Results     Recent Results (from the past 240 hour(s))  Blood culture (routine x 2)     Status: None (Preliminary result)   Collection Time: 02/26/16 11:28 PM  Result Value Ref Range Status   Specimen Description BLOOD LEFT ARM  Final   Special Requests  BOTTLES DRAWN AEROBIC AND ANAEROBIC 6CC EACH  Final   Culture NO GROWTH < 12 HOURS  Final   Report Status PENDING  Incomplete  Blood culture (routine x 2)     Status: None (Preliminary result)   Collection Time: 02/26/16 11:38 PM  Result Value Ref Range Status   Specimen Description BLOOD LEFT HAND  Final   Special Requests BOTTLES DRAWN AEROBIC ONLY 4CC ONLY  Final   Culture NO GROWTH < 12 HOURS  Final   Report Status PENDING  Incomplete    Today   Subjective    Darren Dunn today has no headache,no chest abdominal pain,no new weakness tingling or numbness, feels much better wants to go home today.     Objective   Blood pressure 147/77, pulse 89, temperature 98.3 F (36.8 C), temperature source Rectal, resp. Rate 19 , height 5' 9"  (1.753 m), weight 55.8 kg (123 lb), SpO2 98 %.   Intake/Output Summary (Last 24 hours) at 02/27/16 1104 Last data filed at 02/27/16 0726  Gross per 24 hour  Intake             1000 ml  Output                0 ml  Net             1000 ml    Exam Awake, alert, oriented x 2, No new F.N deficits, Normal affect. Mild tremors (chronic)  [place].AT,PERRAL Supple Neck,No JVD, No cervical lymphadenopathy appriciated.  Symmetrical Chest wall movement, Good air movement bilaterally, CTAB RRR,No Gallops,Rubs or new Murmurs, No Parasternal Heave +ve B.Sounds, Abd Soft, Non tender,  No organomegaly appriciated, No rebound -guarding or rigidity. No Cyanosis, Clubbing or edema, No new Rash or bruise   Data Review   CBC w Diff: Lab Results  Component Value Date   WBC 11.0 (H) 02/27/2016   HGB 9.8 (L) 02/27/2016   HCT 29.6 (L) 02/27/2016   HCT 38.7 12/27/2015   PLT 159 02/27/2016   PLT 238 12/27/2015   LYMPHOPCT 14 02/26/2016   MONOPCT 13 02/26/2016   EOSPCT 0 02/26/2016   BASOPCT 0 02/26/2016    CMP: Lab Results  Component Value Date   NA 127 (L) 02/27/2016   NA 137 12/27/2015   K 4.6 02/27/2016   CL 89 (L) 02/27/2016   CO2 22 02/27/2016   BUN  75 (H) 02/27/2016   BUN 39 (H) 12/27/2015   CREATININE 10.25 (H) 02/27/2016   PROT 6.4 (L) 02/26/2016   PROT 6.0 12/27/2015   ALBUMIN 2.9 (L) 02/26/2016   ALBUMIN 3.6 12/27/2015   BILITOT 0.7 02/26/2016   BILITOT 0.3 12/27/2015   ALKPHOS 68 02/26/2016   AST 13 (L) 02/26/2016   ALT 11 (L) 02/26/2016  .   Total Time in preparing paper work, data evaluation and todays exam - 35 minutes  Thurnell Lose M.D on 02/27/2016 at 11:04 AM  [facility]   Office  [phone]

## 2016-02-27 NOTE — Discharge Instructions (Signed)
Follow with Primary MD Claretta Fraise, MD in 2 days   Get CBC, CMP, 2 view Chest X ray checked  by Primary MD in 2 days ( we routinely change or add medications that can affect your baseline labs and fluid status, therefore we recommend that you get the mentioned basic workup next visit with your PCP, your PCP may decide not to get them or add new tests based on their clinical decision)   Activity: As tolerated with Full fall precautions use walker/cane & assistance as needed   Disposition Home   Diet:  Renal  - Check your Weight same time everyday, if you gain over 2 pounds, or you develop in leg swelling, experience more shortness of breath or chest pain, call your Primary MD immediately. Follow Cardiac Low Salt Diet and 1.5 lit/day fluid restriction.   On your next visit with your primary care physician please Get Medicines reviewed and adjusted.   Please request your Prim.MD to go over all Hospital Tests and Procedure/Radiological results at the follow up, please get all Hospital records sent to your Prim MD by signing hospital release before you go home.   If you experience worsening of your admission symptoms, develop shortness of breath, life threatening emergency, suicidal or homicidal thoughts you must seek medical attention immediately by calling 911 or calling your MD immediately  if symptoms less severe.  You Must read complete instructions/literature along with all the possible adverse reactions/side effects for all the Medicines you take and that have been prescribed to you. Take any new Medicines after you have completely understood and accpet all the possible adverse reactions/side effects.   Do not drive, operate heavy machinery, perform activities at heights, swimming or participation in water activities or provide baby sitting services if your were admitted for syncope or siezures until you have seen by Primary MD or a Neurologist and advised to do so again.  Do not drive  when taking Pain medications.    Do not take more than prescribed Pain, Sleep and Anxiety Medications  Special Instructions: If you have smoked or chewed Tobacco  in the last 2 yrs please stop smoking, stop any regular Alcohol  and or any Recreational drug use.  Wear Seat belts while driving.   Please note  You were cared for by a hospitalist during your hospital stay. If you have any questions about your discharge medications or the care you received while you were in the hospital after you are discharged, you can call the unit and asked to speak with the hospitalist on call if the hospitalist that took care of you is not available. Once you are discharged, your primary care physician will handle any further medical issues. Please note that NO REFILLS for any discharge medications will be authorized once you are discharged, as it is imperative that you return to your primary care physician (or establish a relationship with a primary care physician if you do not have one) for your aftercare needs so that they can reassess your need for medications and monitor your lab values.

## 2016-02-28 DIAGNOSIS — D509 Iron deficiency anemia, unspecified: Secondary | ICD-10-CM | POA: Diagnosis not present

## 2016-02-28 DIAGNOSIS — N2589 Other disorders resulting from impaired renal tubular function: Secondary | ICD-10-CM | POA: Diagnosis not present

## 2016-02-28 DIAGNOSIS — D630 Anemia in neoplastic disease: Secondary | ICD-10-CM | POA: Diagnosis not present

## 2016-02-28 DIAGNOSIS — K769 Liver disease, unspecified: Secondary | ICD-10-CM | POA: Diagnosis not present

## 2016-02-28 DIAGNOSIS — D631 Anemia in chronic kidney disease: Secondary | ICD-10-CM | POA: Diagnosis not present

## 2016-02-28 DIAGNOSIS — N186 End stage renal disease: Secondary | ICD-10-CM | POA: Diagnosis not present

## 2016-02-29 ENCOUNTER — Telehealth: Payer: Self-pay | Admitting: Family Medicine

## 2016-02-29 DIAGNOSIS — D631 Anemia in chronic kidney disease: Secondary | ICD-10-CM | POA: Diagnosis not present

## 2016-02-29 DIAGNOSIS — N186 End stage renal disease: Secondary | ICD-10-CM | POA: Diagnosis not present

## 2016-02-29 DIAGNOSIS — D630 Anemia in neoplastic disease: Secondary | ICD-10-CM | POA: Diagnosis not present

## 2016-02-29 DIAGNOSIS — N2589 Other disorders resulting from impaired renal tubular function: Secondary | ICD-10-CM | POA: Diagnosis not present

## 2016-02-29 DIAGNOSIS — I7789 Other specified disorders of arteries and arterioles: Secondary | ICD-10-CM | POA: Diagnosis not present

## 2016-02-29 DIAGNOSIS — D509 Iron deficiency anemia, unspecified: Secondary | ICD-10-CM | POA: Diagnosis not present

## 2016-02-29 DIAGNOSIS — K769 Liver disease, unspecified: Secondary | ICD-10-CM | POA: Diagnosis not present

## 2016-02-29 DIAGNOSIS — Z992 Dependence on renal dialysis: Secondary | ICD-10-CM | POA: Diagnosis not present

## 2016-03-01 DIAGNOSIS — Z79899 Other long term (current) drug therapy: Secondary | ICD-10-CM | POA: Diagnosis not present

## 2016-03-01 DIAGNOSIS — D630 Anemia in neoplastic disease: Secondary | ICD-10-CM | POA: Diagnosis not present

## 2016-03-01 DIAGNOSIS — D509 Iron deficiency anemia, unspecified: Secondary | ICD-10-CM | POA: Diagnosis not present

## 2016-03-01 DIAGNOSIS — E440 Moderate protein-calorie malnutrition: Secondary | ICD-10-CM | POA: Diagnosis not present

## 2016-03-01 DIAGNOSIS — R17 Unspecified jaundice: Secondary | ICD-10-CM | POA: Diagnosis not present

## 2016-03-01 DIAGNOSIS — K769 Liver disease, unspecified: Secondary | ICD-10-CM | POA: Diagnosis not present

## 2016-03-01 DIAGNOSIS — N2581 Secondary hyperparathyroidism of renal origin: Secondary | ICD-10-CM | POA: Diagnosis not present

## 2016-03-01 DIAGNOSIS — D631 Anemia in chronic kidney disease: Secondary | ICD-10-CM | POA: Diagnosis not present

## 2016-03-01 DIAGNOSIS — N186 End stage renal disease: Secondary | ICD-10-CM | POA: Diagnosis not present

## 2016-03-01 NOTE — Telephone Encounter (Signed)
Patients son states that last night when he left from the patients home the diarrhea seemed to be improving. He is on his way to check on patient now. He was advised to call us back if the diarrhea has not improved or is getting worse.

## 2016-03-02 DIAGNOSIS — D631 Anemia in chronic kidney disease: Secondary | ICD-10-CM | POA: Diagnosis not present

## 2016-03-02 DIAGNOSIS — N186 End stage renal disease: Secondary | ICD-10-CM | POA: Diagnosis not present

## 2016-03-02 DIAGNOSIS — R17 Unspecified jaundice: Secondary | ICD-10-CM | POA: Diagnosis not present

## 2016-03-02 DIAGNOSIS — D630 Anemia in neoplastic disease: Secondary | ICD-10-CM | POA: Diagnosis not present

## 2016-03-02 DIAGNOSIS — Z79899 Other long term (current) drug therapy: Secondary | ICD-10-CM | POA: Diagnosis not present

## 2016-03-02 DIAGNOSIS — N2581 Secondary hyperparathyroidism of renal origin: Secondary | ICD-10-CM | POA: Diagnosis not present

## 2016-03-02 LAB — CULTURE, BLOOD (ROUTINE X 2)
CULTURE: NO GROWTH
CULTURE: NO GROWTH

## 2016-03-03 ENCOUNTER — Encounter (HOSPITAL_COMMUNITY): Payer: Self-pay | Admitting: Emergency Medicine

## 2016-03-03 ENCOUNTER — Inpatient Hospital Stay (HOSPITAL_COMMUNITY)
Admission: EM | Admit: 2016-03-03 | Discharge: 2016-03-06 | DRG: 871 | Disposition: A | Discharge status: Home Health | Payer: Medicare Other | Attending: Family Medicine | Admitting: Family Medicine

## 2016-03-03 ENCOUNTER — Emergency Department (HOSPITAL_COMMUNITY): Payer: Medicare Other

## 2016-03-03 DIAGNOSIS — K900 Celiac disease: Secondary | ICD-10-CM | POA: Diagnosis present

## 2016-03-03 DIAGNOSIS — G40909 Epilepsy, unspecified, not intractable, without status epilepticus: Secondary | ICD-10-CM | POA: Diagnosis present

## 2016-03-03 DIAGNOSIS — D630 Anemia in neoplastic disease: Secondary | ICD-10-CM | POA: Diagnosis not present

## 2016-03-03 DIAGNOSIS — D631 Anemia in chronic kidney disease: Secondary | ICD-10-CM | POA: Diagnosis present

## 2016-03-03 DIAGNOSIS — J111 Influenza due to unidentified influenza virus with other respiratory manifestations: Secondary | ICD-10-CM | POA: Diagnosis present

## 2016-03-03 DIAGNOSIS — Z79899 Other long term (current) drug therapy: Secondary | ICD-10-CM

## 2016-03-03 DIAGNOSIS — J159 Unspecified bacterial pneumonia: Secondary | ICD-10-CM | POA: Diagnosis not present

## 2016-03-03 DIAGNOSIS — E039 Hypothyroidism, unspecified: Secondary | ICD-10-CM | POA: Diagnosis present

## 2016-03-03 DIAGNOSIS — J9601 Acute respiratory failure with hypoxia: Secondary | ICD-10-CM | POA: Diagnosis not present

## 2016-03-03 DIAGNOSIS — E872 Acidosis: Secondary | ICD-10-CM | POA: Diagnosis not present

## 2016-03-03 DIAGNOSIS — R918 Other nonspecific abnormal finding of lung field: Secondary | ICD-10-CM | POA: Diagnosis not present

## 2016-03-03 DIAGNOSIS — R531 Weakness: Secondary | ICD-10-CM | POA: Diagnosis not present

## 2016-03-03 DIAGNOSIS — R404 Transient alteration of awareness: Secondary | ICD-10-CM | POA: Diagnosis not present

## 2016-03-03 DIAGNOSIS — Z8249 Family history of ischemic heart disease and other diseases of the circulatory system: Secondary | ICD-10-CM

## 2016-03-03 DIAGNOSIS — R17 Unspecified jaundice: Secondary | ICD-10-CM | POA: Diagnosis not present

## 2016-03-03 DIAGNOSIS — N186 End stage renal disease: Secondary | ICD-10-CM | POA: Diagnosis not present

## 2016-03-03 DIAGNOSIS — Z915 Personal history of self-harm: Secondary | ICD-10-CM

## 2016-03-03 DIAGNOSIS — F17220 Nicotine dependence, chewing tobacco, uncomplicated: Secondary | ICD-10-CM | POA: Diagnosis present

## 2016-03-03 DIAGNOSIS — F319 Bipolar disorder, unspecified: Secondary | ICD-10-CM | POA: Diagnosis present

## 2016-03-03 DIAGNOSIS — E440 Moderate protein-calorie malnutrition: Secondary | ICD-10-CM | POA: Insufficient documentation

## 2016-03-03 DIAGNOSIS — J129 Viral pneumonia, unspecified: Secondary | ICD-10-CM | POA: Diagnosis not present

## 2016-03-03 DIAGNOSIS — Z992 Dependence on renal dialysis: Secondary | ICD-10-CM

## 2016-03-03 DIAGNOSIS — A419 Sepsis, unspecified organism: Principal | ICD-10-CM | POA: Diagnosis present

## 2016-03-03 DIAGNOSIS — I120 Hypertensive chronic kidney disease with stage 5 chronic kidney disease or end stage renal disease: Secondary | ICD-10-CM | POA: Diagnosis present

## 2016-03-03 DIAGNOSIS — N2581 Secondary hyperparathyroidism of renal origin: Secondary | ICD-10-CM | POA: Diagnosis present

## 2016-03-03 DIAGNOSIS — F4489 Other dissociative and conversion disorders: Secondary | ICD-10-CM | POA: Diagnosis not present

## 2016-03-03 DIAGNOSIS — N185 Chronic kidney disease, stage 5: Secondary | ICD-10-CM | POA: Diagnosis present

## 2016-03-03 DIAGNOSIS — R402411 Glasgow coma scale score 13-15, in the field [EMT or ambulance]: Secondary | ICD-10-CM | POA: Diagnosis not present

## 2016-03-03 DIAGNOSIS — E876 Hypokalemia: Secondary | ICD-10-CM | POA: Diagnosis present

## 2016-03-03 DIAGNOSIS — I482 Chronic atrial fibrillation: Secondary | ICD-10-CM | POA: Diagnosis not present

## 2016-03-03 DIAGNOSIS — R4182 Altered mental status, unspecified: Secondary | ICD-10-CM | POA: Diagnosis present

## 2016-03-03 LAB — CBC
HCT: 27.9 % — ABNORMAL LOW (ref 39.0–52.0)
Hemoglobin: 9.3 g/dL — ABNORMAL LOW (ref 13.0–17.0)
MCH: 30.5 pg (ref 26.0–34.0)
MCHC: 33.3 g/dL (ref 30.0–36.0)
MCV: 91.5 fL (ref 78.0–100.0)
Platelets: 177 10*3/uL (ref 150–400)
RBC: 3.05 MIL/uL — ABNORMAL LOW (ref 4.22–5.81)
RDW: 16.5 % — AB (ref 11.5–15.5)
WBC: 13.8 10*3/uL — ABNORMAL HIGH (ref 4.0–10.5)

## 2016-03-03 LAB — BASIC METABOLIC PANEL
Anion gap: 19 — ABNORMAL HIGH (ref 5–15)
BUN: 56 mg/dL — AB (ref 6–20)
CALCIUM: 8.3 mg/dL — AB (ref 8.9–10.3)
CO2: 22 mmol/L (ref 22–32)
CREATININE: 8.52 mg/dL — AB (ref 0.61–1.24)
Chloride: 87 mmol/L — ABNORMAL LOW (ref 101–111)
GFR calc Af Amer: 6 mL/min — ABNORMAL LOW (ref >60)
GFR, EST NON AFRICAN AMERICAN: 6 mL/min — AB (ref >60)
GLUCOSE: 109 mg/dL — AB (ref 65–99)
Potassium: 3.2 mmol/L — ABNORMAL LOW (ref 3.5–5.1)
Sodium: 128 mmol/L — ABNORMAL LOW (ref 135–145)

## 2016-03-03 LAB — HEPATIC FUNCTION PANEL
ALT: 29 U/L (ref 17–63)
AST: 44 U/L — ABNORMAL HIGH (ref 15–41)
Albumin: 2.1 g/dL — ABNORMAL LOW (ref 3.5–5.0)
Alkaline Phosphatase: 69 U/L (ref 38–126)
Bilirubin, Direct: 0.2 mg/dL (ref 0.1–0.5)
Indirect Bilirubin: 0.8 mg/dL (ref 0.3–0.9)
Total Bilirubin: 1 mg/dL (ref 0.3–1.2)
Total Protein: 5.6 g/dL — ABNORMAL LOW (ref 6.5–8.1)

## 2016-03-03 LAB — I-STAT TROPONIN, ED: Troponin i, poc: 0.02 ng/mL (ref 0.00–0.08)

## 2016-03-03 LAB — I-STAT CG4 LACTIC ACID, ED: Lactic Acid, Venous: 2.75 mmol/L (ref 0.5–1.9)

## 2016-03-03 LAB — AMMONIA: Ammonia: 19 umol/L (ref 9–35)

## 2016-03-03 MED ORDER — DIVALPROEX SODIUM 250 MG PO DR TAB
500.0000 mg | DELAYED_RELEASE_TABLET | Freq: Every day | ORAL | Status: DC
  Administered 2016-03-04 – 2016-03-05 (×3): 500 mg via ORAL
  Filled 2016-03-03 (×4): qty 2

## 2016-03-03 MED ORDER — LEVOTHYROXINE SODIUM 50 MCG PO TABS
50.0000 ug | ORAL_TABLET | Freq: Every day | ORAL | Status: DC
  Administered 2016-03-04 – 2016-03-06 (×3): 50 ug via ORAL
  Filled 2016-03-03 (×3): qty 1

## 2016-03-03 MED ORDER — ADULT MULTIVITAMIN W/MINERALS CH
1.0000 | ORAL_TABLET | Freq: Every day | ORAL | Status: DC
  Administered 2016-03-04 – 2016-03-06 (×3): 1 via ORAL
  Filled 2016-03-03 (×3): qty 1

## 2016-03-03 MED ORDER — SODIUM CHLORIDE 0.9 % IV SOLN: 250.0000 mL | INTRAVENOUS | Status: DC | PRN

## 2016-03-03 MED ORDER — ACETAMINOPHEN 650 MG RE SUPP
650.0000 mg | Freq: Four times a day (QID) | RECTAL | Status: DC | PRN
Start: 2016-03-03 — End: 2016-03-06

## 2016-03-03 MED ORDER — HALOPERIDOL 2 MG PO TABS
2.0000 mg | ORAL_TABLET | Freq: Every day | ORAL | Status: DC
  Administered 2016-03-04 – 2016-03-05 (×2): 2 mg via ORAL
  Filled 2016-03-03 (×4): qty 1

## 2016-03-03 MED ORDER — CALCITRIOL 0.5 MCG PO CAPS
0.7500 ug | ORAL_CAPSULE | Freq: Every day | ORAL | Status: DC
  Administered 2016-03-04 – 2016-03-06 (×3): 0.75 ug via ORAL
  Filled 2016-03-03 (×3): qty 1

## 2016-03-03 MED ORDER — ACETAMINOPHEN 325 MG PO TABS: 650.0000 mg | ORAL_TABLET | Freq: Four times a day (QID) | ORAL | Status: DC | PRN

## 2016-03-03 MED ORDER — CEFTRIAXONE SODIUM 1 G IJ SOLR
1.0000 g | Freq: Once | INTRAMUSCULAR | Status: AC
  Administered 2016-03-03: 1 g via INTRAVENOUS
  Filled 2016-03-03: qty 10

## 2016-03-03 MED ORDER — DEXTROSE 5 % IV SOLN
500.0000 mg | Freq: Once | INTRAVENOUS | Status: AC
  Administered 2016-03-03: 500 mg via INTRAVENOUS
  Filled 2016-03-03: qty 500

## 2016-03-03 MED ORDER — SODIUM CHLORIDE 0.9% FLUSH
3.0000 mL | Freq: Two times a day (BID) | INTRAVENOUS | Status: DC
  Administered 2016-03-04 – 2016-03-06 (×5): 3 mL via INTRAVENOUS

## 2016-03-03 MED ORDER — CALCIUM CARBONATE ANTACID 500 MG PO CHEW
2.0000 | CHEWABLE_TABLET | Freq: Three times a day (TID) | ORAL | Status: DC
  Administered 2016-03-04 – 2016-03-06 (×6): 400 mg via ORAL
  Filled 2016-03-03 (×7): qty 2

## 2016-03-03 MED ORDER — VITAMIN D 1000 UNITS PO TABS
2000.0000 [IU] | ORAL_TABLET | Freq: Every day | ORAL | Status: DC
  Administered 2016-03-04: 2000 [IU] via ORAL
  Filled 2016-03-03: qty 2

## 2016-03-03 MED ORDER — ONDANSETRON HCL 4 MG PO TABS: 4.0000 mg | ORAL_TABLET | Freq: Four times a day (QID) | ORAL | Status: DC | PRN

## 2016-03-03 MED ORDER — POLYETHYLENE GLYCOL 3350 17 G PO PACK: 17.0000 g | PACK | Freq: Every day | ORAL | Status: DC | PRN

## 2016-03-03 MED ORDER — SODIUM CHLORIDE 0.9% FLUSH
3.0000 mL | INTRAVENOUS | Status: DC | PRN
  Administered 2016-03-04: 3 mL via INTRAVENOUS
  Filled 2016-03-03: qty 3

## 2016-03-03 MED ORDER — ONDANSETRON HCL 4 MG/2ML IJ SOLN: 4.0000 mg | Freq: Four times a day (QID) | INTRAMUSCULAR | Status: DC | PRN
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
## 2016-03-03 NOTE — ED Notes (Signed)
Patient placed on 3L O2 Darren Dunn d/t SpO2 decreasing to mid 80s RA.

## 2016-03-03 NOTE — ED Triage Notes (Signed)
Pt is a PD patient from home that was exchanged yesterday. Pt family states that he is getting weaker and weaker especially in the last few hours. 88% on RA on arrival. Brought in by [facility]

## 2016-03-03 NOTE — ED Notes (Signed)
Admitting team at bedside.

## 2016-03-03 NOTE — ED Notes (Signed)
Per family, pt no longer produces urine

## 2016-03-04 ENCOUNTER — Observation Stay (HOSPITAL_COMMUNITY): Payer: Medicare Other

## 2016-03-04 ENCOUNTER — Encounter (HOSPITAL_COMMUNITY): Payer: Self-pay | Admitting: General Practice

## 2016-03-04 DIAGNOSIS — J189 Pneumonia, unspecified organism: Secondary | ICD-10-CM | POA: Diagnosis not present

## 2016-03-04 DIAGNOSIS — F17220 Nicotine dependence, chewing tobacco, uncomplicated: Secondary | ICD-10-CM | POA: Diagnosis present

## 2016-03-04 DIAGNOSIS — K900 Celiac disease: Secondary | ICD-10-CM | POA: Diagnosis present

## 2016-03-04 DIAGNOSIS — D631 Anemia in chronic kidney disease: Secondary | ICD-10-CM | POA: Diagnosis present

## 2016-03-04 DIAGNOSIS — F319 Bipolar disorder, unspecified: Secondary | ICD-10-CM | POA: Diagnosis present

## 2016-03-04 DIAGNOSIS — Z992 Dependence on renal dialysis: Secondary | ICD-10-CM | POA: Diagnosis not present

## 2016-03-04 DIAGNOSIS — G40909 Epilepsy, unspecified, not intractable, without status epilepticus: Secondary | ICD-10-CM | POA: Diagnosis present

## 2016-03-04 DIAGNOSIS — J159 Unspecified bacterial pneumonia: Secondary | ICD-10-CM

## 2016-03-04 DIAGNOSIS — N186 End stage renal disease: Secondary | ICD-10-CM | POA: Diagnosis not present

## 2016-03-04 DIAGNOSIS — N2581 Secondary hyperparathyroidism of renal origin: Secondary | ICD-10-CM | POA: Diagnosis present

## 2016-03-04 DIAGNOSIS — J129 Viral pneumonia, unspecified: Secondary | ICD-10-CM | POA: Diagnosis present

## 2016-03-04 DIAGNOSIS — Z79899 Other long term (current) drug therapy: Secondary | ICD-10-CM | POA: Diagnosis not present

## 2016-03-04 DIAGNOSIS — E440 Moderate protein-calorie malnutrition: Secondary | ICD-10-CM | POA: Diagnosis not present

## 2016-03-04 DIAGNOSIS — J111 Influenza due to unidentified influenza virus with other respiratory manifestations: Secondary | ICD-10-CM

## 2016-03-04 DIAGNOSIS — R4182 Altered mental status, unspecified: Secondary | ICD-10-CM

## 2016-03-04 DIAGNOSIS — E876 Hypokalemia: Secondary | ICD-10-CM | POA: Diagnosis present

## 2016-03-04 DIAGNOSIS — A419 Sepsis, unspecified organism: Secondary | ICD-10-CM | POA: Diagnosis present

## 2016-03-04 DIAGNOSIS — R17 Unspecified jaundice: Secondary | ICD-10-CM | POA: Diagnosis not present

## 2016-03-04 DIAGNOSIS — I120 Hypertensive chronic kidney disease with stage 5 chronic kidney disease or end stage renal disease: Secondary | ICD-10-CM | POA: Diagnosis present

## 2016-03-04 DIAGNOSIS — Z915 Personal history of self-harm: Secondary | ICD-10-CM | POA: Diagnosis not present

## 2016-03-04 DIAGNOSIS — N185 Chronic kidney disease, stage 5: Secondary | ICD-10-CM | POA: Diagnosis present

## 2016-03-04 DIAGNOSIS — D630 Anemia in neoplastic disease: Secondary | ICD-10-CM | POA: Diagnosis not present

## 2016-03-04 DIAGNOSIS — E872 Acidosis: Secondary | ICD-10-CM | POA: Diagnosis present

## 2016-03-04 DIAGNOSIS — Z8249 Family history of ischemic heart disease and other diseases of the circulatory system: Secondary | ICD-10-CM | POA: Diagnosis not present

## 2016-03-04 DIAGNOSIS — J9601 Acute respiratory failure with hypoxia: Secondary | ICD-10-CM | POA: Diagnosis present

## 2016-03-04 DIAGNOSIS — E039 Hypothyroidism, unspecified: Secondary | ICD-10-CM | POA: Diagnosis present

## 2016-03-04 LAB — CBC
HCT: 26.4 % — ABNORMAL LOW (ref 39.0–52.0)
HEMOGLOBIN: 8.8 g/dL — AB (ref 13.0–17.0)
MCH: 30.4 pg (ref 26.0–34.0)
MCHC: 33.3 g/dL (ref 30.0–36.0)
MCV: 91.3 fL (ref 78.0–100.0)
PLATELETS: 168 10*3/uL (ref 150–400)
RBC: 2.89 MIL/uL — AB (ref 4.22–5.81)
RDW: 16.4 % — ABNORMAL HIGH (ref 11.5–15.5)
WBC: 14.4 10*3/uL — ABNORMAL HIGH (ref 4.0–10.5)

## 2016-03-04 LAB — RENAL FUNCTION PANEL
ALBUMIN: 1.8 g/dL — AB (ref 3.5–5.0)
Anion gap: 17 — ABNORMAL HIGH (ref 5–15)
BUN: 61 mg/dL — AB (ref 6–20)
CHLORIDE: 86 mmol/L — AB (ref 101–111)
CO2: 25 mmol/L (ref 22–32)
CREATININE: 8.62 mg/dL — AB (ref 0.61–1.24)
Calcium: 8.1 mg/dL — ABNORMAL LOW (ref 8.9–10.3)
GFR, EST AFRICAN AMERICAN: 6 mL/min — AB (ref >60)
GFR, EST NON AFRICAN AMERICAN: 5 mL/min — AB (ref >60)
Glucose, Bld: 106 mg/dL — ABNORMAL HIGH (ref 65–99)
PHOSPHORUS: 5.4 mg/dL — AB (ref 2.5–4.6)
Potassium: 2.9 mmol/L — ABNORMAL LOW (ref 3.5–5.1)
Sodium: 128 mmol/L — ABNORMAL LOW (ref 135–145)

## 2016-03-04 LAB — TSH: TSH: 1.805 u[IU]/mL (ref 0.350–4.500)

## 2016-03-04 LAB — LIPID PANEL
CHOLESTEROL: 150 mg/dL (ref 0–200)
HDL: 30 mg/dL — ABNORMAL LOW (ref >40)
LDL CALC: 80 mg/dL (ref 0–99)
TRIGLYCERIDES: 201 mg/dL — AB (ref <150)
Total CHOL/HDL Ratio: 5 RATIO
VLDL: 40 mg/dL (ref 0–40)

## 2016-03-04 LAB — PROTIME-INR
INR: 1.45
PROTHROMBIN TIME: 17.7 s — AB (ref 11.4–15.2)

## 2016-03-04 LAB — MRSA PCR SCREENING: MRSA BY PCR: NEGATIVE

## 2016-03-04 LAB — APTT: aPTT: 47 seconds — ABNORMAL HIGH (ref 24–36)

## 2016-03-04 LAB — LACTIC ACID, PLASMA
LACTIC ACID, VENOUS: 1.1 mmol/L (ref 0.5–1.9)
LACTIC ACID, VENOUS: 1.2 mmol/L (ref 0.5–1.9)

## 2016-03-04 LAB — GLUCOSE, CAPILLARY: GLUCOSE-CAPILLARY: 84 mg/dL (ref 65–99)

## 2016-03-04 LAB — VALPROIC ACID LEVEL: Valproic Acid Lvl: 31 ug/mL — ABNORMAL LOW (ref 50.0–100.0)

## 2016-03-04 LAB — ETHANOL

## 2016-03-04 MED ORDER — DIVALPROEX SODIUM 250 MG PO DR TAB
250.0000 mg | DELAYED_RELEASE_TABLET | Freq: Every day | ORAL | Status: DC
  Administered 2016-03-04 – 2016-03-06 (×3): 250 mg via ORAL
  Filled 2016-03-04 (×4): qty 1

## 2016-03-04 MED ORDER — GENTAMICIN SULFATE 0.1 % EX CREA
1.0000 "application " | TOPICAL_CREAM | Freq: Every day | CUTANEOUS | Status: DC
  Administered 2016-03-04 – 2016-03-05 (×2): 1 via TOPICAL
  Filled 2016-03-04: qty 15

## 2016-03-04 MED ORDER — NEPRO/CARBSTEADY PO LIQD
237.0000 mL | Freq: Two times a day (BID) | ORAL | Status: DC
  Administered 2016-03-04 – 2016-03-05 (×2): 237 mL via ORAL

## 2016-03-04 MED ORDER — HEPARIN SODIUM (PORCINE) 5000 UNIT/ML IJ SOLN
5000.0000 [IU] | Freq: Three times a day (TID) | INTRAMUSCULAR | Status: DC
  Administered 2016-03-04 – 2016-03-06 (×8): 5000 [IU] via SUBCUTANEOUS
  Filled 2016-03-04 (×6): qty 1

## 2016-03-04 MED ORDER — PIPERACILLIN-TAZOBACTAM IN DEX 2-0.25 GM/50ML IV SOLN
2.2500 g | Freq: Two times a day (BID) | INTRAVENOUS | Status: DC
  Administered 2016-03-04 (×2): 2.25 g via INTRAVENOUS
  Filled 2016-03-04 (×3): qty 50

## 2016-03-04 MED ORDER — PRO-STAT SUGAR FREE PO LIQD
30.0000 mL | Freq: Two times a day (BID) | ORAL | Status: DC
  Administered 2016-03-04 – 2016-03-06 (×5): 30 mL via ORAL
  Filled 2016-03-04 (×5): qty 30

## 2016-03-04 MED ORDER — DARBEPOETIN ALFA 200 MCG/0.4ML IJ SOSY
200.0000 ug | PREFILLED_SYRINGE | INTRAMUSCULAR | Status: DC
  Administered 2016-03-05: 200 ug via SUBCUTANEOUS
  Filled 2016-03-04 (×2): qty 0.4

## 2016-03-04 MED ORDER — POTASSIUM CHLORIDE CRYS ER 20 MEQ PO TBCR
40.0000 meq | EXTENDED_RELEASE_TABLET | Freq: Once | ORAL | Status: AC
  Administered 2016-03-04: 40 meq via ORAL
  Filled 2016-03-04: qty 2

## 2016-03-04 MED ORDER — VANCOMYCIN HCL 10 G IV SOLR
1500.0000 mg | Freq: Once | INTRAVENOUS | Status: AC
  Administered 2016-03-04: 1500 mg via INTRAVENOUS
  Filled 2016-03-04: qty 1500

## 2016-03-04 MED ORDER — DARBEPOETIN ALFA 100 MCG/0.5ML IJ SOSY: 100.0000 ug | PREFILLED_SYRINGE | INTRAMUSCULAR | Status: DC
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
## 2016-03-04 NOTE — Progress Notes (Signed)
Pharmacy Antibiotic Note  Darren Dunn is a 71 y.o. male on peritoneal dialysis (anuric) admitted on 03/03/2016 with suspected CAP in the setting of flu s/p course of tamiflu.  He was empirically started on CTX and azithromycin, however pharmacy has now been consulted to dose vancomycin and zosyn.  WBC elevated at 14.4, Tmax 100.8, LA 1.1, weight 60.5 kg  Plan: Vancomycin 1500 mg IV x1 Check vancomycin random level in 3-4 days and re-dose once level <20 Zosyn 2.25 mg IV q12h Follow up LOT, culture data, clinical picture, Tmax, CBC Follow up nephrology plans regarding continuation of PD vs HD as will determine dosing strategy  Height: 5\' 9"  (175.3 cm) Weight: 133 lb 6.4 oz (60.5 kg) IBW/kg (Calculated) : 70.7  Temp (24hrs), Avg:99.2 F (37.3 C), Min:97.9 F (36.6 C), Max:100.8 F (38.2 C)   Recent Labs Lab 02/26/16 2328 02/26/16 2341 02/27/16 1019 03/03/16 1955 03/03/16 2041 03/04/16 0000 03/04/16 0208  WBC 12.5*  --  11.0* 13.8*  --   --  14.4*  CREATININE 9.83*  --  10.25* 8.52*  --   --  8.62*  LATICACIDVEN  --  1.15  --   --  2.75* 1.2 1.1    Estimated Creatinine Clearance: 6.8 mL/min (by C-G formula based on SCr of 8.62 mg/dL (H)).    Allergies  Allergen Reactions  . Sulfa Antibiotics Other (See Comments)    Bruises.    Antimicrobials this admission: CTX 2/3 >> 2/4 Azithromycin 2/3 >> 2/4 Vancomycin 2/4 >> Zosyn 2/4 >>  Dose adjustments this admission: NA  Microbiology results: 2/3 BCx: sent 1/28 BCx: ngtd   Thank you for allowing pharmacy to be a part of this patient's care.  Carlean Jews, Pharm.D. PGY1 Pharmacy Resident 2/4/20187:51 AM Pager [phone]
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
## 2016-03-05 DIAGNOSIS — D631 Anemia in chronic kidney disease: Secondary | ICD-10-CM | POA: Diagnosis not present

## 2016-03-05 DIAGNOSIS — N2581 Secondary hyperparathyroidism of renal origin: Secondary | ICD-10-CM | POA: Diagnosis not present

## 2016-03-05 DIAGNOSIS — Z79899 Other long term (current) drug therapy: Secondary | ICD-10-CM | POA: Diagnosis not present

## 2016-03-05 DIAGNOSIS — N186 End stage renal disease: Secondary | ICD-10-CM

## 2016-03-05 DIAGNOSIS — R17 Unspecified jaundice: Secondary | ICD-10-CM | POA: Diagnosis not present

## 2016-03-05 DIAGNOSIS — Z992 Dependence on renal dialysis: Secondary | ICD-10-CM

## 2016-03-05 DIAGNOSIS — D630 Anemia in neoplastic disease: Secondary | ICD-10-CM | POA: Diagnosis not present

## 2016-03-05 DIAGNOSIS — E440 Moderate protein-calorie malnutrition: Secondary | ICD-10-CM

## 2016-03-05 LAB — CBC
HEMATOCRIT: 21.7 % — AB (ref 39.0–52.0)
Hemoglobin: 7.3 g/dL — ABNORMAL LOW (ref 13.0–17.0)
MCH: 30.8 pg (ref 26.0–34.0)
MCHC: 33.6 g/dL (ref 30.0–36.0)
MCV: 91.6 fL (ref 78.0–100.0)
Platelets: 177 10*3/uL (ref 150–400)
RBC: 2.37 MIL/uL — ABNORMAL LOW (ref 4.22–5.81)
RDW: 16.9 % — AB (ref 11.5–15.5)
WBC: 14 10*3/uL — ABNORMAL HIGH (ref 4.0–10.5)

## 2016-03-05 LAB — RENAL FUNCTION PANEL
Albumin: 1.7 g/dL — ABNORMAL LOW (ref 3.5–5.0)
Anion gap: 13 (ref 5–15)
BUN: 69 mg/dL — AB (ref 6–20)
CO2: 24 mmol/L (ref 22–32)
Calcium: 8.3 mg/dL — ABNORMAL LOW (ref 8.9–10.3)
Chloride: 90 mmol/L — ABNORMAL LOW (ref 101–111)
Creatinine, Ser: 8.88 mg/dL — ABNORMAL HIGH (ref 0.61–1.24)
GFR calc Af Amer: 6 mL/min — ABNORMAL LOW (ref >60)
GFR calc non Af Amer: 5 mL/min — ABNORMAL LOW (ref >60)
GLUCOSE: 113 mg/dL — AB (ref 65–99)
PHOSPHORUS: 3.9 mg/dL (ref 2.5–4.6)
Potassium: 3.2 mmol/L — ABNORMAL LOW (ref 3.5–5.1)
SODIUM: 127 mmol/L — AB (ref 135–145)

## 2016-03-05 LAB — OCCULT BLOOD X 1 CARD TO LAB, STOOL: Fecal Occult Bld: NEGATIVE

## 2016-03-05 LAB — HEMOGLOBIN A1C
Hgb A1c MFr Bld: 5.1 % (ref 4.8–5.6)
MEAN PLASMA GLUCOSE: 100 mg/dL

## 2016-03-05 LAB — GLUCOSE, CAPILLARY: Glucose-Capillary: 111 mg/dL — ABNORMAL HIGH (ref 65–99)

## 2016-03-05 MED ORDER — DOXYCYCLINE HYCLATE 100 MG PO TABS
100.0000 mg | ORAL_TABLET | Freq: Two times a day (BID) | ORAL | Status: DC
  Administered 2016-03-05 – 2016-03-06 (×3): 100 mg via ORAL
  Filled 2016-03-05 (×3): qty 1

## 2016-03-05 MED ORDER — BOOST / RESOURCE BREEZE PO LIQD
1.0000 | Freq: Every day | ORAL | Status: DC
  Administered 2016-03-05 – 2016-03-06 (×2): 1 via ORAL

## 2016-03-05 MED ORDER — POTASSIUM CHLORIDE CRYS ER 20 MEQ PO TBCR
40.0000 meq | EXTENDED_RELEASE_TABLET | Freq: Two times a day (BID) | ORAL | Status: AC
  Administered 2016-03-05 (×2): 40 meq via ORAL
  Filled 2016-03-05: qty 2
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
## 2016-03-05 NOTE — Care Management Important Message (Signed)
Important Message  Patient Details  Name: BERKELEY VANAKEN MRN: [id number] Date of Birth: [birth year]   Medicare Important Message Given:  Yes    Sharin Mons, RN 03/05/2016, 10:22 AM
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
## 2016-03-06 ENCOUNTER — Inpatient Hospital Stay (HOSPITAL_COMMUNITY): Payer: Medicare Other

## 2016-03-06 DIAGNOSIS — R17 Unspecified jaundice: Secondary | ICD-10-CM | POA: Diagnosis not present

## 2016-03-06 DIAGNOSIS — D631 Anemia in chronic kidney disease: Secondary | ICD-10-CM | POA: Diagnosis not present

## 2016-03-06 DIAGNOSIS — N186 End stage renal disease: Secondary | ICD-10-CM | POA: Diagnosis not present

## 2016-03-06 DIAGNOSIS — N2581 Secondary hyperparathyroidism of renal origin: Secondary | ICD-10-CM | POA: Diagnosis not present

## 2016-03-06 DIAGNOSIS — Z79899 Other long term (current) drug therapy: Secondary | ICD-10-CM | POA: Diagnosis not present

## 2016-03-06 DIAGNOSIS — D630 Anemia in neoplastic disease: Secondary | ICD-10-CM | POA: Diagnosis not present

## 2016-03-06 LAB — CBC
HCT: 26.2 % — ABNORMAL LOW (ref 39.0–52.0)
Hemoglobin: 8.7 g/dL — ABNORMAL LOW (ref 13.0–17.0)
MCH: 30.5 pg (ref 26.0–34.0)
MCHC: 33.2 g/dL (ref 30.0–36.0)
MCV: 91.9 fL (ref 78.0–100.0)
Platelets: 202 10*3/uL (ref 150–400)
RBC: 2.85 MIL/uL — ABNORMAL LOW (ref 4.22–5.81)
RDW: 16.7 % — ABNORMAL HIGH (ref 11.5–15.5)
WBC: 14.1 10*3/uL — ABNORMAL HIGH (ref 4.0–10.5)

## 2016-03-06 LAB — RENAL FUNCTION PANEL
Albumin: 1.8 g/dL — ABNORMAL LOW (ref 3.5–5.0)
Anion gap: 16 — ABNORMAL HIGH (ref 5–15)
BUN: 59 mg/dL — ABNORMAL HIGH (ref 6–20)
CO2: 23 mmol/L (ref 22–32)
Calcium: 8.6 mg/dL — ABNORMAL LOW (ref 8.9–10.3)
Chloride: 91 mmol/L — ABNORMAL LOW (ref 101–111)
Creatinine, Ser: 8.37 mg/dL — ABNORMAL HIGH (ref 0.61–1.24)
GFR calc Af Amer: 7 mL/min — ABNORMAL LOW (ref >60)
GFR calc non Af Amer: 6 mL/min — ABNORMAL LOW (ref >60)
Glucose, Bld: 88 mg/dL (ref 65–99)
Phosphorus: 3.4 mg/dL (ref 2.5–4.6)
Potassium: 3.8 mmol/L (ref 3.5–5.1)
Sodium: 130 mmol/L — ABNORMAL LOW (ref 135–145)

## 2016-03-06 LAB — GLUCOSE, CAPILLARY: Glucose-Capillary: 92 mg/dL (ref 65–99)

## 2016-03-06 LAB — IRON AND TIBC
Iron: 95 ug/dL (ref 45–182)
Saturation Ratios: 62 % — ABNORMAL HIGH (ref 17.9–39.5)
TIBC: 153 ug/dL — ABNORMAL LOW (ref 250–450)
UIBC: 58 ug/dL

## 2016-03-06 MED ORDER — PRO-STAT SUGAR FREE PO LIQD: 30.0000 mL | Freq: Two times a day (BID) | ORAL | 0 refills | Status: DC

## 2016-03-06 MED ORDER — DOXYCYCLINE HYCLATE 100 MG PO TABS: 100.0000 mg | ORAL_TABLET | Freq: Two times a day (BID) | ORAL | 0 refills | Status: AC

## 2016-03-06 MED ORDER — POTASSIUM CHLORIDE CRYS ER 20 MEQ PO TBCR
40.0000 meq | EXTENDED_RELEASE_TABLET | Freq: Every day | ORAL | Status: AC
  Administered 2016-03-06: 40 meq via ORAL
  Filled 2016-03-06: qty 2

## 2016-03-06 MED ORDER — DARBEPOETIN ALFA 200 MCG/0.4ML IJ SOSY: 200.0000 ug | PREFILLED_SYRINGE | INTRAMUSCULAR | Status: DC

## 2016-03-06 MED ORDER — BOOST / RESOURCE BREEZE PO LIQD: 1.0000 | Freq: Every day | ORAL | 0 refills | Status: DC

## 2016-03-06 NOTE — Progress Notes (Signed)
Family Medicine Teaching Service Daily Progress Note Intern Pager: [phone]  Patient name: Darren Dunn Medical record number: [id number] Date of birth: [birth year] Age: 71 y.o. Gender: male  Primary Care Provider: Claretta Fraise, MD Consultants: Nephrology Code Status: Full  Pt Overview and Major Events to Date:  02/03: Admit for AMS and generalized weakness 02/04: Head CT negative, consulted nephrology for PD  Assessment and Plan: Darren Dunn is a 71 y.o. male presenting with altered mental status and generalized weakness in context of flu. 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  #Acute respiratory failure with hypoxia meeting sepsis criteria: Acute, improving. Patient meeting sepsis criteria with lactic acidosis and qSofa 2 on admission. Patient with new oxygen requirement on 2L. Patient tachycardic in 110s intermittent mild tachypnea. BP normotensive. Leukocytosis with WBC of 14.4 noted. Patient did not appear to be retaining CO2 with clear lungs on auscultation. Wet cough appreciated without sputum production. No wheezes appreciated. Recent influenza, completed Tamiflu. CXR c/w bibasilar atelectasis and appearance without consolidation or effusion. BCx NGTD. Unable to get UCx due to anuric status. Troponin negative with reassuring EKG. PE unlikely given Wells 1.5. Clinical picture consistent with decreased respiratory drive after recent flu. Treating for post-viral but likely viral. Repeat CXR c/w small effusions otherwise negative. --Will consult nephrology, appreciate recommendations --Switching IV Zosyn (day 2, 2/4) and  vancomycin (2/4), s/p azithro+CTX (2/3) to doxy today (day 2, 2/4-) --Cardiac monitoring --Incentive spirometry --Pulse ox with vital signs --O2 therapy to keep sat equal to or >92% --Daily CBC and renal function panel --Droplet  precaution  #Altered mental status with generalized weakness: Acute, improved. Patient alert and oriented 3 on admission. Was confused and slow cognitively to questioning. Generalized weakness appreciated without focal deficits. No slurring of speech or facial droop. Ammonia neg. Without hypoglycemia. Valproic Acid level low at 31. EtOH negative. TSH normal. Recently recovered from diarrhea and decreased oral intake. Suspect this is secondary to this and recent influenza. Unable to obtain UA given anuric status. Head CT negative for stroke. Patient now able to answer in complete sentences upon questioning. No neurological deficits appreciated. Hold off on MRI at this time. 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 Patient anuric for the past 60 days. Patient receives peritoneal dialysis at home with assistance by family nightly. Patient seen by [facility]. Family noted to recently decrease from 2.5% to 1.5% over past week given volume loss (diarrhea). Weight down from dry weight of 140 to 126 lbs. BUN 56. No basilar crackles or edema on exam. Patient does not appear to be in fluid overload. Lactic acid result. Nephrology following. --Will consult nephrology, appreciate recommendations --Nutrition consult pending --Daily weights --Strict I&O's  #Febrile tachycardia: Acute, resolved. HR 110s on admission with fever 100.54F. Likely related to flu and recent volume loss. Unlikely PE as stated above. Initial EKG sinus tachycardia with negative troponin. Unlikely ACS without chest pain. Possible CAP which we are treating for. Holding fluids given PD status as stated above. --See above for treatment of suspected CAP with flu --EKG 12-lead pending  #Hypertension: Chronic, stable.  Normotensive on admission. Takes losartan at home. --Risk stratification labs pending --Holding losartan 50 mg QD given normotensive BP  #Hypothyroidism: Chronic, stable. A1c controlled  at 5.1. Takes Synthroid at home. TSH normal at 1.805. --Synthroid 50 mcg QD  #Bipolar disorder: Chronic, stable. Takes Depakote and Haldol at home. Valproic Acid level normal at 31. --Haldol 2 mg QHS --Depakote 250-500 mg with 1 tab in AM and 2 tab in PM  #History of suicide attempt by firearm: Occurred in 2008 s/p facial surgical reconstruction. Unable to assess depression or suicidal or homicidal thoughts on admission given altered mental status. Family noted patient without feelings for quite some time. --See bipolar history above --Given AMS will need to follow-up as mentation improves  #Anemia: Chronic, stable. Hemoglobin 9.3 on admission. Baseline approximately 11, however CKG appears to be worsening over past year. Anemia likely secondary to chronic kidney disease. Patient with history of diarrhea without blood in stool. --Daily CBC  FEN/GI: Thin liquid diet, SLIV Prophylaxis: Heparin SQ  Disposition: Pending improvement of mentation and work up for generalized weakness.  Subjective:  Patient overall feels unchanged from yesterday. Denies any complaints this morning. Says cough has improved since yesterday.   Objective: Temp:  [98.3 F (36.8 C)-98.5 F (36.9 C)] 98.3 F (36.8 C) (02/06 0442) Pulse Rate:  [85-88] 88 (02/06 0442) Resp:  [14-19] 14 (02/06 0442) BP: (122-151)/(66-78) 122/68 (02/06 0442) SpO2:  [93 %-99 %] 97 % (02/06 0442) Weight:  [137 lb (62.1 kg)] 137 lb (62.1 kg) (02/05 2222) Physical Exam: General: frail elderly male laying in bed, well nourished, well developed, in no acute distress with non-toxic appearance HEENT: normocephalic, atraumatic, moist mucous membranes, PERRLA, EOMI, dentures CV: regular rate and rhythm without murmurs, rubs, or gallops, no  edema Lungs: clear to auscultation bilaterally with normal work of breathing on 2 L [place], with cough present Abdomen: soft, non-tender, normoactive bowel sounds, PD site intact without erythema Skin: warm, dry, no rashes or lesions, cap refill < 2 seconds Extremities: warm and well perfused, normal tone, generalized 5/5 motor strength all 4 extremities  Psych: alert and oriented x3, flat affect, tardive dyskinsia appreciated  Laboratory:  Recent Labs Lab 03/04/16 0208 03/05/16 0520 03/06/16 0846  WBC 14.4* 14.0* 14.1*  HGB 8.8* 7.3* 8.7*  HCT 26.4* 21.7* 26.2*  PLT 168 177 202    Recent Labs Lab 03/03/16 1955 03/04/16 0208 03/05/16 0520 03/06/16 0846  NA 128* 128* 127* 130*  K 3.2* 2.9* 3.2* 3.8  CL 87* 86* 90* 91*  CO2 22 25 24 23   BUN 56* 61* 69* 59*  CREATININE 8.52* 8.62* 8.88* 8.37*  CALCIUM 8.3* 8.1* 8.3* 8.6*  PROT 5.6*  --   --   --   BILITOT 1.0  --   --   --   ALKPHOS 69  --   --   --   ALT 29  --   --   --   AST 44*  --   --   --   GLUCOSE 109* 106* 113* 88   Lipid Panel     Component Value Date/Time   CHOL 150 03/04/2016 0045   TRIG 201 (H) 03/04/2016 0045   HDL 30 (L) 03/04/2016 0045   CHOLHDL 5.0 03/04/2016 0045   VLDL 40 03/04/2016 0045   LDLCALC 80 03/04/2016 0045   I-STAT troponin: Negative Lactic acid: 2.75>1.2>1.1 Ammonia: 19 (wnl) Blood cultures: Pending 2 Valproic acid level: 31 (L) TSH: 1.805 (wnl) A1c: Pending EtOH: Negative  Imaging/Diagnostic Tests: CT Head Wo Contrast (03/04/2016) FINDINGS: BRAIN: Moderate to severe ventriculomegaly on the basis of global parenchymal brain volume loss. No intraparenchymal hemorrhage, mass effect  nor midline shift. Patchy supratentorial white matter hypodensities within normal range for patient's age, though non-specific are most compatible with chronic small vessel ischemic disease. No acute large vascular territory infarcts. No abnormal extra-axial fluid collections. Basal cisterns are  patent.  VASCULAR: Mild calcific atherosclerosis of the carotid siphons.  SKULL: No skull fracture. No significant scalp soft tissue swelling.  SINUSES/ORBITS: Old LEFT facial fractures, status post ORIF. Chronic frontal sinusitis. Mastoid air cells are well aerated. Status post bilateral ocular lens implants. The included ocular globes and orbital contents are non-suspicious.  OTHER: None.  IMPRESSION: No acute intracranial process.  Moderate to severe parenchymal brain volume loss for age. Mild chronic small vessel ischemic disease.  DG Chest Port 1 View (03/03/2016) FINDINGS: Right jugular central catheter extends into the right atrium. Mild linear lung base opacities are probably atelectatic, accentuated by a shallow degree of inspiration. No pleural effusions. Slight prominence of the pulmonary vasculature. Hilar and mediastinal contours are unremarkable and unchanged.  IMPRESSION: Mild atelectatic appearing lung base opacities, accentuated by a shallow inspiration. No confluent consolidation. No significant effusion.    [place] Bing, DO 03/06/2016, 10:00 AM PGY-1, [facility] Intern pager: [phone], text pages welcome

## 2016-03-06 NOTE — Progress Notes (Signed)
Patient discharge teaching given, including activity, diet, follow-up appoints, and medications. Patient verbalized understanding of all discharge instructions. IV access was d/c'd. Vitals are stable. Skin is intact except as charted in most recent assessments. Pt to be escorted out by NT, to be driven home by family.  Juleon Narang, MBA, BSN, RN 

## 2016-03-06 NOTE — Discharge Instructions (Signed)
Darren Dunn was admitted for a recent flu infection causing him to need oxygen and leading to altered mental status prompting his admission. Overall, his mentation significantly improved over the course of his stay and he appears to be near or at baseline on discharge. Recommendations by physical and occupational therapy include home health physical therapy with a 3 and 1 bedside commode. Nutrition recommended supplementing with Boost Breeze once daily, and to continue 30 ml Prostat po BID. I have provided a 30 day supply of this on discharge. Patient received PD by nephrology during admission and did not have recommendations upon discharge other than to continue PD as before. Because Darren Dunn had the flu and an oxygen requirement, I prophylactically started antibiotics which she will continue doxycycline 100 mg twice a day for 5 more doses (see below for details). Please schedule a hospital follow-up with your PCP upon discharge.  Doxycycline tablets or capsules What is this medicine? DOXYCYCLINE (dox i SYE kleen) is a tetracycline antibiotic. It kills certain bacteria or stops their growth. It is used to treat many kinds of infections, like dental, skin, respiratory, and urinary tract infections. It also treats acne, Lyme disease, malaria, and certain sexually transmitted infections. This medicine may be used for other purposes; ask your health care provider or pharmacist if you have questions. COMMON BRAND NAME(S): Acticlate, Adoxa, Adoxa CK, Adoxa Pak, Adoxa TT, Alodox, Avidoxy, Doxal, Mondoxyne NL, Monodox, Morgidox 1x, Morgidox 1x Kit, Morgidox 2x, Morgidox 2x Kit, NutriDox, Ocudox, TARGADOX, Vibra-Tabs, Vibramycin What should I tell my health care provider before I take this medicine? They need to know if you have any of these conditions: -liver disease -long exposure to sunlight like working outdoors -stomach problems like colitis -an unusual or allergic reaction to doxycycline,  tetracycline antibiotics, other medicines, foods, dyes, or preservatives -pregnant or trying to get pregnant -breast-feeding How should I use this medicine? Take this medicine by mouth with a full glass of water. Follow the directions on the prescription label. It is best to take this medicine without food, but if it upsets your stomach take it with food. Take your medicine at regular intervals. Do not take your medicine more often than directed. Take all of your medicine as directed even if you think you are better. Do not skip doses or stop your medicine early. Talk to your pediatrician regarding the use of this medicine in children. While this drug may be prescribed for selected conditions, precautions do apply. Overdosage: If you think you have taken too much of this medicine contact a poison control center or emergency room at once. NOTE: This medicine is only for you. Do not share this medicine with others. What if I miss a dose? If you miss a dose, take it as soon as you can. If it is almost time for your next dose, take only that dose. Do not take double or extra doses. What may interact with this medicine? -antacids -barbiturates -birth control pills -bismuth subsalicylate -carbamazepine -methoxyflurane -other antibiotics -phenytoin -vitamins that contain iron -warfarin This list may not describe all possible interactions. Give your health care provider a list of all the medicines, herbs, non-prescription drugs, or dietary supplements you use. Also tell them if you smoke, drink alcohol, or use illegal drugs. Some items may interact with your medicine. What should I watch for while using this medicine? Tell your doctor or health care professional if your symptoms do not improve. Do not treat diarrhea with over the counter products. Contact your  doctor if you have diarrhea that lasts more than 2 days or if it is severe and watery. Do not take this medicine just before going to bed. It  may not dissolve properly when you lay down and can cause pain in your throat. Drink plenty of fluids while taking this medicine to also help reduce irritation in your throat. This medicine can make you more sensitive to the sun. Keep out of the sun. If you cannot avoid being in the sun, wear protective clothing and use sunscreen. Do not use sun lamps or tanning beds/booths. Birth control pills may not work properly while you are taking this medicine. Talk to your doctor about using an extra method of birth control. If you are being treated for a sexually transmitted infection, avoid sexual contact until you have finished your treatment. Your sexual partner may also need treatment. Avoid antacids, aluminum, calcium, magnesium, and iron products for 4 hours before and 2 hours after taking a dose of this medicine. If you are using this medicine to prevent malaria, you should still protect yourself from contact with mosquitos. Stay in screened-in areas, use mosquito nets, keep your body covered, and use an insect repellent. What side effects may I notice from receiving this medicine? Side effects that you should report to your doctor or health care professional as soon as possible: -allergic reactions like skin rash, itching or hives, swelling of the face, lips, or tongue -difficulty breathing -fever -itching in the rectal or genital area -pain on swallowing -redness, blistering, peeling or loosening of the skin, including inside the mouth -severe stomach pain or cramps -unusual bleeding or bruising -unusually weak or tired -yellowing of the eyes or skin Side effects that usually do not require medical attention (report to your doctor or health care professional if they continue or are bothersome): -diarrhea -loss of appetite -nausea, vomiting This list may not describe all possible side effects. Call your doctor for medical advice about side effects. You may report side effects to FDA at  1-800-FDA-1088. Where should I keep my medicine? Keep out of the reach of children. Store at room temperature, below 30 degrees C (86 degrees F). Protect from light. Keep container tightly closed. Throw away any unused medicine after the expiration date. Taking this medicine after the expiration date can make you seriously ill. NOTE: This sheet is a summary. It may not cover all possible information. If you have questions about this medicine, talk to your doctor, pharmacist, or health care provider.  2017 Elsevier/Gold Standard (2015-02-16 17:11:22)

## 2016-03-06 NOTE — Progress Notes (Signed)
Speech Language Pathology Treatment: Dysphagia  Patient Details Name: Darren Dunn MRN: [id number] DOB: July 14, 1945 Today's Date: 03/06/2016 Time: 4967-5916 SLP Time Calculation (min) (ACUTE ONLY): 20 min  Assessment / Plan / Recommendation Clinical Impression  In comparison to yesterday, observed a reduction in coughing prior to intake of POs. Resident was present before dysphagia treatment and stated that discharge is likely today. Pt was observed with thin liquids and regular solids that resulted in intermittent immediate and delayed coughing, especially noted after thin liquids. Advised that he participate today in the scheduled swallowing evaluation (MBS), but pt refused (stated he has had a lot of tests and has had chronic coughing) despite recommendation from SLP (explained that test can assess safety/efficiency of swallow functioning and rule out possible aspiration). Continue with diet of regular solids, thin liquids, meds whole with thin liquids. No further treatment at this time-ST signing off.    HPI HPI: Darren Dunn a 71 y.o.malepresenting with altered mental status and generalized weakness in context of flu. 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 Patient with recent positive influenza with completion of Tamiflu. CXR consistent with bibasilar atelectasis and appearance without consolidation or effusion, although ED physician interpreted as small infiltrate.       SLP Plan  Discharge SLP treatment due to (comment);Other (Comment) (pt discharging/refused MBS)     Recommendations  Diet recommendations: Regular;Thin liquid Liquids provided via: Cup;Straw Medication Administration: Whole meds with liquid Supervision: Intermittent supervision to cue for compensatory strategies;Patient able to self feed Compensations: Slow rate;Small sips/bites Postural  Changes and/or Swallow Maneuvers: Seated upright 90 degrees                Oral Care Recommendations: Oral care BID Follow up Recommendations: None Plan: Discharge SLP treatment due to (comment);Other (Comment) (pt discharging/refused MBS)       GO                [name] , Student-SLP 03/06/2016, 9:01 AM

## 2016-03-06 NOTE — Care Management Note (Addendum)
Case Management Note  Patient Details  Name: GUENTHER DUNSHEE MRN: [id number] Date of Birth: [birth year]  Subjective/Objective:   Presented with altered mental status and generalized weakness /PNA .          Action/Plan: Plan is to d/c today.  Expected Discharge Date:  03/06/16               Expected Discharge Plan:  [place]  In-House Referral:     Discharge planning Services  CM Consult  Post Acute Care Choice:    Choice offered to:  Patient, Adult Children     DME Arranged:   3 in 1 /[facility] DME Agency:    [place] Arranged:  PT, OT [facility] Agency:   [place]/ referral made with [facility] @ [phone].  Status of Service:  Completed, signed off  If discussed at [place] of Stay Meetings, dates discussed:    Additional Comments:  Sharin Mons, RN 03/06/2016, 4:51 PM

## 2016-03-06 NOTE — Progress Notes (Signed)
[facility] Progress Note   Dialysis Orders: CCPD at home  4 exchanges, 3L fill volume, dwell time 1:30h. No daytime exchanges. - No heparin or ESA recently.  Assessment/Plan: 1. Encephalopthy/fever with acute resp failure with hypoxia - BC pending /sats ok; changed to po doxycycline /droplet precautions 2. ESRD - CCPD K 3.2 Monday up to 3.8 with supplement K will continue 40 per day- add supple K today and repeat labs in am- diet liberalized to regular- Has been on for 5 years - moved to [place] from [facility] 3. Anemia - hgb up to 8.7 today ; SQ Aranesp 200 given 2/5-  Fe studies pending  FOBT neg 2/5 4. Secondary hyperparathyroidism - Ca 8.6 alb 1.8 P 3.4 - continue binders/calcitriol - follow labs 5. HTN/volume - BP up some , Na low - has been on all 1.5 s-change to half 2.5s tonight 6. Nutrition - very low alb -  Prostat/nepro/vits ordered - diet liberalized to regular; intake inadequate; for swallowing eval to be sure he is not aspirating per ST note yesterday 7. Influenza A 1/28 -s/p tamiflu 8. Seizure disorder 9. Bi polar disorder  Myriam Jacobson, PA-C [facility] [phone] 03/06/2016,9:38 AM  LOS: 2 days   Pt seen, examined and agree w A/P as above. Got up and walked by himself to the chair, slow shuffling gait, using bed and chair for support.  Stable medically, dispo per primary service.  Cont PD.  No vol issues.  Kelly Splinter MD [facility] pager [phone]   03/06/2016, 10:58 AM    Subjective:   No c/o, coughing some  Objective Vitals:   03/05/16 1000 03/05/16 1822 03/05/16 2222 03/06/16 0442  BP: 125/65 130/66 (!) 151/78 122/68  Pulse: 89 88 85 88  Resp: 18 19 16 14   Temp: 98.5 F (36.9 C) 98.5 F (36.9 C) 98.3 F (36.8 C) 98.3 F (36.8 C)  TempSrc: Oral Oral    SpO2: 99% 99% 93% 97%  Weight:   62.1 kg (137 lb)   Height:       Physical Exam General:sitting up in chari Heart: RRR Lungs:rhonchi on right right -  left clearer today Abdomen: soft NT Extremities: no LE edema but hands seem a little puffy Dialysis Access: PD cath   Additional Objective Labs: Basic Metabolic Panel:  Recent Labs Lab 03/04/16 0208 03/05/16 0520 03/06/16 0846  NA 128* 127* 130*  K 2.9* 3.2* 3.8  CL 86* 90* 91*  CO2 25 24 23   GLUCOSE 106* 113* 88  BUN 61* 69* 59*  CREATININE 8.62* 8.88* 8.37*  CALCIUM 8.1* 8.3* 8.6*  PHOS 5.4* 3.9 3.4   Liver Function Tests:  Recent Labs Lab 03/03/16 1955 03/04/16 0208 03/05/16 0520 03/06/16 0846  AST 44*  --   --   --   ALT 29  --   --   --   ALKPHOS 69  --   --   --   BILITOT 1.0  --   --   --   PROT 5.6*  --   --   --   ALBUMIN 2.1* 1.8* 1.7* 1.8*   No results for input(s): LIPASE, AMYLASE in the last 168 hours. CBC:  Recent Labs Lab 03/03/16 1955 03/04/16 0208 03/05/16 0520 03/06/16 0846  WBC 13.8* 14.4* 14.0* 14.1*  HGB 9.3* 8.8* 7.3* 8.7*  HCT 27.9* 26.4* 21.7* 26.2*  MCV 91.5 91.3 91.6 91.9  PLT 177 168 177 202   Blood Culture  Component Value Date/Time   SDES BLOOD LEFT ARM 03/03/2016 2155   SPECREQUEST BOTTLES DRAWN AEROBIC AND ANAEROBIC 5CC EA 03/03/2016 2155   CULT NO GROWTH 2 DAYS 03/03/2016 2155   REPTSTATUS PENDING 03/03/2016 2155    Cardiac Enzymes: No results for input(s): CKTOTAL, CKMB, CKMBINDEX, TROPONINI in the last 168 hours. CBG:  Recent Labs Lab 03/04/16 0819 03/05/16 0800 03/06/16 0744  GLUCAP 84 111* 92   Iron Studies: No results for input(s): IRON, TIBC, TRANSFERRIN, FERRITIN in the last 72 hours. Lab Results  Component Value Date   INR 1.45 03/03/2016   Studies/Results: Dg Chest 2 View  Result Date: 03/04/2016 CLINICAL DATA:  Pneumonia. EXAM: CHEST  2 VIEW COMPARISON:  03/03/2016 FINDINGS: The right IJ catheter is stable. The cardiac silhouette, mediastinal and hilar contours are unchanged. Tortuosity and calcification of the aorta is noted. Slight improved left basilar aeration an overall left lung  aeration. This could be resolving atelectasis or clearing pneumonia. Small bilateral pleural effusions. IMPRESSION: Improved left lung aeration likely clearing atelectasis or infiltrate. Small effusions. Electronically Signed   By: Marijo Sanes M.D.   On: 03/04/2016 13:27   Medications:  . calcitRIOL  0.75 mcg Oral Daily  . calcium carbonate  2 tablet Oral TID WC  . darbepoetin (ARANESP) injection - NON-DIALYSIS  200 mcg Subcutaneous Q Sun-1800  . divalproex  250 mg Oral Daily  . divalproex  500 mg Oral QHS  . doxycycline  100 mg Oral Q12H  . feeding supplement  1 Container Oral Q1500  . feeding supplement (PRO-STAT SUGAR FREE 64)  30 mL Oral BID  . gentamicin cream  1 application Topical Daily  . haloperidol  2 mg Oral QHS  . heparin  5,000 Units Subcutaneous Q8H  . levothyroxine  50 mcg Oral QAC breakfast  . multivitamin with minerals  1 tablet Oral Daily  . sodium chloride flush  3 mL Intravenous Q12H

## 2016-03-06 NOTE — Progress Notes (Signed)
SATURATION QUALIFICATIONS: (This note is used to comply with regulatory documentation for home oxygen)  Patient Saturations on Room Air at Rest = 95%  Patient Saturations on Room Air while Ambulating = 94%  Patient Saturations on N/A Liters of oxygen while Ambulating = N/A%  Please briefly explain why patient needs home oxygen: patient does not need O2.   Jillyn Ledger, MBA, BSN, RN

## 2016-03-06 NOTE — Progress Notes (Signed)
Transitions of Care Pharmacy Note  Plan:  Educated on doxycycline duration of therapy, aranesp, and holding losartan for now Addressed concerns regarding changes in medication therapy Follow-up restart of losartan if clinically appropriate --------------------------------------------- Darren Dunn is an 71 y.o. male who presents with a chief complaint PNA in setting of flu. In anticipation of discharge, pharmacy has reviewed this patient's prior to admission medication history, as well as current inpatient medications listed per the [facility].  Current medication indications, dosing, frequency, and notable side effects reviewed with patient. patient verbalized understanding of current inpatient medication regimen and is aware that the After Visit Summary when presented, will represent the most accurate medication list at discharge.   Darren Dunn expressed concerns regarding any changes in his medication list. These were addressed and patient was counseled on doxycycline and aranesp.  Informed patient that losartan has been on hold while inpatient has BP has been controlled without it.   Assessment: Understanding of regimen: fair Understanding of indications: fair Potential of compliance: good Barriers to Obtaining Medications: No  Patient instructed to contact inpatient pharmacy team with further questions or concerns if needed.    Time spent preparing for discharge counseling: 10 mins Time spent counseling patient: 10 mins   Thank you for allowing pharmacy to be a part of this patient's care.  Carlean Jews, Pharm.D. PGY1 Pharmacy Resident 2/6/20182:18 PM Pager [phone]

## 2016-03-06 NOTE — Progress Notes (Signed)
PT Cancellation Note  Patient Details Name: Darren Dunn MRN: [id number] DOB: 04-12-1945   Cancelled Treatment:    Reason Eval/Treat Not Completed: Other (comment) (Declined due to impending discharge today).  Pt is going home with son and will check again tomorrow if he is delayed leaving.   Ramond Dial 03/06/2016, 5:01 PM   Mee Hives, PT MS Acute Rehab Dept. Number: [place] [facility] [place]

## 2016-03-07 DIAGNOSIS — R17 Unspecified jaundice: Secondary | ICD-10-CM | POA: Diagnosis not present

## 2016-03-07 DIAGNOSIS — N2581 Secondary hyperparathyroidism of renal origin: Secondary | ICD-10-CM | POA: Diagnosis not present

## 2016-03-07 DIAGNOSIS — Z79899 Other long term (current) drug therapy: Secondary | ICD-10-CM | POA: Diagnosis not present

## 2016-03-07 DIAGNOSIS — D630 Anemia in neoplastic disease: Secondary | ICD-10-CM | POA: Diagnosis not present

## 2016-03-07 DIAGNOSIS — D631 Anemia in chronic kidney disease: Secondary | ICD-10-CM | POA: Diagnosis not present

## 2016-03-07 DIAGNOSIS — N186 End stage renal disease: Secondary | ICD-10-CM | POA: Diagnosis not present

## 2016-03-08 DIAGNOSIS — J9601 Acute respiratory failure with hypoxia: Secondary | ICD-10-CM | POA: Diagnosis not present

## 2016-03-08 DIAGNOSIS — R17 Unspecified jaundice: Secondary | ICD-10-CM | POA: Diagnosis not present

## 2016-03-08 DIAGNOSIS — D631 Anemia in chronic kidney disease: Secondary | ICD-10-CM | POA: Diagnosis not present

## 2016-03-08 DIAGNOSIS — N2581 Secondary hyperparathyroidism of renal origin: Secondary | ICD-10-CM | POA: Diagnosis not present

## 2016-03-08 DIAGNOSIS — Z79899 Other long term (current) drug therapy: Secondary | ICD-10-CM | POA: Diagnosis not present

## 2016-03-08 DIAGNOSIS — J189 Pneumonia, unspecified organism: Secondary | ICD-10-CM | POA: Diagnosis not present

## 2016-03-08 DIAGNOSIS — D630 Anemia in neoplastic disease: Secondary | ICD-10-CM | POA: Diagnosis not present

## 2016-03-08 DIAGNOSIS — R8299 Other abnormal findings in urine: Secondary | ICD-10-CM | POA: Diagnosis not present

## 2016-03-08 DIAGNOSIS — N186 End stage renal disease: Secondary | ICD-10-CM | POA: Diagnosis not present

## 2016-03-08 LAB — CULTURE, BLOOD (ROUTINE X 2)
CULTURE: NO GROWTH
Culture: NO GROWTH

## 2016-03-09 ENCOUNTER — Telehealth: Payer: Self-pay | Admitting: Family Medicine

## 2016-03-09 DIAGNOSIS — D631 Anemia in chronic kidney disease: Secondary | ICD-10-CM | POA: Diagnosis not present

## 2016-03-09 DIAGNOSIS — N2581 Secondary hyperparathyroidism of renal origin: Secondary | ICD-10-CM | POA: Diagnosis not present

## 2016-03-09 DIAGNOSIS — R17 Unspecified jaundice: Secondary | ICD-10-CM | POA: Diagnosis not present

## 2016-03-09 DIAGNOSIS — Z79899 Other long term (current) drug therapy: Secondary | ICD-10-CM | POA: Diagnosis not present

## 2016-03-09 DIAGNOSIS — N186 End stage renal disease: Secondary | ICD-10-CM | POA: Diagnosis not present

## 2016-03-09 DIAGNOSIS — D630 Anemia in neoplastic disease: Secondary | ICD-10-CM | POA: Diagnosis not present

## 2016-03-09 NOTE — Telephone Encounter (Signed)
noted 

## 2016-03-10 DIAGNOSIS — R17 Unspecified jaundice: Secondary | ICD-10-CM | POA: Diagnosis not present

## 2016-03-10 DIAGNOSIS — N2581 Secondary hyperparathyroidism of renal origin: Secondary | ICD-10-CM | POA: Diagnosis not present

## 2016-03-10 DIAGNOSIS — N186 End stage renal disease: Secondary | ICD-10-CM | POA: Diagnosis not present

## 2016-03-10 DIAGNOSIS — Z79899 Other long term (current) drug therapy: Secondary | ICD-10-CM | POA: Diagnosis not present

## 2016-03-10 DIAGNOSIS — D630 Anemia in neoplastic disease: Secondary | ICD-10-CM | POA: Diagnosis not present

## 2016-03-10 DIAGNOSIS — D631 Anemia in chronic kidney disease: Secondary | ICD-10-CM | POA: Diagnosis not present

## 2016-03-11 DIAGNOSIS — D630 Anemia in neoplastic disease: Secondary | ICD-10-CM | POA: Diagnosis not present

## 2016-03-11 DIAGNOSIS — N2581 Secondary hyperparathyroidism of renal origin: Secondary | ICD-10-CM | POA: Diagnosis not present

## 2016-03-11 DIAGNOSIS — Z79899 Other long term (current) drug therapy: Secondary | ICD-10-CM | POA: Diagnosis not present

## 2016-03-11 DIAGNOSIS — R17 Unspecified jaundice: Secondary | ICD-10-CM | POA: Diagnosis not present

## 2016-03-11 DIAGNOSIS — D631 Anemia in chronic kidney disease: Secondary | ICD-10-CM | POA: Diagnosis not present

## 2016-03-11 DIAGNOSIS — N186 End stage renal disease: Secondary | ICD-10-CM | POA: Diagnosis not present

## 2016-03-12 ENCOUNTER — Ambulatory Visit: Payer: Medicare Other | Admitting: Family Medicine

## 2016-03-12 ENCOUNTER — Ambulatory Visit
Admission: RE | Admit: 2016-03-12 | Discharge: 2016-03-12 | Disposition: A | Discharge status: Home / Self Care | Payer: Medicare Other | Source: Ambulatory Visit | Attending: Nephrology | Admitting: Nephrology

## 2016-03-12 ENCOUNTER — Other Ambulatory Visit: Payer: Self-pay | Admitting: Nephrology

## 2016-03-12 DIAGNOSIS — N2581 Secondary hyperparathyroidism of renal origin: Secondary | ICD-10-CM | POA: Diagnosis not present

## 2016-03-12 DIAGNOSIS — J189 Pneumonia, unspecified organism: Secondary | ICD-10-CM | POA: Diagnosis not present

## 2016-03-12 DIAGNOSIS — Z8709 Personal history of other diseases of the respiratory system: Secondary | ICD-10-CM

## 2016-03-12 DIAGNOSIS — J9601 Acute respiratory failure with hypoxia: Secondary | ICD-10-CM | POA: Diagnosis not present

## 2016-03-12 DIAGNOSIS — R17 Unspecified jaundice: Secondary | ICD-10-CM | POA: Diagnosis not present

## 2016-03-12 DIAGNOSIS — D630 Anemia in neoplastic disease: Secondary | ICD-10-CM | POA: Diagnosis not present

## 2016-03-12 DIAGNOSIS — R05 Cough: Secondary | ICD-10-CM | POA: Diagnosis not present

## 2016-03-12 DIAGNOSIS — D631 Anemia in chronic kidney disease: Secondary | ICD-10-CM | POA: Diagnosis not present

## 2016-03-12 DIAGNOSIS — N186 End stage renal disease: Secondary | ICD-10-CM | POA: Diagnosis not present

## 2016-03-12 DIAGNOSIS — Z79899 Other long term (current) drug therapy: Secondary | ICD-10-CM | POA: Diagnosis not present

## 2016-03-13 DIAGNOSIS — R17 Unspecified jaundice: Secondary | ICD-10-CM | POA: Diagnosis not present

## 2016-03-13 DIAGNOSIS — D631 Anemia in chronic kidney disease: Secondary | ICD-10-CM | POA: Diagnosis not present

## 2016-03-13 DIAGNOSIS — Z79899 Other long term (current) drug therapy: Secondary | ICD-10-CM | POA: Diagnosis not present

## 2016-03-13 DIAGNOSIS — N2581 Secondary hyperparathyroidism of renal origin: Secondary | ICD-10-CM | POA: Diagnosis not present

## 2016-03-13 DIAGNOSIS — D630 Anemia in neoplastic disease: Secondary | ICD-10-CM | POA: Diagnosis not present

## 2016-03-13 DIAGNOSIS — N186 End stage renal disease: Secondary | ICD-10-CM | POA: Diagnosis not present

## 2016-03-14 DIAGNOSIS — N2581 Secondary hyperparathyroidism of renal origin: Secondary | ICD-10-CM | POA: Diagnosis not present

## 2016-03-14 DIAGNOSIS — D631 Anemia in chronic kidney disease: Secondary | ICD-10-CM | POA: Diagnosis not present

## 2016-03-14 DIAGNOSIS — Z79899 Other long term (current) drug therapy: Secondary | ICD-10-CM | POA: Diagnosis not present

## 2016-03-14 DIAGNOSIS — D630 Anemia in neoplastic disease: Secondary | ICD-10-CM | POA: Diagnosis not present

## 2016-03-14 DIAGNOSIS — R17 Unspecified jaundice: Secondary | ICD-10-CM | POA: Diagnosis not present

## 2016-03-14 DIAGNOSIS — J9601 Acute respiratory failure with hypoxia: Secondary | ICD-10-CM | POA: Diagnosis not present

## 2016-03-14 DIAGNOSIS — N186 End stage renal disease: Secondary | ICD-10-CM | POA: Diagnosis not present

## 2016-03-14 DIAGNOSIS — J189 Pneumonia, unspecified organism: Secondary | ICD-10-CM | POA: Diagnosis not present

## 2016-03-15 ENCOUNTER — Ambulatory Visit: Payer: Medicare Other | Admitting: Family Medicine

## 2016-03-15 DIAGNOSIS — N2581 Secondary hyperparathyroidism of renal origin: Secondary | ICD-10-CM | POA: Diagnosis not present

## 2016-03-15 DIAGNOSIS — Z992 Dependence on renal dialysis: Secondary | ICD-10-CM | POA: Diagnosis not present

## 2016-03-15 DIAGNOSIS — I871 Compression of vein: Secondary | ICD-10-CM | POA: Diagnosis not present

## 2016-03-15 DIAGNOSIS — T82868A Thrombosis of vascular prosthetic devices, implants and grafts, initial encounter: Secondary | ICD-10-CM | POA: Diagnosis not present

## 2016-03-15 DIAGNOSIS — T82818A Embolism of vascular prosthetic devices, implants and grafts, initial encounter: Secondary | ICD-10-CM | POA: Diagnosis not present

## 2016-03-15 DIAGNOSIS — N186 End stage renal disease: Secondary | ICD-10-CM | POA: Diagnosis not present

## 2016-03-15 DIAGNOSIS — D630 Anemia in neoplastic disease: Secondary | ICD-10-CM | POA: Diagnosis not present

## 2016-03-15 DIAGNOSIS — Z79899 Other long term (current) drug therapy: Secondary | ICD-10-CM | POA: Diagnosis not present

## 2016-03-15 DIAGNOSIS — R17 Unspecified jaundice: Secondary | ICD-10-CM | POA: Diagnosis not present

## 2016-03-15 DIAGNOSIS — D631 Anemia in chronic kidney disease: Secondary | ICD-10-CM | POA: Diagnosis not present

## 2016-03-16 ENCOUNTER — Ambulatory Visit: Payer: Medicare Other | Admitting: Family Medicine

## 2016-03-16 ENCOUNTER — Ambulatory Visit (INDEPENDENT_AMBULATORY_CARE_PROVIDER_SITE_OTHER): Payer: Medicare Other | Admitting: Family Medicine

## 2016-03-16 ENCOUNTER — Encounter: Payer: Self-pay | Admitting: Family Medicine

## 2016-03-16 VITALS — BP 103/60 | HR 90 | Temp 97.1°F | Ht 69.0 in | Wt 138.0 lb

## 2016-03-16 DIAGNOSIS — J111 Influenza due to unidentified influenza virus with other respiratory manifestations: Secondary | ICD-10-CM

## 2016-03-16 DIAGNOSIS — N2581 Secondary hyperparathyroidism of renal origin: Secondary | ICD-10-CM | POA: Diagnosis not present

## 2016-03-16 DIAGNOSIS — N186 End stage renal disease: Secondary | ICD-10-CM

## 2016-03-16 DIAGNOSIS — E034 Atrophy of thyroid (acquired): Secondary | ICD-10-CM

## 2016-03-16 DIAGNOSIS — Z992 Dependence on renal dialysis: Secondary | ICD-10-CM | POA: Diagnosis not present

## 2016-03-16 DIAGNOSIS — D631 Anemia in chronic kidney disease: Secondary | ICD-10-CM | POA: Diagnosis not present

## 2016-03-16 DIAGNOSIS — D630 Anemia in neoplastic disease: Secondary | ICD-10-CM | POA: Diagnosis not present

## 2016-03-16 DIAGNOSIS — Z79899 Other long term (current) drug therapy: Secondary | ICD-10-CM | POA: Diagnosis not present

## 2016-03-16 DIAGNOSIS — R17 Unspecified jaundice: Secondary | ICD-10-CM | POA: Diagnosis not present
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
## 2016-03-17 DIAGNOSIS — R17 Unspecified jaundice: Secondary | ICD-10-CM | POA: Diagnosis not present

## 2016-03-17 DIAGNOSIS — D630 Anemia in neoplastic disease: Secondary | ICD-10-CM | POA: Diagnosis not present

## 2016-03-17 DIAGNOSIS — N186 End stage renal disease: Secondary | ICD-10-CM | POA: Diagnosis not present

## 2016-03-17 DIAGNOSIS — D631 Anemia in chronic kidney disease: Secondary | ICD-10-CM | POA: Diagnosis not present

## 2016-03-17 DIAGNOSIS — Z79899 Other long term (current) drug therapy: Secondary | ICD-10-CM | POA: Diagnosis not present

## 2016-03-17 DIAGNOSIS — N2581 Secondary hyperparathyroidism of renal origin: Secondary | ICD-10-CM | POA: Diagnosis not present

## 2016-03-18 DIAGNOSIS — R17 Unspecified jaundice: Secondary | ICD-10-CM | POA: Diagnosis not present

## 2016-03-18 DIAGNOSIS — D631 Anemia in chronic kidney disease: Secondary | ICD-10-CM | POA: Diagnosis not present

## 2016-03-18 DIAGNOSIS — N2581 Secondary hyperparathyroidism of renal origin: Secondary | ICD-10-CM | POA: Diagnosis not present

## 2016-03-18 DIAGNOSIS — N186 End stage renal disease: Secondary | ICD-10-CM | POA: Diagnosis not present

## 2016-03-18 DIAGNOSIS — Z79899 Other long term (current) drug therapy: Secondary | ICD-10-CM | POA: Diagnosis not present

## 2016-03-18 DIAGNOSIS — D630 Anemia in neoplastic disease: Secondary | ICD-10-CM | POA: Diagnosis not present

## 2016-03-19 ENCOUNTER — Telehealth: Payer: Self-pay | Admitting: *Deleted

## 2016-03-19 DIAGNOSIS — J9601 Acute respiratory failure with hypoxia: Secondary | ICD-10-CM | POA: Diagnosis not present

## 2016-03-19 DIAGNOSIS — N186 End stage renal disease: Secondary | ICD-10-CM | POA: Diagnosis not present

## 2016-03-19 DIAGNOSIS — D630 Anemia in neoplastic disease: Secondary | ICD-10-CM | POA: Diagnosis not present

## 2016-03-19 DIAGNOSIS — Z79899 Other long term (current) drug therapy: Secondary | ICD-10-CM | POA: Diagnosis not present

## 2016-03-19 DIAGNOSIS — D631 Anemia in chronic kidney disease: Secondary | ICD-10-CM | POA: Diagnosis not present

## 2016-03-19 DIAGNOSIS — R17 Unspecified jaundice: Secondary | ICD-10-CM | POA: Diagnosis not present

## 2016-03-19 DIAGNOSIS — N2581 Secondary hyperparathyroidism of renal origin: Secondary | ICD-10-CM | POA: Diagnosis not present

## 2016-03-19 DIAGNOSIS — J189 Pneumonia, unspecified organism: Secondary | ICD-10-CM | POA: Diagnosis not present

## 2016-03-19 LAB — CMP14+EGFR
A/G RATIO: 1.3 (ref 1.2–2.2)
ALK PHOS: 95 IU/L (ref 39–117)
ALT: 16 IU/L (ref 0–44)
AST: 24 IU/L (ref 0–40)
Albumin: 2.9 g/dL — ABNORMAL LOW (ref 3.5–4.8)
BILIRUBIN TOTAL: 0.3 mg/dL (ref 0.0–1.2)
BUN/Creatinine Ratio: 5 — ABNORMAL LOW (ref 10–24)
BUN: 41 mg/dL — ABNORMAL HIGH (ref 8–27)
CALCIUM: 7.9 mg/dL — AB (ref 8.6–10.2)
CHLORIDE: 84 mmol/L — AB (ref 96–106)
CO2: 20 mmol/L (ref 18–29)
Creatinine, Ser: 8.22 mg/dL (ref 0.76–1.27)
GFR calc Af Amer: 7 mL/min/{1.73_m2} — ABNORMAL LOW (ref >59)
GFR, EST NON AFRICAN AMERICAN: 6 mL/min/{1.73_m2} — AB (ref >59)
GLOBULIN, TOTAL: 2.3 g/dL (ref 1.5–4.5)
Glucose: 80 mg/dL (ref 65–99)
POTASSIUM: 4.9 mmol/L (ref 3.5–5.2)
SODIUM: 128 mmol/L — AB (ref 134–144)
Total Protein: 5.2 g/dL — ABNORMAL LOW (ref 6.0–8.5)
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
## 2016-03-19 NOTE — Telephone Encounter (Signed)
I saw him for this on 2/16. Only needs follow up if getting infected.

## 2016-03-20 DIAGNOSIS — N186 End stage renal disease: Secondary | ICD-10-CM | POA: Diagnosis not present

## 2016-03-20 DIAGNOSIS — R17 Unspecified jaundice: Secondary | ICD-10-CM | POA: Diagnosis not present

## 2016-03-20 DIAGNOSIS — D630 Anemia in neoplastic disease: Secondary | ICD-10-CM | POA: Diagnosis not present

## 2016-03-20 DIAGNOSIS — Z79899 Other long term (current) drug therapy: Secondary | ICD-10-CM | POA: Diagnosis not present

## 2016-03-20 DIAGNOSIS — N2581 Secondary hyperparathyroidism of renal origin: Secondary | ICD-10-CM | POA: Diagnosis not present

## 2016-03-20 DIAGNOSIS — D631 Anemia in chronic kidney disease: Secondary | ICD-10-CM | POA: Diagnosis not present

## 2016-03-20 NOTE — Telephone Encounter (Signed)
[facility] notified of reccomendation

## 2016-03-21 DIAGNOSIS — D630 Anemia in neoplastic disease: Secondary | ICD-10-CM | POA: Diagnosis not present

## 2016-03-21 DIAGNOSIS — N2581 Secondary hyperparathyroidism of renal origin: Secondary | ICD-10-CM | POA: Diagnosis not present

## 2016-03-21 DIAGNOSIS — D631 Anemia in chronic kidney disease: Secondary | ICD-10-CM | POA: Diagnosis not present

## 2016-03-21 DIAGNOSIS — Z79899 Other long term (current) drug therapy: Secondary | ICD-10-CM | POA: Diagnosis not present

## 2016-03-21 DIAGNOSIS — R17 Unspecified jaundice: Secondary | ICD-10-CM | POA: Diagnosis not present

## 2016-03-21 DIAGNOSIS — N186 End stage renal disease: Secondary | ICD-10-CM | POA: Diagnosis not present

## 2016-03-22 DIAGNOSIS — J9601 Acute respiratory failure with hypoxia: Secondary | ICD-10-CM | POA: Diagnosis not present

## 2016-03-22 DIAGNOSIS — N186 End stage renal disease: Secondary | ICD-10-CM | POA: Diagnosis not present

## 2016-03-22 DIAGNOSIS — N2581 Secondary hyperparathyroidism of renal origin: Secondary | ICD-10-CM | POA: Diagnosis not present

## 2016-03-22 DIAGNOSIS — R17 Unspecified jaundice: Secondary | ICD-10-CM | POA: Diagnosis not present

## 2016-03-22 DIAGNOSIS — J189 Pneumonia, unspecified organism: Secondary | ICD-10-CM | POA: Diagnosis not present

## 2016-03-22 DIAGNOSIS — D631 Anemia in chronic kidney disease: Secondary | ICD-10-CM | POA: Diagnosis not present

## 2016-03-22 DIAGNOSIS — Z79899 Other long term (current) drug therapy: Secondary | ICD-10-CM | POA: Diagnosis not present

## 2016-03-22 DIAGNOSIS — D630 Anemia in neoplastic disease: Secondary | ICD-10-CM | POA: Diagnosis not present

## 2016-03-23 DIAGNOSIS — D631 Anemia in chronic kidney disease: Secondary | ICD-10-CM | POA: Diagnosis not present

## 2016-03-23 DIAGNOSIS — D630 Anemia in neoplastic disease: Secondary | ICD-10-CM | POA: Diagnosis not present

## 2016-03-23 DIAGNOSIS — N186 End stage renal disease: Secondary | ICD-10-CM | POA: Diagnosis not present

## 2016-03-23 DIAGNOSIS — R17 Unspecified jaundice: Secondary | ICD-10-CM | POA: Diagnosis not present

## 2016-03-23 DIAGNOSIS — Z79899 Other long term (current) drug therapy: Secondary | ICD-10-CM | POA: Diagnosis not present

## 2016-03-23 DIAGNOSIS — N2581 Secondary hyperparathyroidism of renal origin: Secondary | ICD-10-CM | POA: Diagnosis not present

## 2016-03-24 DIAGNOSIS — Z79899 Other long term (current) drug therapy: Secondary | ICD-10-CM | POA: Diagnosis not present

## 2016-03-24 DIAGNOSIS — D630 Anemia in neoplastic disease: Secondary | ICD-10-CM | POA: Diagnosis not present

## 2016-03-24 DIAGNOSIS — N2581 Secondary hyperparathyroidism of renal origin: Secondary | ICD-10-CM | POA: Diagnosis not present

## 2016-03-24 DIAGNOSIS — D631 Anemia in chronic kidney disease: Secondary | ICD-10-CM | POA: Diagnosis not present

## 2016-03-24 DIAGNOSIS — R17 Unspecified jaundice: Secondary | ICD-10-CM | POA: Diagnosis not present

## 2016-03-24 DIAGNOSIS — N186 End stage renal disease: Secondary | ICD-10-CM | POA: Diagnosis not present

## 2016-03-25 DIAGNOSIS — R17 Unspecified jaundice: Secondary | ICD-10-CM | POA: Diagnosis not present

## 2016-03-25 DIAGNOSIS — Z79899 Other long term (current) drug therapy: Secondary | ICD-10-CM | POA: Diagnosis not present

## 2016-03-25 DIAGNOSIS — N2581 Secondary hyperparathyroidism of renal origin: Secondary | ICD-10-CM | POA: Diagnosis not present

## 2016-03-25 DIAGNOSIS — D630 Anemia in neoplastic disease: Secondary | ICD-10-CM | POA: Diagnosis not present

## 2016-03-25 DIAGNOSIS — N186 End stage renal disease: Secondary | ICD-10-CM | POA: Diagnosis not present

## 2016-03-25 DIAGNOSIS — D631 Anemia in chronic kidney disease: Secondary | ICD-10-CM | POA: Diagnosis not present

## 2016-03-26 DIAGNOSIS — N2581 Secondary hyperparathyroidism of renal origin: Secondary | ICD-10-CM | POA: Diagnosis not present

## 2016-03-26 DIAGNOSIS — D631 Anemia in chronic kidney disease: Secondary | ICD-10-CM | POA: Diagnosis not present

## 2016-03-26 DIAGNOSIS — R17 Unspecified jaundice: Secondary | ICD-10-CM | POA: Diagnosis not present

## 2016-03-26 DIAGNOSIS — J189 Pneumonia, unspecified organism: Secondary | ICD-10-CM | POA: Diagnosis not present

## 2016-03-26 DIAGNOSIS — N186 End stage renal disease: Secondary | ICD-10-CM | POA: Diagnosis not present

## 2016-03-26 DIAGNOSIS — J9601 Acute respiratory failure with hypoxia: Secondary | ICD-10-CM | POA: Diagnosis not present

## 2016-03-26 DIAGNOSIS — D630 Anemia in neoplastic disease: Secondary | ICD-10-CM | POA: Diagnosis not present

## 2016-03-26 DIAGNOSIS — Z79899 Other long term (current) drug therapy: Secondary | ICD-10-CM | POA: Diagnosis not present

## 2016-03-27 DIAGNOSIS — D630 Anemia in neoplastic disease: Secondary | ICD-10-CM | POA: Diagnosis not present

## 2016-03-27 DIAGNOSIS — Z79899 Other long term (current) drug therapy: Secondary | ICD-10-CM | POA: Diagnosis not present

## 2016-03-27 DIAGNOSIS — R17 Unspecified jaundice: Secondary | ICD-10-CM | POA: Diagnosis not present

## 2016-03-27 DIAGNOSIS — D631 Anemia in chronic kidney disease: Secondary | ICD-10-CM | POA: Diagnosis not present

## 2016-03-27 DIAGNOSIS — N2581 Secondary hyperparathyroidism of renal origin: Secondary | ICD-10-CM | POA: Diagnosis not present

## 2016-03-27 DIAGNOSIS — N186 End stage renal disease: Secondary | ICD-10-CM | POA: Diagnosis not present

## 2016-03-28 DIAGNOSIS — Z79899 Other long term (current) drug therapy: Secondary | ICD-10-CM | POA: Diagnosis not present

## 2016-03-28 DIAGNOSIS — D631 Anemia in chronic kidney disease: Secondary | ICD-10-CM | POA: Diagnosis not present

## 2016-03-28 DIAGNOSIS — Z992 Dependence on renal dialysis: Secondary | ICD-10-CM | POA: Diagnosis not present

## 2016-03-28 DIAGNOSIS — N2581 Secondary hyperparathyroidism of renal origin: Secondary | ICD-10-CM | POA: Diagnosis not present

## 2016-03-28 DIAGNOSIS — N186 End stage renal disease: Secondary | ICD-10-CM | POA: Diagnosis not present

## 2016-03-28 DIAGNOSIS — R17 Unspecified jaundice: Secondary | ICD-10-CM | POA: Diagnosis not present

## 2016-03-28 DIAGNOSIS — D630 Anemia in neoplastic disease: Secondary | ICD-10-CM | POA: Diagnosis not present

## 2016-03-28 DIAGNOSIS — I7789 Other specified disorders of arteries and arterioles: Secondary | ICD-10-CM | POA: Diagnosis not present

## 2016-03-29 DIAGNOSIS — N2581 Secondary hyperparathyroidism of renal origin: Secondary | ICD-10-CM | POA: Diagnosis not present

## 2016-03-29 DIAGNOSIS — Z4932 Encounter for adequacy testing for peritoneal dialysis: Secondary | ICD-10-CM | POA: Diagnosis not present

## 2016-03-29 DIAGNOSIS — Z79899 Other long term (current) drug therapy: Secondary | ICD-10-CM | POA: Diagnosis not present

## 2016-03-29 DIAGNOSIS — R17 Unspecified jaundice: Secondary | ICD-10-CM | POA: Diagnosis not present

## 2016-03-29 DIAGNOSIS — D630 Anemia in neoplastic disease: Secondary | ICD-10-CM | POA: Diagnosis not present

## 2016-03-29 DIAGNOSIS — E440 Moderate protein-calorie malnutrition: Secondary | ICD-10-CM | POA: Diagnosis not present

## 2016-03-29 DIAGNOSIS — J9601 Acute respiratory failure with hypoxia: Secondary | ICD-10-CM | POA: Diagnosis not present

## 2016-03-29 DIAGNOSIS — K769 Liver disease, unspecified: Secondary | ICD-10-CM | POA: Diagnosis not present

## 2016-03-29 DIAGNOSIS — D631 Anemia in chronic kidney disease: Secondary | ICD-10-CM | POA: Diagnosis not present

## 2016-03-29 DIAGNOSIS — R8299 Other abnormal findings in urine: Secondary | ICD-10-CM | POA: Diagnosis not present

## 2016-03-29 DIAGNOSIS — J189 Pneumonia, unspecified organism: Secondary | ICD-10-CM | POA: Diagnosis not present

## 2016-03-29 DIAGNOSIS — D509 Iron deficiency anemia, unspecified: Secondary | ICD-10-CM | POA: Diagnosis not present

## 2016-03-29 DIAGNOSIS — N186 End stage renal disease: Secondary | ICD-10-CM | POA: Diagnosis not present

## 2016-03-30 DIAGNOSIS — Z4932 Encounter for adequacy testing for peritoneal dialysis: Secondary | ICD-10-CM | POA: Diagnosis not present

## 2016-03-30 DIAGNOSIS — D509 Iron deficiency anemia, unspecified: Secondary | ICD-10-CM | POA: Diagnosis not present

## 2016-03-30 DIAGNOSIS — Z79899 Other long term (current) drug therapy: Secondary | ICD-10-CM | POA: Diagnosis not present

## 2016-03-30 DIAGNOSIS — N186 End stage renal disease: Secondary | ICD-10-CM | POA: Diagnosis not present

## 2016-03-30 DIAGNOSIS — D631 Anemia in chronic kidney disease: Secondary | ICD-10-CM | POA: Diagnosis not present

## 2016-03-30 DIAGNOSIS — D630 Anemia in neoplastic disease: Secondary | ICD-10-CM | POA: Diagnosis not present

## 2016-03-31 DIAGNOSIS — N186 End stage renal disease: Secondary | ICD-10-CM | POA: Diagnosis not present

## 2016-03-31 DIAGNOSIS — D630 Anemia in neoplastic disease: Secondary | ICD-10-CM | POA: Diagnosis not present

## 2016-03-31 DIAGNOSIS — D509 Iron deficiency anemia, unspecified: Secondary | ICD-10-CM | POA: Diagnosis not present

## 2016-03-31 DIAGNOSIS — D631 Anemia in chronic kidney disease: Secondary | ICD-10-CM | POA: Diagnosis not present

## 2016-03-31 DIAGNOSIS — Z4932 Encounter for adequacy testing for peritoneal dialysis: Secondary | ICD-10-CM | POA: Diagnosis not present

## 2016-03-31 DIAGNOSIS — Z79899 Other long term (current) drug therapy: Secondary | ICD-10-CM | POA: Diagnosis not present

## 2016-04-01 DIAGNOSIS — D631 Anemia in chronic kidney disease: Secondary | ICD-10-CM | POA: Diagnosis not present

## 2016-04-01 DIAGNOSIS — Z79899 Other long term (current) drug therapy: Secondary | ICD-10-CM | POA: Diagnosis not present

## 2016-04-01 DIAGNOSIS — Z4932 Encounter for adequacy testing for peritoneal dialysis: Secondary | ICD-10-CM | POA: Diagnosis not present

## 2016-04-01 DIAGNOSIS — N186 End stage renal disease: Secondary | ICD-10-CM | POA: Diagnosis not present

## 2016-04-01 DIAGNOSIS — D509 Iron deficiency anemia, unspecified: Secondary | ICD-10-CM | POA: Diagnosis not present

## 2016-04-01 DIAGNOSIS — D630 Anemia in neoplastic disease: Secondary | ICD-10-CM | POA: Diagnosis not present

## 2016-04-02 DIAGNOSIS — N186 End stage renal disease: Secondary | ICD-10-CM | POA: Diagnosis not present

## 2016-04-02 DIAGNOSIS — D630 Anemia in neoplastic disease: Secondary | ICD-10-CM | POA: Diagnosis not present

## 2016-04-02 DIAGNOSIS — D631 Anemia in chronic kidney disease: Secondary | ICD-10-CM | POA: Diagnosis not present

## 2016-04-02 DIAGNOSIS — D509 Iron deficiency anemia, unspecified: Secondary | ICD-10-CM | POA: Diagnosis not present

## 2016-04-02 DIAGNOSIS — Z4932 Encounter for adequacy testing for peritoneal dialysis: Secondary | ICD-10-CM | POA: Diagnosis not present

## 2016-04-02 DIAGNOSIS — Z79899 Other long term (current) drug therapy: Secondary | ICD-10-CM | POA: Diagnosis not present

## 2016-04-03 DIAGNOSIS — D509 Iron deficiency anemia, unspecified: Secondary | ICD-10-CM | POA: Diagnosis not present

## 2016-04-03 DIAGNOSIS — D631 Anemia in chronic kidney disease: Secondary | ICD-10-CM | POA: Diagnosis not present

## 2016-04-03 DIAGNOSIS — N186 End stage renal disease: Secondary | ICD-10-CM | POA: Diagnosis not present

## 2016-04-03 DIAGNOSIS — D630 Anemia in neoplastic disease: Secondary | ICD-10-CM | POA: Diagnosis not present

## 2016-04-03 DIAGNOSIS — Z79899 Other long term (current) drug therapy: Secondary | ICD-10-CM | POA: Diagnosis not present

## 2016-04-03 DIAGNOSIS — Z4932 Encounter for adequacy testing for peritoneal dialysis: Secondary | ICD-10-CM | POA: Diagnosis not present

## 2016-04-04 DIAGNOSIS — D630 Anemia in neoplastic disease: Secondary | ICD-10-CM | POA: Diagnosis not present

## 2016-04-04 DIAGNOSIS — D509 Iron deficiency anemia, unspecified: Secondary | ICD-10-CM | POA: Diagnosis not present

## 2016-04-04 DIAGNOSIS — D631 Anemia in chronic kidney disease: Secondary | ICD-10-CM | POA: Diagnosis not present

## 2016-04-04 DIAGNOSIS — Z79899 Other long term (current) drug therapy: Secondary | ICD-10-CM | POA: Diagnosis not present

## 2016-04-04 DIAGNOSIS — Z4932 Encounter for adequacy testing for peritoneal dialysis: Secondary | ICD-10-CM | POA: Diagnosis not present

## 2016-04-04 DIAGNOSIS — N186 End stage renal disease: Secondary | ICD-10-CM | POA: Diagnosis not present

## 2016-04-05 DIAGNOSIS — D630 Anemia in neoplastic disease: Secondary | ICD-10-CM | POA: Diagnosis not present

## 2016-04-05 DIAGNOSIS — D509 Iron deficiency anemia, unspecified: Secondary | ICD-10-CM | POA: Diagnosis not present

## 2016-04-05 DIAGNOSIS — D631 Anemia in chronic kidney disease: Secondary | ICD-10-CM | POA: Diagnosis not present

## 2016-04-05 DIAGNOSIS — Z4932 Encounter for adequacy testing for peritoneal dialysis: Secondary | ICD-10-CM | POA: Diagnosis not present

## 2016-04-05 DIAGNOSIS — Z79899 Other long term (current) drug therapy: Secondary | ICD-10-CM | POA: Diagnosis not present

## 2016-04-05 DIAGNOSIS — N186 End stage renal disease: Secondary | ICD-10-CM | POA: Diagnosis not present

## 2016-04-06 ENCOUNTER — Encounter: Payer: Self-pay | Admitting: Physician Assistant

## 2016-04-06 ENCOUNTER — Ambulatory Visit (INDEPENDENT_AMBULATORY_CARE_PROVIDER_SITE_OTHER): Payer: Medicare Other | Admitting: Physician Assistant

## 2016-04-06 VITALS — BP 132/80 | HR 99 | Temp 97.1°F | Ht 69.0 in | Wt 137.0 lb

## 2016-04-06 DIAGNOSIS — D631 Anemia in chronic kidney disease: Secondary | ICD-10-CM | POA: Diagnosis not present

## 2016-04-06 DIAGNOSIS — Z4932 Encounter for adequacy testing for peritoneal dialysis: Secondary | ICD-10-CM | POA: Diagnosis not present

## 2016-04-06 DIAGNOSIS — Z87448 Personal history of other diseases of urinary system: Secondary | ICD-10-CM | POA: Diagnosis not present

## 2016-04-06 DIAGNOSIS — R300 Dysuria: Secondary | ICD-10-CM

## 2016-04-06 DIAGNOSIS — D630 Anemia in neoplastic disease: Secondary | ICD-10-CM | POA: Diagnosis not present

## 2016-04-06 DIAGNOSIS — N186 End stage renal disease: Secondary | ICD-10-CM | POA: Diagnosis not present

## 2016-04-06 DIAGNOSIS — N3000 Acute cystitis without hematuria: Secondary | ICD-10-CM

## 2016-04-06 DIAGNOSIS — Z79899 Other long term (current) drug therapy: Secondary | ICD-10-CM | POA: Diagnosis not present

## 2016-04-06 DIAGNOSIS — D509 Iron deficiency anemia, unspecified: Secondary | ICD-10-CM | POA: Diagnosis not present

## 2016-04-06 LAB — URINALYSIS, COMPLETE
Bilirubin, UA: NEGATIVE
Glucose, UA: NEGATIVE
Ketones, UA: NEGATIVE
NITRITE UA: NEGATIVE
PH UA: 7.5 (ref 5.0–7.5)
Specific Gravity, UA: 1.025 (ref 1.005–1.030)
UUROB: 0.2 mg/dL (ref 0.2–1.0)

## 2016-04-06 LAB — MICROSCOPIC EXAMINATION: RENAL EPITHEL UA: NONE SEEN /HPF

## 2016-04-06 MED ORDER — CIPROFLOXACIN HCL 500 MG PO TABS: 500.0000 mg | ORAL_TABLET | Freq: Two times a day (BID) | ORAL | 0 refills | Status: DC

## 2016-04-06 NOTE — Patient Instructions (Signed)
Urinary Tract Infection, Adult °A urinary tract infection (UTI) is an infection of any part of the urinary tract. The urinary tract includes the: °· Kidneys. °· Ureters. °· Bladder. °· Urethra. °These organs make, store, and get rid of pee (urine) in the body. °Follow these instructions at home: °· Take over-the-counter and prescription medicines only as told by your doctor. °· If you were prescribed an antibiotic medicine, take it as told by your doctor. Do not stop taking the antibiotic even if you start to feel better. °· Avoid the following drinks: °¨ Alcohol. °¨ Caffeine. °¨ Tea. °¨ Carbonated drinks. °· Drink enough fluid to keep your pee clear or pale yellow. °· Keep all follow-up visits as told by your doctor. This is important. °· Make sure to: °¨ Empty your bladder often and completely. Do not to hold pee for long periods of time. °¨ Empty your bladder before and after sex. °¨ Wipe from front to back after a bowel movement if you are male. Use each tissue one time when you wipe. °Contact a doctor if: °· You have back pain. °· You have a fever. °· You feel sick to your stomach (nauseous). °· You throw up (vomit). °· Your symptoms do not get better after 3 days. °· Your symptoms go away and then come back. °Get help right away if: °· You have very bad back pain. °· You have very bad lower belly (abdominal) pain. °· You are throwing up and cannot keep down any medicines or water. °This information is not intended to replace advice given to you by your health care provider. Make sure you discuss any questions you have with your health care provider. °Document Released: 07/04/2007 Document Revised: 06/23/2015 Document Reviewed: 12/06/2014 °Elsevier Interactive Patient Education © 2017 [employer] ° °
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
## 2016-04-07 DIAGNOSIS — Z4932 Encounter for adequacy testing for peritoneal dialysis: Secondary | ICD-10-CM | POA: Diagnosis not present

## 2016-04-07 DIAGNOSIS — N186 End stage renal disease: Secondary | ICD-10-CM | POA: Diagnosis not present

## 2016-04-07 DIAGNOSIS — D509 Iron deficiency anemia, unspecified: Secondary | ICD-10-CM | POA: Diagnosis not present

## 2016-04-07 DIAGNOSIS — D630 Anemia in neoplastic disease: Secondary | ICD-10-CM | POA: Diagnosis not present

## 2016-04-07 DIAGNOSIS — Z79899 Other long term (current) drug therapy: Secondary | ICD-10-CM | POA: Diagnosis not present

## 2016-04-07 DIAGNOSIS — D631 Anemia in chronic kidney disease: Secondary | ICD-10-CM | POA: Diagnosis not present

## 2016-04-08 DIAGNOSIS — D631 Anemia in chronic kidney disease: Secondary | ICD-10-CM | POA: Diagnosis not present

## 2016-04-08 DIAGNOSIS — D509 Iron deficiency anemia, unspecified: Secondary | ICD-10-CM | POA: Diagnosis not present

## 2016-04-08 DIAGNOSIS — Z79899 Other long term (current) drug therapy: Secondary | ICD-10-CM | POA: Diagnosis not present

## 2016-04-08 DIAGNOSIS — D630 Anemia in neoplastic disease: Secondary | ICD-10-CM | POA: Diagnosis not present

## 2016-04-08 DIAGNOSIS — N186 End stage renal disease: Secondary | ICD-10-CM | POA: Diagnosis not present

## 2016-04-08 DIAGNOSIS — Z4932 Encounter for adequacy testing for peritoneal dialysis: Secondary | ICD-10-CM | POA: Diagnosis not present

## 2016-04-09 ENCOUNTER — Other Ambulatory Visit: Payer: Self-pay | Admitting: Physician Assistant

## 2016-04-09 DIAGNOSIS — D631 Anemia in chronic kidney disease: Secondary | ICD-10-CM | POA: Diagnosis not present

## 2016-04-09 DIAGNOSIS — D509 Iron deficiency anemia, unspecified: Secondary | ICD-10-CM | POA: Diagnosis not present

## 2016-04-09 DIAGNOSIS — N186 End stage renal disease: Secondary | ICD-10-CM | POA: Diagnosis not present

## 2016-04-09 DIAGNOSIS — Z4932 Encounter for adequacy testing for peritoneal dialysis: Secondary | ICD-10-CM | POA: Diagnosis not present

## 2016-04-09 DIAGNOSIS — Z79899 Other long term (current) drug therapy: Secondary | ICD-10-CM | POA: Diagnosis not present

## 2016-04-09 DIAGNOSIS — D630 Anemia in neoplastic disease: Secondary | ICD-10-CM | POA: Diagnosis not present

## 2016-04-09 LAB — URINE CULTURE

## 2016-04-09 MED ORDER — NITROFURANTOIN MONOHYD MACRO 100 MG PO CAPS: 100.0000 mg | ORAL_CAPSULE | Freq: Two times a day (BID) | ORAL | 0 refills | Status: DC

## 2016-04-09 NOTE — Progress Notes (Unsigned)
LMTCB/ww 03/12

## 2016-04-10 DIAGNOSIS — Z79899 Other long term (current) drug therapy: Secondary | ICD-10-CM | POA: Diagnosis not present

## 2016-04-10 DIAGNOSIS — Z4932 Encounter for adequacy testing for peritoneal dialysis: Secondary | ICD-10-CM | POA: Diagnosis not present

## 2016-04-10 DIAGNOSIS — D630 Anemia in neoplastic disease: Secondary | ICD-10-CM | POA: Diagnosis not present

## 2016-04-10 DIAGNOSIS — N186 End stage renal disease: Secondary | ICD-10-CM | POA: Diagnosis not present

## 2016-04-10 DIAGNOSIS — D509 Iron deficiency anemia, unspecified: Secondary | ICD-10-CM | POA: Diagnosis not present

## 2016-04-10 DIAGNOSIS — D631 Anemia in chronic kidney disease: Secondary | ICD-10-CM | POA: Diagnosis not present

## 2016-04-11 DIAGNOSIS — Z79899 Other long term (current) drug therapy: Secondary | ICD-10-CM | POA: Diagnosis not present

## 2016-04-11 DIAGNOSIS — Z4932 Encounter for adequacy testing for peritoneal dialysis: Secondary | ICD-10-CM | POA: Diagnosis not present

## 2016-04-11 DIAGNOSIS — N186 End stage renal disease: Secondary | ICD-10-CM | POA: Diagnosis not present

## 2016-04-11 DIAGNOSIS — D509 Iron deficiency anemia, unspecified: Secondary | ICD-10-CM | POA: Diagnosis not present

## 2016-04-11 DIAGNOSIS — D631 Anemia in chronic kidney disease: Secondary | ICD-10-CM | POA: Diagnosis not present

## 2016-04-11 DIAGNOSIS — D630 Anemia in neoplastic disease: Secondary | ICD-10-CM | POA: Diagnosis not present

## 2016-04-12 DIAGNOSIS — N186 End stage renal disease: Secondary | ICD-10-CM | POA: Diagnosis not present

## 2016-04-12 DIAGNOSIS — D630 Anemia in neoplastic disease: Secondary | ICD-10-CM | POA: Diagnosis not present

## 2016-04-12 DIAGNOSIS — Z4932 Encounter for adequacy testing for peritoneal dialysis: Secondary | ICD-10-CM | POA: Diagnosis not present

## 2016-04-12 DIAGNOSIS — D509 Iron deficiency anemia, unspecified: Secondary | ICD-10-CM | POA: Diagnosis not present

## 2016-04-12 DIAGNOSIS — Z79899 Other long term (current) drug therapy: Secondary | ICD-10-CM | POA: Diagnosis not present

## 2016-04-12 DIAGNOSIS — D631 Anemia in chronic kidney disease: Secondary | ICD-10-CM | POA: Diagnosis not present

## 2016-04-13 DIAGNOSIS — Z4932 Encounter for adequacy testing for peritoneal dialysis: Secondary | ICD-10-CM | POA: Diagnosis not present

## 2016-04-13 DIAGNOSIS — Z79899 Other long term (current) drug therapy: Secondary | ICD-10-CM | POA: Diagnosis not present

## 2016-04-13 DIAGNOSIS — D509 Iron deficiency anemia, unspecified: Secondary | ICD-10-CM | POA: Diagnosis not present

## 2016-04-13 DIAGNOSIS — D631 Anemia in chronic kidney disease: Secondary | ICD-10-CM | POA: Diagnosis not present

## 2016-04-13 DIAGNOSIS — N186 End stage renal disease: Secondary | ICD-10-CM | POA: Diagnosis not present

## 2016-04-13 DIAGNOSIS — D630 Anemia in neoplastic disease: Secondary | ICD-10-CM | POA: Diagnosis not present

## 2016-04-14 DIAGNOSIS — D630 Anemia in neoplastic disease: Secondary | ICD-10-CM | POA: Diagnosis not present

## 2016-04-14 DIAGNOSIS — D631 Anemia in chronic kidney disease: Secondary | ICD-10-CM | POA: Diagnosis not present

## 2016-04-14 DIAGNOSIS — N186 End stage renal disease: Secondary | ICD-10-CM | POA: Diagnosis not present

## 2016-04-14 DIAGNOSIS — D509 Iron deficiency anemia, unspecified: Secondary | ICD-10-CM | POA: Diagnosis not present

## 2016-04-14 DIAGNOSIS — Z79899 Other long term (current) drug therapy: Secondary | ICD-10-CM | POA: Diagnosis not present

## 2016-04-14 DIAGNOSIS — Z4932 Encounter for adequacy testing for peritoneal dialysis: Secondary | ICD-10-CM | POA: Diagnosis not present

## 2016-04-15 DIAGNOSIS — Z4932 Encounter for adequacy testing for peritoneal dialysis: Secondary | ICD-10-CM | POA: Diagnosis not present

## 2016-04-15 DIAGNOSIS — D509 Iron deficiency anemia, unspecified: Secondary | ICD-10-CM | POA: Diagnosis not present

## 2016-04-15 DIAGNOSIS — D630 Anemia in neoplastic disease: Secondary | ICD-10-CM | POA: Diagnosis not present

## 2016-04-15 DIAGNOSIS — N186 End stage renal disease: Secondary | ICD-10-CM | POA: Diagnosis not present

## 2016-04-15 DIAGNOSIS — Z79899 Other long term (current) drug therapy: Secondary | ICD-10-CM | POA: Diagnosis not present

## 2016-04-15 DIAGNOSIS — D631 Anemia in chronic kidney disease: Secondary | ICD-10-CM | POA: Diagnosis not present

## 2016-04-16 ENCOUNTER — Other Ambulatory Visit: Payer: Self-pay | Admitting: Family Medicine

## 2016-04-16 ENCOUNTER — Other Ambulatory Visit (INDEPENDENT_AMBULATORY_CARE_PROVIDER_SITE_OTHER): Payer: Medicare Other

## 2016-04-16 DIAGNOSIS — Z79899 Other long term (current) drug therapy: Secondary | ICD-10-CM | POA: Diagnosis not present

## 2016-04-16 DIAGNOSIS — D509 Iron deficiency anemia, unspecified: Secondary | ICD-10-CM | POA: Diagnosis not present

## 2016-04-16 DIAGNOSIS — D630 Anemia in neoplastic disease: Secondary | ICD-10-CM | POA: Diagnosis not present

## 2016-04-16 DIAGNOSIS — D631 Anemia in chronic kidney disease: Secondary | ICD-10-CM | POA: Diagnosis not present

## 2016-04-16 DIAGNOSIS — R300 Dysuria: Secondary | ICD-10-CM | POA: Diagnosis not present

## 2016-04-16 DIAGNOSIS — N186 End stage renal disease: Secondary | ICD-10-CM | POA: Diagnosis not present

## 2016-04-16 DIAGNOSIS — Z4932 Encounter for adequacy testing for peritoneal dialysis: Secondary | ICD-10-CM | POA: Diagnosis not present

## 2016-04-16 LAB — URINALYSIS, ROUTINE W REFLEX MICROSCOPIC
Bilirubin, UA: NEGATIVE
Glucose, UA: NEGATIVE
Nitrite, UA: NEGATIVE
PH UA: 5 (ref 5.0–7.5)
Specific Gravity, UA: 1.015 (ref 1.005–1.030)
UUROB: 0.2 mg/dL (ref 0.2–1.0)

## 2016-04-16 LAB — MICROSCOPIC EXAMINATION

## 2016-04-16 MED ORDER — NITROFURANTOIN MONOHYD MACRO 100 MG PO CAPS: 100.0000 mg | ORAL_CAPSULE | Freq: Two times a day (BID) | ORAL | 0 refills | Status: DC

## 2016-04-17 DIAGNOSIS — D630 Anemia in neoplastic disease: Secondary | ICD-10-CM | POA: Diagnosis not present

## 2016-04-17 DIAGNOSIS — Z79899 Other long term (current) drug therapy: Secondary | ICD-10-CM | POA: Diagnosis not present

## 2016-04-17 DIAGNOSIS — D631 Anemia in chronic kidney disease: Secondary | ICD-10-CM | POA: Diagnosis not present

## 2016-04-17 DIAGNOSIS — D509 Iron deficiency anemia, unspecified: Secondary | ICD-10-CM | POA: Diagnosis not present

## 2016-04-17 DIAGNOSIS — N186 End stage renal disease: Secondary | ICD-10-CM | POA: Diagnosis not present

## 2016-04-17 DIAGNOSIS — Z4932 Encounter for adequacy testing for peritoneal dialysis: Secondary | ICD-10-CM | POA: Diagnosis not present

## 2016-04-18 DIAGNOSIS — D630 Anemia in neoplastic disease: Secondary | ICD-10-CM | POA: Diagnosis not present

## 2016-04-18 DIAGNOSIS — N186 End stage renal disease: Secondary | ICD-10-CM | POA: Diagnosis not present

## 2016-04-18 DIAGNOSIS — D631 Anemia in chronic kidney disease: Secondary | ICD-10-CM | POA: Diagnosis not present

## 2016-04-18 DIAGNOSIS — D509 Iron deficiency anemia, unspecified: Secondary | ICD-10-CM | POA: Diagnosis not present

## 2016-04-18 DIAGNOSIS — Z79899 Other long term (current) drug therapy: Secondary | ICD-10-CM | POA: Diagnosis not present

## 2016-04-18 DIAGNOSIS — Z4932 Encounter for adequacy testing for peritoneal dialysis: Secondary | ICD-10-CM | POA: Diagnosis not present

## 2016-04-19 DIAGNOSIS — D631 Anemia in chronic kidney disease: Secondary | ICD-10-CM | POA: Diagnosis not present

## 2016-04-19 DIAGNOSIS — N186 End stage renal disease: Secondary | ICD-10-CM | POA: Diagnosis not present

## 2016-04-19 DIAGNOSIS — Z79899 Other long term (current) drug therapy: Secondary | ICD-10-CM | POA: Diagnosis not present

## 2016-04-19 DIAGNOSIS — Z4932 Encounter for adequacy testing for peritoneal dialysis: Secondary | ICD-10-CM | POA: Diagnosis not present

## 2016-04-19 DIAGNOSIS — D509 Iron deficiency anemia, unspecified: Secondary | ICD-10-CM | POA: Diagnosis not present

## 2016-04-19 DIAGNOSIS — D630 Anemia in neoplastic disease: Secondary | ICD-10-CM | POA: Diagnosis not present

## 2016-04-20 DIAGNOSIS — Z4932 Encounter for adequacy testing for peritoneal dialysis: Secondary | ICD-10-CM | POA: Diagnosis not present

## 2016-04-20 DIAGNOSIS — D631 Anemia in chronic kidney disease: Secondary | ICD-10-CM | POA: Diagnosis not present

## 2016-04-20 DIAGNOSIS — D509 Iron deficiency anemia, unspecified: Secondary | ICD-10-CM | POA: Diagnosis not present

## 2016-04-20 DIAGNOSIS — Z79899 Other long term (current) drug therapy: Secondary | ICD-10-CM | POA: Diagnosis not present

## 2016-04-20 DIAGNOSIS — N186 End stage renal disease: Secondary | ICD-10-CM | POA: Diagnosis not present

## 2016-04-20 DIAGNOSIS — D630 Anemia in neoplastic disease: Secondary | ICD-10-CM | POA: Diagnosis not present

## 2016-04-20 LAB — URINE CULTURE

## 2016-04-21 DIAGNOSIS — D509 Iron deficiency anemia, unspecified: Secondary | ICD-10-CM | POA: Diagnosis not present

## 2016-04-21 DIAGNOSIS — Z79899 Other long term (current) drug therapy: Secondary | ICD-10-CM | POA: Diagnosis not present

## 2016-04-21 DIAGNOSIS — N186 End stage renal disease: Secondary | ICD-10-CM | POA: Diagnosis not present

## 2016-04-21 DIAGNOSIS — D631 Anemia in chronic kidney disease: Secondary | ICD-10-CM | POA: Diagnosis not present

## 2016-04-21 DIAGNOSIS — D630 Anemia in neoplastic disease: Secondary | ICD-10-CM | POA: Diagnosis not present

## 2016-04-21 DIAGNOSIS — Z4932 Encounter for adequacy testing for peritoneal dialysis: Secondary | ICD-10-CM | POA: Diagnosis not present

## 2016-04-22 DIAGNOSIS — Z79899 Other long term (current) drug therapy: Secondary | ICD-10-CM | POA: Diagnosis not present

## 2016-04-22 DIAGNOSIS — Z4932 Encounter for adequacy testing for peritoneal dialysis: Secondary | ICD-10-CM | POA: Diagnosis not present

## 2016-04-22 DIAGNOSIS — D509 Iron deficiency anemia, unspecified: Secondary | ICD-10-CM | POA: Diagnosis not present

## 2016-04-22 DIAGNOSIS — N186 End stage renal disease: Secondary | ICD-10-CM | POA: Diagnosis not present

## 2016-04-22 DIAGNOSIS — D630 Anemia in neoplastic disease: Secondary | ICD-10-CM | POA: Diagnosis not present

## 2016-04-22 DIAGNOSIS — D631 Anemia in chronic kidney disease: Secondary | ICD-10-CM | POA: Diagnosis not present

## 2016-04-23 DIAGNOSIS — N186 End stage renal disease: Secondary | ICD-10-CM | POA: Diagnosis not present

## 2016-04-23 DIAGNOSIS — D631 Anemia in chronic kidney disease: Secondary | ICD-10-CM | POA: Diagnosis not present

## 2016-04-23 DIAGNOSIS — Z4932 Encounter for adequacy testing for peritoneal dialysis: Secondary | ICD-10-CM | POA: Diagnosis not present

## 2016-04-23 DIAGNOSIS — D509 Iron deficiency anemia, unspecified: Secondary | ICD-10-CM | POA: Diagnosis not present

## 2016-04-23 DIAGNOSIS — D630 Anemia in neoplastic disease: Secondary | ICD-10-CM | POA: Diagnosis not present

## 2016-04-23 DIAGNOSIS — Z79899 Other long term (current) drug therapy: Secondary | ICD-10-CM | POA: Diagnosis not present

## 2016-04-24 DIAGNOSIS — N186 End stage renal disease: Secondary | ICD-10-CM | POA: Diagnosis not present

## 2016-04-24 DIAGNOSIS — D631 Anemia in chronic kidney disease: Secondary | ICD-10-CM | POA: Diagnosis not present

## 2016-04-24 DIAGNOSIS — Z4932 Encounter for adequacy testing for peritoneal dialysis: Secondary | ICD-10-CM | POA: Diagnosis not present

## 2016-04-24 DIAGNOSIS — D509 Iron deficiency anemia, unspecified: Secondary | ICD-10-CM | POA: Diagnosis not present

## 2016-04-24 DIAGNOSIS — Z79899 Other long term (current) drug therapy: Secondary | ICD-10-CM | POA: Diagnosis not present

## 2016-04-24 DIAGNOSIS — D630 Anemia in neoplastic disease: Secondary | ICD-10-CM | POA: Diagnosis not present

## 2016-04-25 DIAGNOSIS — Z4932 Encounter for adequacy testing for peritoneal dialysis: Secondary | ICD-10-CM | POA: Diagnosis not present

## 2016-04-25 DIAGNOSIS — N186 End stage renal disease: Secondary | ICD-10-CM | POA: Diagnosis not present

## 2016-04-25 DIAGNOSIS — D509 Iron deficiency anemia, unspecified: Secondary | ICD-10-CM | POA: Diagnosis not present

## 2016-04-25 DIAGNOSIS — Z79899 Other long term (current) drug therapy: Secondary | ICD-10-CM | POA: Diagnosis not present

## 2016-04-25 DIAGNOSIS — D630 Anemia in neoplastic disease: Secondary | ICD-10-CM | POA: Diagnosis not present

## 2016-04-25 DIAGNOSIS — D631 Anemia in chronic kidney disease: Secondary | ICD-10-CM | POA: Diagnosis not present

## 2016-04-26 DIAGNOSIS — Z4932 Encounter for adequacy testing for peritoneal dialysis: Secondary | ICD-10-CM | POA: Diagnosis not present

## 2016-04-26 DIAGNOSIS — D631 Anemia in chronic kidney disease: Secondary | ICD-10-CM | POA: Diagnosis not present

## 2016-04-26 DIAGNOSIS — D630 Anemia in neoplastic disease: Secondary | ICD-10-CM | POA: Diagnosis not present

## 2016-04-26 DIAGNOSIS — Z79899 Other long term (current) drug therapy: Secondary | ICD-10-CM | POA: Diagnosis not present

## 2016-04-26 DIAGNOSIS — D509 Iron deficiency anemia, unspecified: Secondary | ICD-10-CM | POA: Diagnosis not present

## 2016-04-26 DIAGNOSIS — N186 End stage renal disease: Secondary | ICD-10-CM | POA: Diagnosis not present

## 2016-04-27 ENCOUNTER — Emergency Department (HOSPITAL_COMMUNITY)
Admission: EM | Admit: 2016-04-27 | Discharge: 2016-04-28 | Disposition: A | Discharge status: Home / Self Care | Payer: Medicare Other | Source: Home / Self Care | Attending: Emergency Medicine | Admitting: Emergency Medicine

## 2016-04-27 ENCOUNTER — Emergency Department (HOSPITAL_COMMUNITY): Payer: Medicare Other

## 2016-04-27 ENCOUNTER — Encounter (HOSPITAL_COMMUNITY): Payer: Self-pay

## 2016-04-27 ENCOUNTER — Ambulatory Visit (INDEPENDENT_AMBULATORY_CARE_PROVIDER_SITE_OTHER): Payer: Medicare Other | Admitting: Family Medicine

## 2016-04-27 DIAGNOSIS — N186 End stage renal disease: Secondary | ICD-10-CM

## 2016-04-27 DIAGNOSIS — I120 Hypertensive chronic kidney disease with stage 5 chronic kidney disease or end stage renal disease: Secondary | ICD-10-CM | POA: Diagnosis not present

## 2016-04-27 DIAGNOSIS — R9431 Abnormal electrocardiogram [ECG] [EKG]: Secondary | ICD-10-CM | POA: Diagnosis not present

## 2016-04-27 DIAGNOSIS — S41112A Laceration without foreign body of left upper arm, initial encounter: Secondary | ICD-10-CM | POA: Diagnosis not present

## 2016-04-27 DIAGNOSIS — Z915 Personal history of self-harm: Secondary | ICD-10-CM

## 2016-04-27 DIAGNOSIS — J189 Pneumonia, unspecified organism: Secondary | ICD-10-CM

## 2016-04-27 DIAGNOSIS — S299XXA Unspecified injury of thorax, initial encounter: Secondary | ICD-10-CM | POA: Diagnosis not present

## 2016-04-27 DIAGNOSIS — M6282 Rhabdomyolysis: Secondary | ICD-10-CM | POA: Diagnosis not present

## 2016-04-27 DIAGNOSIS — S81811A Laceration without foreign body, right lower leg, initial encounter: Secondary | ICD-10-CM

## 2016-04-27 DIAGNOSIS — Y939 Activity, unspecified: Secondary | ICD-10-CM

## 2016-04-27 DIAGNOSIS — W010XXA Fall on same level from slipping, tripping and stumbling without subsequent striking against object, initial encounter: Secondary | ICD-10-CM | POA: Insufficient documentation

## 2016-04-27 DIAGNOSIS — E559 Vitamin D deficiency, unspecified: Secondary | ICD-10-CM | POA: Diagnosis not present

## 2016-04-27 DIAGNOSIS — Z992 Dependence on renal dialysis: Secondary | ICD-10-CM | POA: Diagnosis not present

## 2016-04-27 DIAGNOSIS — S0990XA Unspecified injury of head, initial encounter: Secondary | ICD-10-CM | POA: Diagnosis not present

## 2016-04-27 DIAGNOSIS — Z4932 Encounter for adequacy testing for peritoneal dialysis: Secondary | ICD-10-CM | POA: Diagnosis not present

## 2016-04-27 DIAGNOSIS — E039 Hypothyroidism, unspecified: Secondary | ICD-10-CM | POA: Diagnosis not present

## 2016-04-27 DIAGNOSIS — Y999 Unspecified external cause status: Secondary | ICD-10-CM

## 2016-04-27 DIAGNOSIS — J9601 Acute respiratory failure with hypoxia: Secondary | ICD-10-CM

## 2016-04-27 DIAGNOSIS — Y929 Unspecified place or not applicable: Secondary | ICD-10-CM | POA: Insufficient documentation

## 2016-04-27 DIAGNOSIS — D631 Anemia in chronic kidney disease: Secondary | ICD-10-CM | POA: Diagnosis not present

## 2016-04-27 DIAGNOSIS — R930 Abnormal findings on diagnostic imaging of skull and head, not elsewhere classified: Secondary | ICD-10-CM | POA: Insufficient documentation

## 2016-04-27 DIAGNOSIS — S29021A Laceration of muscle and tendon of front wall of thorax, initial encounter: Secondary | ICD-10-CM | POA: Insufficient documentation

## 2016-04-27 DIAGNOSIS — D630 Anemia in neoplastic disease: Secondary | ICD-10-CM | POA: Diagnosis not present

## 2016-04-27 DIAGNOSIS — S41111A Laceration without foreign body of right upper arm, initial encounter: Secondary | ICD-10-CM | POA: Diagnosis not present

## 2016-04-27 DIAGNOSIS — G2401 Drug induced subacute dyskinesia: Secondary | ICD-10-CM | POA: Diagnosis not present

## 2016-04-27 DIAGNOSIS — R531 Weakness: Secondary | ICD-10-CM | POA: Diagnosis not present

## 2016-04-27 DIAGNOSIS — R404 Transient alteration of awareness: Secondary | ICD-10-CM | POA: Diagnosis not present

## 2016-04-27 DIAGNOSIS — S0101XA Laceration without foreign body of scalp, initial encounter: Secondary | ICD-10-CM | POA: Diagnosis not present

## 2016-04-27 DIAGNOSIS — N185 Chronic kidney disease, stage 5: Secondary | ICD-10-CM

## 2016-04-27 DIAGNOSIS — N390 Urinary tract infection, site not specified: Secondary | ICD-10-CM | POA: Diagnosis not present

## 2016-04-27 DIAGNOSIS — R1314 Dysphagia, pharyngoesophageal phase: Secondary | ICD-10-CM | POA: Diagnosis not present

## 2016-04-27 DIAGNOSIS — K900 Celiac disease: Secondary | ICD-10-CM | POA: Diagnosis not present

## 2016-04-27 DIAGNOSIS — W19XXXA Unspecified fall, initial encounter: Secondary | ICD-10-CM

## 2016-04-27 DIAGNOSIS — S81812A Laceration without foreign body, left lower leg, initial encounter: Secondary | ICD-10-CM | POA: Insufficient documentation

## 2016-04-27 DIAGNOSIS — Z79899 Other long term (current) drug therapy: Secondary | ICD-10-CM

## 2016-04-27 DIAGNOSIS — N2581 Secondary hyperparathyroidism of renal origin: Secondary | ICD-10-CM | POA: Diagnosis not present

## 2016-04-27 DIAGNOSIS — F319 Bipolar disorder, unspecified: Secondary | ICD-10-CM

## 2016-04-27 DIAGNOSIS — D509 Iron deficiency anemia, unspecified: Secondary | ICD-10-CM | POA: Diagnosis not present

## 2016-04-27 DIAGNOSIS — S21119A Laceration without foreign body of unspecified front wall of thorax without penetration into thoracic cavity, initial encounter: Secondary | ICD-10-CM | POA: Diagnosis not present

## 2016-04-27 DIAGNOSIS — S59919A Unspecified injury of unspecified forearm, initial encounter: Secondary | ICD-10-CM | POA: Diagnosis not present

## 2016-04-27 DIAGNOSIS — E871 Hypo-osmolality and hyponatremia: Secondary | ICD-10-CM | POA: Diagnosis not present

## 2016-04-27 DIAGNOSIS — N3000 Acute cystitis without hematuria: Secondary | ICD-10-CM | POA: Diagnosis not present

## 2016-04-27 LAB — COMPREHENSIVE METABOLIC PANEL
ALBUMIN: 2.6 g/dL — AB (ref 3.5–5.0)
ALT: 16 U/L — ABNORMAL LOW (ref 17–63)
ANION GAP: 19 — AB (ref 5–15)
AST: 74 U/L — ABNORMAL HIGH (ref 15–41)
Alkaline Phosphatase: 89 U/L (ref 38–126)
BILIRUBIN TOTAL: 1.1 mg/dL (ref 0.3–1.2)
BUN: 45 mg/dL — ABNORMAL HIGH (ref 6–20)
CALCIUM: 8.4 mg/dL — AB (ref 8.9–10.3)
CO2: 20 mmol/L — ABNORMAL LOW (ref 22–32)
Chloride: 86 mmol/L — ABNORMAL LOW (ref 101–111)
Creatinine, Ser: 7.82 mg/dL — ABNORMAL HIGH (ref 0.61–1.24)
GFR, EST AFRICAN AMERICAN: 7 mL/min — AB (ref >60)
GFR, EST NON AFRICAN AMERICAN: 6 mL/min — AB (ref >60)
Glucose, Bld: 77 mg/dL (ref 65–99)
POTASSIUM: 3.8 mmol/L (ref 3.5–5.1)
Sodium: 125 mmol/L — ABNORMAL LOW (ref 135–145)
Total Protein: 6 g/dL — ABNORMAL LOW (ref 6.5–8.1)

## 2016-04-27 LAB — CK: CK TOTAL: 3860 U/L — AB (ref 49–397)

## 2016-04-27 LAB — CBC WITH DIFFERENTIAL/PLATELET
Basophils Absolute: 0 10*3/uL (ref 0.0–0.1)
Basophils Relative: 0 %
Eosinophils Absolute: 0 10*3/uL (ref 0.0–0.7)
Eosinophils Relative: 0 %
HEMATOCRIT: 43.2 % (ref 39.0–52.0)
Hemoglobin: 14.2 g/dL (ref 13.0–17.0)
LYMPHS ABS: 0.5 10*3/uL — AB (ref 0.7–4.0)
Lymphocytes Relative: 4 %
MCH: 29.2 pg (ref 26.0–34.0)
MCHC: 32.9 g/dL (ref 30.0–36.0)
MCV: 88.7 fL (ref 78.0–100.0)
MONO ABS: 1.2 10*3/uL — AB (ref 0.1–1.0)
MONOS PCT: 9 %
NEUTROS ABS: 10.7 10*3/uL — AB (ref 1.7–7.7)
Neutrophils Relative %: 87 %
Platelets: 248 10*3/uL (ref 150–400)
RBC: 4.87 MIL/uL (ref 4.22–5.81)
RDW: 16.5 % — AB (ref 11.5–15.5)
WBC: 12.5 10*3/uL — ABNORMAL HIGH (ref 4.0–10.5)

## 2016-04-27 LAB — CBG MONITORING, ED: Glucose-Capillary: 79 mg/dL (ref 65–99)

## 2016-04-27 MED ORDER — SODIUM CHLORIDE 0.9 % IV BOLUS (SEPSIS)
1000.0000 mL | Freq: Once | INTRAVENOUS | Status: AC
  Administered 2016-04-27: 1000 mL via INTRAVENOUS

## 2016-04-27 NOTE — ED Triage Notes (Signed)
Pt comes via [facility], found on floor, possible been there for 8-10 hours, pt is a peritoneal dialysis. [facility] [place] out of bed, and was unable to get up, did not hit head, no LOC, abrasion to ribs bilaterally and arms.

## 2016-04-27 NOTE — ED Notes (Signed)
Pt does not make urine.
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
## 2016-04-27 NOTE — ED Triage Notes (Addendum)
Pt LSN was 9:30pm by family last night

## 2016-04-28 ENCOUNTER — Inpatient Hospital Stay (HOSPITAL_COMMUNITY): Payer: Medicare Other

## 2016-04-28 ENCOUNTER — Emergency Department (HOSPITAL_COMMUNITY): Payer: Medicare Other

## 2016-04-28 ENCOUNTER — Encounter (HOSPITAL_COMMUNITY): Payer: Self-pay | Admitting: *Deleted

## 2016-04-28 ENCOUNTER — Inpatient Hospital Stay (HOSPITAL_COMMUNITY)
Admission: EM | Admit: 2016-04-28 | Discharge: 2016-05-04 | DRG: 689 | Disposition: A | Discharge status: Home Health | Payer: Medicare Other | Attending: Internal Medicine | Admitting: Internal Medicine

## 2016-04-28 DIAGNOSIS — E871 Hypo-osmolality and hyponatremia: Secondary | ICD-10-CM | POA: Diagnosis present

## 2016-04-28 DIAGNOSIS — Z992 Dependence on renal dialysis: Secondary | ICD-10-CM | POA: Diagnosis not present

## 2016-04-28 DIAGNOSIS — E039 Hypothyroidism, unspecified: Secondary | ICD-10-CM | POA: Diagnosis present

## 2016-04-28 DIAGNOSIS — D631 Anemia in chronic kidney disease: Secondary | ICD-10-CM | POA: Diagnosis not present

## 2016-04-28 DIAGNOSIS — E8889 Other specified metabolic disorders: Secondary | ICD-10-CM | POA: Diagnosis present

## 2016-04-28 DIAGNOSIS — Y92009 Unspecified place in unspecified non-institutional (private) residence as the place of occurrence of the external cause: Secondary | ICD-10-CM | POA: Diagnosis not present

## 2016-04-28 DIAGNOSIS — E876 Hypokalemia: Secondary | ICD-10-CM | POA: Diagnosis present

## 2016-04-28 DIAGNOSIS — F319 Bipolar disorder, unspecified: Secondary | ICD-10-CM | POA: Diagnosis present

## 2016-04-28 DIAGNOSIS — S0990XA Unspecified injury of head, initial encounter: Secondary | ICD-10-CM

## 2016-04-28 DIAGNOSIS — E860 Dehydration: Secondary | ICD-10-CM | POA: Diagnosis present

## 2016-04-28 DIAGNOSIS — R740 Nonspecific elevation of levels of transaminase and lactic acid dehydrogenase [LDH]: Secondary | ICD-10-CM

## 2016-04-28 DIAGNOSIS — N2581 Secondary hyperparathyroidism of renal origin: Secondary | ICD-10-CM | POA: Diagnosis present

## 2016-04-28 DIAGNOSIS — B965 Pseudomonas (aeruginosa) (mallei) (pseudomallei) as the cause of diseases classified elsewhere: Secondary | ICD-10-CM | POA: Diagnosis present

## 2016-04-28 DIAGNOSIS — M6282 Rhabdomyolysis: Secondary | ICD-10-CM | POA: Diagnosis not present

## 2016-04-28 DIAGNOSIS — N2589 Other disorders resulting from impaired renal tubular function: Secondary | ICD-10-CM | POA: Diagnosis not present

## 2016-04-28 DIAGNOSIS — K900 Celiac disease: Secondary | ICD-10-CM | POA: Diagnosis present

## 2016-04-28 DIAGNOSIS — S0101XA Laceration without foreign body of scalp, initial encounter: Secondary | ICD-10-CM | POA: Diagnosis present

## 2016-04-28 DIAGNOSIS — N3000 Acute cystitis without hematuria: Secondary | ICD-10-CM

## 2016-04-28 DIAGNOSIS — R296 Repeated falls: Secondary | ICD-10-CM | POA: Diagnosis present

## 2016-04-28 DIAGNOSIS — E034 Atrophy of thyroid (acquired): Secondary | ICD-10-CM | POA: Diagnosis not present

## 2016-04-28 DIAGNOSIS — W19XXXA Unspecified fall, initial encounter: Secondary | ICD-10-CM | POA: Diagnosis present

## 2016-04-28 DIAGNOSIS — Z8249 Family history of ischemic heart disease and other diseases of the circulatory system: Secondary | ICD-10-CM | POA: Diagnosis not present

## 2016-04-28 DIAGNOSIS — I10 Essential (primary) hypertension: Secondary | ICD-10-CM | POA: Diagnosis not present

## 2016-04-28 DIAGNOSIS — F17220 Nicotine dependence, chewing tobacco, uncomplicated: Secondary | ICD-10-CM | POA: Diagnosis present

## 2016-04-28 DIAGNOSIS — Z79899 Other long term (current) drug therapy: Secondary | ICD-10-CM | POA: Diagnosis not present

## 2016-04-28 DIAGNOSIS — I7789 Other specified disorders of arteries and arterioles: Secondary | ICD-10-CM | POA: Diagnosis not present

## 2016-04-28 DIAGNOSIS — Z915 Personal history of self-harm: Secondary | ICD-10-CM | POA: Diagnosis not present

## 2016-04-28 DIAGNOSIS — I120 Hypertensive chronic kidney disease with stage 5 chronic kidney disease or end stage renal disease: Secondary | ICD-10-CM | POA: Diagnosis present

## 2016-04-28 DIAGNOSIS — R7401 Elevation of levels of liver transaminase levels: Secondary | ICD-10-CM | POA: Diagnosis present

## 2016-04-28 DIAGNOSIS — N390 Urinary tract infection, site not specified: Principal | ICD-10-CM | POA: Diagnosis present

## 2016-04-28 DIAGNOSIS — S0101XD Laceration without foreign body of scalp, subsequent encounter: Secondary | ICD-10-CM | POA: Diagnosis not present

## 2016-04-28 DIAGNOSIS — N186 End stage renal disease: Secondary | ICD-10-CM | POA: Diagnosis not present

## 2016-04-28 DIAGNOSIS — N281 Cyst of kidney, acquired: Secondary | ICD-10-CM | POA: Diagnosis not present

## 2016-04-28 DIAGNOSIS — B9620 Unspecified Escherichia coli [E. coli] as the cause of diseases classified elsewhere: Secondary | ICD-10-CM | POA: Diagnosis present

## 2016-04-28 DIAGNOSIS — F3170 Bipolar disorder, currently in remission, most recent episode unspecified: Secondary | ICD-10-CM | POA: Diagnosis not present

## 2016-04-28 DIAGNOSIS — D630 Anemia in neoplastic disease: Secondary | ICD-10-CM | POA: Diagnosis not present

## 2016-04-28 DIAGNOSIS — K769 Liver disease, unspecified: Secondary | ICD-10-CM | POA: Diagnosis not present

## 2016-04-28 DIAGNOSIS — D649 Anemia, unspecified: Secondary | ICD-10-CM | POA: Diagnosis present

## 2016-04-28 DIAGNOSIS — R9431 Abnormal electrocardiogram [ECG] [EKG]: Secondary | ICD-10-CM | POA: Diagnosis not present

## 2016-04-28 DIAGNOSIS — Z1612 Extended spectrum beta lactamase (ESBL) resistance: Secondary | ICD-10-CM | POA: Diagnosis present

## 2016-04-28 DIAGNOSIS — D509 Iron deficiency anemia, unspecified: Secondary | ICD-10-CM | POA: Diagnosis not present

## 2016-04-28 DIAGNOSIS — Z4932 Encounter for adequacy testing for peritoneal dialysis: Secondary | ICD-10-CM | POA: Diagnosis not present

## 2016-04-28 LAB — CBC WITH DIFFERENTIAL/PLATELET
BASOS ABS: 0 10*3/uL (ref 0.0–0.1)
Basophils Relative: 0 %
EOS PCT: 0 %
Eosinophils Absolute: 0 10*3/uL (ref 0.0–0.7)
HEMATOCRIT: 41.6 % (ref 39.0–52.0)
HEMOGLOBIN: 13.5 g/dL (ref 13.0–17.0)
LYMPHS ABS: 1.1 10*3/uL (ref 0.7–4.0)
LYMPHS PCT: 9 %
MCH: 29.2 pg (ref 26.0–34.0)
MCHC: 32.5 g/dL (ref 30.0–36.0)
MCV: 89.8 fL (ref 78.0–100.0)
Monocytes Absolute: 1.5 10*3/uL — ABNORMAL HIGH (ref 0.1–1.0)
Monocytes Relative: 11 %
NEUTROS ABS: 10.6 10*3/uL — AB (ref 1.7–7.7)
NEUTROS PCT: 80 %
Platelets: 249 10*3/uL (ref 150–400)
RBC: 4.63 MIL/uL (ref 4.22–5.81)
RDW: 16.6 % — ABNORMAL HIGH (ref 11.5–15.5)
WBC: 13.3 10*3/uL — AB (ref 4.0–10.5)

## 2016-04-28 LAB — COMPREHENSIVE METABOLIC PANEL
ALBUMIN: 2.4 g/dL — AB (ref 3.5–5.0)
ALT: 19 U/L (ref 17–63)
ANION GAP: 17 — AB (ref 5–15)
AST: 86 U/L — AB (ref 15–41)
Alkaline Phosphatase: 83 U/L (ref 38–126)
BUN: 41 mg/dL — AB (ref 6–20)
CHLORIDE: 87 mmol/L — AB (ref 101–111)
CO2: 19 mmol/L — ABNORMAL LOW (ref 22–32)
Calcium: 8.1 mg/dL — ABNORMAL LOW (ref 8.9–10.3)
Creatinine, Ser: 6.98 mg/dL — ABNORMAL HIGH (ref 0.61–1.24)
GFR calc Af Amer: 8 mL/min — ABNORMAL LOW (ref >60)
GFR, EST NON AFRICAN AMERICAN: 7 mL/min — AB (ref >60)
Glucose, Bld: 131 mg/dL — ABNORMAL HIGH (ref 65–99)
POTASSIUM: 3.5 mmol/L (ref 3.5–5.1)
Sodium: 123 mmol/L — ABNORMAL LOW (ref 135–145)
Total Bilirubin: 0.6 mg/dL (ref 0.3–1.2)
Total Protein: 5.9 g/dL — ABNORMAL LOW (ref 6.5–8.1)

## 2016-04-28 LAB — GRAM STAIN: Gram Stain: NONE SEEN

## 2016-04-28 LAB — URINALYSIS, ROUTINE W REFLEX MICROSCOPIC
Bilirubin Urine: NEGATIVE
Glucose, UA: NEGATIVE mg/dL
KETONES UR: NEGATIVE mg/dL
Nitrite: NEGATIVE
PROTEIN: 100 mg/dL — AB
RBC / HPF: NONE SEEN RBC/hpf (ref 0–5)
SQUAMOUS EPITHELIAL / LPF: NONE SEEN
Specific Gravity, Urine: 1.01 (ref 1.005–1.030)
pH: 7 (ref 5.0–8.0)

## 2016-04-28 LAB — LACTIC ACID, PLASMA
LACTIC ACID, VENOUS: 2.8 mmol/L — AB (ref 0.5–1.9)
Lactic Acid, Venous: 3.2 mmol/L (ref 0.5–1.9)
Lactic Acid, Venous: 1.7 mmol/L (ref 0.5–1.9)

## 2016-04-28 LAB — BODY FLUID CELL COUNT WITH DIFFERENTIAL: Total Nucleated Cell Count, Fluid: 4 cu mm (ref 0–1000)

## 2016-04-28 LAB — VALPROIC ACID LEVEL: Valproic Acid Lvl: 36 ug/mL — ABNORMAL LOW (ref 50.0–100.0)

## 2016-04-28 LAB — CK: CK TOTAL: 1675 U/L — AB (ref 49–397)

## 2016-04-28 LAB — PROCALCITONIN: Procalcitonin: 3.53 ng/mL

## 2016-04-28 LAB — SEDIMENTATION RATE: SED RATE: 17 mm/h — AB (ref 0–16)

## 2016-04-28 MED ORDER — LEVOTHYROXINE SODIUM 50 MCG PO TABS
50.0000 ug | ORAL_TABLET | Freq: Every day | ORAL | Status: DC
  Administered 2016-04-29 – 2016-05-04 (×6): 50 ug via ORAL
  Filled 2016-04-28 (×7): qty 1

## 2016-04-28 MED ORDER — LOSARTAN POTASSIUM 25 MG PO TABS
12.5000 mg | ORAL_TABLET | Freq: Every day | ORAL | Status: DC
  Administered 2016-04-28 – 2016-05-02 (×5): 12.5 mg via ORAL
  Filled 2016-04-28 (×6): qty 1

## 2016-04-28 MED ORDER — NEPRO/CARBSTEADY PO LIQD
237.0000 mL | Freq: Two times a day (BID) | ORAL | Status: DC
  Administered 2016-04-29 – 2016-04-30 (×3): 237 mL via ORAL

## 2016-04-28 MED ORDER — SODIUM CHLORIDE 0.9 % IV SOLN
Freq: Once | INTRAVENOUS | Status: DC
  Administered 2016-04-28: 100 mL/h via INTRAVENOUS

## 2016-04-28 MED ORDER — SODIUM CHLORIDE 0.9 % IV SOLN
INTRAVENOUS | Status: DC
  Administered 2016-04-28 (×2): via INTRAVENOUS

## 2016-04-28 MED ORDER — DIVALPROEX SODIUM 500 MG PO DR TAB
500.0000 mg | DELAYED_RELEASE_TABLET | Freq: Every day | ORAL | Status: DC
  Administered 2016-04-28 – 2016-05-02 (×5): 500 mg via ORAL
  Filled 2016-04-28 (×6): qty 1

## 2016-04-28 MED ORDER — SODIUM CHLORIDE 0.9 % IV SOLN
500.0000 mg | INTRAVENOUS | Status: DC
  Administered 2016-04-28 – 2016-05-04 (×7): 500 mg via INTRAVENOUS
  Filled 2016-04-28 (×7): qty 0.5

## 2016-04-28 MED ORDER — VITAMIN D 1000 UNITS PO TABS
2000.0000 [IU] | ORAL_TABLET | Freq: Every day | ORAL | Status: DC
  Administered 2016-04-29 – 2016-05-04 (×6): 2000 [IU] via ORAL
  Filled 2016-04-28 (×6): qty 2

## 2016-04-28 MED ORDER — HEPARIN SODIUM (PORCINE) 5000 UNIT/ML IJ SOLN
5000.0000 [IU] | Freq: Three times a day (TID) | INTRAMUSCULAR | Status: DC
  Administered 2016-04-28 – 2016-05-04 (×15): 5000 [IU] via SUBCUTANEOUS
  Filled 2016-04-28 (×13): qty 1

## 2016-04-28 MED ORDER — CALCITRIOL 0.5 MCG PO CAPS
0.7500 ug | ORAL_CAPSULE | Freq: Every day | ORAL | Status: DC
  Administered 2016-04-29 – 2016-05-04 (×6): 0.75 ug via ORAL
  Filled 2016-04-28 (×6): qty 1

## 2016-04-28 MED ORDER — GENTAMICIN SULFATE 0.1 % EX CREA
1.0000 "application " | TOPICAL_CREAM | Freq: Every day | CUTANEOUS | Status: DC
  Administered 2016-04-29 – 2016-05-04 (×6): 1 via TOPICAL
  Filled 2016-04-28 (×2): qty 15

## 2016-04-28 MED ORDER — CALCIUM CARBONATE ANTACID 500 MG PO CHEW
2.0000 | CHEWABLE_TABLET | Freq: Three times a day (TID) | ORAL | Status: DC
  Administered 2016-04-29 – 2016-05-04 (×16): 400 mg via ORAL
  Filled 2016-04-28 (×16): qty 2

## 2016-04-28 MED ORDER — ACETAMINOPHEN 650 MG RE SUPP: 650.0000 mg | Freq: Four times a day (QID) | RECTAL | Status: DC | PRN

## 2016-04-28 MED ORDER — DELFLEX-LC/1.5% DEXTROSE 344 MOSM/L IP SOLN
INTRAPERITONEAL | Status: DC
  Administered 2016-04-29: 5000 mL via INTRAPERITONEAL

## 2016-04-28 MED ORDER — SODIUM CHLORIDE 0.9% FLUSH
3.0000 mL | Freq: Two times a day (BID) | INTRAVENOUS | Status: DC
  Administered 2016-04-28 – 2016-05-04 (×11): 3 mL via INTRAVENOUS

## 2016-04-28 MED ORDER — VITAMIN D 50 MCG (2000 UT) PO CAPS: 2000.0000 [IU] | ORAL_CAPSULE | Freq: Every day | ORAL | Status: DC

## 2016-04-28 MED ORDER — DIVALPROEX SODIUM 250 MG PO DR TAB
250.0000 mg | DELAYED_RELEASE_TABLET | Freq: Every day | ORAL | Status: DC
  Administered 2016-04-29 – 2016-05-04 (×6): 250 mg via ORAL
  Filled 2016-04-28 (×6): qty 1

## 2016-04-28 MED ORDER — SODIUM CHLORIDE 0.9 % IV BOLUS (SEPSIS)
500.0000 mL | Freq: Once | INTRAVENOUS | Status: AC
  Administered 2016-04-28: 500 mL via INTRAVENOUS

## 2016-04-28 MED ORDER — LIDOCAINE-EPINEPHRINE (PF) 2 %-1:200000 IJ SOLN
10.0000 mL | Freq: Once | INTRAMUSCULAR | Status: DC
  Filled 2016-04-28: qty 20

## 2016-04-28 MED ORDER — ACETAMINOPHEN 325 MG PO TABS: 650.0000 mg | ORAL_TABLET | Freq: Four times a day (QID) | ORAL | Status: DC | PRN

## 2016-04-28 MED ORDER — HALOPERIDOL 2 MG PO TABS
2.0000 mg | ORAL_TABLET | Freq: Every day | ORAL | Status: DC
  Administered 2016-04-28 – 2016-05-02 (×5): 2 mg via ORAL
  Filled 2016-04-28 (×6): qty 1

## 2016-04-28 MED ORDER — SODIUM CHLORIDE 0.9 % IV BOLUS (SEPSIS): 250.0000 mL | Freq: Once | INTRAVENOUS | Status: DC

## 2016-04-28 MED ORDER — PRO-STAT SUGAR FREE PO LIQD
30.0000 mL | Freq: Two times a day (BID) | ORAL | Status: DC
  Administered 2016-04-28 – 2016-05-03 (×10): 30 mL via ORAL
  Filled 2016-04-28 (×12): qty 30
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
## 2016-04-28 NOTE — ED Triage Notes (Signed)
Per family, pt was seen here last night for a fall. Pt went home and ambulated this am with walker and fell again. Pt has large laceration to top of his head, bleeding controlled.

## 2016-04-28 NOTE — ED Notes (Signed)
Pt to Ultrasound

## 2016-04-28 NOTE — Progress Notes (Signed)
Pharmacy Antibiotic Note  JENNY OMDAHL is a 71 y.o. male admitted on 04/28/2016 with UTI.  Pharmacy has been consulted for meropenem dosing. Afebrile, WBC 13.3. ESRD.  Plan: Continue meropenem 500mg  IV Q24h Monitor clinical picture, renal function F/U HD schedule, C&S, abx deescalation / LOT   Temp (24hrs), Avg:97.7 F (36.5 C), Min:97.6 F (36.4 C), Max:97.8 F (36.6 C)   Recent Labs Lab 04/27/16 2038 04/28/16 1008  WBC 12.5* 13.3*  CREATININE 7.82* 6.98*    CrCl cannot be calculated (Unknown ideal weight.).    Allergies  Allergen Reactions  . Sulfa Antibiotics Other (See Comments)    Bruises.    Antimicrobials this admission: Meropenem 3/31 >>   Dose adjustments this admission: n/a  Microbiology results: 3/31 BCx: sent  Thank you for allowing pharmacy to be a part of this patient's care.  Elenor Quinones, PharmD, BCPS Clinical Pharmacist Pager [phone] 04/28/2016 12:45 PM

## 2016-04-28 NOTE — Progress Notes (Signed)
Lactic Acid 3.2 MD Hobbs notified 500 mL Bolus Ordered and started

## 2016-04-28 NOTE — ED Notes (Signed)
Wheeled patient to bathroom patient pt got urine sample now patient is in xray

## 2016-04-29 DIAGNOSIS — M6282 Rhabdomyolysis: Secondary | ICD-10-CM

## 2016-04-29 DIAGNOSIS — N186 End stage renal disease: Secondary | ICD-10-CM | POA: Diagnosis not present

## 2016-04-29 DIAGNOSIS — Z992 Dependence on renal dialysis: Secondary | ICD-10-CM

## 2016-04-29 DIAGNOSIS — K769 Liver disease, unspecified: Secondary | ICD-10-CM | POA: Diagnosis not present

## 2016-04-29 DIAGNOSIS — N2581 Secondary hyperparathyroidism of renal origin: Secondary | ICD-10-CM | POA: Diagnosis not present

## 2016-04-29 DIAGNOSIS — D509 Iron deficiency anemia, unspecified: Secondary | ICD-10-CM | POA: Diagnosis not present

## 2016-04-29 DIAGNOSIS — Z4932 Encounter for adequacy testing for peritoneal dialysis: Secondary | ICD-10-CM | POA: Diagnosis not present

## 2016-04-29 DIAGNOSIS — D630 Anemia in neoplastic disease: Secondary | ICD-10-CM | POA: Diagnosis not present

## 2016-04-29 DIAGNOSIS — F3170 Bipolar disorder, currently in remission, most recent episode unspecified: Secondary | ICD-10-CM

## 2016-04-29 DIAGNOSIS — I10 Essential (primary) hypertension: Secondary | ICD-10-CM

## 2016-04-29 DIAGNOSIS — R740 Nonspecific elevation of levels of transaminase and lactic acid dehydrogenase [LDH]: Secondary | ICD-10-CM

## 2016-04-29 LAB — PHOSPHORUS: PHOSPHORUS: 6.1 mg/dL — AB (ref 2.5–4.6)

## 2016-04-29 LAB — CBC
HCT: 36.1 % — ABNORMAL LOW (ref 39.0–52.0)
HEMOGLOBIN: 11.8 g/dL — AB (ref 13.0–17.0)
MCH: 29.2 pg (ref 26.0–34.0)
MCHC: 32.7 g/dL (ref 30.0–36.0)
MCV: 89.4 fL (ref 78.0–100.0)
PLATELETS: 245 10*3/uL (ref 150–400)
RBC: 4.04 MIL/uL — AB (ref 4.22–5.81)
RDW: 16.9 % — ABNORMAL HIGH (ref 11.5–15.5)
WBC: 7.6 10*3/uL (ref 4.0–10.5)

## 2016-04-29 LAB — CK: CK TOTAL: 536 U/L — AB (ref 49–397)

## 2016-04-29 LAB — COMPREHENSIVE METABOLIC PANEL
ALBUMIN: 2 g/dL — AB (ref 3.5–5.0)
ALT: 16 U/L — AB (ref 17–63)
ANION GAP: 13 (ref 5–15)
AST: 59 U/L — AB (ref 15–41)
Alkaline Phosphatase: 70 U/L (ref 38–126)
BUN: 38 mg/dL — ABNORMAL HIGH (ref 6–20)
CHLORIDE: 93 mmol/L — AB (ref 101–111)
CO2: 22 mmol/L (ref 22–32)
CREATININE: 6.63 mg/dL — AB (ref 0.61–1.24)
Calcium: 7.6 mg/dL — ABNORMAL LOW (ref 8.9–10.3)
GFR, EST AFRICAN AMERICAN: 9 mL/min — AB (ref >60)
GFR, EST NON AFRICAN AMERICAN: 8 mL/min — AB (ref >60)
Glucose, Bld: 100 mg/dL — ABNORMAL HIGH (ref 65–99)
Potassium: 2.6 mmol/L — CL (ref 3.5–5.1)
SODIUM: 128 mmol/L — AB (ref 135–145)
Total Bilirubin: 0.4 mg/dL (ref 0.3–1.2)
Total Protein: 4.4 g/dL — ABNORMAL LOW (ref 6.5–8.1)

## 2016-04-29 MED ORDER — POTASSIUM CHLORIDE CRYS ER 20 MEQ PO TBCR
30.0000 meq | EXTENDED_RELEASE_TABLET | Freq: Once | ORAL | Status: AC
  Administered 2016-04-29: 30 meq via ORAL
  Filled 2016-04-29: qty 1

## 2016-04-29 MED ORDER — DELFLEX-LC/2.5% DEXTROSE 394 MOSM/L IP SOLN
INTRAPERITONEAL | Status: DC
  Administered 2016-04-30: 5000 mL via INTRAPERITONEAL
  Administered 2016-04-30: 15000 mL via INTRAPERITONEAL
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
## 2016-04-29 NOTE — Progress Notes (Signed)
Rec'd critical K+ level of 2.6 from the lab.  MD made aware.  30 meq PO K+ given to patient without difficulty.  Will continue to monitor.  Earleen Reaper RN-BC, [employer]

## 2016-04-30 ENCOUNTER — Encounter (HOSPITAL_COMMUNITY): Payer: Self-pay | Admitting: Family Medicine

## 2016-04-30 DIAGNOSIS — Z4932 Encounter for adequacy testing for peritoneal dialysis: Secondary | ICD-10-CM | POA: Diagnosis not present

## 2016-04-30 DIAGNOSIS — S0101XD Laceration without foreign body of scalp, subsequent encounter: Secondary | ICD-10-CM

## 2016-04-30 DIAGNOSIS — N2581 Secondary hyperparathyroidism of renal origin: Secondary | ICD-10-CM | POA: Diagnosis not present

## 2016-04-30 DIAGNOSIS — K769 Liver disease, unspecified: Secondary | ICD-10-CM | POA: Diagnosis not present

## 2016-04-30 DIAGNOSIS — N186 End stage renal disease: Secondary | ICD-10-CM | POA: Diagnosis not present

## 2016-04-30 DIAGNOSIS — D509 Iron deficiency anemia, unspecified: Secondary | ICD-10-CM | POA: Diagnosis not present

## 2016-04-30 DIAGNOSIS — D630 Anemia in neoplastic disease: Secondary | ICD-10-CM | POA: Diagnosis not present

## 2016-04-30 LAB — COMPREHENSIVE METABOLIC PANEL
ALBUMIN: 2.2 g/dL — AB (ref 3.5–5.0)
ALT: 18 U/L (ref 17–63)
AST: 51 U/L — AB (ref 15–41)
Alkaline Phosphatase: 80 U/L (ref 38–126)
Anion gap: 14 (ref 5–15)
BILIRUBIN TOTAL: 0.6 mg/dL (ref 0.3–1.2)
BUN: 36 mg/dL — AB (ref 6–20)
CALCIUM: 8.2 mg/dL — AB (ref 8.9–10.3)
CO2: 20 mmol/L — ABNORMAL LOW (ref 22–32)
Chloride: 93 mmol/L — ABNORMAL LOW (ref 101–111)
Creatinine, Ser: 6.17 mg/dL — ABNORMAL HIGH (ref 0.61–1.24)
GFR calc Af Amer: 10 mL/min — ABNORMAL LOW (ref >60)
GFR calc non Af Amer: 8 mL/min — ABNORMAL LOW (ref >60)
GLUCOSE: 110 mg/dL — AB (ref 65–99)
Potassium: 2.9 mmol/L — ABNORMAL LOW (ref 3.5–5.1)
Sodium: 127 mmol/L — ABNORMAL LOW (ref 135–145)
TOTAL PROTEIN: 5.2 g/dL — AB (ref 6.5–8.1)

## 2016-04-30 LAB — CBC
HCT: 38.4 % — ABNORMAL LOW (ref 39.0–52.0)
Hemoglobin: 12.3 g/dL — ABNORMAL LOW (ref 13.0–17.0)
MCH: 28.5 pg (ref 26.0–34.0)
MCHC: 32 g/dL (ref 30.0–36.0)
MCV: 89.1 fL (ref 78.0–100.0)
Platelets: 263 10*3/uL (ref 150–400)
RBC: 4.31 MIL/uL (ref 4.22–5.81)
RDW: 16.8 % — ABNORMAL HIGH (ref 11.5–15.5)
WBC: 8.2 10*3/uL (ref 4.0–10.5)

## 2016-04-30 LAB — C DIFFICILE QUICK SCREEN W PCR REFLEX
C Diff antigen: NEGATIVE
C Diff interpretation: NOT DETECTED
C Diff toxin: NEGATIVE

## 2016-04-30 LAB — PROCALCITONIN: Procalcitonin: 2.72 ng/mL

## 2016-04-30 LAB — MAGNESIUM: MAGNESIUM: 1.8 mg/dL (ref 1.7–2.4)

## 2016-04-30 LAB — PHOSPHORUS: PHOSPHORUS: 5.6 mg/dL — AB (ref 2.5–4.6)

## 2016-04-30 LAB — PATHOLOGIST SMEAR REVIEW

## 2016-04-30 MED ORDER — SODIUM CHLORIDE 0.9 % IV BOLUS (SEPSIS)
1000.0000 mL | Freq: Once | INTRAVENOUS | Status: AC
  Administered 2016-04-30: 1000 mL via INTRAVENOUS

## 2016-04-30 MED ORDER — POTASSIUM CHLORIDE 20 MEQ/15ML (10%) PO SOLN
30.0000 meq | Freq: Two times a day (BID) | ORAL | Status: AC
  Administered 2016-04-30 (×2): 30 meq via ORAL
  Filled 2016-04-30 (×2): qty 30
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
## 2016-04-30 NOTE — Progress Notes (Addendum)
[facility] Progress Note   Subjective: up in chair, seems to be better per son  Vitals:   04/29/16 0940 04/29/16 1831 04/29/16 2101 04/30/16 0550  BP: (!) 159/69 (!) 145/70 (!) 167/83 135/72  Pulse: 88 85 88 93  Resp: 14 14 15 16   Temp: 97.6 F (36.4 C) 99.4 F (37.4 C) 97.9 F (36.6 C) 97.7 F (36.5 C)  TempSrc: Oral Oral Oral Oral  SpO2: 98% 98% 99% 95%  Weight:   62.8 kg (138 lb 8 oz)   Height:        Inpatient medications: . calcitRIOL  0.75 mcg Oral Daily  . calcium carbonate  2 tablet Oral TID WC  . cholecalciferol  2,000 Units Oral Daily  . dialysis solution 2.5% low-MG/low-CA   Intraperitoneal Q24H  . divalproex  250 mg Oral Daily  . divalproex  500 mg Oral QHS  . feeding supplement (NEPRO CARB STEADY)  237 mL Oral BID BM  . feeding supplement (PRO-STAT SUGAR FREE 64)  30 mL Oral BID  . gentamicin cream  1 application Topical Daily  . haloperidol  2 mg Oral QHS  . heparin  5,000 Units Subcutaneous Q8H  . levothyroxine  50 mcg Oral QAC breakfast  . lidocaine-EPINEPHrine  10 mL Infiltration Once  . losartan  12.5 mg Oral QHS  . meropenem (MERREM) IV  500 mg Intravenous Q24H  . sodium chloride flush  3 mL Intravenous Q12H    acetaminophen **OR** acetaminophen  Exam: Elderly WD WN male, no distress No jvd Chest clear bilat RRR  Abd soft ntbd PD cath exit site LLQ clean and dry Ext no edema NF, stands up w/o help  Dialysis: CCPD 4 exchanges/ night, no day bag/ no pause, 90 min dwell , 3 L fill volume, usually 2.5% but if not eating 1.5%      Assessment: 1. MDR EColi UTI - consider urology input, may have "pyocystis" since usually he is anuric per the son.  Renal US w/ atrophic kidneys only.   2. ESRD cont CCPD all 2.5% 3. HTN stable BP 4. MBD cont meds 5. Anemia of CKD - Hb up , no esa for now. Last mircera given 3/23 6. Hypokalemia - replacing 7. Hyponatremia - improving, needs some more volume will give NS 1 liter today   Plan -  as above   Kelly Splinter MD [facility] pager [phone]   04/30/2016, 11:00 AM    Recent Labs Lab 04/28/16 1008 04/29/16 0435 04/30/16 0637  NA 123* 128* 127*  K 3.5 2.6* 2.9*  CL 87* 93* 93*  CO2 19* 22 20*  GLUCOSE 131* 100* 110*  BUN 41* 38* 36*  CREATININE 6.98* 6.63* 6.17*  CALCIUM 8.1* 7.6* 8.2*  PHOS  --  6.1* 5.6*    Recent Labs Lab 04/28/16 1008 04/29/16 0435 04/30/16 0637  AST 86* 59* 51*  ALT 19 16* 18  ALKPHOS 83 70 80  BILITOT 0.6 0.4 0.6  PROT 5.9* 4.4* 5.2*  ALBUMIN 2.4* 2.0* 2.2*    Recent Labs Lab 04/27/16 2038 04/28/16 1008 04/29/16 0435 04/30/16 0637  WBC 12.5* 13.3* 7.6 8.2  NEUTROABS 10.7* 10.6*  --   --   HGB 14.2 13.5 11.8* 12.3*  HCT 43.2 41.6 36.1* 38.4*  MCV 88.7 89.8 89.4 89.1  PLT 248 249 245 263   Iron/TIBC/Ferritin/ %Sat    Component Value Date/Time   IRON 95 03/06/2016 0846   TIBC 153 (L) 03/06/2016 0846   FERRITIN  974 (H) 07/26/2006 1340   IRONPCTSAT 62 (H) 03/06/2016 4445
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
## 2016-05-01 DIAGNOSIS — N186 End stage renal disease: Secondary | ICD-10-CM | POA: Diagnosis not present

## 2016-05-01 DIAGNOSIS — N2581 Secondary hyperparathyroidism of renal origin: Secondary | ICD-10-CM | POA: Diagnosis not present

## 2016-05-01 DIAGNOSIS — E034 Atrophy of thyroid (acquired): Secondary | ICD-10-CM

## 2016-05-01 DIAGNOSIS — K769 Liver disease, unspecified: Secondary | ICD-10-CM | POA: Diagnosis not present

## 2016-05-01 DIAGNOSIS — D630 Anemia in neoplastic disease: Secondary | ICD-10-CM | POA: Diagnosis not present

## 2016-05-01 DIAGNOSIS — D631 Anemia in chronic kidney disease: Secondary | ICD-10-CM

## 2016-05-01 DIAGNOSIS — D509 Iron deficiency anemia, unspecified: Secondary | ICD-10-CM | POA: Diagnosis not present

## 2016-05-01 DIAGNOSIS — Z4932 Encounter for adequacy testing for peritoneal dialysis: Secondary | ICD-10-CM | POA: Diagnosis not present

## 2016-05-01 LAB — COMPREHENSIVE METABOLIC PANEL
ALBUMIN: 2.1 g/dL — AB (ref 3.5–5.0)
ALT: 17 U/L (ref 17–63)
AST: 39 U/L (ref 15–41)
Alkaline Phosphatase: 75 U/L (ref 38–126)
Anion gap: 15 (ref 5–15)
BUN: 35 mg/dL — AB (ref 6–20)
CHLORIDE: 95 mmol/L — AB (ref 101–111)
CO2: 20 mmol/L — AB (ref 22–32)
CREATININE: 6.11 mg/dL — AB (ref 0.61–1.24)
Calcium: 8.5 mg/dL — ABNORMAL LOW (ref 8.9–10.3)
GFR calc Af Amer: 10 mL/min — ABNORMAL LOW (ref >60)
GFR calc non Af Amer: 8 mL/min — ABNORMAL LOW (ref >60)
Glucose, Bld: 86 mg/dL (ref 65–99)
Potassium: 3.2 mmol/L — ABNORMAL LOW (ref 3.5–5.1)
Sodium: 130 mmol/L — ABNORMAL LOW (ref 135–145)
Total Bilirubin: 0.5 mg/dL (ref 0.3–1.2)
Total Protein: 4.9 g/dL — ABNORMAL LOW (ref 6.5–8.1)

## 2016-05-01 LAB — CBC
HCT: 34.5 % — ABNORMAL LOW (ref 39.0–52.0)
Hemoglobin: 11.3 g/dL — ABNORMAL LOW (ref 13.0–17.0)
MCH: 29.6 pg (ref 26.0–34.0)
MCHC: 32.8 g/dL (ref 30.0–36.0)
MCV: 90.3 fL (ref 78.0–100.0)
PLATELETS: 217 10*3/uL (ref 150–400)
RBC: 3.82 MIL/uL — ABNORMAL LOW (ref 4.22–5.81)
RDW: 17 % — AB (ref 11.5–15.5)
WBC: 6.5 10*3/uL (ref 4.0–10.5)

## 2016-05-01 MED ORDER — POTASSIUM CHLORIDE 20 MEQ PO PACK
40.0000 meq | PACK | Freq: Three times a day (TID) | ORAL | Status: AC
Start: 2016-05-01 — End: 2016-05-01
  Administered 2016-05-01 (×2): 40 meq via ORAL
  Filled 2016-05-01 (×2): qty 2
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
## 2016-05-01 NOTE — Care Management Note (Signed)
Case Management Note  Patient Details  Name: Darren Dunn MRN: [id number] Date of Birth: 1945/08/25  Subjective/Objective:         CM following for progression and d/c planning.            Action/Plan: 05/01/2016 Met with pt who states that he has previously had [facility] services, also noted that [facility] wih Alvis Lemmings has been arranged with [facility] by CM. Pt states that he has a walker and that his sons live very near and are able to provide support at home. No other needs identified.   Expected Discharge Date:                  Expected Discharge Plan:  [place]  In-House Referral:  NA  Discharge planning Services  CM Consult  Post Acute Care Choice:  Home Health Choice offered to:  Adult Children (Sons, [name] and [name] at bedside. )  DME Arranged:  N/A (Has RW and 3n1 BSC) DME Agency:  NA  HH Arranged:  PT, OT, RN [place] Agency:  [place]  Status of Service:  Completed, signed off  If discussed at [place] of Stay Meetings, dates discussed:    Additional Comments:  Adron Bene, RN 05/01/2016, 12:17 PM

## 2016-05-01 NOTE — Progress Notes (Signed)
[facility] Progress Note   Subjective: up in chair, in good spirits, no c/o  Vitals:   04/30/16 2218 05/01/16 0407 05/01/16 0859 05/01/16 1243  BP: (!) 162/82 (!) 142/80 (!) 141/80 (!) 155/89  Pulse: 87 95 (!) 102 92  Resp: 17 16 18 18   Temp: 97.9 F (36.6 C) 98.5 F (36.9 C) 97.5 F (36.4 C) 98.6 F (37 C)  TempSrc: Oral Oral Oral Oral  SpO2: 98% 95% 98% 96%  Weight: 67.6 kg (149 lb)     Height:        Inpatient medications: . calcitRIOL  0.75 mcg Oral Daily  . calcium carbonate  2 tablet Oral TID WC  . cholecalciferol  2,000 Units Oral Daily  . dialysis solution 2.5% low-MG/low-CA   Intraperitoneal Q24H  . divalproex  250 mg Oral Daily  . divalproex  500 mg Oral QHS  . feeding supplement (NEPRO CARB STEADY)  237 mL Oral BID BM  . feeding supplement (PRO-STAT SUGAR FREE 64)  30 mL Oral BID  . gentamicin cream  1 application Topical Daily  . haloperidol  2 mg Oral QHS  . heparin  5,000 Units Subcutaneous Q8H  . levothyroxine  50 mcg Oral QAC breakfast  . lidocaine-EPINEPHrine  10 mL Infiltration Once  . losartan  12.5 mg Oral QHS  . meropenem (MERREM) IV  500 mg Intravenous Q24H  . sodium chloride flush  3 mL Intravenous Q12H    acetaminophen **OR** acetaminophen  Exam: Elderly WD WN male, no distress No jvd Chest clear bilat RRR  Abd soft ntbd PD cath exit site LLQ clean and dry Ext no edema NF, stands up w/o help  Dialysis: CCPD 4 exchanges/ night, no day bag/ no pause, 90 min dwell , 3 L fill volume, usually 2.5% but if not eating 1.5%      Assessment: 1. MDR EColi UTI - urology curbsided by primary, they will set up OP urology appt for recurrent UTI.  Renal US showed atrophic kidneys only.   2. ESRD cont CCPD all 2.5% 3. HTN stable BP 4. MBD cont meds 5. Anemia of CKD - Hb up , no esa for now. Last mircera given 3/23 6. Hypokalemia - replacing 7. Hyponatremia - better 8. Volume - stable 9. Dispo - needs more time as inpatient to  complete course of IV abx   Plan - cont IV abx, and CCPD   Kelly Splinter MD [place] pager [phone]   05/01/2016, 12:51 PM    Recent Labs Lab 04/29/16 0435 04/30/16 0637 05/01/16 0444  NA 128* 127* 130*  K 2.6* 2.9* 3.2*  CL 93* 93* 95*  CO2 22 20* 20*  GLUCOSE 100* 110* 86  BUN 38* 36* 35*  CREATININE 6.63* 6.17* 6.11*  CALCIUM 7.6* 8.2* 8.5*  PHOS 6.1* 5.6*  --     Recent Labs Lab 04/29/16 0435 04/30/16 0637 05/01/16 0444  AST 59* 51* 39  ALT 16* 18 17  ALKPHOS 70 80 75  BILITOT 0.4 0.6 0.5  PROT 4.4* 5.2* 4.9*  ALBUMIN 2.0* 2.2* 2.1*    Recent Labs Lab 04/27/16 2038 04/28/16 1008 04/29/16 0435 04/30/16 0637 05/01/16 0444  WBC 12.5* 13.3* 7.6 8.2 6.5  NEUTROABS 10.7* 10.6*  --   --   --   HGB 14.2 13.5 11.8* 12.3* 11.3*  HCT 43.2 41.6 36.1* 38.4* 34.5*  MCV 88.7 89.8 89.4 89.1 90.3  PLT 248 249 245 263 217   Iron/TIBC/Ferritin/ %Sat  Component Value Date/Time   IRON 95 03/06/2016 0846   TIBC 153 (L) 03/06/2016 0846   FERRITIN 974 (H) 07/26/2006 1340   IRONPCTSAT 62 (H) 03/06/2016 2909
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
## 2016-05-01 NOTE — Progress Notes (Signed)
Pharmacy Antibiotic Note  Darren Dunn is a 71 y.o. male admitted on 04/28/2016 with UTI.  Pharmacy has been consulted for meropenem dosing.   The patient is ESRD receiving CCPD. MD discussing LOT plans with ID. Dose remains appropriate at this time.   Plan: 1. Continue meropenem 500mg  IV Q24h 2. Pharmacy will sign off of consult and monitor peripherally   Temp (24hrs), Avg:98.1 F (36.7 C), Min:97.5 F (36.4 C), Max:98.6 F (37 C)   Recent Labs Lab 04/27/16 2038 04/28/16 1008 04/28/16 1315 04/28/16 1613 04/28/16 1932 04/29/16 0435 04/30/16 0637 05/01/16 0444  WBC 12.5* 13.3*  --   --   --  7.6 8.2 6.5  CREATININE 7.82* 6.98*  --   --   --  6.63* 6.17* 6.11*  LATICACIDVEN  --   --  2.8* 3.2* 1.7  --   --   --     Estimated Creatinine Clearance: 10.8 mL/min (A) (by C-G formula based on SCr of 6.11 mg/dL (H)).    Allergies  Allergen Reactions  . Sulfa Antibiotics Other (See Comments)    Bruises.    Antimicrobials this admission: Meropenem 3/31 >>    Dose adjustments this admission: n/a  Microbiology results: 3/31 UCx: ESBL E. coli 3/31 BCx: sent 3/31 PD stain: no organisms seen  Thank you for allowing pharmacy to be a part of this patient's care.  Alycia Rossetti, PharmD, BCPS Clinical Pharmacist Pager: [phone] Clinical phone for 05/01/2016 from 7a-3:30p: [phone] If after 3:30p, please call main pharmacy at: [phone] 05/01/2016 1:28 PM

## 2016-05-01 NOTE — Progress Notes (Signed)
Received call from CMT that patient in torsade.  Patient asymptomatic and BP 155/89 HR 92.  Possible artifact.  MD notified.  Striped saved in epic under 3/31 admission date 15:43.

## 2016-05-02 DIAGNOSIS — N186 End stage renal disease: Secondary | ICD-10-CM | POA: Diagnosis not present

## 2016-05-02 DIAGNOSIS — N3000 Acute cystitis without hematuria: Secondary | ICD-10-CM

## 2016-05-02 DIAGNOSIS — Z4932 Encounter for adequacy testing for peritoneal dialysis: Secondary | ICD-10-CM | POA: Diagnosis not present

## 2016-05-02 DIAGNOSIS — N2581 Secondary hyperparathyroidism of renal origin: Secondary | ICD-10-CM | POA: Diagnosis not present

## 2016-05-02 DIAGNOSIS — D630 Anemia in neoplastic disease: Secondary | ICD-10-CM | POA: Diagnosis not present

## 2016-05-02 DIAGNOSIS — D509 Iron deficiency anemia, unspecified: Secondary | ICD-10-CM | POA: Diagnosis not present

## 2016-05-02 DIAGNOSIS — K769 Liver disease, unspecified: Secondary | ICD-10-CM | POA: Diagnosis not present

## 2016-05-02 LAB — CBC
HEMATOCRIT: 38.5 % — AB (ref 39.0–52.0)
HEMOGLOBIN: 12.4 g/dL — AB (ref 13.0–17.0)
MCH: 28.8 pg (ref 26.0–34.0)
MCHC: 32.2 g/dL (ref 30.0–36.0)
MCV: 89.5 fL (ref 78.0–100.0)
PLATELETS: 233 10*3/uL (ref 150–400)
RBC: 4.3 MIL/uL (ref 4.22–5.81)
RDW: 16.7 % — ABNORMAL HIGH (ref 11.5–15.5)
WBC: 6.9 10*3/uL (ref 4.0–10.5)

## 2016-05-02 LAB — URINE CULTURE

## 2016-05-02 LAB — COMPREHENSIVE METABOLIC PANEL
ALT: 21 U/L (ref 17–63)
ANION GAP: 12 (ref 5–15)
AST: 42 U/L — ABNORMAL HIGH (ref 15–41)
Albumin: 2 g/dL — ABNORMAL LOW (ref 3.5–5.0)
Alkaline Phosphatase: 79 U/L (ref 38–126)
BUN: 38 mg/dL — ABNORMAL HIGH (ref 6–20)
CHLORIDE: 95 mmol/L — AB (ref 101–111)
CO2: 22 mmol/L (ref 22–32)
Calcium: 9.2 mg/dL (ref 8.9–10.3)
Creatinine, Ser: 6.27 mg/dL — ABNORMAL HIGH (ref 0.61–1.24)
GFR, EST AFRICAN AMERICAN: 9 mL/min — AB (ref >60)
GFR, EST NON AFRICAN AMERICAN: 8 mL/min — AB (ref >60)
Glucose, Bld: 126 mg/dL — ABNORMAL HIGH (ref 65–99)
POTASSIUM: 3.7 mmol/L (ref 3.5–5.1)
SODIUM: 129 mmol/L — AB (ref 135–145)
Total Bilirubin: 0.5 mg/dL (ref 0.3–1.2)
Total Protein: 5.1 g/dL — ABNORMAL LOW (ref 6.5–8.1)
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
## 2016-05-02 NOTE — Progress Notes (Signed)
[facility] Progress Note   Subjective: up shaving, no c/o.   Vitals:   05/01/16 1702 05/01/16 2059 05/02/16 0439 05/02/16 1019  BP: (!) 168/83 (!) 190/100 (!) 155/92 (!) 142/79  Pulse: 90 99 93 95  Resp: 18 18 18 18   Temp: 97.8 F (36.6 C) 98.4 F (36.9 C) 98 F (36.7 C) 98.1 F (36.7 C)  TempSrc: Oral Oral Oral Oral  SpO2: 99% 97% 96% 98%  Weight:  63 kg (139 lb)    Height:        Inpatient medications: . calcitRIOL  0.75 mcg Oral Daily  . calcium carbonate  2 tablet Oral TID WC  . cholecalciferol  2,000 Units Oral Daily  . dialysis solution 2.5% low-MG/low-CA   Intraperitoneal Q24H  . divalproex  250 mg Oral Daily  . divalproex  500 mg Oral QHS  . feeding supplement (NEPRO CARB STEADY)  237 mL Oral BID BM  . feeding supplement (PRO-STAT SUGAR FREE 64)  30 mL Oral BID  . gentamicin cream  1 application Topical Daily  . haloperidol  2 mg Oral QHS  . heparin  5,000 Units Subcutaneous Q8H  . levothyroxine  50 mcg Oral QAC breakfast  . losartan  12.5 mg Oral QHS  . meropenem (MERREM) IV  500 mg Intravenous Q24H  . sodium chloride flush  3 mL Intravenous Q12H    acetaminophen **OR** acetaminophen  Exam: Elderly WD WN male, no distress No jvd Chest clear bilat RRR  Abd soft ntbd PD cath exit site LLQ clean and dry Ext no edema NF, stands up w/o help  Dialysis: CCPD 4 exchanges/ night, no day bag/ no pause, 90 min dwell , 3 L fill volume, usually 2.5% but if not eating 1.5%      Assessment: 1. MDR EColi UTI -  OP urology appt will be set up at dc for recurrent UTI.  Renal US showed atrophic kidneys only.   2. ESRD cont CCPD all 2.5% 3. HTN stable BP on losartan 4. MBD cont meds 5. Anemia of CKD - Hb up , no esa for now. Last mircera given 3/23 6. Hypokalemia - replacing, better 7. Hyponatremia - better 8. Volume - stable 9. Dispo - stable for dc from renal standpoint. Per primary.    Plan - cont CCPD, all 2.5%   Kelly Splinter MD [facility] pager [phone]   05/02/2016, 1:37 PM    Recent Labs Lab 04/29/16 0435 04/30/16 0637 05/01/16 0444 05/02/16 0707  NA 128* 127* 130* 129*  K 2.6* 2.9* 3.2* 3.7  CL 93* 93* 95* 95*  CO2 22 20* 20* 22  GLUCOSE 100* 110* 86 126*  BUN 38* 36* 35* 38*  CREATININE 6.63* 6.17* 6.11* 6.27*  CALCIUM 7.6* 8.2* 8.5* 9.2  PHOS 6.1* 5.6*  --   --     Recent Labs Lab 04/30/16 0637 05/01/16 0444 05/02/16 0707  AST 51* 39 42*  ALT 18 17 21   ALKPHOS 80 75 79  BILITOT 0.6 0.5 0.5  PROT 5.2* 4.9* 5.1*  ALBUMIN 2.2* 2.1* 2.0*    Recent Labs Lab 04/27/16 2038 04/28/16 1008  04/30/16 0637 05/01/16 0444 05/02/16 0707  WBC 12.5* 13.3*  < > 8.2 6.5 6.9  NEUTROABS 10.7* 10.6*  --   --   --   --   HGB 14.2 13.5  < > 12.3* 11.3* 12.4*  HCT 43.2 41.6  < > 38.4* 34.5* 38.5*  MCV 88.7 89.8  < >  89.1 90.3 89.5  PLT 248 249  < > 263 217 233  < > = values in this interval not displayed. Iron/TIBC/Ferritin/ %Sat    Component Value Date/Time   IRON 95 03/06/2016 0846   TIBC 153 (L) 03/06/2016 0846   FERRITIN 974 (H) 07/26/2006 1340   IRONPCTSAT 62 (H) 03/06/2016 3612

## 2016-05-03 DIAGNOSIS — N2581 Secondary hyperparathyroidism of renal origin: Secondary | ICD-10-CM | POA: Diagnosis not present

## 2016-05-03 DIAGNOSIS — Z4932 Encounter for adequacy testing for peritoneal dialysis: Secondary | ICD-10-CM | POA: Diagnosis not present

## 2016-05-03 DIAGNOSIS — K769 Liver disease, unspecified: Secondary | ICD-10-CM | POA: Diagnosis not present

## 2016-05-03 DIAGNOSIS — D630 Anemia in neoplastic disease: Secondary | ICD-10-CM | POA: Diagnosis not present

## 2016-05-03 DIAGNOSIS — D509 Iron deficiency anemia, unspecified: Secondary | ICD-10-CM | POA: Diagnosis not present

## 2016-05-03 DIAGNOSIS — N186 End stage renal disease: Secondary | ICD-10-CM | POA: Diagnosis not present

## 2016-05-03 LAB — CULTURE, BLOOD (ROUTINE X 2)
CULTURE: NO GROWTH
Culture: NO GROWTH

## 2016-05-03 LAB — COMPREHENSIVE METABOLIC PANEL
ALBUMIN: 2.1 g/dL — AB (ref 3.5–5.0)
ALT: 22 U/L (ref 17–63)
AST: 39 U/L (ref 15–41)
Alkaline Phosphatase: 76 U/L (ref 38–126)
Anion gap: 12 (ref 5–15)
BILIRUBIN TOTAL: 0.5 mg/dL (ref 0.3–1.2)
BUN: 42 mg/dL — ABNORMAL HIGH (ref 6–20)
CO2: 22 mmol/L (ref 22–32)
Calcium: 9.5 mg/dL (ref 8.9–10.3)
Chloride: 93 mmol/L — ABNORMAL LOW (ref 101–111)
Creatinine, Ser: 6.57 mg/dL — ABNORMAL HIGH (ref 0.61–1.24)
GFR calc Af Amer: 9 mL/min — ABNORMAL LOW (ref >60)
GFR, EST NON AFRICAN AMERICAN: 8 mL/min — AB (ref >60)
GLUCOSE: 123 mg/dL — AB (ref 65–99)
POTASSIUM: 3.4 mmol/L — AB (ref 3.5–5.1)
Sodium: 127 mmol/L — ABNORMAL LOW (ref 135–145)
TOTAL PROTEIN: 5.2 g/dL — AB (ref 6.5–8.1)

## 2016-05-03 LAB — CULTURE, BODY FLUID-BOTTLE: CULTURE: NO GROWTH

## 2016-05-03 LAB — CULTURE, BODY FLUID W GRAM STAIN -BOTTLE

## 2016-05-03 MED ORDER — FAMOTIDINE 20 MG PO TABS
20.0000 mg | ORAL_TABLET | Freq: Every day | ORAL | Status: DC | PRN
  Administered 2016-05-03: 20 mg via ORAL
  Filled 2016-05-03: qty 1
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
## 2016-05-03 NOTE — Progress Notes (Signed)
[facility] Progress Note   Subjective: no c/o  Vitals:   05/02/16 1717 05/02/16 2120 05/03/16 0629 05/03/16 1135  BP: (!) 155/77 (!) 179/98 127/71 135/68  Pulse: 93 96 99 97  Resp: 17 16 17 18   Temp: 98.2 F (36.8 C) 97.7 F (36.5 C) 98.5 F (36.9 C) 98.2 F (36.8 C)  TempSrc: Oral Axillary Oral Oral  SpO2: 98% 97% 97% 97%  Weight:  64.9 kg (143 lb)    Height:        Inpatient medications: . calcitRIOL  0.75 mcg Oral Daily  . calcium carbonate  2 tablet Oral TID WC  . cholecalciferol  2,000 Units Oral Daily  . dialysis solution 2.5% low-MG/low-CA   Intraperitoneal Q24H  . divalproex  250 mg Oral Daily  . divalproex  500 mg Oral QHS  . feeding supplement (NEPRO CARB STEADY)  237 mL Oral BID BM  . feeding supplement (PRO-STAT SUGAR FREE 64)  30 mL Oral BID  . gentamicin cream  1 application Topical Daily  . haloperidol  2 mg Oral QHS  . heparin  5,000 Units Subcutaneous Q8H  . levothyroxine  50 mcg Oral QAC breakfast  . losartan  12.5 mg Oral QHS  . meropenem (MERREM) IV  500 mg Intravenous Q24H  . sodium chloride flush  3 mL Intravenous Q12H    acetaminophen **OR** acetaminophen  Exam: Elderly WD WN male, no distress No jvd Chest clear bilat RRR  Abd soft ntbd PD cath exit site LLQ clean and dry Ext no edema NF, stands up w/o help  Dialysis: CCPD 4 exchanges/ night, no day bag/ no pause, 90 min dwell , 3 L fill volume, usually 2.5% but if not eating 1.5%      Assessment: 1. MDR EColi UTI -  OP urology appt will be set up at dc for recurrent UTI 2. ESRD cont CCPD all 2.5% 3. HTN stable BP on losartan 4. MBD cont meds 5. Anemia of CKD - Hb up , no esa for now. Last mircera given 3/23 6. Hypokalemia - replacing, better 7. Hyponatremia - better 8. Volume - stable 9. Dispo - for dc home tomorrow after course of IV abx completed.    Plan - cont CCPD, all 2.5%. For dc tomorrow.    Kelly Splinter MD [facility] pager  [phone]   05/03/2016, 1:00 PM    Recent Labs Lab 04/29/16 0435 04/30/16 0637 05/01/16 0444 05/02/16 0707 05/03/16 0521  NA 128* 127* 130* 129* 127*  K 2.6* 2.9* 3.2* 3.7 3.4*  CL 93* 93* 95* 95* 93*  CO2 22 20* 20* 22 22  GLUCOSE 100* 110* 86 126* 123*  BUN 38* 36* 35* 38* 42*  CREATININE 6.63* 6.17* 6.11* 6.27* 6.57*  CALCIUM 7.6* 8.2* 8.5* 9.2 9.5  PHOS 6.1* 5.6*  --   --   --     Recent Labs Lab 05/01/16 0444 05/02/16 0707 05/03/16 0521  AST 39 42* 39  ALT 17 21 22   ALKPHOS 75 79 76  BILITOT 0.5 0.5 0.5  PROT 4.9* 5.1* 5.2*  ALBUMIN 2.1* 2.0* 2.1*    Recent Labs Lab 04/27/16 2038 04/28/16 1008  04/30/16 0637 05/01/16 0444 05/02/16 0707  WBC 12.5* 13.3*  < > 8.2 6.5 6.9  NEUTROABS 10.7* 10.6*  --   --   --   --   HGB 14.2 13.5  < > 12.3* 11.3* 12.4*  HCT 43.2 41.6  < > 38.4* 34.5* 38.5*  MCV  88.7 89.8  < > 89.1 90.3 89.5  PLT 248 249  < > 263 217 233  < > = values in this interval not displayed. Iron/TIBC/Ferritin/ %Sat    Component Value Date/Time   IRON 95 03/06/2016 0846   TIBC 153 (L) 03/06/2016 0846   FERRITIN 974 (H) 07/26/2006 1340   IRONPCTSAT 62 (H) 03/06/2016 3244
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
## 2016-05-03 NOTE — Progress Notes (Addendum)
Pt complains of indigestion. Pt had 2 Tums with dinner. No PRN's ordered. Page to Tylene Fantasia for recommendations. New order for Pepcid PRN  Melrose Kearse Eustace Pen, RN
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
## 2016-05-04 DIAGNOSIS — K769 Liver disease, unspecified: Secondary | ICD-10-CM | POA: Diagnosis not present

## 2016-05-04 DIAGNOSIS — N2581 Secondary hyperparathyroidism of renal origin: Secondary | ICD-10-CM | POA: Diagnosis not present

## 2016-05-04 DIAGNOSIS — D509 Iron deficiency anemia, unspecified: Secondary | ICD-10-CM | POA: Diagnosis not present

## 2016-05-04 DIAGNOSIS — N186 End stage renal disease: Secondary | ICD-10-CM | POA: Diagnosis not present

## 2016-05-04 DIAGNOSIS — D630 Anemia in neoplastic disease: Secondary | ICD-10-CM | POA: Diagnosis not present

## 2016-05-04 DIAGNOSIS — Z4932 Encounter for adequacy testing for peritoneal dialysis: Secondary | ICD-10-CM | POA: Diagnosis not present

## 2016-05-04 MED ORDER — NEPRO/CARBSTEADY PO LIQD: 237.0000 mL | Freq: Two times a day (BID) | ORAL | 0 refills | Status: DC

## 2016-05-04 MED ORDER — LOSARTAN POTASSIUM 25 MG PO TABS: 12.5000 mg | ORAL_TABLET | Freq: Every day | ORAL | 0 refills | Status: DC
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
## 2016-05-04 NOTE — Progress Notes (Signed)
Discharge instructions and medications discussed with patient.  All questions answered.  

## 2016-05-04 NOTE — Progress Notes (Signed)
12 staples removed from head per MD order. Patient tolerated well.

## 2016-05-04 NOTE — Care Management Important Message (Signed)
Important Message  Patient Details  Name: Darren Dunn MRN: [id number] Date of Birth: 11/26/1945   Medicare Important Message Given:  Yes    [name], [name], RN 05/04/2016, 1:55 PM

## 2016-05-04 NOTE — Progress Notes (Signed)
[facility] Progress Note   Subjective: no c/o  Vitals:   05/03/16 1907 05/03/16 2145 05/04/16 0437 05/04/16 0926  BP: 140/77 (!) 174/91 (!) 155/83 119/66  Pulse: 90 93 95 99  Resp: 18 17 17 18   Temp: 98 F (36.7 C) 97.7 F (36.5 C) 98.4 F (36.9 C) 98.5 F (36.9 C)  TempSrc: Oral Oral Oral Oral  SpO2: 98% 93% 93% 96%  Weight:  62.5 kg (137 lb 11.2 oz)    Height:        Inpatient medications: . calcitRIOL  0.75 mcg Oral Daily  . calcium carbonate  2 tablet Oral TID WC  . cholecalciferol  2,000 Units Oral Daily  . dialysis solution 2.5% low-MG/low-CA   Intraperitoneal Q24H  . divalproex  250 mg Oral Daily  . divalproex  500 mg Oral QHS  . feeding supplement (NEPRO CARB STEADY)  237 mL Oral BID BM  . feeding supplement (PRO-STAT SUGAR FREE 64)  30 mL Oral BID  . gentamicin cream  1 application Topical Daily  . haloperidol  2 mg Oral QHS  . heparin  5,000 Units Subcutaneous Q8H  . levothyroxine  50 mcg Oral QAC breakfast  . losartan  12.5 mg Oral QHS  . meropenem (MERREM) IV  500 mg Intravenous Q24H  . sodium chloride flush  3 mL Intravenous Q12H    acetaminophen **OR** acetaminophen, famotidine  Exam: Elderly WD WN male, no distress No jvd Chest clear bilat RRR  Abd soft ntbd PD cath exit site LLQ clean and dry Ext no edema NF, stands up w/o help  Dialysis: CCPD 4 exchanges/ night, no day bag/ no pause, 90 min dwell , 3 L fill volume, usually 2.5% but if not eating 1.5%      Assessment: 1. MDR EColi UTI -  OP urology appt will be set up at dc for recurrent UTI 2. ESRD cont CCPD all 2.5% 3. HTN stable BP on losartan 4. MBD cont meds 5. Anemia of CKD - Hb up , no esa for now. Last mircera given 3/23 6. Hypokalemia - replacing, better 7. Hyponatremia - better 8. Volume - stable 9. Dispo - for dc home today   Plan - for dc today   Kelly Splinter MD [facility] pager [phone]   05/04/2016, 2:34 PM    Recent Labs Lab  04/29/16 0435 04/30/16 0637 05/01/16 0444 05/02/16 0707 05/03/16 0521  NA 128* 127* 130* 129* 127*  K 2.6* 2.9* 3.2* 3.7 3.4*  CL 93* 93* 95* 95* 93*  CO2 22 20* 20* 22 22  GLUCOSE 100* 110* 86 126* 123*  BUN 38* 36* 35* 38* 42*  CREATININE 6.63* 6.17* 6.11* 6.27* 6.57*  CALCIUM 7.6* 8.2* 8.5* 9.2 9.5  PHOS 6.1* 5.6*  --   --   --     Recent Labs Lab 05/01/16 0444 05/02/16 0707 05/03/16 0521  AST 39 42* 39  ALT 17 21 22   ALKPHOS 75 79 76  BILITOT 0.5 0.5 0.5  PROT 4.9* 5.1* 5.2*  ALBUMIN 2.1* 2.0* 2.1*    Recent Labs Lab 04/27/16 2038 04/28/16 1008  04/30/16 0637 05/01/16 0444 05/02/16 0707  WBC 12.5* 13.3*  < > 8.2 6.5 6.9  NEUTROABS 10.7* 10.6*  --   --   --   --   HGB 14.2 13.5  < > 12.3* 11.3* 12.4*  HCT 43.2 41.6  < > 38.4* 34.5* 38.5*  MCV 88.7 89.8  < > 89.1 90.3 89.5  PLT 248 249  < > 263 217 233  < > = values in this interval not displayed. Iron/TIBC/Ferritin/ %Sat    Component Value Date/Time   IRON 95 03/06/2016 0846   TIBC 153 (L) 03/06/2016 0846   FERRITIN 974 (H) 07/26/2006 1340   IRONPCTSAT 62 (H) 03/06/2016 4621

## 2016-05-04 NOTE — Care Management Note (Signed)
Case Management Note  Patient Details  Name: Darren Dunn MRN: [id number] Date of Birth: [birth year]  Subjective/Objective:        CM following for progression and d/c planning.             Action/Plan: 05/04/16 Spoke with pt who will d/c today, reminded pt of plan for [facility], PT and OT with [facility], and call placed to [facility] rep Edwina to review needs and inform of plan to d/c to home today.   Expected Discharge Date:       05/04/2016           Expected Discharge Plan:  [place]  In-House Referral:  NA  Discharge planning Services  CM Consult  Post Acute Care Choice:  Home Health Choice offered to:  Adult Children (Sons, Darren Dunn and Darren Dunn at bedside. )  DME Arranged:  N/A (Has RW and 3n1 BSC) DME Agency:  NA  HH Arranged:  PT, OT, RN [place] Agency:  [place]  Status of Service:  Completed, signed off  If discussed at [place] of Stay Meetings, dates discussed:    Additional Comments:  Adron Bene, RN 05/04/2016, 11:32 AM

## 2016-05-05 DIAGNOSIS — N2581 Secondary hyperparathyroidism of renal origin: Secondary | ICD-10-CM | POA: Diagnosis not present

## 2016-05-05 DIAGNOSIS — N186 End stage renal disease: Secondary | ICD-10-CM | POA: Diagnosis not present

## 2016-05-05 DIAGNOSIS — K769 Liver disease, unspecified: Secondary | ICD-10-CM | POA: Diagnosis not present

## 2016-05-05 DIAGNOSIS — D509 Iron deficiency anemia, unspecified: Secondary | ICD-10-CM | POA: Diagnosis not present

## 2016-05-05 DIAGNOSIS — Z4932 Encounter for adequacy testing for peritoneal dialysis: Secondary | ICD-10-CM | POA: Diagnosis not present

## 2016-05-05 DIAGNOSIS — D630 Anemia in neoplastic disease: Secondary | ICD-10-CM | POA: Diagnosis not present

## 2016-05-05 DIAGNOSIS — B9620 Unspecified Escherichia coli [E. coli] as the cause of diseases classified elsewhere: Secondary | ICD-10-CM | POA: Diagnosis not present

## 2016-05-05 DIAGNOSIS — N390 Urinary tract infection, site not specified: Secondary | ICD-10-CM | POA: Diagnosis not present

## 2016-05-06 DIAGNOSIS — N186 End stage renal disease: Secondary | ICD-10-CM | POA: Diagnosis not present

## 2016-05-06 DIAGNOSIS — N390 Urinary tract infection, site not specified: Secondary | ICD-10-CM | POA: Diagnosis not present

## 2016-05-06 DIAGNOSIS — N2581 Secondary hyperparathyroidism of renal origin: Secondary | ICD-10-CM | POA: Diagnosis not present

## 2016-05-06 DIAGNOSIS — D509 Iron deficiency anemia, unspecified: Secondary | ICD-10-CM | POA: Diagnosis not present

## 2016-05-06 DIAGNOSIS — D630 Anemia in neoplastic disease: Secondary | ICD-10-CM | POA: Diagnosis not present

## 2016-05-06 DIAGNOSIS — Z4932 Encounter for adequacy testing for peritoneal dialysis: Secondary | ICD-10-CM | POA: Diagnosis not present

## 2016-05-06 DIAGNOSIS — B9620 Unspecified Escherichia coli [E. coli] as the cause of diseases classified elsewhere: Secondary | ICD-10-CM | POA: Diagnosis not present

## 2016-05-06 DIAGNOSIS — K769 Liver disease, unspecified: Secondary | ICD-10-CM | POA: Diagnosis not present

## 2016-05-07 DIAGNOSIS — D509 Iron deficiency anemia, unspecified: Secondary | ICD-10-CM | POA: Diagnosis not present

## 2016-05-07 DIAGNOSIS — K769 Liver disease, unspecified: Secondary | ICD-10-CM | POA: Diagnosis not present

## 2016-05-07 DIAGNOSIS — N186 End stage renal disease: Secondary | ICD-10-CM | POA: Diagnosis not present

## 2016-05-07 DIAGNOSIS — E784 Other hyperlipidemia: Secondary | ICD-10-CM | POA: Diagnosis not present

## 2016-05-07 DIAGNOSIS — N2581 Secondary hyperparathyroidism of renal origin: Secondary | ICD-10-CM | POA: Diagnosis not present

## 2016-05-07 DIAGNOSIS — Z4932 Encounter for adequacy testing for peritoneal dialysis: Secondary | ICD-10-CM | POA: Diagnosis not present

## 2016-05-07 DIAGNOSIS — R8299 Other abnormal findings in urine: Secondary | ICD-10-CM | POA: Diagnosis not present

## 2016-05-07 DIAGNOSIS — D630 Anemia in neoplastic disease: Secondary | ICD-10-CM | POA: Diagnosis not present

## 2016-05-08 ENCOUNTER — Telehealth: Payer: Self-pay | Admitting: *Deleted

## 2016-05-08 DIAGNOSIS — B9620 Unspecified Escherichia coli [E. coli] as the cause of diseases classified elsewhere: Secondary | ICD-10-CM | POA: Diagnosis not present

## 2016-05-08 DIAGNOSIS — Z4932 Encounter for adequacy testing for peritoneal dialysis: Secondary | ICD-10-CM | POA: Diagnosis not present

## 2016-05-08 DIAGNOSIS — K769 Liver disease, unspecified: Secondary | ICD-10-CM | POA: Diagnosis not present

## 2016-05-08 DIAGNOSIS — D630 Anemia in neoplastic disease: Secondary | ICD-10-CM | POA: Diagnosis not present

## 2016-05-08 DIAGNOSIS — N2581 Secondary hyperparathyroidism of renal origin: Secondary | ICD-10-CM | POA: Diagnosis not present

## 2016-05-08 DIAGNOSIS — D509 Iron deficiency anemia, unspecified: Secondary | ICD-10-CM | POA: Diagnosis not present

## 2016-05-08 DIAGNOSIS — N390 Urinary tract infection, site not specified: Secondary | ICD-10-CM | POA: Diagnosis not present

## 2016-05-08 DIAGNOSIS — N186 End stage renal disease: Secondary | ICD-10-CM | POA: Diagnosis not present

## 2016-05-08 NOTE — Telephone Encounter (Signed)
Says he has diarrhea since yesterday and lost 8 lbs in one day. He had peritoneal dialysis last night, but should not have lost that much weight. Please advise

## 2016-05-08 NOTE — Telephone Encounter (Signed)
Please contact the patient use OTC Lomtil for 1-2 days. If not better com in to acute clinic

## 2016-05-09 DIAGNOSIS — N2581 Secondary hyperparathyroidism of renal origin: Secondary | ICD-10-CM | POA: Diagnosis not present

## 2016-05-09 DIAGNOSIS — D630 Anemia in neoplastic disease: Secondary | ICD-10-CM | POA: Diagnosis not present

## 2016-05-09 DIAGNOSIS — Z4932 Encounter for adequacy testing for peritoneal dialysis: Secondary | ICD-10-CM | POA: Diagnosis not present

## 2016-05-09 DIAGNOSIS — N390 Urinary tract infection, site not specified: Secondary | ICD-10-CM | POA: Diagnosis not present

## 2016-05-09 DIAGNOSIS — D509 Iron deficiency anemia, unspecified: Secondary | ICD-10-CM | POA: Diagnosis not present

## 2016-05-09 DIAGNOSIS — B9620 Unspecified Escherichia coli [E. coli] as the cause of diseases classified elsewhere: Secondary | ICD-10-CM | POA: Diagnosis not present

## 2016-05-09 DIAGNOSIS — K769 Liver disease, unspecified: Secondary | ICD-10-CM | POA: Diagnosis not present

## 2016-05-09 DIAGNOSIS — N186 End stage renal disease: Secondary | ICD-10-CM | POA: Diagnosis not present

## 2016-05-09 NOTE — Telephone Encounter (Signed)
[email or web address]

## 2016-05-10 DIAGNOSIS — D630 Anemia in neoplastic disease: Secondary | ICD-10-CM | POA: Diagnosis not present

## 2016-05-10 DIAGNOSIS — D509 Iron deficiency anemia, unspecified: Secondary | ICD-10-CM | POA: Diagnosis not present

## 2016-05-10 DIAGNOSIS — N2581 Secondary hyperparathyroidism of renal origin: Secondary | ICD-10-CM | POA: Diagnosis not present

## 2016-05-10 DIAGNOSIS — K769 Liver disease, unspecified: Secondary | ICD-10-CM | POA: Diagnosis not present

## 2016-05-10 DIAGNOSIS — N186 End stage renal disease: Secondary | ICD-10-CM | POA: Diagnosis not present

## 2016-05-10 DIAGNOSIS — Z4932 Encounter for adequacy testing for peritoneal dialysis: Secondary | ICD-10-CM | POA: Diagnosis not present

## 2016-05-11 ENCOUNTER — Ambulatory Visit: Payer: Medicare Other | Admitting: Family Medicine

## 2016-05-11 DIAGNOSIS — N186 End stage renal disease: Secondary | ICD-10-CM | POA: Diagnosis not present

## 2016-05-11 DIAGNOSIS — D630 Anemia in neoplastic disease: Secondary | ICD-10-CM | POA: Diagnosis not present

## 2016-05-11 DIAGNOSIS — N2581 Secondary hyperparathyroidism of renal origin: Secondary | ICD-10-CM | POA: Diagnosis not present

## 2016-05-11 DIAGNOSIS — D509 Iron deficiency anemia, unspecified: Secondary | ICD-10-CM | POA: Diagnosis not present

## 2016-05-11 DIAGNOSIS — K769 Liver disease, unspecified: Secondary | ICD-10-CM | POA: Diagnosis not present

## 2016-05-11 DIAGNOSIS — Z4932 Encounter for adequacy testing for peritoneal dialysis: Secondary | ICD-10-CM | POA: Diagnosis not present

## 2016-05-12 DIAGNOSIS — D630 Anemia in neoplastic disease: Secondary | ICD-10-CM | POA: Diagnosis not present

## 2016-05-12 DIAGNOSIS — Z4932 Encounter for adequacy testing for peritoneal dialysis: Secondary | ICD-10-CM | POA: Diagnosis not present

## 2016-05-12 DIAGNOSIS — K769 Liver disease, unspecified: Secondary | ICD-10-CM | POA: Diagnosis not present

## 2016-05-12 DIAGNOSIS — N2581 Secondary hyperparathyroidism of renal origin: Secondary | ICD-10-CM | POA: Diagnosis not present

## 2016-05-12 DIAGNOSIS — N186 End stage renal disease: Secondary | ICD-10-CM | POA: Diagnosis not present

## 2016-05-12 DIAGNOSIS — D509 Iron deficiency anemia, unspecified: Secondary | ICD-10-CM | POA: Diagnosis not present

## 2016-05-13 DIAGNOSIS — N2581 Secondary hyperparathyroidism of renal origin: Secondary | ICD-10-CM | POA: Diagnosis not present

## 2016-05-13 DIAGNOSIS — Z4932 Encounter for adequacy testing for peritoneal dialysis: Secondary | ICD-10-CM | POA: Diagnosis not present

## 2016-05-13 DIAGNOSIS — K769 Liver disease, unspecified: Secondary | ICD-10-CM | POA: Diagnosis not present

## 2016-05-13 DIAGNOSIS — N186 End stage renal disease: Secondary | ICD-10-CM | POA: Diagnosis not present

## 2016-05-13 DIAGNOSIS — D509 Iron deficiency anemia, unspecified: Secondary | ICD-10-CM | POA: Diagnosis not present

## 2016-05-13 DIAGNOSIS — D630 Anemia in neoplastic disease: Secondary | ICD-10-CM | POA: Diagnosis not present

## 2016-05-14 DIAGNOSIS — Z4932 Encounter for adequacy testing for peritoneal dialysis: Secondary | ICD-10-CM | POA: Diagnosis not present

## 2016-05-14 DIAGNOSIS — N186 End stage renal disease: Secondary | ICD-10-CM | POA: Diagnosis not present

## 2016-05-14 DIAGNOSIS — N390 Urinary tract infection, site not specified: Secondary | ICD-10-CM | POA: Diagnosis not present

## 2016-05-14 DIAGNOSIS — K769 Liver disease, unspecified: Secondary | ICD-10-CM | POA: Diagnosis not present

## 2016-05-14 DIAGNOSIS — N2581 Secondary hyperparathyroidism of renal origin: Secondary | ICD-10-CM | POA: Diagnosis not present

## 2016-05-14 DIAGNOSIS — B9620 Unspecified Escherichia coli [E. coli] as the cause of diseases classified elsewhere: Secondary | ICD-10-CM | POA: Diagnosis not present

## 2016-05-14 DIAGNOSIS — D630 Anemia in neoplastic disease: Secondary | ICD-10-CM | POA: Diagnosis not present

## 2016-05-14 DIAGNOSIS — D509 Iron deficiency anemia, unspecified: Secondary | ICD-10-CM | POA: Diagnosis not present

## 2016-05-15 DIAGNOSIS — D509 Iron deficiency anemia, unspecified: Secondary | ICD-10-CM | POA: Diagnosis not present

## 2016-05-15 DIAGNOSIS — B9620 Unspecified Escherichia coli [E. coli] as the cause of diseases classified elsewhere: Secondary | ICD-10-CM | POA: Diagnosis not present

## 2016-05-15 DIAGNOSIS — K769 Liver disease, unspecified: Secondary | ICD-10-CM | POA: Diagnosis not present

## 2016-05-15 DIAGNOSIS — Z4932 Encounter for adequacy testing for peritoneal dialysis: Secondary | ICD-10-CM | POA: Diagnosis not present

## 2016-05-15 DIAGNOSIS — N2581 Secondary hyperparathyroidism of renal origin: Secondary | ICD-10-CM | POA: Diagnosis not present

## 2016-05-15 DIAGNOSIS — N186 End stage renal disease: Secondary | ICD-10-CM | POA: Diagnosis not present

## 2016-05-15 DIAGNOSIS — D630 Anemia in neoplastic disease: Secondary | ICD-10-CM | POA: Diagnosis not present

## 2016-05-15 DIAGNOSIS — N390 Urinary tract infection, site not specified: Secondary | ICD-10-CM | POA: Diagnosis not present

## 2016-05-16 DIAGNOSIS — Z4932 Encounter for adequacy testing for peritoneal dialysis: Secondary | ICD-10-CM | POA: Diagnosis not present

## 2016-05-16 DIAGNOSIS — D630 Anemia in neoplastic disease: Secondary | ICD-10-CM | POA: Diagnosis not present

## 2016-05-16 DIAGNOSIS — N2581 Secondary hyperparathyroidism of renal origin: Secondary | ICD-10-CM | POA: Diagnosis not present

## 2016-05-16 DIAGNOSIS — N186 End stage renal disease: Secondary | ICD-10-CM | POA: Diagnosis not present

## 2016-05-16 DIAGNOSIS — D509 Iron deficiency anemia, unspecified: Secondary | ICD-10-CM | POA: Diagnosis not present

## 2016-05-16 DIAGNOSIS — K769 Liver disease, unspecified: Secondary | ICD-10-CM | POA: Diagnosis not present

## 2016-05-16 NOTE — Telephone Encounter (Signed)
Patient aware of recommendation.  

## 2016-05-17 DIAGNOSIS — N186 End stage renal disease: Secondary | ICD-10-CM | POA: Diagnosis not present

## 2016-05-17 DIAGNOSIS — D509 Iron deficiency anemia, unspecified: Secondary | ICD-10-CM | POA: Diagnosis not present

## 2016-05-17 DIAGNOSIS — D630 Anemia in neoplastic disease: Secondary | ICD-10-CM | POA: Diagnosis not present

## 2016-05-17 DIAGNOSIS — B9620 Unspecified Escherichia coli [E. coli] as the cause of diseases classified elsewhere: Secondary | ICD-10-CM | POA: Diagnosis not present

## 2016-05-17 DIAGNOSIS — N2581 Secondary hyperparathyroidism of renal origin: Secondary | ICD-10-CM | POA: Diagnosis not present

## 2016-05-17 DIAGNOSIS — K769 Liver disease, unspecified: Secondary | ICD-10-CM | POA: Diagnosis not present

## 2016-05-17 DIAGNOSIS — Z4932 Encounter for adequacy testing for peritoneal dialysis: Secondary | ICD-10-CM | POA: Diagnosis not present

## 2016-05-17 DIAGNOSIS — N390 Urinary tract infection, site not specified: Secondary | ICD-10-CM | POA: Diagnosis not present

## 2016-05-18 ENCOUNTER — Encounter: Payer: Self-pay | Admitting: Family Medicine

## 2016-05-18 ENCOUNTER — Ambulatory Visit (INDEPENDENT_AMBULATORY_CARE_PROVIDER_SITE_OTHER): Payer: Medicare Other | Admitting: Family Medicine

## 2016-05-18 VITALS — BP 157/103 | HR 91 | Ht 69.0 in | Wt 129.0 lb

## 2016-05-18 DIAGNOSIS — D509 Iron deficiency anemia, unspecified: Secondary | ICD-10-CM | POA: Diagnosis not present

## 2016-05-18 DIAGNOSIS — E039 Hypothyroidism, unspecified: Secondary | ICD-10-CM | POA: Diagnosis not present

## 2016-05-18 DIAGNOSIS — N186 End stage renal disease: Secondary | ICD-10-CM | POA: Diagnosis not present

## 2016-05-18 DIAGNOSIS — R197 Diarrhea, unspecified: Secondary | ICD-10-CM | POA: Diagnosis not present

## 2016-05-18 DIAGNOSIS — D630 Anemia in neoplastic disease: Secondary | ICD-10-CM | POA: Diagnosis not present

## 2016-05-18 DIAGNOSIS — N2581 Secondary hyperparathyroidism of renal origin: Secondary | ICD-10-CM | POA: Diagnosis not present

## 2016-05-18 DIAGNOSIS — Z4932 Encounter for adequacy testing for peritoneal dialysis: Secondary | ICD-10-CM | POA: Diagnosis not present

## 2016-05-18 DIAGNOSIS — K769 Liver disease, unspecified: Secondary | ICD-10-CM | POA: Diagnosis not present

## 2016-05-18 NOTE — Patient Instructions (Signed)
Gluten-Free Diet for Celiac Disease, Adult The gluten-free diet includes all foods that do not contain gluten. Gluten is a protein that is found in wheat, rye, barley, and some other grains. Following the gluten-free diet is the only treatment for people with celiac disease. It helps to prevent damage to the intestines and improves or eliminates the symptoms of celiac disease. Following the gluten-free diet requires some planning. It can be challenging at first, but it gets easier with time and practice. There are more gluten-free options available today than ever before. If you need help finding gluten-free foods or if you have questions, talk with your diet and nutrition specialist (registered dietitian) or your health care provider. What do I need to know about a gluten-free diet?  All fruits, vegetables, and meats are safe to eat and do not contain gluten.  When grocery shopping, start by shopping in the produce, meat, and dairy sections. These sections are more likely to contain gluten-free foods. Then move to the aisles that contain packaged foods if you need to.  Read all food labels. Gluten is often added to foods. Always check the ingredient list and look for warnings, such as "may contain gluten."  Talk with your dietitian or health care provider before taking a gluten-free multivitamin or mineral supplement.  Be aware of gluten-free foods having contact with foods that contain gluten (cross-contamination). This can happen at home and with any processed foods.  Talk with your health care provider or dietitian about how to reduce the risk of cross-contamination in your home.  If you have questions about how a food is processed, ask the manufacturer. What key words help to identify gluten? Foods that list any of these key words on the label usually contain gluten:  Wheat, flour, enriched flour, bromated flour, white flour, durum flour, graham flour, phosphated flour, self-rising flour,  semolina, farina, barley (malt), rye, and oats.  Starch, dextrin, modified food starch, or cereal.  Thickening, fillers, or emulsifiers.  Malt flavoring, malt extract, or malt syrup.  Hydrolyzed vegetable protein. In the [place] packaged foods that are gluten-free are required to be labeled "GF." These foods should be easy to identify and are safe to eat. In the [place] food companies are also required to list common food allergens, including wheat, on their labels. Recommended foods Grains  Amaranth, bean flours, 100% buckwheat flour, corn, millet, nut flours or nut meals, GF oats, quinoa, rice, sorghum, teff, rice wafers, pure cornmeal tortillas, popcorn, and hot cereals made from cornmeal. Hominy, rice, wild rice. Some Asian rice noodles or bean noodles. Arrowroot starch, corn bran, corn flour, corn germ, cornmeal, corn starch, potato flour, potato starch flour, and rice bran. Plain, brown, and sweet rice flours. Rice polish, soy flour, and tapioca starch. Vegetables  All plain fresh, frozen, and canned vegetables. Fruits  All plain fresh, frozen, canned, and dried fruits, and 100% fruit juices. Meats and other protein foods  All fresh beef, pork, poultry, fish, seafood, and eggs. Fish canned in water, oil, brine, or vegetable broth. Plain nuts and seeds, peanut butter. Some lunch meat and some frankfurters. Dried beans, dried peas, and lentils. Dairy  Fresh plain, dry, evaporated, or condensed milk. Cream, butter, sour cream, whipping cream, and most yogurts. Unprocessed cheese, most processed cheeses, some cottage cheese, some cream cheeses. Beverages  Coffee, tea, most herbal teas. Carbonated beverages and some root beers. Wine, sake, and distilled spirits, such as gin, vodka, and whiskey. Most hard ciders. Fats and oils  Butter, margarine,   vegetable oil, hydrogenated butter, olive oil, shortening, lard, cream, and some mayonnaise. Some commercial salad dressings. Olives. Sweets and  desserts  Sugar, honey, some syrups, molasses, jelly, and jam. Plain hard candy, marshmallows, and gumdrops. Pure cocoa powder. Plain chocolate. Custard and some pudding mixes. Gelatin desserts, sorbets, frozen ice pops, and sherbet. Cake, cookies, and other desserts prepared with allowed flours. Some commercial ice creams. Cornstarch, tapioca, and rice puddings. Seasoning and other foods  Some canned or frozen soups. Monosodium glutamate (MSG). Cider, rice, and wine vinegar. Baking soda and baking powder. Cream of tartar. Baking and nutritional yeast. Certain soy sauces made without wheat (ask your dietitian about specific brands that are allowed). Nuts, coconut, and chocolate. Salt, pepper, herbs, spices, flavoring extracts, imitation or artificial flavorings, natural flavorings, and food colorings. Some medicines and supplements. Some lip glosses and other cosmetics. Rice syrups. The items listed may not be a complete list. Talk with your dietitian about what dietary choices are best for you. Foods to avoid Grains  Barley, bran, bulgur, couscous, cracked wheat, [place], farro, graham, malt, matzo, semolina, wheat germ, and all wheat and rye cereals including spelt and kamut. Cereals containing malt as a flavoring, such as rice cereal. Noodles, spaghetti, macaroni, most packaged rice mixes, and all mixes containing wheat, rye, barley, or triticale. Vegetables  Most creamed vegetables and most vegetables canned in sauces. Some commercially prepared vegetables and salads. Fruits  Thickened or prepared fruits and some pie fillings. Some fruit snacks and fruit roll-ups. Meats and other protein foods  Any meat or meat alternative containing wheat, rye, barley, or gluten stabilizers. These are often marinated or packaged meats and lunch meats. Bread-containing products, such as Swiss steak, croquettes, meatballs, and meatloaf. Most tuna canned in vegetable broth and turkey with hydrolyzed vegetable  protein (HVP) injected as part of the basting. Seitan. Imitation fish. Eggs in sauces made from ingredients to avoid. Dairy  Commercial chocolate milk drinks and malted milk. Some non-dairy creamers. Any cheese product containing ingredients to avoid. Beverages  Certain cereal beverages. Beer, ale, malted milk, and some root beers. Some hard ciders. Some instant flavored coffees. Some herbal teas made with barley or with barley malt added. Fats and oils  Some commercial salad dressings. Sour cream containing modified food starch. Sweets and desserts  Some toffees. Chocolate-coated nuts (may be rolled in wheat flour) and some commercial candies and candy bars. Most cakes, cookies, donuts, pastries, and other baked goods. Some commercial ice cream. Ice cream cones. Commercially prepared mixes for cakes, cookies, and other desserts. Bread pudding and other puddings thickened with flour. Products containing brown rice syrup made with barley malt enzyme. Desserts and sweets made with malt flavoring. Seasoning and other foods  Some curry powders, some dry seasoning mixes, some gravy extracts, some meat sauces, some ketchups, some prepared mustards, and horseradish. Certain soy sauces. Malt vinegar. Bouillon and bouillon cubes that contain HVP. Some chip dips, and some chewing gum. Yeast extract. Brewer's yeast. Caramel color. Some medicines and supplements. Some lip glosses and other cosmetics. The items listed may not be a complete list. Talk with your dietitian about what dietary choices are best for you. Summary  Gluten is a protein that is found in wheat, rye, barley, and some other grains. The gluten-free diet includes all foods that do not contain gluten.  If you need help finding gluten-free foods or if you have questions, talk with your diet and nutrition specialist (registered dietitian) or your health care provider.  Read all   food labels. Gluten is often added to foods. Always check the  ingredient list and look for warnings, such as "may contain gluten." This information is not intended to replace advice given to you by your health care provider. Make sure you discuss any questions you have with your health care provider. Document Released: 01/15/2005 Document Revised: 10/31/2015 Document Reviewed: 10/31/2015 Elsevier Interactive Patient Education  2017 Elsevier Inc.  
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
## 2016-05-19 DIAGNOSIS — D509 Iron deficiency anemia, unspecified: Secondary | ICD-10-CM | POA: Diagnosis not present

## 2016-05-19 DIAGNOSIS — N186 End stage renal disease: Secondary | ICD-10-CM | POA: Diagnosis not present

## 2016-05-19 DIAGNOSIS — Z4932 Encounter for adequacy testing for peritoneal dialysis: Secondary | ICD-10-CM | POA: Diagnosis not present

## 2016-05-19 DIAGNOSIS — D630 Anemia in neoplastic disease: Secondary | ICD-10-CM | POA: Diagnosis not present

## 2016-05-19 DIAGNOSIS — K769 Liver disease, unspecified: Secondary | ICD-10-CM | POA: Diagnosis not present

## 2016-05-19 DIAGNOSIS — N2581 Secondary hyperparathyroidism of renal origin: Secondary | ICD-10-CM | POA: Diagnosis not present

## 2016-05-20 DIAGNOSIS — D630 Anemia in neoplastic disease: Secondary | ICD-10-CM | POA: Diagnosis not present

## 2016-05-20 DIAGNOSIS — N186 End stage renal disease: Secondary | ICD-10-CM | POA: Diagnosis not present

## 2016-05-20 DIAGNOSIS — Z4932 Encounter for adequacy testing for peritoneal dialysis: Secondary | ICD-10-CM | POA: Diagnosis not present

## 2016-05-20 DIAGNOSIS — N2581 Secondary hyperparathyroidism of renal origin: Secondary | ICD-10-CM | POA: Diagnosis not present

## 2016-05-20 DIAGNOSIS — K769 Liver disease, unspecified: Secondary | ICD-10-CM | POA: Diagnosis not present

## 2016-05-20 DIAGNOSIS — D509 Iron deficiency anemia, unspecified: Secondary | ICD-10-CM | POA: Diagnosis not present

## 2016-05-21 DIAGNOSIS — D630 Anemia in neoplastic disease: Secondary | ICD-10-CM | POA: Diagnosis not present

## 2016-05-21 DIAGNOSIS — N2581 Secondary hyperparathyroidism of renal origin: Secondary | ICD-10-CM | POA: Diagnosis not present

## 2016-05-21 DIAGNOSIS — K769 Liver disease, unspecified: Secondary | ICD-10-CM | POA: Diagnosis not present

## 2016-05-21 DIAGNOSIS — D509 Iron deficiency anemia, unspecified: Secondary | ICD-10-CM | POA: Diagnosis not present

## 2016-05-21 DIAGNOSIS — N186 End stage renal disease: Secondary | ICD-10-CM | POA: Diagnosis not present

## 2016-05-21 DIAGNOSIS — Z4932 Encounter for adequacy testing for peritoneal dialysis: Secondary | ICD-10-CM | POA: Diagnosis not present

## 2016-05-21 LAB — CMP14+EGFR
A/G RATIO: 1.3 (ref 1.2–2.2)
ALT: 9 IU/L (ref 0–44)
AST: 19 IU/L (ref 0–40)
Albumin: 3.4 g/dL — ABNORMAL LOW (ref 3.5–4.8)
Alkaline Phosphatase: 136 IU/L — ABNORMAL HIGH (ref 39–117)
BUN/Creatinine Ratio: 6 — ABNORMAL LOW (ref 10–24)
BUN: 40 mg/dL — AB (ref 8–27)
Bilirubin Total: 0.3 mg/dL (ref 0.0–1.2)
CHLORIDE: 88 mmol/L — AB (ref 96–106)
CO2: 25 mmol/L (ref 18–29)
Calcium: 9.9 mg/dL (ref 8.6–10.2)
Creatinine, Ser: 7.17 mg/dL (ref 0.76–1.27)
GFR calc Af Amer: 8 mL/min/{1.73_m2} — ABNORMAL LOW (ref >59)
GFR calc non Af Amer: 7 mL/min/{1.73_m2} — ABNORMAL LOW (ref >59)
GLUCOSE: 80 mg/dL (ref 65–99)
Globulin, Total: 2.7 g/dL (ref 1.5–4.5)
POTASSIUM: 3.6 mmol/L (ref 3.5–5.2)
Sodium: 135 mmol/L (ref 134–144)
Total Protein: 6.1 g/dL (ref 6.0–8.5)

## 2016-05-21 LAB — CBC WITH DIFFERENTIAL/PLATELET
BASOS: 1 %
Basophils Absolute: 0 10*3/uL (ref 0.0–0.2)
EOS (ABSOLUTE): 0.2 10*3/uL (ref 0.0–0.4)
Eos: 3 %
Hematocrit: 48.4 % (ref 37.5–51.0)
Hemoglobin: 15.6 g/dL (ref 13.0–17.7)
IMMATURE GRANS (ABS): 0 10*3/uL (ref 0.0–0.1)
Immature Granulocytes: 0 %
LYMPHS: 24 %
Lymphocytes Absolute: 1.7 10*3/uL (ref 0.7–3.1)
MCH: 28.6 pg (ref 26.6–33.0)
MCHC: 32.2 g/dL (ref 31.5–35.7)
MCV: 89 fL (ref 79–97)
MONOCYTES: 8 %
Monocytes Absolute: 0.5 10*3/uL (ref 0.1–0.9)
NEUTROS PCT: 64 %
Neutrophils Absolute: 4.6 10*3/uL (ref 1.4–7.0)
PLATELETS: 200 10*3/uL (ref 150–379)
RBC: 5.46 x10E6/uL (ref 4.14–5.80)
RDW: 18.4 % — AB (ref 12.3–15.4)
WBC: 7 10*3/uL (ref 3.4–10.8)

## 2016-05-21 LAB — TSH+FREE T4
FREE T4: 1.25 ng/dL (ref 0.82–1.77)
TSH: 2.85 u[IU]/mL (ref 0.450–4.500)

## 2016-05-22 DIAGNOSIS — K769 Liver disease, unspecified: Secondary | ICD-10-CM | POA: Diagnosis not present

## 2016-05-22 DIAGNOSIS — D509 Iron deficiency anemia, unspecified: Secondary | ICD-10-CM | POA: Diagnosis not present

## 2016-05-22 DIAGNOSIS — N2581 Secondary hyperparathyroidism of renal origin: Secondary | ICD-10-CM | POA: Diagnosis not present

## 2016-05-22 DIAGNOSIS — N390 Urinary tract infection, site not specified: Secondary | ICD-10-CM | POA: Diagnosis not present

## 2016-05-22 DIAGNOSIS — Z4932 Encounter for adequacy testing for peritoneal dialysis: Secondary | ICD-10-CM | POA: Diagnosis not present

## 2016-05-22 DIAGNOSIS — B9620 Unspecified Escherichia coli [E. coli] as the cause of diseases classified elsewhere: Secondary | ICD-10-CM | POA: Diagnosis not present

## 2016-05-22 DIAGNOSIS — N186 End stage renal disease: Secondary | ICD-10-CM | POA: Diagnosis not present

## 2016-05-22 DIAGNOSIS — D630 Anemia in neoplastic disease: Secondary | ICD-10-CM | POA: Diagnosis not present

## 2016-05-23 ENCOUNTER — Telehealth: Payer: Self-pay | Admitting: Family Medicine

## 2016-05-23 DIAGNOSIS — Z4932 Encounter for adequacy testing for peritoneal dialysis: Secondary | ICD-10-CM | POA: Diagnosis not present

## 2016-05-23 DIAGNOSIS — D630 Anemia in neoplastic disease: Secondary | ICD-10-CM | POA: Diagnosis not present

## 2016-05-23 DIAGNOSIS — N186 End stage renal disease: Secondary | ICD-10-CM | POA: Diagnosis not present

## 2016-05-23 DIAGNOSIS — D509 Iron deficiency anemia, unspecified: Secondary | ICD-10-CM | POA: Diagnosis not present

## 2016-05-23 DIAGNOSIS — B9620 Unspecified Escherichia coli [E. coli] as the cause of diseases classified elsewhere: Secondary | ICD-10-CM | POA: Diagnosis not present

## 2016-05-23 DIAGNOSIS — N2581 Secondary hyperparathyroidism of renal origin: Secondary | ICD-10-CM | POA: Diagnosis not present

## 2016-05-23 DIAGNOSIS — N390 Urinary tract infection, site not specified: Secondary | ICD-10-CM | POA: Diagnosis not present

## 2016-05-23 DIAGNOSIS — K769 Liver disease, unspecified: Secondary | ICD-10-CM | POA: Diagnosis not present

## 2016-05-23 NOTE — Telephone Encounter (Signed)
Please  write and I will sign. Thanks, WS 

## 2016-05-23 NOTE — Telephone Encounter (Signed)
Pt seen on 05/18/16 says he needs letter stating that he is able to drive

## 2016-05-24 ENCOUNTER — Encounter: Payer: Self-pay | Admitting: Family Medicine

## 2016-05-24 DIAGNOSIS — D630 Anemia in neoplastic disease: Secondary | ICD-10-CM | POA: Diagnosis not present

## 2016-05-24 DIAGNOSIS — N390 Urinary tract infection, site not specified: Secondary | ICD-10-CM | POA: Diagnosis not present

## 2016-05-24 DIAGNOSIS — N186 End stage renal disease: Secondary | ICD-10-CM | POA: Diagnosis not present

## 2016-05-24 DIAGNOSIS — N2581 Secondary hyperparathyroidism of renal origin: Secondary | ICD-10-CM | POA: Diagnosis not present

## 2016-05-24 DIAGNOSIS — B9620 Unspecified Escherichia coli [E. coli] as the cause of diseases classified elsewhere: Secondary | ICD-10-CM | POA: Diagnosis not present

## 2016-05-24 DIAGNOSIS — K769 Liver disease, unspecified: Secondary | ICD-10-CM | POA: Diagnosis not present

## 2016-05-24 DIAGNOSIS — D509 Iron deficiency anemia, unspecified: Secondary | ICD-10-CM | POA: Diagnosis not present

## 2016-05-24 DIAGNOSIS — Z4932 Encounter for adequacy testing for peritoneal dialysis: Secondary | ICD-10-CM | POA: Diagnosis not present

## 2016-05-24 NOTE — Telephone Encounter (Signed)
Letter is written and placed on Dr. Livia Snellen desk to sign. Pt is aware Dr. Livia Snellen is out today an will return tomorrow so pt will pick letter up around lunch tomorrow.

## 2016-05-25 DIAGNOSIS — N186 End stage renal disease: Secondary | ICD-10-CM | POA: Diagnosis not present

## 2016-05-25 DIAGNOSIS — D509 Iron deficiency anemia, unspecified: Secondary | ICD-10-CM | POA: Diagnosis not present

## 2016-05-25 DIAGNOSIS — N2581 Secondary hyperparathyroidism of renal origin: Secondary | ICD-10-CM | POA: Diagnosis not present

## 2016-05-25 DIAGNOSIS — D630 Anemia in neoplastic disease: Secondary | ICD-10-CM | POA: Diagnosis not present

## 2016-05-25 DIAGNOSIS — Z4932 Encounter for adequacy testing for peritoneal dialysis: Secondary | ICD-10-CM | POA: Diagnosis not present

## 2016-05-25 DIAGNOSIS — K769 Liver disease, unspecified: Secondary | ICD-10-CM | POA: Diagnosis not present

## 2016-05-26 DIAGNOSIS — Z4932 Encounter for adequacy testing for peritoneal dialysis: Secondary | ICD-10-CM | POA: Diagnosis not present

## 2016-05-26 DIAGNOSIS — K769 Liver disease, unspecified: Secondary | ICD-10-CM | POA: Diagnosis not present

## 2016-05-26 DIAGNOSIS — D630 Anemia in neoplastic disease: Secondary | ICD-10-CM | POA: Diagnosis not present

## 2016-05-26 DIAGNOSIS — D509 Iron deficiency anemia, unspecified: Secondary | ICD-10-CM | POA: Diagnosis not present

## 2016-05-26 DIAGNOSIS — N186 End stage renal disease: Secondary | ICD-10-CM | POA: Diagnosis not present

## 2016-05-26 DIAGNOSIS — N2581 Secondary hyperparathyroidism of renal origin: Secondary | ICD-10-CM | POA: Diagnosis not present

## 2016-05-27 DIAGNOSIS — D630 Anemia in neoplastic disease: Secondary | ICD-10-CM | POA: Diagnosis not present

## 2016-05-27 DIAGNOSIS — N2581 Secondary hyperparathyroidism of renal origin: Secondary | ICD-10-CM | POA: Diagnosis not present

## 2016-05-27 DIAGNOSIS — D509 Iron deficiency anemia, unspecified: Secondary | ICD-10-CM | POA: Diagnosis not present

## 2016-05-27 DIAGNOSIS — K769 Liver disease, unspecified: Secondary | ICD-10-CM | POA: Diagnosis not present

## 2016-05-27 DIAGNOSIS — Z4932 Encounter for adequacy testing for peritoneal dialysis: Secondary | ICD-10-CM | POA: Diagnosis not present

## 2016-05-27 DIAGNOSIS — N186 End stage renal disease: Secondary | ICD-10-CM | POA: Diagnosis not present

## 2016-05-28 DIAGNOSIS — D509 Iron deficiency anemia, unspecified: Secondary | ICD-10-CM | POA: Diagnosis not present

## 2016-05-28 DIAGNOSIS — N186 End stage renal disease: Secondary | ICD-10-CM | POA: Diagnosis not present

## 2016-05-28 DIAGNOSIS — I7789 Other specified disorders of arteries and arterioles: Secondary | ICD-10-CM | POA: Diagnosis not present

## 2016-05-28 DIAGNOSIS — Z4932 Encounter for adequacy testing for peritoneal dialysis: Secondary | ICD-10-CM | POA: Diagnosis not present

## 2016-05-28 DIAGNOSIS — K769 Liver disease, unspecified: Secondary | ICD-10-CM | POA: Diagnosis not present

## 2016-05-28 DIAGNOSIS — Z992 Dependence on renal dialysis: Secondary | ICD-10-CM | POA: Diagnosis not present

## 2016-05-28 DIAGNOSIS — N2581 Secondary hyperparathyroidism of renal origin: Secondary | ICD-10-CM | POA: Diagnosis not present

## 2016-05-28 DIAGNOSIS — D630 Anemia in neoplastic disease: Secondary | ICD-10-CM | POA: Diagnosis not present

## 2016-05-29 DIAGNOSIS — D509 Iron deficiency anemia, unspecified: Secondary | ICD-10-CM | POA: Diagnosis not present

## 2016-05-29 DIAGNOSIS — N2581 Secondary hyperparathyroidism of renal origin: Secondary | ICD-10-CM | POA: Diagnosis not present

## 2016-05-29 DIAGNOSIS — E440 Moderate protein-calorie malnutrition: Secondary | ICD-10-CM | POA: Diagnosis not present

## 2016-05-29 DIAGNOSIS — D631 Anemia in chronic kidney disease: Secondary | ICD-10-CM | POA: Diagnosis not present

## 2016-05-29 DIAGNOSIS — N390 Urinary tract infection, site not specified: Secondary | ICD-10-CM | POA: Diagnosis not present

## 2016-05-29 DIAGNOSIS — K769 Liver disease, unspecified: Secondary | ICD-10-CM | POA: Diagnosis not present

## 2016-05-29 DIAGNOSIS — B9620 Unspecified Escherichia coli [E. coli] as the cause of diseases classified elsewhere: Secondary | ICD-10-CM | POA: Diagnosis not present

## 2016-05-29 DIAGNOSIS — R17 Unspecified jaundice: Secondary | ICD-10-CM | POA: Diagnosis not present

## 2016-05-29 DIAGNOSIS — N186 End stage renal disease: Secondary | ICD-10-CM | POA: Diagnosis not present

## 2016-05-29 DIAGNOSIS — Z4932 Encounter for adequacy testing for peritoneal dialysis: Secondary | ICD-10-CM | POA: Diagnosis not present

## 2016-05-29 DIAGNOSIS — D630 Anemia in neoplastic disease: Secondary | ICD-10-CM | POA: Diagnosis not present

## 2016-05-29 DIAGNOSIS — Z79899 Other long term (current) drug therapy: Secondary | ICD-10-CM | POA: Diagnosis not present

## 2016-05-30 DIAGNOSIS — D630 Anemia in neoplastic disease: Secondary | ICD-10-CM | POA: Diagnosis not present

## 2016-05-30 DIAGNOSIS — K769 Liver disease, unspecified: Secondary | ICD-10-CM | POA: Diagnosis not present

## 2016-05-30 DIAGNOSIS — Z4932 Encounter for adequacy testing for peritoneal dialysis: Secondary | ICD-10-CM | POA: Diagnosis not present

## 2016-05-30 DIAGNOSIS — E440 Moderate protein-calorie malnutrition: Secondary | ICD-10-CM | POA: Diagnosis not present

## 2016-05-30 DIAGNOSIS — N186 End stage renal disease: Secondary | ICD-10-CM | POA: Diagnosis not present

## 2016-05-30 DIAGNOSIS — D631 Anemia in chronic kidney disease: Secondary | ICD-10-CM | POA: Diagnosis not present

## 2016-05-31 DIAGNOSIS — E440 Moderate protein-calorie malnutrition: Secondary | ICD-10-CM | POA: Diagnosis not present

## 2016-05-31 DIAGNOSIS — D630 Anemia in neoplastic disease: Secondary | ICD-10-CM | POA: Diagnosis not present

## 2016-05-31 DIAGNOSIS — Z4932 Encounter for adequacy testing for peritoneal dialysis: Secondary | ICD-10-CM | POA: Diagnosis not present

## 2016-05-31 DIAGNOSIS — R8299 Other abnormal findings in urine: Secondary | ICD-10-CM | POA: Diagnosis not present

## 2016-05-31 DIAGNOSIS — D631 Anemia in chronic kidney disease: Secondary | ICD-10-CM | POA: Diagnosis not present

## 2016-05-31 DIAGNOSIS — K769 Liver disease, unspecified: Secondary | ICD-10-CM | POA: Diagnosis not present

## 2016-05-31 DIAGNOSIS — N186 End stage renal disease: Secondary | ICD-10-CM | POA: Diagnosis not present

## 2016-06-01 DIAGNOSIS — D630 Anemia in neoplastic disease: Secondary | ICD-10-CM | POA: Diagnosis not present

## 2016-06-01 DIAGNOSIS — B9620 Unspecified Escherichia coli [E. coli] as the cause of diseases classified elsewhere: Secondary | ICD-10-CM | POA: Diagnosis not present

## 2016-06-01 DIAGNOSIS — Z4932 Encounter for adequacy testing for peritoneal dialysis: Secondary | ICD-10-CM | POA: Diagnosis not present

## 2016-06-01 DIAGNOSIS — K769 Liver disease, unspecified: Secondary | ICD-10-CM | POA: Diagnosis not present

## 2016-06-01 DIAGNOSIS — D631 Anemia in chronic kidney disease: Secondary | ICD-10-CM | POA: Diagnosis not present

## 2016-06-01 DIAGNOSIS — N390 Urinary tract infection, site not specified: Secondary | ICD-10-CM | POA: Diagnosis not present

## 2016-06-01 DIAGNOSIS — E440 Moderate protein-calorie malnutrition: Secondary | ICD-10-CM | POA: Diagnosis not present

## 2016-06-01 DIAGNOSIS — N186 End stage renal disease: Secondary | ICD-10-CM | POA: Diagnosis not present

## 2016-06-02 DIAGNOSIS — D630 Anemia in neoplastic disease: Secondary | ICD-10-CM | POA: Diagnosis not present

## 2016-06-02 DIAGNOSIS — E440 Moderate protein-calorie malnutrition: Secondary | ICD-10-CM | POA: Diagnosis not present

## 2016-06-02 DIAGNOSIS — Z4932 Encounter for adequacy testing for peritoneal dialysis: Secondary | ICD-10-CM | POA: Diagnosis not present

## 2016-06-02 DIAGNOSIS — D631 Anemia in chronic kidney disease: Secondary | ICD-10-CM | POA: Diagnosis not present

## 2016-06-02 DIAGNOSIS — N186 End stage renal disease: Secondary | ICD-10-CM | POA: Diagnosis not present

## 2016-06-02 DIAGNOSIS — K769 Liver disease, unspecified: Secondary | ICD-10-CM | POA: Diagnosis not present

## 2016-06-03 DIAGNOSIS — K769 Liver disease, unspecified: Secondary | ICD-10-CM | POA: Diagnosis not present

## 2016-06-03 DIAGNOSIS — Z4932 Encounter for adequacy testing for peritoneal dialysis: Secondary | ICD-10-CM | POA: Diagnosis not present

## 2016-06-03 DIAGNOSIS — D631 Anemia in chronic kidney disease: Secondary | ICD-10-CM | POA: Diagnosis not present

## 2016-06-03 DIAGNOSIS — D630 Anemia in neoplastic disease: Secondary | ICD-10-CM | POA: Diagnosis not present

## 2016-06-03 DIAGNOSIS — E440 Moderate protein-calorie malnutrition: Secondary | ICD-10-CM | POA: Diagnosis not present

## 2016-06-03 DIAGNOSIS — N186 End stage renal disease: Secondary | ICD-10-CM | POA: Diagnosis not present

## 2016-06-04 ENCOUNTER — Encounter: Payer: Self-pay | Admitting: Internal Medicine

## 2016-06-04 DIAGNOSIS — E440 Moderate protein-calorie malnutrition: Secondary | ICD-10-CM | POA: Diagnosis not present

## 2016-06-04 DIAGNOSIS — K769 Liver disease, unspecified: Secondary | ICD-10-CM | POA: Diagnosis not present

## 2016-06-04 DIAGNOSIS — D630 Anemia in neoplastic disease: Secondary | ICD-10-CM | POA: Diagnosis not present

## 2016-06-04 DIAGNOSIS — B9620 Unspecified Escherichia coli [E. coli] as the cause of diseases classified elsewhere: Secondary | ICD-10-CM | POA: Diagnosis not present

## 2016-06-04 DIAGNOSIS — D631 Anemia in chronic kidney disease: Secondary | ICD-10-CM | POA: Diagnosis not present

## 2016-06-04 DIAGNOSIS — N390 Urinary tract infection, site not specified: Secondary | ICD-10-CM | POA: Diagnosis not present

## 2016-06-04 DIAGNOSIS — N186 End stage renal disease: Secondary | ICD-10-CM | POA: Diagnosis not present

## 2016-06-04 DIAGNOSIS — Z4932 Encounter for adequacy testing for peritoneal dialysis: Secondary | ICD-10-CM | POA: Diagnosis not present

## 2016-06-05 DIAGNOSIS — N186 End stage renal disease: Secondary | ICD-10-CM | POA: Diagnosis not present

## 2016-06-05 DIAGNOSIS — D631 Anemia in chronic kidney disease: Secondary | ICD-10-CM | POA: Diagnosis not present

## 2016-06-05 DIAGNOSIS — E440 Moderate protein-calorie malnutrition: Secondary | ICD-10-CM | POA: Diagnosis not present

## 2016-06-05 DIAGNOSIS — D630 Anemia in neoplastic disease: Secondary | ICD-10-CM | POA: Diagnosis not present

## 2016-06-05 DIAGNOSIS — Z4932 Encounter for adequacy testing for peritoneal dialysis: Secondary | ICD-10-CM | POA: Diagnosis not present

## 2016-06-05 DIAGNOSIS — K769 Liver disease, unspecified: Secondary | ICD-10-CM | POA: Diagnosis not present

## 2016-06-06 DIAGNOSIS — D630 Anemia in neoplastic disease: Secondary | ICD-10-CM | POA: Diagnosis not present

## 2016-06-06 DIAGNOSIS — N186 End stage renal disease: Secondary | ICD-10-CM | POA: Diagnosis not present

## 2016-06-06 DIAGNOSIS — Z4932 Encounter for adequacy testing for peritoneal dialysis: Secondary | ICD-10-CM | POA: Diagnosis not present

## 2016-06-06 DIAGNOSIS — D631 Anemia in chronic kidney disease: Secondary | ICD-10-CM | POA: Diagnosis not present

## 2016-06-06 DIAGNOSIS — K769 Liver disease, unspecified: Secondary | ICD-10-CM | POA: Diagnosis not present

## 2016-06-06 DIAGNOSIS — E440 Moderate protein-calorie malnutrition: Secondary | ICD-10-CM | POA: Diagnosis not present

## 2016-06-07 DIAGNOSIS — D631 Anemia in chronic kidney disease: Secondary | ICD-10-CM | POA: Diagnosis not present

## 2016-06-07 DIAGNOSIS — D630 Anemia in neoplastic disease: Secondary | ICD-10-CM | POA: Diagnosis not present

## 2016-06-07 DIAGNOSIS — N186 End stage renal disease: Secondary | ICD-10-CM | POA: Diagnosis not present

## 2016-06-07 DIAGNOSIS — K769 Liver disease, unspecified: Secondary | ICD-10-CM | POA: Diagnosis not present

## 2016-06-07 DIAGNOSIS — Z4932 Encounter for adequacy testing for peritoneal dialysis: Secondary | ICD-10-CM | POA: Diagnosis not present

## 2016-06-07 DIAGNOSIS — E440 Moderate protein-calorie malnutrition: Secondary | ICD-10-CM | POA: Diagnosis not present

## 2016-06-08 DIAGNOSIS — K769 Liver disease, unspecified: Secondary | ICD-10-CM | POA: Diagnosis not present

## 2016-06-08 DIAGNOSIS — D631 Anemia in chronic kidney disease: Secondary | ICD-10-CM | POA: Diagnosis not present

## 2016-06-08 DIAGNOSIS — E440 Moderate protein-calorie malnutrition: Secondary | ICD-10-CM | POA: Diagnosis not present

## 2016-06-08 DIAGNOSIS — N186 End stage renal disease: Secondary | ICD-10-CM | POA: Diagnosis not present

## 2016-06-08 DIAGNOSIS — D630 Anemia in neoplastic disease: Secondary | ICD-10-CM | POA: Diagnosis not present

## 2016-06-08 DIAGNOSIS — Z4932 Encounter for adequacy testing for peritoneal dialysis: Secondary | ICD-10-CM | POA: Diagnosis not present

## 2016-06-09 DIAGNOSIS — E440 Moderate protein-calorie malnutrition: Secondary | ICD-10-CM | POA: Diagnosis not present

## 2016-06-09 DIAGNOSIS — D631 Anemia in chronic kidney disease: Secondary | ICD-10-CM | POA: Diagnosis not present

## 2016-06-09 DIAGNOSIS — K769 Liver disease, unspecified: Secondary | ICD-10-CM | POA: Diagnosis not present

## 2016-06-09 DIAGNOSIS — Z4932 Encounter for adequacy testing for peritoneal dialysis: Secondary | ICD-10-CM | POA: Diagnosis not present

## 2016-06-09 DIAGNOSIS — N186 End stage renal disease: Secondary | ICD-10-CM | POA: Diagnosis not present

## 2016-06-09 DIAGNOSIS — D630 Anemia in neoplastic disease: Secondary | ICD-10-CM | POA: Diagnosis not present

## 2016-06-10 DIAGNOSIS — N186 End stage renal disease: Secondary | ICD-10-CM | POA: Diagnosis not present

## 2016-06-10 DIAGNOSIS — D631 Anemia in chronic kidney disease: Secondary | ICD-10-CM | POA: Diagnosis not present

## 2016-06-10 DIAGNOSIS — D630 Anemia in neoplastic disease: Secondary | ICD-10-CM | POA: Diagnosis not present

## 2016-06-10 DIAGNOSIS — K769 Liver disease, unspecified: Secondary | ICD-10-CM | POA: Diagnosis not present

## 2016-06-10 DIAGNOSIS — Z4932 Encounter for adequacy testing for peritoneal dialysis: Secondary | ICD-10-CM | POA: Diagnosis not present

## 2016-06-10 DIAGNOSIS — E440 Moderate protein-calorie malnutrition: Secondary | ICD-10-CM | POA: Diagnosis not present

## 2016-06-11 ENCOUNTER — Ambulatory Visit (INDEPENDENT_AMBULATORY_CARE_PROVIDER_SITE_OTHER): Payer: Medicare Other | Admitting: Gastroenterology

## 2016-06-11 ENCOUNTER — Encounter: Payer: Self-pay | Admitting: Gastroenterology

## 2016-06-11 VITALS — BP 101/68 | HR 87 | Temp 96.5°F | Ht 69.0 in | Wt 137.6 lb

## 2016-06-11 DIAGNOSIS — R197 Diarrhea, unspecified: Secondary | ICD-10-CM | POA: Diagnosis not present

## 2016-06-11 DIAGNOSIS — Z4932 Encounter for adequacy testing for peritoneal dialysis: Secondary | ICD-10-CM | POA: Diagnosis not present

## 2016-06-11 DIAGNOSIS — K769 Liver disease, unspecified: Secondary | ICD-10-CM | POA: Diagnosis not present

## 2016-06-11 DIAGNOSIS — N186 End stage renal disease: Secondary | ICD-10-CM | POA: Diagnosis not present

## 2016-06-11 DIAGNOSIS — D630 Anemia in neoplastic disease: Secondary | ICD-10-CM | POA: Diagnosis not present

## 2016-06-11 DIAGNOSIS — E440 Moderate protein-calorie malnutrition: Secondary | ICD-10-CM | POA: Diagnosis not present

## 2016-06-11 DIAGNOSIS — K900 Celiac disease: Secondary | ICD-10-CM

## 2016-06-11 DIAGNOSIS — D631 Anemia in chronic kidney disease: Secondary | ICD-10-CM | POA: Diagnosis not present
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
## 2016-06-11 NOTE — Patient Instructions (Signed)
1. Please collect stools IF your stools are loose for more than 24 hours.  2. You may use hydrocortisone hemorrhoid cream twice daily to the anorectal region up to 2 weeks at a time if your hemorrhoids bother you.  3. Please have your labs done.  4. Return to the office in 8 weeks.  5. Try to get the name of the provider who did his colonoscopy and upper endoscopy so we can get records.

## 2016-06-12 DIAGNOSIS — D630 Anemia in neoplastic disease: Secondary | ICD-10-CM | POA: Diagnosis not present

## 2016-06-12 DIAGNOSIS — N186 End stage renal disease: Secondary | ICD-10-CM | POA: Diagnosis not present

## 2016-06-12 DIAGNOSIS — D631 Anemia in chronic kidney disease: Secondary | ICD-10-CM | POA: Diagnosis not present

## 2016-06-12 DIAGNOSIS — K769 Liver disease, unspecified: Secondary | ICD-10-CM | POA: Diagnosis not present

## 2016-06-12 DIAGNOSIS — Z4932 Encounter for adequacy testing for peritoneal dialysis: Secondary | ICD-10-CM | POA: Diagnosis not present

## 2016-06-12 DIAGNOSIS — E440 Moderate protein-calorie malnutrition: Secondary | ICD-10-CM | POA: Diagnosis not present

## 2016-06-12 LAB — TISSUE TRANSGLUTAMINASE, IGA: TISSUE TRANSGLUTAMINASE AB, IGA: 2 U/mL (ref <4)

## 2016-06-12 LAB — IGA: IgA: 198 mg/dL (ref 81–463)

## 2016-06-13 ENCOUNTER — Other Ambulatory Visit: Payer: Self-pay | Admitting: Gastroenterology

## 2016-06-13 ENCOUNTER — Other Ambulatory Visit: Payer: Self-pay

## 2016-06-13 DIAGNOSIS — D630 Anemia in neoplastic disease: Secondary | ICD-10-CM | POA: Diagnosis not present

## 2016-06-13 DIAGNOSIS — E440 Moderate protein-calorie malnutrition: Secondary | ICD-10-CM | POA: Diagnosis not present

## 2016-06-13 DIAGNOSIS — R197 Diarrhea, unspecified: Secondary | ICD-10-CM

## 2016-06-13 DIAGNOSIS — Z4932 Encounter for adequacy testing for peritoneal dialysis: Secondary | ICD-10-CM | POA: Diagnosis not present

## 2016-06-13 DIAGNOSIS — K769 Liver disease, unspecified: Secondary | ICD-10-CM | POA: Diagnosis not present

## 2016-06-13 DIAGNOSIS — D631 Anemia in chronic kidney disease: Secondary | ICD-10-CM | POA: Diagnosis not present

## 2016-06-13 DIAGNOSIS — N186 End stage renal disease: Secondary | ICD-10-CM | POA: Diagnosis not present

## 2016-06-13 NOTE — Progress Notes (Signed)
Please let patient's son know that his celiac marker is entirely normal. If he has celiac disease (which I do not have any indication of this time, records pending) His disease is well controlled. Would continue his current diet diet. Await records.  If he has recurrent diarrhea for more than 24 hours I would like for him to collect stool specimen for GI pathogen panel. Almyra Free, the lab canceled my GI pathogen panel. I will like to have it ordered in case the patient needs to collect a stool specimen in the near future.

## 2016-06-13 NOTE — Progress Notes (Signed)
cc'ed to pcp °

## 2016-06-13 NOTE — Assessment & Plan Note (Addendum)
71 year old gentleman with end-stage renal disease on peritoneal dialysis, reported history of celiac disease although historically has not followed a gluten-free diet who presents for 5-6 month history of diarrhea, weight loss in the setting of multiple hospitalizations for UTIs/upper respiratory infections. Patient has had plenty of antibiotics over the course of last several months. He was C. difficile negative 1. Has gone to gluten-free bread, from whole milk to 2% milk and states over the past few weeks he's been back at his baseline of 4-5 formed to soft stool. Intermittent bright red blood per rectum which he feels is a hemorrhoids. Patient may have had antibiotic-induced diarrhea. Cannot exclude diarrhea related to celiac disease. Unclear when his last colonoscopy was. Possibly in 2015. First and foremost we have requested records. We will check celiac serologies at this time. Clinically he is doing much better and has gained weight, BMs back to normal. No upper GI concerns. We will have him come back in 8 weeks. By then we should have records and labs back, see how he is doing. If needed can plan on endoscopic evaluation at that time. Patient and son were told to let us know if he has any concerns in the interim.  We did advise him that if he developed recurrent diarrhea we will perform additional stool studies at that time. Orders placed and containers provided.

## 2016-06-14 DIAGNOSIS — D630 Anemia in neoplastic disease: Secondary | ICD-10-CM | POA: Diagnosis not present

## 2016-06-14 DIAGNOSIS — E440 Moderate protein-calorie malnutrition: Secondary | ICD-10-CM | POA: Diagnosis not present

## 2016-06-14 DIAGNOSIS — N186 End stage renal disease: Secondary | ICD-10-CM | POA: Diagnosis not present

## 2016-06-14 DIAGNOSIS — D631 Anemia in chronic kidney disease: Secondary | ICD-10-CM | POA: Diagnosis not present

## 2016-06-14 DIAGNOSIS — Z4932 Encounter for adequacy testing for peritoneal dialysis: Secondary | ICD-10-CM | POA: Diagnosis not present

## 2016-06-14 DIAGNOSIS — K769 Liver disease, unspecified: Secondary | ICD-10-CM | POA: Diagnosis not present

## 2016-06-15 DIAGNOSIS — E440 Moderate protein-calorie malnutrition: Secondary | ICD-10-CM | POA: Diagnosis not present

## 2016-06-15 DIAGNOSIS — Z4932 Encounter for adequacy testing for peritoneal dialysis: Secondary | ICD-10-CM | POA: Diagnosis not present

## 2016-06-15 DIAGNOSIS — K769 Liver disease, unspecified: Secondary | ICD-10-CM | POA: Diagnosis not present

## 2016-06-15 DIAGNOSIS — D630 Anemia in neoplastic disease: Secondary | ICD-10-CM | POA: Diagnosis not present

## 2016-06-15 DIAGNOSIS — D631 Anemia in chronic kidney disease: Secondary | ICD-10-CM | POA: Diagnosis not present

## 2016-06-15 DIAGNOSIS — N186 End stage renal disease: Secondary | ICD-10-CM | POA: Diagnosis not present

## 2016-06-16 DIAGNOSIS — Z4932 Encounter for adequacy testing for peritoneal dialysis: Secondary | ICD-10-CM | POA: Diagnosis not present

## 2016-06-16 DIAGNOSIS — N186 End stage renal disease: Secondary | ICD-10-CM | POA: Diagnosis not present

## 2016-06-16 DIAGNOSIS — D630 Anemia in neoplastic disease: Secondary | ICD-10-CM | POA: Diagnosis not present

## 2016-06-16 DIAGNOSIS — D631 Anemia in chronic kidney disease: Secondary | ICD-10-CM | POA: Diagnosis not present

## 2016-06-16 DIAGNOSIS — K769 Liver disease, unspecified: Secondary | ICD-10-CM | POA: Diagnosis not present

## 2016-06-16 DIAGNOSIS — E440 Moderate protein-calorie malnutrition: Secondary | ICD-10-CM | POA: Diagnosis not present

## 2016-06-17 DIAGNOSIS — Z4932 Encounter for adequacy testing for peritoneal dialysis: Secondary | ICD-10-CM | POA: Diagnosis not present

## 2016-06-17 DIAGNOSIS — D631 Anemia in chronic kidney disease: Secondary | ICD-10-CM | POA: Diagnosis not present

## 2016-06-17 DIAGNOSIS — K769 Liver disease, unspecified: Secondary | ICD-10-CM | POA: Diagnosis not present

## 2016-06-17 DIAGNOSIS — E440 Moderate protein-calorie malnutrition: Secondary | ICD-10-CM | POA: Diagnosis not present

## 2016-06-17 DIAGNOSIS — N186 End stage renal disease: Secondary | ICD-10-CM | POA: Diagnosis not present

## 2016-06-17 DIAGNOSIS — D630 Anemia in neoplastic disease: Secondary | ICD-10-CM | POA: Diagnosis not present

## 2016-06-18 DIAGNOSIS — E440 Moderate protein-calorie malnutrition: Secondary | ICD-10-CM | POA: Diagnosis not present

## 2016-06-18 DIAGNOSIS — Z4932 Encounter for adequacy testing for peritoneal dialysis: Secondary | ICD-10-CM | POA: Diagnosis not present

## 2016-06-18 DIAGNOSIS — D631 Anemia in chronic kidney disease: Secondary | ICD-10-CM | POA: Diagnosis not present

## 2016-06-18 DIAGNOSIS — D630 Anemia in neoplastic disease: Secondary | ICD-10-CM | POA: Diagnosis not present

## 2016-06-18 DIAGNOSIS — N186 End stage renal disease: Secondary | ICD-10-CM | POA: Diagnosis not present

## 2016-06-18 DIAGNOSIS — K769 Liver disease, unspecified: Secondary | ICD-10-CM | POA: Diagnosis not present

## 2016-06-19 DIAGNOSIS — D631 Anemia in chronic kidney disease: Secondary | ICD-10-CM | POA: Diagnosis not present

## 2016-06-19 DIAGNOSIS — E440 Moderate protein-calorie malnutrition: Secondary | ICD-10-CM | POA: Diagnosis not present

## 2016-06-19 DIAGNOSIS — D630 Anemia in neoplastic disease: Secondary | ICD-10-CM | POA: Diagnosis not present

## 2016-06-19 DIAGNOSIS — N186 End stage renal disease: Secondary | ICD-10-CM | POA: Diagnosis not present

## 2016-06-19 DIAGNOSIS — K769 Liver disease, unspecified: Secondary | ICD-10-CM | POA: Diagnosis not present

## 2016-06-19 DIAGNOSIS — Z4932 Encounter for adequacy testing for peritoneal dialysis: Secondary | ICD-10-CM | POA: Diagnosis not present

## 2016-06-20 DIAGNOSIS — Z4932 Encounter for adequacy testing for peritoneal dialysis: Secondary | ICD-10-CM | POA: Diagnosis not present

## 2016-06-20 DIAGNOSIS — E440 Moderate protein-calorie malnutrition: Secondary | ICD-10-CM | POA: Diagnosis not present

## 2016-06-20 DIAGNOSIS — D631 Anemia in chronic kidney disease: Secondary | ICD-10-CM | POA: Diagnosis not present

## 2016-06-20 DIAGNOSIS — K769 Liver disease, unspecified: Secondary | ICD-10-CM | POA: Diagnosis not present

## 2016-06-20 DIAGNOSIS — N186 End stage renal disease: Secondary | ICD-10-CM | POA: Diagnosis not present

## 2016-06-20 DIAGNOSIS — D630 Anemia in neoplastic disease: Secondary | ICD-10-CM | POA: Diagnosis not present

## 2016-06-21 DIAGNOSIS — D630 Anemia in neoplastic disease: Secondary | ICD-10-CM | POA: Diagnosis not present

## 2016-06-21 DIAGNOSIS — N186 End stage renal disease: Secondary | ICD-10-CM | POA: Diagnosis not present

## 2016-06-21 DIAGNOSIS — D631 Anemia in chronic kidney disease: Secondary | ICD-10-CM | POA: Diagnosis not present

## 2016-06-21 DIAGNOSIS — K769 Liver disease, unspecified: Secondary | ICD-10-CM | POA: Diagnosis not present

## 2016-06-21 DIAGNOSIS — E440 Moderate protein-calorie malnutrition: Secondary | ICD-10-CM | POA: Diagnosis not present

## 2016-06-21 DIAGNOSIS — Z4932 Encounter for adequacy testing for peritoneal dialysis: Secondary | ICD-10-CM | POA: Diagnosis not present

## 2016-06-22 DIAGNOSIS — E440 Moderate protein-calorie malnutrition: Secondary | ICD-10-CM | POA: Diagnosis not present

## 2016-06-22 DIAGNOSIS — D630 Anemia in neoplastic disease: Secondary | ICD-10-CM | POA: Diagnosis not present

## 2016-06-22 DIAGNOSIS — Z4932 Encounter for adequacy testing for peritoneal dialysis: Secondary | ICD-10-CM | POA: Diagnosis not present

## 2016-06-22 DIAGNOSIS — K769 Liver disease, unspecified: Secondary | ICD-10-CM | POA: Diagnosis not present

## 2016-06-22 DIAGNOSIS — D631 Anemia in chronic kidney disease: Secondary | ICD-10-CM | POA: Diagnosis not present

## 2016-06-22 DIAGNOSIS — N186 End stage renal disease: Secondary | ICD-10-CM | POA: Diagnosis not present

## 2016-06-23 DIAGNOSIS — Z4932 Encounter for adequacy testing for peritoneal dialysis: Secondary | ICD-10-CM | POA: Diagnosis not present

## 2016-06-23 DIAGNOSIS — N186 End stage renal disease: Secondary | ICD-10-CM | POA: Diagnosis not present

## 2016-06-23 DIAGNOSIS — K769 Liver disease, unspecified: Secondary | ICD-10-CM | POA: Diagnosis not present

## 2016-06-23 DIAGNOSIS — D631 Anemia in chronic kidney disease: Secondary | ICD-10-CM | POA: Diagnosis not present

## 2016-06-23 DIAGNOSIS — D630 Anemia in neoplastic disease: Secondary | ICD-10-CM | POA: Diagnosis not present

## 2016-06-23 DIAGNOSIS — E440 Moderate protein-calorie malnutrition: Secondary | ICD-10-CM | POA: Diagnosis not present

## 2016-06-24 DIAGNOSIS — D631 Anemia in chronic kidney disease: Secondary | ICD-10-CM | POA: Diagnosis not present

## 2016-06-24 DIAGNOSIS — K769 Liver disease, unspecified: Secondary | ICD-10-CM | POA: Diagnosis not present

## 2016-06-24 DIAGNOSIS — D630 Anemia in neoplastic disease: Secondary | ICD-10-CM | POA: Diagnosis not present

## 2016-06-24 DIAGNOSIS — N186 End stage renal disease: Secondary | ICD-10-CM | POA: Diagnosis not present

## 2016-06-24 DIAGNOSIS — Z4932 Encounter for adequacy testing for peritoneal dialysis: Secondary | ICD-10-CM | POA: Diagnosis not present

## 2016-06-24 DIAGNOSIS — E440 Moderate protein-calorie malnutrition: Secondary | ICD-10-CM | POA: Diagnosis not present

## 2016-06-25 DIAGNOSIS — E440 Moderate protein-calorie malnutrition: Secondary | ICD-10-CM | POA: Diagnosis not present

## 2016-06-25 DIAGNOSIS — K769 Liver disease, unspecified: Secondary | ICD-10-CM | POA: Diagnosis not present

## 2016-06-25 DIAGNOSIS — Z4932 Encounter for adequacy testing for peritoneal dialysis: Secondary | ICD-10-CM | POA: Diagnosis not present

## 2016-06-25 DIAGNOSIS — D631 Anemia in chronic kidney disease: Secondary | ICD-10-CM | POA: Diagnosis not present

## 2016-06-25 DIAGNOSIS — N186 End stage renal disease: Secondary | ICD-10-CM | POA: Diagnosis not present

## 2016-06-25 DIAGNOSIS — D630 Anemia in neoplastic disease: Secondary | ICD-10-CM | POA: Diagnosis not present

## 2016-06-26 DIAGNOSIS — D630 Anemia in neoplastic disease: Secondary | ICD-10-CM | POA: Diagnosis not present

## 2016-06-26 DIAGNOSIS — E440 Moderate protein-calorie malnutrition: Secondary | ICD-10-CM | POA: Diagnosis not present

## 2016-06-26 DIAGNOSIS — N186 End stage renal disease: Secondary | ICD-10-CM | POA: Diagnosis not present

## 2016-06-26 DIAGNOSIS — K769 Liver disease, unspecified: Secondary | ICD-10-CM | POA: Diagnosis not present

## 2016-06-26 DIAGNOSIS — Z4932 Encounter for adequacy testing for peritoneal dialysis: Secondary | ICD-10-CM | POA: Diagnosis not present

## 2016-06-26 DIAGNOSIS — D631 Anemia in chronic kidney disease: Secondary | ICD-10-CM | POA: Diagnosis not present

## 2016-06-27 ENCOUNTER — Ambulatory Visit (INDEPENDENT_AMBULATORY_CARE_PROVIDER_SITE_OTHER): Payer: Medicare Other | Admitting: Family Medicine

## 2016-06-27 ENCOUNTER — Encounter: Payer: Self-pay | Admitting: Family Medicine

## 2016-06-27 VITALS — BP 137/85 | HR 85 | Temp 97.6°F | Ht 69.0 in | Wt 136.0 lb

## 2016-06-27 DIAGNOSIS — E034 Atrophy of thyroid (acquired): Secondary | ICD-10-CM | POA: Diagnosis not present

## 2016-06-27 DIAGNOSIS — Z992 Dependence on renal dialysis: Secondary | ICD-10-CM | POA: Diagnosis not present

## 2016-06-27 DIAGNOSIS — Z4932 Encounter for adequacy testing for peritoneal dialysis: Secondary | ICD-10-CM | POA: Diagnosis not present

## 2016-06-27 DIAGNOSIS — D630 Anemia in neoplastic disease: Secondary | ICD-10-CM | POA: Diagnosis not present

## 2016-06-27 DIAGNOSIS — K769 Liver disease, unspecified: Secondary | ICD-10-CM | POA: Diagnosis not present

## 2016-06-27 DIAGNOSIS — D631 Anemia in chronic kidney disease: Secondary | ICD-10-CM | POA: Diagnosis not present

## 2016-06-27 DIAGNOSIS — N186 End stage renal disease: Secondary | ICD-10-CM | POA: Diagnosis not present

## 2016-06-27 DIAGNOSIS — I10 Essential (primary) hypertension: Secondary | ICD-10-CM | POA: Diagnosis not present

## 2016-06-27 DIAGNOSIS — E440 Moderate protein-calorie malnutrition: Secondary | ICD-10-CM | POA: Diagnosis not present
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
## 2016-06-28 ENCOUNTER — Encounter: Payer: Self-pay | Admitting: Family Medicine

## 2016-06-28 DIAGNOSIS — N3000 Acute cystitis without hematuria: Secondary | ICD-10-CM | POA: Diagnosis not present

## 2016-06-28 DIAGNOSIS — N3020 Other chronic cystitis without hematuria: Secondary | ICD-10-CM | POA: Diagnosis not present

## 2016-06-28 DIAGNOSIS — I7789 Other specified disorders of arteries and arterioles: Secondary | ICD-10-CM | POA: Diagnosis not present

## 2016-06-28 DIAGNOSIS — N186 End stage renal disease: Secondary | ICD-10-CM | POA: Diagnosis not present

## 2016-06-28 DIAGNOSIS — Z4932 Encounter for adequacy testing for peritoneal dialysis: Secondary | ICD-10-CM | POA: Diagnosis not present

## 2016-06-28 DIAGNOSIS — Z992 Dependence on renal dialysis: Secondary | ICD-10-CM | POA: Diagnosis not present

## 2016-06-28 DIAGNOSIS — D630 Anemia in neoplastic disease: Secondary | ICD-10-CM | POA: Diagnosis not present

## 2016-06-28 DIAGNOSIS — D631 Anemia in chronic kidney disease: Secondary | ICD-10-CM | POA: Diagnosis not present

## 2016-06-28 DIAGNOSIS — E440 Moderate protein-calorie malnutrition: Secondary | ICD-10-CM | POA: Diagnosis not present

## 2016-06-28 DIAGNOSIS — K769 Liver disease, unspecified: Secondary | ICD-10-CM | POA: Diagnosis not present

## 2016-06-28 LAB — CBC WITH DIFFERENTIAL/PLATELET
BASOS ABS: 0 10*3/uL (ref 0.0–0.2)
Basos: 1 %
EOS (ABSOLUTE): 0.2 10*3/uL (ref 0.0–0.4)
Eos: 3 %
Hematocrit: 38.7 % (ref 37.5–51.0)
Hemoglobin: 12.7 g/dL — ABNORMAL LOW (ref 13.0–17.7)
Immature Grans (Abs): 0 10*3/uL (ref 0.0–0.1)
Immature Granulocytes: 0 %
LYMPHS ABS: 1.6 10*3/uL (ref 0.7–3.1)
Lymphs: 25 %
MCH: 27.1 pg (ref 26.6–33.0)
MCHC: 32.8 g/dL (ref 31.5–35.7)
MCV: 83 fL (ref 79–97)
Monocytes Absolute: 0.8 10*3/uL (ref 0.1–0.9)
Monocytes: 13 %
NEUTROS ABS: 3.7 10*3/uL (ref 1.4–7.0)
Neutrophils: 58 %
PLATELETS: 182 10*3/uL (ref 150–379)
RBC: 4.69 x10E6/uL (ref 4.14–5.80)
RDW: 19 % — AB (ref 12.3–15.4)
WBC: 6.4 10*3/uL (ref 3.4–10.8)

## 2016-06-28 LAB — CMP14+EGFR
ALK PHOS: 103 IU/L (ref 39–117)
ALT: 12 IU/L (ref 0–44)
AST: 20 IU/L (ref 0–40)
Albumin/Globulin Ratio: 1.5 (ref 1.2–2.2)
Albumin: 3.5 g/dL (ref 3.5–4.8)
BILIRUBIN TOTAL: 0.2 mg/dL (ref 0.0–1.2)
BUN/Creatinine Ratio: 6 — ABNORMAL LOW (ref 10–24)
BUN: 41 mg/dL — AB (ref 8–27)
CHLORIDE: 89 mmol/L — AB (ref 96–106)
CO2: 23 mmol/L (ref 18–29)
Calcium: 8.6 mg/dL (ref 8.6–10.2)
Creatinine, Ser: 6.37 mg/dL (ref 0.76–1.27)
GFR calc Af Amer: 9 mL/min/{1.73_m2} — ABNORMAL LOW (ref >59)
GFR calc non Af Amer: 8 mL/min/{1.73_m2} — ABNORMAL LOW (ref >59)
GLUCOSE: 87 mg/dL (ref 65–99)
Globulin, Total: 2.3 g/dL (ref 1.5–4.5)
Potassium: 4.6 mmol/L (ref 3.5–5.2)
Sodium: 130 mmol/L — ABNORMAL LOW (ref 134–144)
Total Protein: 5.8 g/dL — ABNORMAL LOW (ref 6.0–8.5)

## 2016-06-29 ENCOUNTER — Telehealth: Payer: Self-pay | Admitting: Family Medicine

## 2016-06-29 DIAGNOSIS — D631 Anemia in chronic kidney disease: Secondary | ICD-10-CM | POA: Diagnosis not present

## 2016-06-29 DIAGNOSIS — Z79899 Other long term (current) drug therapy: Secondary | ICD-10-CM | POA: Diagnosis not present

## 2016-06-29 DIAGNOSIS — N2589 Other disorders resulting from impaired renal tubular function: Secondary | ICD-10-CM | POA: Diagnosis not present

## 2016-06-29 DIAGNOSIS — N2581 Secondary hyperparathyroidism of renal origin: Secondary | ICD-10-CM | POA: Diagnosis not present

## 2016-06-29 DIAGNOSIS — D509 Iron deficiency anemia, unspecified: Secondary | ICD-10-CM | POA: Diagnosis not present

## 2016-06-29 DIAGNOSIS — R17 Unspecified jaundice: Secondary | ICD-10-CM | POA: Diagnosis not present

## 2016-06-29 DIAGNOSIS — K769 Liver disease, unspecified: Secondary | ICD-10-CM | POA: Diagnosis not present

## 2016-06-29 DIAGNOSIS — E440 Moderate protein-calorie malnutrition: Secondary | ICD-10-CM | POA: Diagnosis not present

## 2016-06-29 DIAGNOSIS — N186 End stage renal disease: Secondary | ICD-10-CM | POA: Diagnosis not present

## 2016-06-29 DIAGNOSIS — K650 Generalized (acute) peritonitis: Secondary | ICD-10-CM | POA: Diagnosis not present

## 2016-06-29 DIAGNOSIS — D630 Anemia in neoplastic disease: Secondary | ICD-10-CM | POA: Diagnosis not present

## 2016-06-29 DIAGNOSIS — K649 Unspecified hemorrhoids: Secondary | ICD-10-CM

## 2016-06-29 NOTE — Telephone Encounter (Signed)
Pt states he had spoken to you about having his hemorrhoids removed. Did you want to do a referral for him or?

## 2016-06-29 NOTE — Telephone Encounter (Signed)
That would be helpful. Thanks for the update. Best regards, Claretta Fraise

## 2016-06-29 NOTE — Telephone Encounter (Signed)
Yes please

## 2016-06-30 DIAGNOSIS — R17 Unspecified jaundice: Secondary | ICD-10-CM | POA: Diagnosis not present

## 2016-06-30 DIAGNOSIS — K650 Generalized (acute) peritonitis: Secondary | ICD-10-CM | POA: Diagnosis not present

## 2016-06-30 DIAGNOSIS — D630 Anemia in neoplastic disease: Secondary | ICD-10-CM | POA: Diagnosis not present

## 2016-06-30 DIAGNOSIS — K769 Liver disease, unspecified: Secondary | ICD-10-CM | POA: Diagnosis not present

## 2016-06-30 DIAGNOSIS — D631 Anemia in chronic kidney disease: Secondary | ICD-10-CM | POA: Diagnosis not present

## 2016-06-30 DIAGNOSIS — N186 End stage renal disease: Secondary | ICD-10-CM | POA: Diagnosis not present

## 2016-07-01 DIAGNOSIS — K650 Generalized (acute) peritonitis: Secondary | ICD-10-CM | POA: Diagnosis not present

## 2016-07-01 DIAGNOSIS — R17 Unspecified jaundice: Secondary | ICD-10-CM | POA: Diagnosis not present

## 2016-07-01 DIAGNOSIS — N186 End stage renal disease: Secondary | ICD-10-CM | POA: Diagnosis not present

## 2016-07-01 DIAGNOSIS — D630 Anemia in neoplastic disease: Secondary | ICD-10-CM | POA: Diagnosis not present

## 2016-07-01 DIAGNOSIS — K769 Liver disease, unspecified: Secondary | ICD-10-CM | POA: Diagnosis not present

## 2016-07-01 DIAGNOSIS — D631 Anemia in chronic kidney disease: Secondary | ICD-10-CM | POA: Diagnosis not present

## 2016-07-02 DIAGNOSIS — D631 Anemia in chronic kidney disease: Secondary | ICD-10-CM | POA: Diagnosis not present

## 2016-07-02 DIAGNOSIS — K769 Liver disease, unspecified: Secondary | ICD-10-CM | POA: Diagnosis not present

## 2016-07-02 DIAGNOSIS — D630 Anemia in neoplastic disease: Secondary | ICD-10-CM | POA: Diagnosis not present

## 2016-07-02 DIAGNOSIS — K650 Generalized (acute) peritonitis: Secondary | ICD-10-CM | POA: Diagnosis not present

## 2016-07-02 DIAGNOSIS — N186 End stage renal disease: Secondary | ICD-10-CM | POA: Diagnosis not present

## 2016-07-02 DIAGNOSIS — R17 Unspecified jaundice: Secondary | ICD-10-CM | POA: Diagnosis not present

## 2016-07-03 DIAGNOSIS — D630 Anemia in neoplastic disease: Secondary | ICD-10-CM | POA: Diagnosis not present

## 2016-07-03 DIAGNOSIS — K769 Liver disease, unspecified: Secondary | ICD-10-CM | POA: Diagnosis not present

## 2016-07-03 DIAGNOSIS — K650 Generalized (acute) peritonitis: Secondary | ICD-10-CM | POA: Diagnosis not present

## 2016-07-03 DIAGNOSIS — N186 End stage renal disease: Secondary | ICD-10-CM | POA: Diagnosis not present

## 2016-07-03 DIAGNOSIS — R17 Unspecified jaundice: Secondary | ICD-10-CM | POA: Diagnosis not present

## 2016-07-03 DIAGNOSIS — D631 Anemia in chronic kidney disease: Secondary | ICD-10-CM | POA: Diagnosis not present

## 2016-07-04 ENCOUNTER — Telehealth: Payer: Self-pay | Admitting: Family Medicine

## 2016-07-04 ENCOUNTER — Encounter: Payer: Self-pay | Admitting: Family Medicine

## 2016-07-04 DIAGNOSIS — R17 Unspecified jaundice: Secondary | ICD-10-CM | POA: Diagnosis not present

## 2016-07-04 DIAGNOSIS — D631 Anemia in chronic kidney disease: Secondary | ICD-10-CM | POA: Diagnosis not present

## 2016-07-04 DIAGNOSIS — K769 Liver disease, unspecified: Secondary | ICD-10-CM | POA: Diagnosis not present

## 2016-07-04 DIAGNOSIS — N186 End stage renal disease: Secondary | ICD-10-CM | POA: Diagnosis not present

## 2016-07-04 DIAGNOSIS — D630 Anemia in neoplastic disease: Secondary | ICD-10-CM | POA: Diagnosis not present

## 2016-07-04 DIAGNOSIS — K650 Generalized (acute) peritonitis: Secondary | ICD-10-CM | POA: Diagnosis not present

## 2016-07-04 NOTE — Telephone Encounter (Signed)
Informed pt of his appt time with Dr. Arnoldo Morale for 6/21 at 11 am

## 2016-07-05 DIAGNOSIS — K769 Liver disease, unspecified: Secondary | ICD-10-CM | POA: Diagnosis not present

## 2016-07-05 DIAGNOSIS — K650 Generalized (acute) peritonitis: Secondary | ICD-10-CM | POA: Diagnosis not present

## 2016-07-05 DIAGNOSIS — D631 Anemia in chronic kidney disease: Secondary | ICD-10-CM | POA: Diagnosis not present

## 2016-07-05 DIAGNOSIS — N186 End stage renal disease: Secondary | ICD-10-CM | POA: Diagnosis not present

## 2016-07-05 DIAGNOSIS — D630 Anemia in neoplastic disease: Secondary | ICD-10-CM | POA: Diagnosis not present

## 2016-07-05 DIAGNOSIS — R17 Unspecified jaundice: Secondary | ICD-10-CM | POA: Diagnosis not present

## 2016-07-06 DIAGNOSIS — K650 Generalized (acute) peritonitis: Secondary | ICD-10-CM | POA: Diagnosis not present

## 2016-07-06 DIAGNOSIS — K769 Liver disease, unspecified: Secondary | ICD-10-CM | POA: Diagnosis not present

## 2016-07-06 DIAGNOSIS — D631 Anemia in chronic kidney disease: Secondary | ICD-10-CM | POA: Diagnosis not present

## 2016-07-06 DIAGNOSIS — D630 Anemia in neoplastic disease: Secondary | ICD-10-CM | POA: Diagnosis not present

## 2016-07-06 DIAGNOSIS — N186 End stage renal disease: Secondary | ICD-10-CM | POA: Diagnosis not present

## 2016-07-06 DIAGNOSIS — R17 Unspecified jaundice: Secondary | ICD-10-CM | POA: Diagnosis not present

## 2016-07-07 DIAGNOSIS — D631 Anemia in chronic kidney disease: Secondary | ICD-10-CM | POA: Diagnosis not present

## 2016-07-07 DIAGNOSIS — N186 End stage renal disease: Secondary | ICD-10-CM | POA: Diagnosis not present

## 2016-07-07 DIAGNOSIS — K769 Liver disease, unspecified: Secondary | ICD-10-CM | POA: Diagnosis not present

## 2016-07-07 DIAGNOSIS — D630 Anemia in neoplastic disease: Secondary | ICD-10-CM | POA: Diagnosis not present

## 2016-07-07 DIAGNOSIS — R17 Unspecified jaundice: Secondary | ICD-10-CM | POA: Diagnosis not present

## 2016-07-07 DIAGNOSIS — K650 Generalized (acute) peritonitis: Secondary | ICD-10-CM | POA: Diagnosis not present

## 2016-07-08 DIAGNOSIS — N186 End stage renal disease: Secondary | ICD-10-CM | POA: Diagnosis not present

## 2016-07-08 DIAGNOSIS — K650 Generalized (acute) peritonitis: Secondary | ICD-10-CM | POA: Diagnosis not present

## 2016-07-08 DIAGNOSIS — D630 Anemia in neoplastic disease: Secondary | ICD-10-CM | POA: Diagnosis not present

## 2016-07-08 DIAGNOSIS — K769 Liver disease, unspecified: Secondary | ICD-10-CM | POA: Diagnosis not present

## 2016-07-08 DIAGNOSIS — R17 Unspecified jaundice: Secondary | ICD-10-CM | POA: Diagnosis not present

## 2016-07-08 DIAGNOSIS — D631 Anemia in chronic kidney disease: Secondary | ICD-10-CM | POA: Diagnosis not present

## 2016-07-09 DIAGNOSIS — D630 Anemia in neoplastic disease: Secondary | ICD-10-CM | POA: Diagnosis not present

## 2016-07-09 DIAGNOSIS — R8299 Other abnormal findings in urine: Secondary | ICD-10-CM | POA: Diagnosis not present

## 2016-07-09 DIAGNOSIS — R17 Unspecified jaundice: Secondary | ICD-10-CM | POA: Diagnosis not present

## 2016-07-09 DIAGNOSIS — D631 Anemia in chronic kidney disease: Secondary | ICD-10-CM | POA: Diagnosis not present

## 2016-07-09 DIAGNOSIS — K650 Generalized (acute) peritonitis: Secondary | ICD-10-CM | POA: Diagnosis not present

## 2016-07-09 DIAGNOSIS — N186 End stage renal disease: Secondary | ICD-10-CM | POA: Diagnosis not present

## 2016-07-09 DIAGNOSIS — K769 Liver disease, unspecified: Secondary | ICD-10-CM | POA: Diagnosis not present

## 2016-07-10 DIAGNOSIS — D631 Anemia in chronic kidney disease: Secondary | ICD-10-CM | POA: Diagnosis not present

## 2016-07-10 DIAGNOSIS — N186 End stage renal disease: Secondary | ICD-10-CM | POA: Diagnosis not present

## 2016-07-10 DIAGNOSIS — D630 Anemia in neoplastic disease: Secondary | ICD-10-CM | POA: Diagnosis not present

## 2016-07-10 DIAGNOSIS — K769 Liver disease, unspecified: Secondary | ICD-10-CM | POA: Diagnosis not present

## 2016-07-10 DIAGNOSIS — R17 Unspecified jaundice: Secondary | ICD-10-CM | POA: Diagnosis not present

## 2016-07-10 DIAGNOSIS — K650 Generalized (acute) peritonitis: Secondary | ICD-10-CM | POA: Diagnosis not present

## 2016-07-11 DIAGNOSIS — D631 Anemia in chronic kidney disease: Secondary | ICD-10-CM | POA: Diagnosis not present

## 2016-07-11 DIAGNOSIS — K769 Liver disease, unspecified: Secondary | ICD-10-CM | POA: Diagnosis not present

## 2016-07-11 DIAGNOSIS — N186 End stage renal disease: Secondary | ICD-10-CM | POA: Diagnosis not present

## 2016-07-11 DIAGNOSIS — K650 Generalized (acute) peritonitis: Secondary | ICD-10-CM | POA: Diagnosis not present

## 2016-07-11 DIAGNOSIS — D630 Anemia in neoplastic disease: Secondary | ICD-10-CM | POA: Diagnosis not present

## 2016-07-11 DIAGNOSIS — R17 Unspecified jaundice: Secondary | ICD-10-CM | POA: Diagnosis not present

## 2016-07-12 DIAGNOSIS — K769 Liver disease, unspecified: Secondary | ICD-10-CM | POA: Diagnosis not present

## 2016-07-12 DIAGNOSIS — N186 End stage renal disease: Secondary | ICD-10-CM | POA: Diagnosis not present

## 2016-07-12 DIAGNOSIS — D630 Anemia in neoplastic disease: Secondary | ICD-10-CM | POA: Diagnosis not present

## 2016-07-12 DIAGNOSIS — R17 Unspecified jaundice: Secondary | ICD-10-CM | POA: Diagnosis not present

## 2016-07-12 DIAGNOSIS — K650 Generalized (acute) peritonitis: Secondary | ICD-10-CM | POA: Diagnosis not present

## 2016-07-12 DIAGNOSIS — D631 Anemia in chronic kidney disease: Secondary | ICD-10-CM | POA: Diagnosis not present

## 2016-07-13 ENCOUNTER — Telehealth: Payer: Self-pay

## 2016-07-13 ENCOUNTER — Other Ambulatory Visit: Payer: Self-pay | Admitting: Family Medicine

## 2016-07-13 DIAGNOSIS — R17 Unspecified jaundice: Secondary | ICD-10-CM | POA: Diagnosis not present

## 2016-07-13 DIAGNOSIS — D630 Anemia in neoplastic disease: Secondary | ICD-10-CM | POA: Diagnosis not present

## 2016-07-13 DIAGNOSIS — K650 Generalized (acute) peritonitis: Secondary | ICD-10-CM | POA: Diagnosis not present

## 2016-07-13 DIAGNOSIS — D631 Anemia in chronic kidney disease: Secondary | ICD-10-CM | POA: Diagnosis not present

## 2016-07-13 DIAGNOSIS — H9190 Unspecified hearing loss, unspecified ear: Secondary | ICD-10-CM

## 2016-07-13 DIAGNOSIS — N186 End stage renal disease: Secondary | ICD-10-CM | POA: Diagnosis not present

## 2016-07-13 DIAGNOSIS — K769 Liver disease, unspecified: Secondary | ICD-10-CM | POA: Diagnosis not present

## 2016-07-13 NOTE — Telephone Encounter (Signed)
Referral ordered

## 2016-07-13 NOTE — Telephone Encounter (Signed)
Patient aware of that referral has been placed.  Patient would like to see Dr. Orpah Melter

## 2016-07-14 DIAGNOSIS — K650 Generalized (acute) peritonitis: Secondary | ICD-10-CM | POA: Diagnosis not present

## 2016-07-14 DIAGNOSIS — K769 Liver disease, unspecified: Secondary | ICD-10-CM | POA: Diagnosis not present

## 2016-07-14 DIAGNOSIS — R17 Unspecified jaundice: Secondary | ICD-10-CM | POA: Diagnosis not present

## 2016-07-14 DIAGNOSIS — D631 Anemia in chronic kidney disease: Secondary | ICD-10-CM | POA: Diagnosis not present

## 2016-07-14 DIAGNOSIS — D630 Anemia in neoplastic disease: Secondary | ICD-10-CM | POA: Diagnosis not present

## 2016-07-14 DIAGNOSIS — N186 End stage renal disease: Secondary | ICD-10-CM | POA: Diagnosis not present

## 2016-07-15 ENCOUNTER — Emergency Department (HOSPITAL_COMMUNITY): Payer: Medicare Other

## 2016-07-15 ENCOUNTER — Inpatient Hospital Stay (HOSPITAL_COMMUNITY)
Admission: EM | Admit: 2016-07-15 | Discharge: 2016-07-17 | DRG: 371 | Disposition: A | Discharge status: Home / Self Care | Payer: Medicare Other | Attending: Internal Medicine | Admitting: Internal Medicine

## 2016-07-15 ENCOUNTER — Encounter (HOSPITAL_COMMUNITY): Payer: Self-pay

## 2016-07-15 DIAGNOSIS — E039 Hypothyroidism, unspecified: Secondary | ICD-10-CM | POA: Diagnosis present

## 2016-07-15 DIAGNOSIS — Z66 Do not resuscitate: Secondary | ICD-10-CM | POA: Diagnosis present

## 2016-07-15 DIAGNOSIS — F319 Bipolar disorder, unspecified: Secondary | ICD-10-CM | POA: Diagnosis present

## 2016-07-15 DIAGNOSIS — I120 Hypertensive chronic kidney disease with stage 5 chronic kidney disease or end stage renal disease: Secondary | ICD-10-CM | POA: Diagnosis present

## 2016-07-15 DIAGNOSIS — K900 Celiac disease: Secondary | ICD-10-CM | POA: Diagnosis present

## 2016-07-15 DIAGNOSIS — I10 Essential (primary) hypertension: Secondary | ICD-10-CM | POA: Diagnosis present

## 2016-07-15 DIAGNOSIS — J13 Pneumonia due to Streptococcus pneumoniae: Secondary | ICD-10-CM | POA: Diagnosis not present

## 2016-07-15 DIAGNOSIS — E876 Hypokalemia: Secondary | ICD-10-CM | POA: Diagnosis not present

## 2016-07-15 DIAGNOSIS — Z992 Dependence on renal dialysis: Secondary | ICD-10-CM | POA: Diagnosis not present

## 2016-07-15 DIAGNOSIS — N186 End stage renal disease: Secondary | ICD-10-CM | POA: Diagnosis not present

## 2016-07-15 DIAGNOSIS — T82868S Thrombosis of vascular prosthetic devices, implants and grafts, sequela: Secondary | ICD-10-CM | POA: Diagnosis not present

## 2016-07-15 DIAGNOSIS — R1084 Generalized abdominal pain: Secondary | ICD-10-CM

## 2016-07-15 DIAGNOSIS — D631 Anemia in chronic kidney disease: Secondary | ICD-10-CM | POA: Diagnosis present

## 2016-07-15 DIAGNOSIS — F17220 Nicotine dependence, chewing tobacco, uncomplicated: Secondary | ICD-10-CM | POA: Diagnosis present

## 2016-07-15 DIAGNOSIS — Z801 Family history of malignant neoplasm of trachea, bronchus and lung: Secondary | ICD-10-CM

## 2016-07-15 DIAGNOSIS — Z8249 Family history of ischemic heart disease and other diseases of the circulatory system: Secondary | ICD-10-CM

## 2016-07-15 DIAGNOSIS — N2581 Secondary hyperparathyroidism of renal origin: Secondary | ICD-10-CM | POA: Diagnosis present

## 2016-07-15 DIAGNOSIS — K658 Other peritonitis: Secondary | ICD-10-CM | POA: Diagnosis not present

## 2016-07-15 DIAGNOSIS — K769 Liver disease, unspecified: Secondary | ICD-10-CM | POA: Diagnosis not present

## 2016-07-15 DIAGNOSIS — D630 Anemia in neoplastic disease: Secondary | ICD-10-CM | POA: Diagnosis not present

## 2016-07-15 DIAGNOSIS — R17 Unspecified jaundice: Secondary | ICD-10-CM | POA: Diagnosis not present

## 2016-07-15 DIAGNOSIS — Z79899 Other long term (current) drug therapy: Secondary | ICD-10-CM

## 2016-07-15 DIAGNOSIS — F0390 Unspecified dementia without behavioral disturbance: Secondary | ICD-10-CM | POA: Diagnosis present

## 2016-07-15 DIAGNOSIS — R509 Fever, unspecified: Secondary | ICD-10-CM | POA: Diagnosis not present

## 2016-07-15 DIAGNOSIS — K650 Generalized (acute) peritonitis: Secondary | ICD-10-CM | POA: Diagnosis not present

## 2016-07-15 DIAGNOSIS — K659 Peritonitis, unspecified: Secondary | ICD-10-CM | POA: Diagnosis not present

## 2016-07-15 DIAGNOSIS — R109 Unspecified abdominal pain: Secondary | ICD-10-CM | POA: Diagnosis present

## 2016-07-15 LAB — GRAM STAIN

## 2016-07-15 LAB — CBC WITH DIFFERENTIAL/PLATELET
BASOS ABS: 0 10*3/uL (ref 0.0–0.1)
Basophils Relative: 0 %
Eosinophils Absolute: 0.1 10*3/uL (ref 0.0–0.7)
Eosinophils Relative: 1 %
HEMATOCRIT: 39.4 % (ref 39.0–52.0)
HEMOGLOBIN: 13 g/dL (ref 13.0–17.0)
LYMPHS PCT: 5 %
Lymphs Abs: 0.5 10*3/uL — ABNORMAL LOW (ref 0.7–4.0)
MCH: 28 pg (ref 26.0–34.0)
MCHC: 33 g/dL (ref 30.0–36.0)
MCV: 84.9 fL (ref 78.0–100.0)
MONO ABS: 0.7 10*3/uL (ref 0.1–1.0)
Monocytes Relative: 8 %
NEUTROS ABS: 7.8 10*3/uL — AB (ref 1.7–7.7)
NEUTROS PCT: 86 %
Platelets: 184 10*3/uL (ref 150–400)
RBC: 4.64 MIL/uL (ref 4.22–5.81)
RDW: 19.7 % — ABNORMAL HIGH (ref 11.5–15.5)
WBC: 9 10*3/uL (ref 4.0–10.5)

## 2016-07-15 LAB — I-STAT CG4 LACTIC ACID, ED
LACTIC ACID, VENOUS: 2.04 mmol/L — AB (ref 0.5–1.9)
LACTIC ACID, VENOUS: 1.92 mmol/L — AB (ref 0.5–1.9)

## 2016-07-15 LAB — COMPREHENSIVE METABOLIC PANEL
ALBUMIN: 2.9 g/dL — AB (ref 3.5–5.0)
ALT: 14 U/L — ABNORMAL LOW (ref 17–63)
ANION GAP: 13 (ref 5–15)
AST: 22 U/L (ref 15–41)
Alkaline Phosphatase: 90 U/L (ref 38–126)
BILIRUBIN TOTAL: 0.6 mg/dL (ref 0.3–1.2)
BUN: 40 mg/dL — ABNORMAL HIGH (ref 6–20)
CO2: 26 mmol/L (ref 22–32)
Calcium: 8.9 mg/dL (ref 8.9–10.3)
Chloride: 91 mmol/L — ABNORMAL LOW (ref 101–111)
Creatinine, Ser: 6.67 mg/dL — ABNORMAL HIGH (ref 0.61–1.24)
GFR calc Af Amer: 9 mL/min — ABNORMAL LOW (ref >60)
GFR calc non Af Amer: 7 mL/min — ABNORMAL LOW (ref >60)
GLUCOSE: 83 mg/dL (ref 65–99)
POTASSIUM: 3.5 mmol/L (ref 3.5–5.1)
Sodium: 130 mmol/L — ABNORMAL LOW (ref 135–145)
TOTAL PROTEIN: 6.6 g/dL (ref 6.5–8.1)

## 2016-07-15 LAB — BODY FLUID CELL COUNT WITH DIFFERENTIAL
EOS FL: 1 %
LYMPHS FL: 0 %
Monocyte-Macrophage-Serous Fluid: 5 % — ABNORMAL LOW (ref 50–90)
NEUTROPHIL FLUID: 94 % — AB (ref 0–25)
Total Nucleated Cell Count, Fluid: 12625 cu mm — ABNORMAL HIGH (ref 0–1000)

## 2016-07-15 MED ORDER — PIPERACILLIN-TAZOBACTAM IN DEX 2-0.25 GM/50ML IV SOLN
2.2500 g | Freq: Three times a day (TID) | INTRAVENOUS | Status: DC
  Filled 2016-07-15 (×2): qty 50

## 2016-07-15 MED ORDER — SODIUM CHLORIDE 0.9 % IV BOLUS (SEPSIS)
500.0000 mL | Freq: Once | INTRAVENOUS | Status: AC
  Administered 2016-07-15: 500 mL via INTRAVENOUS

## 2016-07-15 MED ORDER — SODIUM CHLORIDE 0.9% FLUSH
3.0000 mL | Freq: Two times a day (BID) | INTRAVENOUS | Status: DC
  Administered 2016-07-16 – 2016-07-17 (×3): 3 mL via INTRAVENOUS

## 2016-07-15 MED ORDER — HEPARIN 1000 UNIT/ML FOR PERITONEAL DIALYSIS: 500.0000 [IU] | INTRAMUSCULAR | Status: DC | PRN

## 2016-07-15 MED ORDER — LOSARTAN POTASSIUM 50 MG PO TABS
50.0000 mg | ORAL_TABLET | Freq: Every day | ORAL | Status: DC
  Administered 2016-07-15: 50 mg via ORAL
  Filled 2016-07-15: qty 1

## 2016-07-15 MED ORDER — VITAMIN D 1000 UNITS PO TABS
2000.0000 [IU] | ORAL_TABLET | Freq: Every day | ORAL | Status: DC
  Administered 2016-07-15 – 2016-07-17 (×3): 2000 [IU] via ORAL
  Filled 2016-07-15 (×3): qty 2

## 2016-07-15 MED ORDER — DIVALPROEX SODIUM 250 MG PO DR TAB: 250.0000 mg | DELAYED_RELEASE_TABLET | ORAL | Status: DC

## 2016-07-15 MED ORDER — NEPRO/CARBSTEADY PO LIQD
237.0000 mL | Freq: Two times a day (BID) | ORAL | Status: DC
  Administered 2016-07-16 – 2016-07-17 (×4): 237 mL via ORAL

## 2016-07-15 MED ORDER — HEPARIN 1000 UNIT/ML FOR PERITONEAL DIALYSIS
INTRAPERITONEAL | Status: DC | PRN
  Filled 2016-07-15: qty 3000

## 2016-07-15 MED ORDER — DELFLEX-LC/1.5% DEXTROSE 344 MOSM/L IP SOLN: INTRAPERITONEAL | Status: DC

## 2016-07-15 MED ORDER — DIVALPROEX SODIUM 500 MG PO DR TAB
500.0000 mg | DELAYED_RELEASE_TABLET | Freq: Every evening | ORAL | Status: DC
  Administered 2016-07-15 – 2016-07-16 (×2): 500 mg via ORAL
  Filled 2016-07-15 (×2): qty 1

## 2016-07-15 MED ORDER — PIPERACILLIN-TAZOBACTAM IN DEX 2-0.25 GM/50ML IV SOLN
2.2500 g | Freq: Once | INTRAVENOUS | Status: AC
  Administered 2016-07-15: 2.25 g via INTRAVENOUS
  Filled 2016-07-15: qty 50

## 2016-07-15 MED ORDER — HALOPERIDOL 2 MG PO TABS
2.0000 mg | ORAL_TABLET | Freq: Every day | ORAL | Status: DC
  Administered 2016-07-15 – 2016-07-16 (×2): 2 mg via ORAL
  Filled 2016-07-15 (×3): qty 1

## 2016-07-15 MED ORDER — GENTAMICIN SULFATE 0.1 % EX CREA
1.0000 "application " | TOPICAL_CREAM | Freq: Every day | CUTANEOUS | Status: DC
  Administered 2016-07-16: 1 via TOPICAL
  Filled 2016-07-15: qty 15

## 2016-07-15 MED ORDER — DEXTROSE 5 % IV SOLN
1.0000 g | Freq: Once | INTRAVENOUS | Status: AC
  Administered 2016-07-15: 1 g via INTRAVENOUS
  Filled 2016-07-15: qty 1

## 2016-07-15 MED ORDER — DEXTROSE 5 % IV SOLN
500.0000 mg | INTRAVENOUS | Status: DC
  Administered 2016-07-16: 500 mg via INTRAVENOUS
  Filled 2016-07-15: qty 0.5

## 2016-07-15 MED ORDER — LEVOTHYROXINE SODIUM 50 MCG PO TABS
50.0000 ug | ORAL_TABLET | Freq: Every day | ORAL | Status: DC
  Administered 2016-07-15 – 2016-07-17 (×3): 50 ug via ORAL
  Filled 2016-07-15 (×3): qty 1

## 2016-07-15 MED ORDER — VANCOMYCIN HCL 10 G IV SOLR
1750.0000 mg | Freq: Once | INTRAVENOUS | Status: AC
  Administered 2016-07-15: 1750 mg via INTRAVENOUS
  Filled 2016-07-15 (×2): qty 1750

## 2016-07-15 MED ORDER — DIVALPROEX SODIUM 250 MG PO DR TAB
250.0000 mg | DELAYED_RELEASE_TABLET | ORAL | Status: DC
  Filled 2016-07-15 (×2): qty 1

## 2016-07-15 NOTE — ED Notes (Signed)
Pt presents with possible peritonitis from his dialysis port. Family states that they went over to his residence on Friday and found his port open. Pt was prescribed antibiotics and he has had diarrhea ever since. Family advised the fluid was cloudy with white clumps in it. Family also advised the pt gets 2059ml and only have 1649mls of return. Family also reported a low grade fever of 98.5 with his normal of 97.2.

## 2016-07-15 NOTE — Progress Notes (Signed)
New Admission Note:   Arrival Method:  stretcher from ED Mental Orientation: alert and orientated x 4  Telemetry: box 1 Assessment: Completed Skin: see flowsheet  IV: left FA  Pain:denies  Tubes: PD tube RLQ Safety Measures: Safety Fall Prevention Plan has been given, discussed and signed Admission: Completed [facility] Orientation: Patient has been orientated to the room, unit and staff.  Family: none   Orders have been reviewed and implemented. Will continue to monitor the patient. Call light has been placed within reach and bed alarm has been activated.   Emilio Math, RN [facility]  Phone number: [phone]

## 2016-07-15 NOTE — Progress Notes (Signed)
Pharmacy Antibiotic Note  Darren Dunn is a 71 y.o. male admitted on 07/15/2016 with abdominal pain and wound infection.  Patient reports cloudy fluid drainage from peritoneal dialysis and was recently on Keflex.  Pharmacy has been consulted for Zosyn dosing for intra-abdominal infection/peritonitis.  He is afebrile and his WBC is WNL.  Plan: - Zosyn 2.25gm IV Q8H - Monitor PD schedule/tolerance, micro data, resolution of infection   Weight: 136 lb (61.7 kg)  Temp (24hrs), Avg:99.4 F (37.4 C), Min:99.4 F (37.4 C), Max:99.4 F (37.4 C)   Recent Labs Lab 07/15/16 0241 07/15/16 0254 07/15/16 0540  WBC 9.0  --   --   CREATININE 6.67*  --   --   LATICACIDVEN  --  2.04* 1.92*    Estimated Creatinine Clearance: 9 mL/min (A) (by C-G formula based on SCr of 6.67 mg/dL (H)).    Allergies  Allergen Reactions  . Sulfa Antibiotics Other (See Comments)    Bruises.     Zosyn 6/17 >>  6/17 BCx - 6/17 body fluid cx -   Amerigo Mcglory D. Mina Marble, PharmD, BCPS Pager:  [phone] 07/15/2016, 8:47 AM

## 2016-07-15 NOTE — ED Notes (Signed)
Pt resting with eyes closed. No complaints voiced

## 2016-07-15 NOTE — ED Triage Notes (Signed)
Pt states is peritoneal dialysis. Pt states recently placed on abx for possible peritonitis. Pt states noticed cloudy fluid draining from peritoneal dialysis this evening. Pt complaining of some abdominal pain. Pt also states new onset diarrhea today.
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
## 2016-07-15 NOTE — ED Notes (Signed)
Patient states he often goes multiple days without urinating.

## 2016-07-15 NOTE — ED Notes (Signed)
Spoke with Joelene Millin, Agricultural consultant for 6E in regards to the cultures and gram stain who advised it was going to be about an hour or more before she can come down to draw those labs.
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
## 2016-07-15 NOTE — Progress Notes (Signed)
Called and made Day ED RN aware of delay in obtaining the peritoneal dialysis sample.  Waiting on On-Call HD/PD nurse to come in.  Will update ED RN once we know when HD/PD nurse is here.  Earleen Reaper RN-BC, [employer]

## 2016-07-15 NOTE — ED Notes (Signed)
Sprite and crackers given per RN

## 2016-07-15 NOTE — Progress Notes (Signed)
Pharmacy Antibiotic Note  Darren Dunn is a 71 y.o. male admitted on 07/15/2016 with peritonitis.  Pharmacy has been consulted for vancomycin and Fortaz dosing.  Patient is on PD (1.5% bags- 4 exchanges in 24h with 3L dwell for 1.5h, no day bag) and per report from PD RN on call, patient's son told her that patient walked around all day Friday uncapped- on Saturday he came to clinic and he was capped and started on Keflex 500mg  PO BID.Marland Kitchen Received Zosyn 2.25g IV x1 earlier today.  Cell count with 12K. Gm stain negative.  Plan: Vancomycin 1750mg  IV x1 (~30mg /kg)- check level in 3-5 days to assess redose Fortaz 1g IV x1, then 500mg  IV q24h Follow PD tolerance and exchange schedule, clinical progression, cell counts  Weight: 136 lb (61.7 kg)  Temp (24hrs), Avg:99 F (37.2 C), Min:98.6 F (37 C), Max:99.4 F (37.4 C)   Recent Labs Lab 07/15/16 0241 07/15/16 0254 07/15/16 0540  WBC 9.0  --   --   CREATININE 6.67*  --   --   LATICACIDVEN  --  2.04* 1.92*    Estimated Creatinine Clearance: 9 mL/min (A) (by C-G formula based on SCr of 6.67 mg/dL (H)).    Allergies  Allergen Reactions  . Sulfa Antibiotics Other (See Comments)    Bruises.    Antimicrobials this admission: Zosyn 6/17 x1 Vancomycin 6/17>> [name] 6/17>>  Dose adjustments this admission: n/a  Microbiology results: 6/17 BCx:  6/17 peritoneal fluid:   Thank you for allowing pharmacy to be a part of this patient's care.  Daimon Kean D. Kambree Krauss, PharmD, BCPS Clinical Pharmacist 07/15/2016 5:33 PM
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
## 2016-07-16 DIAGNOSIS — D631 Anemia in chronic kidney disease: Secondary | ICD-10-CM | POA: Diagnosis not present

## 2016-07-16 DIAGNOSIS — D630 Anemia in neoplastic disease: Secondary | ICD-10-CM | POA: Diagnosis not present

## 2016-07-16 DIAGNOSIS — R17 Unspecified jaundice: Secondary | ICD-10-CM | POA: Diagnosis not present

## 2016-07-16 DIAGNOSIS — K659 Peritonitis, unspecified: Principal | ICD-10-CM

## 2016-07-16 DIAGNOSIS — N186 End stage renal disease: Secondary | ICD-10-CM | POA: Diagnosis not present

## 2016-07-16 DIAGNOSIS — K650 Generalized (acute) peritonitis: Secondary | ICD-10-CM | POA: Diagnosis not present

## 2016-07-16 DIAGNOSIS — K769 Liver disease, unspecified: Secondary | ICD-10-CM | POA: Diagnosis not present

## 2016-07-16 DIAGNOSIS — T82868S Thrombosis of vascular prosthetic devices, implants and grafts, sequela: Secondary | ICD-10-CM

## 2016-07-16 DIAGNOSIS — Z992 Dependence on renal dialysis: Secondary | ICD-10-CM

## 2016-07-16 LAB — BASIC METABOLIC PANEL
Anion gap: 12 (ref 5–15)
BUN: 45 mg/dL — AB (ref 6–20)
CO2: 22 mmol/L (ref 22–32)
CREATININE: 6.43 mg/dL — AB (ref 0.61–1.24)
Calcium: 8.2 mg/dL — ABNORMAL LOW (ref 8.9–10.3)
Chloride: 93 mmol/L — ABNORMAL LOW (ref 101–111)
GFR, EST AFRICAN AMERICAN: 9 mL/min — AB (ref >60)
GFR, EST NON AFRICAN AMERICAN: 8 mL/min — AB (ref >60)
Glucose, Bld: 112 mg/dL — ABNORMAL HIGH (ref 65–99)
Potassium: 3.1 mmol/L — ABNORMAL LOW (ref 3.5–5.1)
SODIUM: 127 mmol/L — AB (ref 135–145)

## 2016-07-16 LAB — CBC
HCT: 30.7 % — ABNORMAL LOW (ref 39.0–52.0)
Hemoglobin: 10 g/dL — ABNORMAL LOW (ref 13.0–17.0)
MCH: 27.3 pg (ref 26.0–34.0)
MCHC: 32.6 g/dL (ref 30.0–36.0)
MCV: 83.9 fL (ref 78.0–100.0)
PLATELETS: 162 10*3/uL (ref 150–400)
RBC: 3.66 MIL/uL — AB (ref 4.22–5.81)
RDW: 19.9 % — AB (ref 11.5–15.5)
WBC: 6.4 10*3/uL (ref 4.0–10.5)

## 2016-07-16 MED ORDER — LOSARTAN POTASSIUM 25 MG PO TABS
25.0000 mg | ORAL_TABLET | Freq: Every day | ORAL | Status: DC
Start: 2016-07-16 — End: 2016-07-17
  Administered 2016-07-16: 25 mg via ORAL
  Filled 2016-07-16: qty 1

## 2016-07-16 MED ORDER — HEPARIN SODIUM (PORCINE) 5000 UNIT/ML IJ SOLN
5000.0000 [IU] | Freq: Three times a day (TID) | INTRAMUSCULAR | Status: DC
  Administered 2016-07-16 – 2016-07-17 (×3): 5000 [IU] via SUBCUTANEOUS
  Filled 2016-07-16 (×3): qty 1

## 2016-07-16 MED ORDER — DELFLEX-LC/1.5% DEXTROSE 344 MOSM/L IP SOLN: INTRAPERITONEAL | Status: DC

## 2016-07-16 MED ORDER — LORAZEPAM 2 MG/ML IJ SOLN
0.5000 mg | Freq: Once | INTRAMUSCULAR | Status: AC
  Administered 2016-07-16: 0.5 mg via INTRAVENOUS
  Filled 2016-07-16: qty 1

## 2016-07-16 MED ORDER — HEPARIN 1000 UNIT/ML FOR PERITONEAL DIALYSIS: 500.0000 [IU] | INTRAMUSCULAR | Status: DC | PRN

## 2016-07-16 MED ORDER — DIVALPROEX SODIUM 250 MG PO DR TAB
250.0000 mg | DELAYED_RELEASE_TABLET | Freq: Every day | ORAL | Status: DC
  Administered 2016-07-16 – 2016-07-17 (×2): 250 mg via ORAL
  Filled 2016-07-16 (×3): qty 1

## 2016-07-16 MED ORDER — HEPARIN 1000 UNIT/ML FOR PERITONEAL DIALYSIS
INTRAPERITONEAL | Status: DC | PRN
  Filled 2016-07-16: qty 5000

## 2016-07-16 MED ORDER — GENTAMICIN SULFATE 0.1 % EX CREA
1.0000 "application " | TOPICAL_CREAM | Freq: Every day | CUTANEOUS | Status: DC
  Administered 2016-07-17: 1 via TOPICAL
  Filled 2016-07-16: qty 15

## 2016-07-16 MED ORDER — CEFAZOLIN SODIUM-DEXTROSE 1-4 GM/50ML-% IV SOLN
1.0000 g | INTRAVENOUS | Status: DC
  Filled 2016-07-16: qty 50

## 2016-07-16 NOTE — Progress Notes (Signed)
Darren Dunn Progress Note   Background: Patient admitted yesterday for possible peritonitis. Apparently patient has not been capping PD catheter appropriately at home. Was seen by home therapies-catheter recapped and he was started on Keflex 500 mg PO BID. Patient present here yesterday with worsening abdominal pain, cloudy PD fluid.Patient became confused during night, kept attempting to assist with PD even though he was told not to assist. Was placed in mittens which he chewed off. Plans in place to transition to hemodialysis. Has clotted RUA AVG-VVS has been asked to see pt for declot/possible perm cath placement.   Subjective: "You need to unhook me, I am going home!" Denies abdominal tenderness. Sitting up on side of bed with shoes on. Oriented to person, knows he is in hospital, vague about date/time. Has pulled off heart monitor, asking for IV/PD cath to be disconnected.   Objective Vitals:   07/15/16 1705 07/15/16 2153 07/16/16 0600 07/16/16 0911  BP:  140/72 104/67 115/78  Pulse:  84 100 92  Resp:  18  18  Temp:  99.4 F (37.4 C) 99 F (37.2 C) 98.2 F (36.8 C)  TempSrc:  Oral Oral Oral  SpO2:  97% 95% 98%  Weight: 62.2 kg (137 lb 1.6 oz) 63.3 kg (139 lb 8.8 oz)    Height: 5\' 8"  (1.727 m)      Physical Exam General: Chronically ill appearing elderly male in NAD Heart: P3,I9, 2/6 systolic M.  Lungs: CTAB A/P Abdomen: LLQ PD cath. Drsg intact. Active BS, does not appear tender to palpation.  Extremities: 2+ LE edema.  Dialysis Access: RUA AVG No bruit/thrill   Additional Objective Labs: Basic Metabolic Panel:  Recent Labs Lab 07/15/16 0241 07/16/16 0711  NA 130* 127*  K 3.5 3.1*  CL 91* 93*  CO2 26 22  GLUCOSE 83 112*  BUN 40* 45*  CREATININE 6.67* 6.43*  CALCIUM 8.9 8.2*   Liver Function Tests:  Recent Labs Lab 07/15/16 0241  AST 22  ALT 14*  ALKPHOS 90  BILITOT 0.6  PROT 6.6  ALBUMIN 2.9*   No results for input(s): LIPASE, AMYLASE  in the last 168 hours. CBC:  Recent Labs Lab 07/15/16 0241 07/16/16 0711  WBC 9.0 6.4  NEUTROABS 7.8*  --   HGB 13.0 10.0*  HCT 39.4 30.7*  MCV 84.9 83.9  PLT 184 162   Blood Culture    Component Value Date/Time   SDES ABDOMEN 07/15/2016 1133   SPECREQUEST NONE 07/15/2016 1133   CULT NO GROWTH 5 DAYS 04/28/2016 1828   REPTSTATUS 07/15/2016 FINAL 07/15/2016 1133    Cardiac Enzymes: No results for input(s): CKTOTAL, CKMB, CKMBINDEX, TROPONINI in the last 168 hours. CBG: No results for input(s): GLUCAP in the last 168 hours. Iron Studies: No results for input(s): IRON, TIBC, TRANSFERRIN, FERRITIN in the last 72 hours. @lablastinr3 @ Studies/Results: Dg Chest 2 View  Result Date: 07/15/2016 CLINICAL DATA:  Acute onset of fever.  Initial encounter. EXAM: CHEST  2 VIEW COMPARISON:  Chest radiograph performed 04/27/2016 FINDINGS: The lungs are well-aerated. Mild right midlung and left basilar airspace opacities may reflect mild pneumonia. There is no evidence of pleural effusion or pneumothorax. The heart is borderline normal in size. A vascular stent is noted about the right hemithorax. No acute osseous abnormalities are seen. Scattered calcification is seen along the proximal abdominal aorta. IMPRESSION: 1. Mild right midlung and left basilar airspace opacities may reflect mild pneumonia, given the patient's symptoms. 2. Scattered aortic atherosclerosis. Electronically Signed  By: Garald Balding M.D.   On: 07/15/2016 06:09   Medications: . cefTAZidime (FORTAZ)  IV    . dialysis solution 1.5% low-MG/low-CA     . cholecalciferol  2,000 Units Oral Daily  . divalproex  250 mg Oral Daily  . divalproex  500 mg Oral QPM  . feeding supplement (NEPRO CARB STEADY)  237 mL Oral BID BM  . gentamicin cream  1 application Topical Daily  . haloperidol  2 mg Oral QHS  . levothyroxine  50 mcg Oral QAC breakfast  . losartan  50 mg Oral QHS  . sodium chloride flush  3 mL Intravenous Q12H    Dialysis: CCPD 4 exchanges/ 24 hrs, 3 L dwell, 62kg dry wt, 1.5 hours; no day bag and no pause  Assessment/Plan: 1. Possible PD cath related peritonitis. Low grade temps during night. PD WBC 12,625. WBC 6.4. Has been started on Vanc/Fortaz.  2. ESRD -currently on PD using 1.5% dianeal solution. Will start HD tomorrow after access has been declotted or HD catheter placed. K+ 3.1 Use 4.0 K bath. 200/500 BFR/DFR for 1st treatments to avoid disequilibrium syndrome. Start CLIP process.  3. Anemia - HGB 10.0 No ESA yet-follow CBC 4. Secondary hyperparathyroidism - check renal function panel. Last OP Phos 1.5. No binders/VDRA.  5. HTN/volume - BP controlled. Has LE pitting edema. No vascular congestion seen on exam or CXR. Attempt 3 liters tomorrow but may need to adjust. See how he tolerates. Losartan 25 mg PO Q hs on OP med list.  6. Nutrition - Albumin 2.9 Low K+ Low Phos. Dys 3 diet. Prostat.  7. H/O bipolar/schizo disorder. On Depakote and haldol. Per primary.   Darren Fester H. Atwood Adcock NP-C 07/16/2016, 11:23 AM  [employer] [phone]
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
## 2016-07-16 NOTE — Progress Notes (Signed)
Pt has been increasingly trying to get out of bed and change is PD settings after repeatedly being told to stop. Mittens were applied to pt but he bit them off to again change is PD settings. [employer] company notified and verified that settings that pt switched too would do no harm to him and that the machine would switch back to previous settings after. Dr. Olevia Bowens notified and ordered one time dose of 0.5mg  of Ativan. Medication administered and pt tolerated well. Will continue to monitor.
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
## 2016-07-17 ENCOUNTER — Encounter (HOSPITAL_COMMUNITY)
Admission: EM | Disposition: A | Discharge status: Home / Self Care | Payer: Self-pay | Source: Home / Self Care | Attending: Internal Medicine

## 2016-07-17 DIAGNOSIS — R17 Unspecified jaundice: Secondary | ICD-10-CM | POA: Diagnosis not present

## 2016-07-17 DIAGNOSIS — K769 Liver disease, unspecified: Secondary | ICD-10-CM | POA: Diagnosis not present

## 2016-07-17 DIAGNOSIS — K650 Generalized (acute) peritonitis: Secondary | ICD-10-CM | POA: Diagnosis not present

## 2016-07-17 DIAGNOSIS — D631 Anemia in chronic kidney disease: Secondary | ICD-10-CM | POA: Diagnosis not present

## 2016-07-17 DIAGNOSIS — D630 Anemia in neoplastic disease: Secondary | ICD-10-CM | POA: Diagnosis not present

## 2016-07-17 DIAGNOSIS — N186 End stage renal disease: Secondary | ICD-10-CM | POA: Diagnosis not present

## 2016-07-17 LAB — CBC
HCT: 31 % — ABNORMAL LOW (ref 39.0–52.0)
Hemoglobin: 10.3 g/dL — ABNORMAL LOW (ref 13.0–17.0)
MCH: 27.7 pg (ref 26.0–34.0)
MCHC: 33.2 g/dL (ref 30.0–36.0)
MCV: 83.3 fL (ref 78.0–100.0)
PLATELETS: 176 10*3/uL (ref 150–400)
RBC: 3.72 MIL/uL — ABNORMAL LOW (ref 4.22–5.81)
RDW: 19.6 % — AB (ref 11.5–15.5)
WBC: 8 10*3/uL (ref 4.0–10.5)

## 2016-07-17 LAB — BASIC METABOLIC PANEL
Anion gap: 10 (ref 5–15)
BUN: 42 mg/dL — AB (ref 6–20)
CHLORIDE: 89 mmol/L — AB (ref 101–111)
CO2: 26 mmol/L (ref 22–32)
CREATININE: 5.79 mg/dL — AB (ref 0.61–1.24)
Calcium: 8.2 mg/dL — ABNORMAL LOW (ref 8.9–10.3)
GFR calc Af Amer: 10 mL/min — ABNORMAL LOW (ref >60)
GFR calc non Af Amer: 9 mL/min — ABNORMAL LOW (ref >60)
Glucose, Bld: 88 mg/dL (ref 65–99)
Potassium: 2.9 mmol/L — ABNORMAL LOW (ref 3.5–5.1)
Sodium: 125 mmol/L — ABNORMAL LOW (ref 135–145)

## 2016-07-17 LAB — PROTIME-INR
INR: 1.01
Prothrombin Time: 13.3 seconds (ref 11.4–15.2)

## 2016-07-17 LAB — SURGICAL PCR SCREEN
MRSA, PCR: NEGATIVE
STAPHYLOCOCCUS AUREUS: NEGATIVE

## 2016-07-17 LAB — PATHOLOGIST SMEAR REVIEW

## 2016-07-17 SURGERY — THROMBECTOMY ARTERIOVENOUS GORE-TEX GRAFT
Anesthesia: Monitor Anesthesia Care | Site: Arm Upper | Laterality: Right

## 2016-07-17 MED ORDER — LORAZEPAM 2 MG/ML IJ SOLN: 1.0000 mg | INTRAMUSCULAR | Status: DC

## 2016-07-17 MED ORDER — DEXTROSE 5 % IV SOLN
500.0000 mg | INTRAVENOUS | Status: DC
  Administered 2016-07-17: 500 mg via INTRAVENOUS
  Filled 2016-07-17: qty 500

## 2016-07-17 MED ORDER — AZITHROMYCIN 500 MG PO TABS: 500.0000 mg | ORAL_TABLET | Freq: Every day | ORAL | 0 refills | Status: DC

## 2016-07-17 MED ORDER — POTASSIUM CHLORIDE CRYS ER 20 MEQ PO TBCR
40.0000 meq | EXTENDED_RELEASE_TABLET | Freq: Once | ORAL | Status: AC
  Administered 2016-07-17: 40 meq via ORAL
  Filled 2016-07-17: qty 2

## 2016-07-17 MED ORDER — DEXTROSE 5 % IV SOLN
500.0000 mg | INTRAVENOUS | Status: DC
  Administered 2016-07-17: 500 mg via INTRAVENOUS
  Filled 2016-07-17 (×2): qty 0.5

## 2016-07-17 MED ORDER — DEXTROSE 5 % IV SOLN: INTRAVENOUS | 10 refills | Status: DC

## 2016-07-17 NOTE — Care Management Note (Signed)
Case Management Note  Patient Details  Name: ORVAL DORTCH MRN: [id number] Date of Birth: 1945/08/12  Subjective/Objective:         CM following for progression and d/c planning.            Action/Plan: 07/17/2016 Pt for d/c to home , no HH needs. Pt will followup at the HD center where the will receive training and support for HD staff working with his PD needs. No other needs identified.   Expected Discharge Date:  07/17/16               Expected Discharge Plan:  Home/Self Care  In-House Referral:  NA  Discharge planning Services  NA  Post Acute Care Choice:  NA Choice offered to:  NA  DME Arranged:  N/A DME Agency:  NA  HH Arranged:  NA HH Agency:  NA  Status of Service:  Completed, signed off  If discussed at [place] of Stay Meetings, dates discussed:    Additional Comments:  Adron Bene, RN 07/17/2016, 11:02 AM

## 2016-07-17 NOTE — Discharge Instructions (Signed)
Follow with Primary MD Claretta Fraise, MD in 7 days   Get CBC, CMP, 2 view Chest X ray checked  by Primary MD or SNF MD in 5-7 days ( we routinely change or add medications that can affect your baseline labs and fluid status, therefore we recommend that you get the mentioned basic workup next visit with your PCP, your PCP may decide not to get them or add new tests based on their clinical decision)  Activity: As tolerated with Full fall precautions use walker/cane & assistance as needed  Disposition Home    Diet:   Diet renal with fluid restriction Fluid restriction: 1200 mL    For Heart failure patients - Check your Weight same time everyday, if you gain over 2 pounds, or you develop in leg swelling, experience more shortness of breath or chest pain, call your Primary MD immediately. Follow Cardiac Low Salt Diet and 1.5 lit/day fluid restriction.  On your next visit with your primary care physician please Get Medicines reviewed and adjusted.  Please request your Prim.MD to go over all Hospital Tests and Procedure/Radiological results at the follow up, please get all Hospital records sent to your Prim MD by signing hospital release before you go home.  If you experience worsening of your admission symptoms, develop shortness of breath, life threatening emergency, suicidal or homicidal thoughts you must seek medical attention immediately by calling 911 or calling your MD immediately  if symptoms less severe.  You Must read complete instructions/literature along with all the possible adverse reactions/side effects for all the Medicines you take and that have been prescribed to you. Take any new Medicines after you have completely understood and accpet all the possible adverse reactions/side effects.   Do not drive, operate heavy machinery, perform activities at heights, swimming or participation in water activities or provide baby sitting services if your were admitted for syncope or siezures until  you have seen by Primary MD or a Neurologist and advised to do so again.  Do not drive when taking Pain medications.    Do not take more than prescribed Pain, Sleep and Anxiety Medications  Special Instructions: If you have smoked or chewed Tobacco  in the last 2 yrs please stop smoking, stop any regular Alcohol  and or any Recreational drug use.  Wear Seat belts while driving.   Please note  You were cared for by a hospitalist during your hospital stay. If you have any questions about your discharge medications or the care you received while you were in the hospital after you are discharged, you can call the unit and asked to speak with the hospitalist on call if the hospitalist that took care of you is not available. Once you are discharged, your primary care physician will handle any further medical issues. Please note that NO REFILLS for any discharge medications will be authorized once you are discharged, as it is imperative that you return to your primary care physician (or establish a relationship with a primary care physician if you do not have one) for your aftercare needs so that they can reassess your need for medications and monitor your lab values.

## 2016-07-17 NOTE — Progress Notes (Signed)
Discharge instructions given and discussed with son at bedside, medications, follow up appointments reviewed, verbalized understanding. Telemetry DC'ed, CCMD notified. Pt will be discharge to home after IV ABX infusion.
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
## 2016-07-17 NOTE — Progress Notes (Signed)
[facility] Progress Note   Background: Patient admitted 6/17 for possible peritonitis. Apparently patient has not been capping PD catheter appropriately at home. Was seen by home therapies-catheter recapped and he was started on Keflex 500 mg PO BID. Patient present here yesterday with worsening abdominal pain, cloudy PD fluid.Patient became confused during night, kept attempting to assist with PD even though he was told not to assist. Was placed in mittens which he chewed off.  Has clotted RUA AVG-VVS has been asked to see pt for declot/possible perm cath placement.    Subjective:  Seen in room with son present. Son questioning need for AVG declot. The family wishes to continue PD and does not want to transition to HD. Patient days he feels fine today. Denies abdominal pain, chills, N, V, D.   Objective Vitals:   07/16/16 2002 07/17/16 0403 07/17/16 0433 07/17/16 0700  BP: (!) 158/90  (!) 161/85   Pulse: 95  80   Resp: 16  14   Temp: 98.6 F (37 C)  97.5 F (36.4 C) 97.5 F (36.4 C)  TempSrc:      SpO2: 97%  98%   Weight:  66.9 kg (147 lb 7.8 oz)    Height:       Physical Exam General: Chronically ill appearing elderly male in NAD Heart: T7,D2, 2/6 systolic M.  Lungs: CTAB A/P Abdomen: LLQ PD cath. Drsg intact. Active BS, does not appear tender to palpation.  Extremities: 2+ LE edema.  Dialysis Access: RUA AVG No bruit/thrill   Additional Objective Labs: Basic Metabolic Panel:  Recent Labs Lab 07/15/16 0241 07/16/16 0711 07/17/16 0617  NA 130* 127* 125*  K 3.5 3.1* 2.9*  CL 91* 93* 89*  CO2 26 22 26   GLUCOSE 83 112* 88  BUN 40* 45* 42*  CREATININE 6.67* 6.43* 5.79*  CALCIUM 8.9 8.2* 8.2*   Liver Function Tests:  Recent Labs Lab 07/15/16 0241  AST 22  ALT 14*  ALKPHOS 90  BILITOT 0.6  PROT 6.6  ALBUMIN 2.9*   No results for input(s): LIPASE, AMYLASE in the last 168 hours. CBC:  Recent Labs Lab 07/15/16 0241 07/16/16 0711  07/17/16 0617  WBC 9.0 6.4 8.0  NEUTROABS 7.8*  --   --   HGB 13.0 10.0* 10.3*  HCT 39.4 30.7* 31.0*  MCV 84.9 83.9 83.3  PLT 184 162 176   Blood Culture    Component Value Date/Time   SDES ABDOMEN 07/15/2016 1134   SPECREQUEST NONE 07/15/2016 1134   CULT NO GROWTH 1 DAY 07/15/2016 1134   REPTSTATUS PENDING 07/15/2016 1134    Cardiac Enzymes: No results for input(s): CKTOTAL, CKMB, CKMBINDEX, TROPONINI in the last 168 hours. CBG: No results for input(s): GLUCAP in the last 168 hours. Iron Studies: No results for input(s): IRON, TIBC, TRANSFERRIN, FERRITIN in the last 72 hours. @lablastinr3 @ Studies/Results: No results found. Medications: . azithromycin    .  ceFAZolin (ANCEF) IV    . cefTAZidime (FORTAZ)  IV Stopped (07/16/16 1750)  . dialysis solution 1.5% low-MG/low-CA     . cholecalciferol  2,000 Units Oral Daily  . divalproex  250 mg Oral Daily  . divalproex  500 mg Oral QPM  . feeding supplement (NEPRO CARB STEADY)  237 mL Oral BID BM  . gentamicin cream  1 application Topical Daily  . haloperidol  2 mg Oral QHS  . heparin subcutaneous  5,000 Units Subcutaneous Q8H  . levothyroxine  50 mcg Oral QAC breakfast  .  losartan  25 mg Oral QHS  . sodium chloride flush  3 mL Intravenous Q12H   Dialysis: CCPD 4 exchanges/ 24 hrs, 3 L dwell, 62kg dry wt, 1.5 hours; no day bag and no pause  Assessment/Plan: 1. PD cath related peritonitis. Low grade temps during night. PD WBC 12,625. WBC 6.4. Has been started on PD fluid cx+GNR > On [place] is to continue antibiotic therapy via Home Training when discharged.  2. ESRD -currently on PD using 1.5% dianeal solution. Family wishes to continue PD and do not want to transition to HD. Son says he plans to be present and assist with PD sessions going forward.  3. Hypokalemia - K 2.9 - KCl 40 dosed  3. Anemia - HGB >10 No ESA yet-follow CBC 4. Secondary hyperparathyroidism - . Last OP Phos 1.5. No binders/VDRA.  5. HTN/volume -  BP controlled. Has LE pitting edema. No vascular congestion seen on exam or CXR. Losartan 25 mg PO Q hs on OP med list.  6. Nutrition - Albumin 2.9 Low K+ Low Phos. Dys 3 diet. Prostat.  7. H/O bipolar/schizo disorder. On Depakote and haldol. Per primary.   Lynnda Child PA-C [facility] Pager [phone] 07/17/2016,9:13 AM

## 2016-07-17 NOTE — Progress Notes (Signed)
Critical Lab Value Body Fluid Culture- Gram Negative Rods only in Aerobic Bottle only.  MD notified.

## 2016-07-17 NOTE — Progress Notes (Signed)
   Family has elected to continue pd and no pursue further hd. Procedure to salvage upper arm graft has been cancelled. Will see again if hd needed.   [place] Arabie C. Donzetta Matters, MD [facility] Office: [phone] Pager: [phone]
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
## 2016-07-18 DIAGNOSIS — D630 Anemia in neoplastic disease: Secondary | ICD-10-CM | POA: Diagnosis not present

## 2016-07-18 DIAGNOSIS — N186 End stage renal disease: Secondary | ICD-10-CM | POA: Diagnosis not present

## 2016-07-18 DIAGNOSIS — K650 Generalized (acute) peritonitis: Secondary | ICD-10-CM | POA: Diagnosis not present

## 2016-07-18 DIAGNOSIS — K769 Liver disease, unspecified: Secondary | ICD-10-CM | POA: Diagnosis not present

## 2016-07-18 DIAGNOSIS — D631 Anemia in chronic kidney disease: Secondary | ICD-10-CM | POA: Diagnosis not present

## 2016-07-18 DIAGNOSIS — R17 Unspecified jaundice: Secondary | ICD-10-CM | POA: Diagnosis not present

## 2016-07-19 ENCOUNTER — Ambulatory Visit: Payer: Medicare Other | Admitting: General Surgery

## 2016-07-19 ENCOUNTER — Telehealth: Payer: Self-pay | Admitting: *Deleted

## 2016-07-19 DIAGNOSIS — K769 Liver disease, unspecified: Secondary | ICD-10-CM | POA: Diagnosis not present

## 2016-07-19 DIAGNOSIS — N186 End stage renal disease: Secondary | ICD-10-CM | POA: Diagnosis not present

## 2016-07-19 DIAGNOSIS — D630 Anemia in neoplastic disease: Secondary | ICD-10-CM | POA: Diagnosis not present

## 2016-07-19 DIAGNOSIS — K650 Generalized (acute) peritonitis: Secondary | ICD-10-CM | POA: Diagnosis not present

## 2016-07-19 DIAGNOSIS — D631 Anemia in chronic kidney disease: Secondary | ICD-10-CM | POA: Diagnosis not present

## 2016-07-19 DIAGNOSIS — R17 Unspecified jaundice: Secondary | ICD-10-CM | POA: Diagnosis not present

## 2016-07-19 LAB — CULTURE, BODY FLUID-BOTTLE

## 2016-07-19 LAB — CULTURE, BODY FLUID W GRAM STAIN -BOTTLE

## 2016-07-19 NOTE — Telephone Encounter (Signed)
error 

## 2016-07-20 DIAGNOSIS — D631 Anemia in chronic kidney disease: Secondary | ICD-10-CM | POA: Diagnosis not present

## 2016-07-20 DIAGNOSIS — K769 Liver disease, unspecified: Secondary | ICD-10-CM | POA: Diagnosis not present

## 2016-07-20 DIAGNOSIS — N186 End stage renal disease: Secondary | ICD-10-CM | POA: Diagnosis not present

## 2016-07-20 DIAGNOSIS — D630 Anemia in neoplastic disease: Secondary | ICD-10-CM | POA: Diagnosis not present

## 2016-07-20 DIAGNOSIS — R17 Unspecified jaundice: Secondary | ICD-10-CM | POA: Diagnosis not present

## 2016-07-20 DIAGNOSIS — H903 Sensorineural hearing loss, bilateral: Secondary | ICD-10-CM | POA: Diagnosis not present

## 2016-07-20 DIAGNOSIS — K650 Generalized (acute) peritonitis: Secondary | ICD-10-CM | POA: Diagnosis not present

## 2016-07-20 LAB — CULTURE, BLOOD (ROUTINE X 2)
Culture: NO GROWTH
Culture: NO GROWTH
Special Requests: ADEQUATE

## 2016-07-21 DIAGNOSIS — D630 Anemia in neoplastic disease: Secondary | ICD-10-CM | POA: Diagnosis not present

## 2016-07-21 DIAGNOSIS — K769 Liver disease, unspecified: Secondary | ICD-10-CM | POA: Diagnosis not present

## 2016-07-21 DIAGNOSIS — K650 Generalized (acute) peritonitis: Secondary | ICD-10-CM | POA: Diagnosis not present

## 2016-07-21 DIAGNOSIS — R17 Unspecified jaundice: Secondary | ICD-10-CM | POA: Diagnosis not present

## 2016-07-21 DIAGNOSIS — N186 End stage renal disease: Secondary | ICD-10-CM | POA: Diagnosis not present

## 2016-07-21 DIAGNOSIS — D631 Anemia in chronic kidney disease: Secondary | ICD-10-CM | POA: Diagnosis not present

## 2016-07-22 DIAGNOSIS — K650 Generalized (acute) peritonitis: Secondary | ICD-10-CM | POA: Diagnosis not present

## 2016-07-22 DIAGNOSIS — R17 Unspecified jaundice: Secondary | ICD-10-CM | POA: Diagnosis not present

## 2016-07-22 DIAGNOSIS — N186 End stage renal disease: Secondary | ICD-10-CM | POA: Diagnosis not present

## 2016-07-22 DIAGNOSIS — K769 Liver disease, unspecified: Secondary | ICD-10-CM | POA: Diagnosis not present

## 2016-07-22 DIAGNOSIS — D631 Anemia in chronic kidney disease: Secondary | ICD-10-CM | POA: Diagnosis not present

## 2016-07-22 DIAGNOSIS — D630 Anemia in neoplastic disease: Secondary | ICD-10-CM | POA: Diagnosis not present

## 2016-07-23 ENCOUNTER — Ambulatory Visit: Payer: Medicare Other | Admitting: Family Medicine

## 2016-07-23 ENCOUNTER — Other Ambulatory Visit: Payer: Self-pay | Admitting: Family Medicine

## 2016-07-23 DIAGNOSIS — N186 End stage renal disease: Secondary | ICD-10-CM | POA: Diagnosis not present

## 2016-07-23 DIAGNOSIS — K769 Liver disease, unspecified: Secondary | ICD-10-CM | POA: Diagnosis not present

## 2016-07-23 DIAGNOSIS — D631 Anemia in chronic kidney disease: Secondary | ICD-10-CM | POA: Diagnosis not present

## 2016-07-23 DIAGNOSIS — R17 Unspecified jaundice: Secondary | ICD-10-CM | POA: Diagnosis not present

## 2016-07-23 DIAGNOSIS — D630 Anemia in neoplastic disease: Secondary | ICD-10-CM | POA: Diagnosis not present

## 2016-07-23 DIAGNOSIS — K650 Generalized (acute) peritonitis: Secondary | ICD-10-CM | POA: Diagnosis not present

## 2016-07-24 ENCOUNTER — Encounter: Payer: Self-pay | Admitting: Family Medicine

## 2016-07-24 DIAGNOSIS — K650 Generalized (acute) peritonitis: Secondary | ICD-10-CM | POA: Diagnosis not present

## 2016-07-24 DIAGNOSIS — K769 Liver disease, unspecified: Secondary | ICD-10-CM | POA: Diagnosis not present

## 2016-07-24 DIAGNOSIS — D631 Anemia in chronic kidney disease: Secondary | ICD-10-CM | POA: Diagnosis not present

## 2016-07-24 DIAGNOSIS — R17 Unspecified jaundice: Secondary | ICD-10-CM | POA: Diagnosis not present

## 2016-07-24 DIAGNOSIS — D630 Anemia in neoplastic disease: Secondary | ICD-10-CM | POA: Diagnosis not present

## 2016-07-24 DIAGNOSIS — N186 End stage renal disease: Secondary | ICD-10-CM | POA: Diagnosis not present

## 2016-07-24 NOTE — Telephone Encounter (Signed)
Phone call completed in different encounter.  This encounter will now be closed

## 2016-07-25 ENCOUNTER — Ambulatory Visit
Admission: RE | Admit: 2016-07-25 | Discharge: 2016-07-25 | Disposition: A | Discharge status: Home / Self Care | Payer: Medicare Other | Source: Ambulatory Visit | Attending: Nephrology | Admitting: Nephrology

## 2016-07-25 ENCOUNTER — Other Ambulatory Visit: Payer: Self-pay | Admitting: Nephrology

## 2016-07-25 DIAGNOSIS — R05 Cough: Secondary | ICD-10-CM | POA: Diagnosis not present

## 2016-07-25 DIAGNOSIS — R17 Unspecified jaundice: Secondary | ICD-10-CM | POA: Diagnosis not present

## 2016-07-25 DIAGNOSIS — N186 End stage renal disease: Secondary | ICD-10-CM | POA: Diagnosis not present

## 2016-07-25 DIAGNOSIS — J189 Pneumonia, unspecified organism: Secondary | ICD-10-CM

## 2016-07-25 DIAGNOSIS — K650 Generalized (acute) peritonitis: Secondary | ICD-10-CM | POA: Diagnosis not present

## 2016-07-25 DIAGNOSIS — K769 Liver disease, unspecified: Secondary | ICD-10-CM | POA: Diagnosis not present

## 2016-07-25 DIAGNOSIS — D631 Anemia in chronic kidney disease: Secondary | ICD-10-CM | POA: Diagnosis not present

## 2016-07-25 DIAGNOSIS — D630 Anemia in neoplastic disease: Secondary | ICD-10-CM | POA: Diagnosis not present

## 2016-07-25 DIAGNOSIS — J9811 Atelectasis: Secondary | ICD-10-CM | POA: Diagnosis not present

## 2016-07-26 DIAGNOSIS — N186 End stage renal disease: Secondary | ICD-10-CM | POA: Diagnosis not present

## 2016-07-26 DIAGNOSIS — R17 Unspecified jaundice: Secondary | ICD-10-CM | POA: Diagnosis not present

## 2016-07-26 DIAGNOSIS — K769 Liver disease, unspecified: Secondary | ICD-10-CM | POA: Diagnosis not present

## 2016-07-26 DIAGNOSIS — D630 Anemia in neoplastic disease: Secondary | ICD-10-CM | POA: Diagnosis not present

## 2016-07-26 DIAGNOSIS — K650 Generalized (acute) peritonitis: Secondary | ICD-10-CM | POA: Diagnosis not present

## 2016-07-26 DIAGNOSIS — D631 Anemia in chronic kidney disease: Secondary | ICD-10-CM | POA: Diagnosis not present

## 2016-07-27 ENCOUNTER — Telehealth: Payer: Self-pay | Admitting: Family Medicine

## 2016-07-27 DIAGNOSIS — D630 Anemia in neoplastic disease: Secondary | ICD-10-CM | POA: Diagnosis not present

## 2016-07-27 DIAGNOSIS — K769 Liver disease, unspecified: Secondary | ICD-10-CM | POA: Diagnosis not present

## 2016-07-27 DIAGNOSIS — R17 Unspecified jaundice: Secondary | ICD-10-CM | POA: Diagnosis not present

## 2016-07-27 DIAGNOSIS — N186 End stage renal disease: Secondary | ICD-10-CM | POA: Diagnosis not present

## 2016-07-27 DIAGNOSIS — D631 Anemia in chronic kidney disease: Secondary | ICD-10-CM | POA: Diagnosis not present

## 2016-07-27 DIAGNOSIS — K650 Generalized (acute) peritonitis: Secondary | ICD-10-CM | POA: Diagnosis not present

## 2016-07-27 NOTE — Telephone Encounter (Signed)
Please refer as requested Thanks, WS

## 2016-07-27 NOTE — Telephone Encounter (Signed)
Please advise 

## 2016-07-27 NOTE — Telephone Encounter (Signed)
RX in back for Dr Livia Snellen to sign and return to [employer]

## 2016-07-28 DIAGNOSIS — K650 Generalized (acute) peritonitis: Secondary | ICD-10-CM | POA: Diagnosis not present

## 2016-07-28 DIAGNOSIS — K769 Liver disease, unspecified: Secondary | ICD-10-CM | POA: Diagnosis not present

## 2016-07-28 DIAGNOSIS — D630 Anemia in neoplastic disease: Secondary | ICD-10-CM | POA: Diagnosis not present

## 2016-07-28 DIAGNOSIS — D631 Anemia in chronic kidney disease: Secondary | ICD-10-CM | POA: Diagnosis not present

## 2016-07-28 DIAGNOSIS — R17 Unspecified jaundice: Secondary | ICD-10-CM | POA: Diagnosis not present

## 2016-07-28 DIAGNOSIS — N186 End stage renal disease: Secondary | ICD-10-CM | POA: Diagnosis not present

## 2016-07-29 DIAGNOSIS — D630 Anemia in neoplastic disease: Secondary | ICD-10-CM | POA: Diagnosis not present

## 2016-07-29 DIAGNOSIS — K769 Liver disease, unspecified: Secondary | ICD-10-CM | POA: Diagnosis not present

## 2016-07-29 DIAGNOSIS — R880 Cloudy (hemodialysis) (peritoneal) dialysis effluent: Secondary | ICD-10-CM | POA: Diagnosis not present

## 2016-07-29 DIAGNOSIS — D509 Iron deficiency anemia, unspecified: Secondary | ICD-10-CM | POA: Diagnosis not present

## 2016-07-29 DIAGNOSIS — N2581 Secondary hyperparathyroidism of renal origin: Secondary | ICD-10-CM | POA: Diagnosis not present

## 2016-07-29 DIAGNOSIS — E871 Hypo-osmolality and hyponatremia: Secondary | ICD-10-CM

## 2016-07-29 DIAGNOSIS — N2589 Other disorders resulting from impaired renal tubular function: Secondary | ICD-10-CM | POA: Diagnosis not present

## 2016-07-29 DIAGNOSIS — D631 Anemia in chronic kidney disease: Secondary | ICD-10-CM | POA: Diagnosis not present

## 2016-07-29 DIAGNOSIS — Z4932 Encounter for adequacy testing for peritoneal dialysis: Secondary | ICD-10-CM | POA: Diagnosis not present

## 2016-07-29 DIAGNOSIS — N186 End stage renal disease: Secondary | ICD-10-CM | POA: Diagnosis not present

## 2016-07-29 DIAGNOSIS — K659 Peritonitis, unspecified: Secondary | ICD-10-CM

## 2016-07-29 HISTORY — DX: Hypo-osmolality and hyponatremia: E87.1

## 2016-07-29 HISTORY — DX: Peritonitis, unspecified: K65.9

## 2016-07-30 DIAGNOSIS — N186 End stage renal disease: Secondary | ICD-10-CM | POA: Diagnosis not present

## 2016-07-30 DIAGNOSIS — N2581 Secondary hyperparathyroidism of renal origin: Secondary | ICD-10-CM | POA: Diagnosis not present

## 2016-07-30 DIAGNOSIS — R880 Cloudy (hemodialysis) (peritoneal) dialysis effluent: Secondary | ICD-10-CM | POA: Diagnosis not present

## 2016-07-30 DIAGNOSIS — N2589 Other disorders resulting from impaired renal tubular function: Secondary | ICD-10-CM | POA: Diagnosis not present

## 2016-07-30 DIAGNOSIS — D509 Iron deficiency anemia, unspecified: Secondary | ICD-10-CM | POA: Diagnosis not present

## 2016-07-30 DIAGNOSIS — K769 Liver disease, unspecified: Secondary | ICD-10-CM | POA: Diagnosis not present

## 2016-07-31 DIAGNOSIS — N2589 Other disorders resulting from impaired renal tubular function: Secondary | ICD-10-CM | POA: Diagnosis not present

## 2016-07-31 DIAGNOSIS — N2581 Secondary hyperparathyroidism of renal origin: Secondary | ICD-10-CM | POA: Diagnosis not present

## 2016-07-31 DIAGNOSIS — N186 End stage renal disease: Secondary | ICD-10-CM | POA: Diagnosis not present

## 2016-07-31 DIAGNOSIS — D509 Iron deficiency anemia, unspecified: Secondary | ICD-10-CM | POA: Diagnosis not present

## 2016-07-31 DIAGNOSIS — K769 Liver disease, unspecified: Secondary | ICD-10-CM | POA: Diagnosis not present

## 2016-07-31 DIAGNOSIS — R880 Cloudy (hemodialysis) (peritoneal) dialysis effluent: Secondary | ICD-10-CM | POA: Diagnosis not present

## 2016-08-01 DIAGNOSIS — R880 Cloudy (hemodialysis) (peritoneal) dialysis effluent: Secondary | ICD-10-CM | POA: Diagnosis not present

## 2016-08-01 DIAGNOSIS — K769 Liver disease, unspecified: Secondary | ICD-10-CM | POA: Diagnosis not present

## 2016-08-01 DIAGNOSIS — N186 End stage renal disease: Secondary | ICD-10-CM | POA: Diagnosis not present

## 2016-08-01 DIAGNOSIS — D509 Iron deficiency anemia, unspecified: Secondary | ICD-10-CM | POA: Diagnosis not present

## 2016-08-01 DIAGNOSIS — N2581 Secondary hyperparathyroidism of renal origin: Secondary | ICD-10-CM | POA: Diagnosis not present

## 2016-08-01 DIAGNOSIS — N2589 Other disorders resulting from impaired renal tubular function: Secondary | ICD-10-CM | POA: Diagnosis not present

## 2016-08-02 DIAGNOSIS — R880 Cloudy (hemodialysis) (peritoneal) dialysis effluent: Secondary | ICD-10-CM | POA: Diagnosis not present

## 2016-08-02 DIAGNOSIS — N186 End stage renal disease: Secondary | ICD-10-CM | POA: Diagnosis not present

## 2016-08-02 DIAGNOSIS — N2589 Other disorders resulting from impaired renal tubular function: Secondary | ICD-10-CM | POA: Diagnosis not present

## 2016-08-02 DIAGNOSIS — D509 Iron deficiency anemia, unspecified: Secondary | ICD-10-CM | POA: Diagnosis not present

## 2016-08-02 DIAGNOSIS — N2581 Secondary hyperparathyroidism of renal origin: Secondary | ICD-10-CM | POA: Diagnosis not present

## 2016-08-02 DIAGNOSIS — K769 Liver disease, unspecified: Secondary | ICD-10-CM | POA: Diagnosis not present

## 2016-08-03 DIAGNOSIS — N2589 Other disorders resulting from impaired renal tubular function: Secondary | ICD-10-CM | POA: Diagnosis not present

## 2016-08-03 DIAGNOSIS — N186 End stage renal disease: Secondary | ICD-10-CM | POA: Diagnosis not present

## 2016-08-03 DIAGNOSIS — F319 Bipolar disorder, unspecified: Secondary | ICD-10-CM | POA: Diagnosis not present

## 2016-08-03 DIAGNOSIS — D509 Iron deficiency anemia, unspecified: Secondary | ICD-10-CM | POA: Diagnosis not present

## 2016-08-03 DIAGNOSIS — R880 Cloudy (hemodialysis) (peritoneal) dialysis effluent: Secondary | ICD-10-CM | POA: Diagnosis not present

## 2016-08-03 DIAGNOSIS — K769 Liver disease, unspecified: Secondary | ICD-10-CM | POA: Diagnosis not present

## 2016-08-03 DIAGNOSIS — N2581 Secondary hyperparathyroidism of renal origin: Secondary | ICD-10-CM | POA: Diagnosis not present

## 2016-08-04 DIAGNOSIS — N2581 Secondary hyperparathyroidism of renal origin: Secondary | ICD-10-CM | POA: Diagnosis not present

## 2016-08-04 DIAGNOSIS — N186 End stage renal disease: Secondary | ICD-10-CM | POA: Diagnosis not present

## 2016-08-04 DIAGNOSIS — N2589 Other disorders resulting from impaired renal tubular function: Secondary | ICD-10-CM | POA: Diagnosis not present

## 2016-08-04 DIAGNOSIS — K769 Liver disease, unspecified: Secondary | ICD-10-CM | POA: Diagnosis not present

## 2016-08-04 DIAGNOSIS — D509 Iron deficiency anemia, unspecified: Secondary | ICD-10-CM | POA: Diagnosis not present

## 2016-08-04 DIAGNOSIS — R880 Cloudy (hemodialysis) (peritoneal) dialysis effluent: Secondary | ICD-10-CM | POA: Diagnosis not present

## 2016-08-05 DIAGNOSIS — N2589 Other disorders resulting from impaired renal tubular function: Secondary | ICD-10-CM | POA: Diagnosis not present

## 2016-08-05 DIAGNOSIS — D509 Iron deficiency anemia, unspecified: Secondary | ICD-10-CM | POA: Diagnosis not present

## 2016-08-05 DIAGNOSIS — R880 Cloudy (hemodialysis) (peritoneal) dialysis effluent: Secondary | ICD-10-CM | POA: Diagnosis not present

## 2016-08-05 DIAGNOSIS — K769 Liver disease, unspecified: Secondary | ICD-10-CM | POA: Diagnosis not present

## 2016-08-05 DIAGNOSIS — N186 End stage renal disease: Secondary | ICD-10-CM | POA: Diagnosis not present

## 2016-08-05 DIAGNOSIS — N2581 Secondary hyperparathyroidism of renal origin: Secondary | ICD-10-CM | POA: Diagnosis not present

## 2016-08-06 ENCOUNTER — Inpatient Hospital Stay (HOSPITAL_COMMUNITY)
Admission: EM | Admit: 2016-08-06 | Discharge: 2016-08-13 | DRG: 981 | Disposition: A | Discharge status: Home Health | Payer: Medicare Other | Attending: Internal Medicine | Admitting: Internal Medicine

## 2016-08-06 ENCOUNTER — Inpatient Hospital Stay (HOSPITAL_COMMUNITY): Payer: Medicare Other

## 2016-08-06 ENCOUNTER — Encounter (HOSPITAL_COMMUNITY): Payer: Self-pay | Admitting: Emergency Medicine

## 2016-08-06 ENCOUNTER — Emergency Department (HOSPITAL_COMMUNITY): Payer: Medicare Other

## 2016-08-06 DIAGNOSIS — E039 Hypothyroidism, unspecified: Secondary | ICD-10-CM | POA: Diagnosis present

## 2016-08-06 DIAGNOSIS — I120 Hypertensive chronic kidney disease with stage 5 chronic kidney disease or end stage renal disease: Secondary | ICD-10-CM | POA: Diagnosis present

## 2016-08-06 DIAGNOSIS — T8571XA Infection and inflammatory reaction due to peritoneal dialysis catheter, initial encounter: Secondary | ICD-10-CM | POA: Diagnosis present

## 2016-08-06 DIAGNOSIS — Z952 Presence of prosthetic heart valve: Secondary | ICD-10-CM | POA: Diagnosis not present

## 2016-08-06 DIAGNOSIS — D638 Anemia in other chronic diseases classified elsewhere: Secondary | ICD-10-CM | POA: Diagnosis present

## 2016-08-06 DIAGNOSIS — Z4902 Encounter for fitting and adjustment of peritoneal dialysis catheter: Secondary | ICD-10-CM | POA: Diagnosis not present

## 2016-08-06 DIAGNOSIS — R0902 Hypoxemia: Secondary | ICD-10-CM

## 2016-08-06 DIAGNOSIS — J90 Pleural effusion, not elsewhere classified: Secondary | ICD-10-CM | POA: Diagnosis not present

## 2016-08-06 DIAGNOSIS — W1830XA Fall on same level, unspecified, initial encounter: Secondary | ICD-10-CM | POA: Diagnosis present

## 2016-08-06 DIAGNOSIS — J189 Pneumonia, unspecified organism: Secondary | ICD-10-CM | POA: Diagnosis not present

## 2016-08-06 DIAGNOSIS — B9689 Other specified bacterial agents as the cause of diseases classified elsewhere: Secondary | ICD-10-CM | POA: Diagnosis present

## 2016-08-06 DIAGNOSIS — N186 End stage renal disease: Secondary | ICD-10-CM

## 2016-08-06 DIAGNOSIS — R1310 Dysphagia, unspecified: Secondary | ICD-10-CM | POA: Diagnosis present

## 2016-08-06 DIAGNOSIS — F319 Bipolar disorder, unspecified: Secondary | ICD-10-CM | POA: Diagnosis present

## 2016-08-06 DIAGNOSIS — J168 Pneumonia due to other specified infectious organisms: Secondary | ICD-10-CM | POA: Diagnosis not present

## 2016-08-06 DIAGNOSIS — N2589 Other disorders resulting from impaired renal tubular function: Secondary | ICD-10-CM | POA: Diagnosis not present

## 2016-08-06 DIAGNOSIS — S41112A Laceration without foreign body of left upper arm, initial encounter: Secondary | ICD-10-CM | POA: Diagnosis present

## 2016-08-06 DIAGNOSIS — R05 Cough: Secondary | ICD-10-CM | POA: Diagnosis not present

## 2016-08-06 DIAGNOSIS — K5900 Constipation, unspecified: Secondary | ICD-10-CM | POA: Diagnosis not present

## 2016-08-06 DIAGNOSIS — R880 Cloudy (hemodialysis) (peritoneal) dialysis effluent: Secondary | ICD-10-CM | POA: Diagnosis not present

## 2016-08-06 DIAGNOSIS — Z992 Dependence on renal dialysis: Secondary | ICD-10-CM | POA: Diagnosis not present

## 2016-08-06 DIAGNOSIS — Y838 Other surgical procedures as the cause of abnormal reaction of the patient, or of later complication, without mention of misadventure at the time of the procedure: Secondary | ICD-10-CM | POA: Diagnosis present

## 2016-08-06 DIAGNOSIS — F17220 Nicotine dependence, chewing tobacco, uncomplicated: Secondary | ICD-10-CM | POA: Diagnosis present

## 2016-08-06 DIAGNOSIS — K659 Peritonitis, unspecified: Secondary | ICD-10-CM | POA: Diagnosis not present

## 2016-08-06 DIAGNOSIS — J9601 Acute respiratory failure with hypoxia: Secondary | ICD-10-CM | POA: Diagnosis not present

## 2016-08-06 DIAGNOSIS — K650 Generalized (acute) peritonitis: Secondary | ICD-10-CM | POA: Diagnosis not present

## 2016-08-06 DIAGNOSIS — I10 Essential (primary) hypertension: Secondary | ICD-10-CM | POA: Diagnosis present

## 2016-08-06 DIAGNOSIS — K769 Liver disease, unspecified: Secondary | ICD-10-CM | POA: Diagnosis not present

## 2016-08-06 DIAGNOSIS — S41111A Laceration without foreign body of right upper arm, initial encounter: Secondary | ICD-10-CM | POA: Diagnosis present

## 2016-08-06 DIAGNOSIS — E8770 Fluid overload, unspecified: Secondary | ICD-10-CM | POA: Diagnosis present

## 2016-08-06 DIAGNOSIS — Z452 Encounter for adjustment and management of vascular access device: Secondary | ICD-10-CM | POA: Diagnosis not present

## 2016-08-06 DIAGNOSIS — A419 Sepsis, unspecified organism: Secondary | ICD-10-CM | POA: Diagnosis present

## 2016-08-06 DIAGNOSIS — Z8249 Family history of ischemic heart disease and other diseases of the circulatory system: Secondary | ICD-10-CM

## 2016-08-06 DIAGNOSIS — N185 Chronic kidney disease, stage 5: Secondary | ICD-10-CM | POA: Diagnosis not present

## 2016-08-06 DIAGNOSIS — M7989 Other specified soft tissue disorders: Secondary | ICD-10-CM | POA: Diagnosis not present

## 2016-08-06 DIAGNOSIS — D649 Anemia, unspecified: Secondary | ICD-10-CM | POA: Diagnosis present

## 2016-08-06 DIAGNOSIS — J9811 Atelectasis: Secondary | ICD-10-CM | POA: Diagnosis present

## 2016-08-06 DIAGNOSIS — F3170 Bipolar disorder, currently in remission, most recent episode unspecified: Secondary | ICD-10-CM | POA: Diagnosis not present

## 2016-08-06 DIAGNOSIS — Z882 Allergy status to sulfonamides status: Secondary | ICD-10-CM

## 2016-08-06 DIAGNOSIS — I340 Nonrheumatic mitral (valve) insufficiency: Secondary | ICD-10-CM | POA: Diagnosis not present

## 2016-08-06 DIAGNOSIS — Y95 Nosocomial condition: Secondary | ICD-10-CM | POA: Diagnosis present

## 2016-08-06 DIAGNOSIS — D509 Iron deficiency anemia, unspecified: Secondary | ICD-10-CM | POA: Diagnosis not present

## 2016-08-06 DIAGNOSIS — E871 Hypo-osmolality and hyponatremia: Secondary | ICD-10-CM

## 2016-08-06 DIAGNOSIS — T82868A Thrombosis of vascular prosthetic devices, implants and grafts, initial encounter: Secondary | ICD-10-CM | POA: Diagnosis not present

## 2016-08-06 DIAGNOSIS — Z86718 Personal history of other venous thrombosis and embolism: Secondary | ICD-10-CM

## 2016-08-06 DIAGNOSIS — E876 Hypokalemia: Secondary | ICD-10-CM | POA: Diagnosis present

## 2016-08-06 DIAGNOSIS — H9190 Unspecified hearing loss, unspecified ear: Secondary | ICD-10-CM | POA: Diagnosis present

## 2016-08-06 DIAGNOSIS — D631 Anemia in chronic kidney disease: Secondary | ICD-10-CM | POA: Diagnosis not present

## 2016-08-06 DIAGNOSIS — D696 Thrombocytopenia, unspecified: Secondary | ICD-10-CM | POA: Diagnosis present

## 2016-08-06 DIAGNOSIS — R600 Localized edema: Secondary | ICD-10-CM | POA: Diagnosis not present

## 2016-08-06 DIAGNOSIS — E784 Other hyperlipidemia: Secondary | ICD-10-CM | POA: Diagnosis not present

## 2016-08-06 DIAGNOSIS — T82898A Other specified complication of vascular prosthetic devices, implants and grafts, initial encounter: Secondary | ICD-10-CM | POA: Diagnosis not present

## 2016-08-06 DIAGNOSIS — E034 Atrophy of thyroid (acquired): Secondary | ICD-10-CM | POA: Diagnosis not present

## 2016-08-06 DIAGNOSIS — R8299 Other abnormal findings in urine: Secondary | ICD-10-CM | POA: Diagnosis not present

## 2016-08-06 DIAGNOSIS — N2581 Secondary hyperparathyroidism of renal origin: Secondary | ICD-10-CM | POA: Diagnosis present

## 2016-08-06 DIAGNOSIS — S0081XA Abrasion of other part of head, initial encounter: Secondary | ICD-10-CM | POA: Diagnosis present

## 2016-08-06 DIAGNOSIS — Z79899 Other long term (current) drug therapy: Secondary | ICD-10-CM

## 2016-08-06 DIAGNOSIS — S0990XA Unspecified injury of head, initial encounter: Secondary | ICD-10-CM | POA: Diagnosis not present

## 2016-08-06 HISTORY — DX: Anemia, unspecified: D64.9

## 2016-08-06 HISTORY — DX: Hypo-osmolality and hyponatremia: E87.1

## 2016-08-06 HISTORY — DX: Hypoxemia: R09.02

## 2016-08-06 HISTORY — DX: Unspecified hearing loss, unspecified ear: H91.90

## 2016-08-06 HISTORY — DX: Peritonitis, unspecified: K65.9

## 2016-08-06 LAB — CBC
HCT: 34.4 % — ABNORMAL LOW (ref 39.0–52.0)
HEMOGLOBIN: 11.8 g/dL — AB (ref 13.0–17.0)
MCH: 29.1 pg (ref 26.0–34.0)
MCHC: 34.3 g/dL (ref 30.0–36.0)
MCV: 84.7 fL (ref 78.0–100.0)
PLATELETS: 152 10*3/uL (ref 150–400)
RBC: 4.06 MIL/uL — AB (ref 4.22–5.81)
RDW: 18.4 % — ABNORMAL HIGH (ref 11.5–15.5)
WBC: 8.3 10*3/uL (ref 4.0–10.5)

## 2016-08-06 LAB — COMPREHENSIVE METABOLIC PANEL
ALK PHOS: 109 U/L (ref 38–126)
ALT: 25 U/L (ref 17–63)
ANION GAP: 13 (ref 5–15)
AST: 43 U/L — ABNORMAL HIGH (ref 15–41)
Albumin: 2.1 g/dL — ABNORMAL LOW (ref 3.5–5.0)
BUN: 22 mg/dL — ABNORMAL HIGH (ref 6–20)
CALCIUM: 8.7 mg/dL — AB (ref 8.9–10.3)
CO2: 26 mmol/L (ref 22–32)
Chloride: 82 mmol/L — ABNORMAL LOW (ref 101–111)
Creatinine, Ser: 4.4 mg/dL — ABNORMAL HIGH (ref 0.61–1.24)
GFR, EST AFRICAN AMERICAN: 14 mL/min — AB (ref >60)
GFR, EST NON AFRICAN AMERICAN: 12 mL/min — AB (ref >60)
Glucose, Bld: 126 mg/dL — ABNORMAL HIGH (ref 65–99)
Potassium: 3.7 mmol/L (ref 3.5–5.1)
Sodium: 121 mmol/L — ABNORMAL LOW (ref 135–145)
TOTAL PROTEIN: 6.2 g/dL — AB (ref 6.5–8.1)
Total Bilirubin: 0.3 mg/dL (ref 0.3–1.2)

## 2016-08-06 LAB — I-STAT CG4 LACTIC ACID, ED: Lactic Acid, Venous: 1.98 mmol/L — ABNORMAL HIGH (ref 0.5–1.9)

## 2016-08-06 LAB — LIPASE, BLOOD: Lipase: 17 U/L (ref 11–51)

## 2016-08-06 MED ORDER — VANCOMYCIN HCL 10 G IV SOLR
1500.0000 mg | Freq: Once | INTRAVENOUS | Status: AC
  Administered 2016-08-06: 1500 mg via INTRAVENOUS
  Filled 2016-08-06 (×2): qty 1500

## 2016-08-06 MED ORDER — VANCOMYCIN HCL 10 G IV SOLR
1500.0000 mg | Freq: Once | INTRAVENOUS | Status: DC
  Filled 2016-08-06: qty 1500

## 2016-08-06 MED ORDER — DEXTROSE 5 % IV SOLN
1.0000 g | Freq: Once | INTRAVENOUS | Status: AC
  Administered 2016-08-06: 1 g via INTRAVENOUS
  Filled 2016-08-06: qty 1

## 2016-08-06 MED ORDER — HEPARIN 1000 UNIT/ML FOR PERITONEAL DIALYSIS: 500.0000 [IU] | INTRAMUSCULAR | Status: DC | PRN

## 2016-08-06 MED ORDER — GENTAMICIN SULFATE 0.1 % EX CREA
1.0000 "application " | TOPICAL_CREAM | Freq: Every day | CUTANEOUS | Status: DC
  Administered 2016-08-08: 1 via TOPICAL
  Filled 2016-08-06 (×2): qty 15

## 2016-08-06 MED ORDER — DELFLEX-LC/1.5% DEXTROSE 344 MOSM/L IP SOLN: INTRAPERITONEAL | Status: DC

## 2016-08-06 NOTE — Progress Notes (Addendum)
Pharmacy Antibiotic Note  Darren Dunn is a 71 y.o. male with ESRD on peritoneal dialysis admitted on 08/06/2016 with peritonitis. Also concern for pneumonia. Pharmacy has been consulted for Vancomycin IV dosing. Patient was given 1 dose of Fortaz 1g IV in ED.  Last dose of peritoneal [name] was on 7/3 per Dr. Hollie Salk (Nephrology) of which was a 14 day course for Pseudomonas.    Weight - 136.5 lbs or 61.9 kg  Plan: Vancomycin 1500mg  IV x1. Will check a serum level in 3-4 days and re-dose when level <20 mg/L.  Follow-up culture results, dialysis plans, and clinical status.     Temp (24hrs), Avg:97.7 F (36.5 C), Min:96.7 F (35.9 C), Max:98.7 F (37.1 C)   Recent Labs Lab 08/06/16 1021 08/06/16 1830  WBC 8.3  --   CREATININE 4.40*  --   LATICACIDVEN  --  1.98*    CrCl cannot be calculated (Unknown ideal weight.).    Allergies  Allergen Reactions  . Sulfa Antibiotics Other (See Comments)    Bruises.    Antimicrobials this admission: Vancomycin 7/9 >> [name] 7/9 >>  Dose adjustments this admission:   Microbiology results: 7/9 BCx:   Thank you for allowing pharmacy to be a part of this patient's care.  Sloan Leiter, PharmD, BCPS Clinical Pharmacist Clinical Phone 08/06/2016 until [phone] After hours, please call #[phone] 08/06/2016 7:25 PM

## 2016-08-06 NOTE — ED Notes (Signed)
Attempted to call report
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
## 2016-08-06 NOTE — ED Notes (Signed)
Pt has PD and had cloudy dialysis fluid this am.  Pt denies abdominal pain.  Sent here to check depakote and potassium levels.  Pt has been not as alert in the past 48 hours and yesterday fell trying to fold laundry.  Pt fell and hit both arms, head, and right leg.  Pt has right leg abrasion, Right arm skin tear, and bruise to top of head.  Pt is alert to voice and appropriate.  Pt is at least a 2 person assist.

## 2016-08-06 NOTE — ED Triage Notes (Signed)
Pt. Stated, My dad has peritoneal dialysis and 3 weeks ago he had peritonitis, he probably has it again, it was cloudy the 1000 cc that was drawn off.  He fell yesterday and hit the back of head , but seemed to be ok from that.  He was seen at the dialysis center this morning and was told to come here.
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
## 2016-08-06 NOTE — Care Management (Signed)
ED CM noted patient to have had 4 ED visits 3 resulting in hospital stays. CM reviewed patient's record patient is an ESRD patient who was discharged 2 weeks ago to self care to follow up with HD with training and support for PD needs. Patient presented today with cloudy dialystate and generalized weakness, he completed a course of abx on 7/3 for peritonitis.  Patient lives alone Sherrod Toothman [phone] (son) is primary support person. Patient has been active in the past with [facility]

## 2016-08-06 NOTE — ED Notes (Signed)
Patient transported to X-ray 

## 2016-08-06 NOTE — ED Notes (Signed)
Admitting provider bedside 

## 2016-08-07 ENCOUNTER — Inpatient Hospital Stay (HOSPITAL_COMMUNITY): Payer: Medicare Other

## 2016-08-07 ENCOUNTER — Encounter (HOSPITAL_COMMUNITY): Payer: Self-pay

## 2016-08-07 DIAGNOSIS — N2589 Other disorders resulting from impaired renal tubular function: Secondary | ICD-10-CM | POA: Diagnosis not present

## 2016-08-07 DIAGNOSIS — N186 End stage renal disease: Secondary | ICD-10-CM | POA: Diagnosis not present

## 2016-08-07 DIAGNOSIS — M7989 Other specified soft tissue disorders: Secondary | ICD-10-CM

## 2016-08-07 DIAGNOSIS — R880 Cloudy (hemodialysis) (peritoneal) dialysis effluent: Secondary | ICD-10-CM | POA: Diagnosis not present

## 2016-08-07 DIAGNOSIS — D509 Iron deficiency anemia, unspecified: Secondary | ICD-10-CM | POA: Diagnosis not present

## 2016-08-07 DIAGNOSIS — E034 Atrophy of thyroid (acquired): Secondary | ICD-10-CM

## 2016-08-07 DIAGNOSIS — N2581 Secondary hyperparathyroidism of renal origin: Secondary | ICD-10-CM | POA: Diagnosis not present

## 2016-08-07 DIAGNOSIS — R600 Localized edema: Secondary | ICD-10-CM

## 2016-08-07 DIAGNOSIS — K769 Liver disease, unspecified: Secondary | ICD-10-CM | POA: Diagnosis not present

## 2016-08-07 LAB — COMPREHENSIVE METABOLIC PANEL
ALK PHOS: 84 U/L (ref 38–126)
ALT: 19 U/L (ref 17–63)
AST: 29 U/L (ref 15–41)
Albumin: 1.5 g/dL — ABNORMAL LOW (ref 3.5–5.0)
Anion gap: 11 (ref 5–15)
BILIRUBIN TOTAL: 0.7 mg/dL (ref 0.3–1.2)
BUN: 27 mg/dL — AB (ref 6–20)
CHLORIDE: 85 mmol/L — AB (ref 101–111)
CO2: 22 mmol/L (ref 22–32)
CREATININE: 5.02 mg/dL — AB (ref 0.61–1.24)
Calcium: 7.7 mg/dL — ABNORMAL LOW (ref 8.9–10.3)
GFR calc Af Amer: 12 mL/min — ABNORMAL LOW (ref >60)
GFR, EST NON AFRICAN AMERICAN: 11 mL/min — AB (ref >60)
Glucose, Bld: 92 mg/dL (ref 65–99)
Potassium: 4 mmol/L (ref 3.5–5.1)
Sodium: 118 mmol/L — CL (ref 135–145)
Total Protein: 4.4 g/dL — ABNORMAL LOW (ref 6.5–8.1)

## 2016-08-07 LAB — BODY FLUID CELL COUNT WITH DIFFERENTIAL
Eos, Fluid: 1 %
LYMPHS FL: 4 %
MONOCYTE-MACROPHAGE-SEROUS FLUID: 15 % — AB (ref 50–90)
Neutrophil Count, Fluid: 80 % — ABNORMAL HIGH (ref 0–25)
Total Nucleated Cell Count, Fluid: 0 cu mm (ref 0–1000)
Total Nucleated Cell Count, Fluid: 1893 cu mm — ABNORMAL HIGH (ref 0–1000)

## 2016-08-07 LAB — CORTISOL: CORTISOL PLASMA: 21.7 ug/dL

## 2016-08-07 LAB — CBC
HEMATOCRIT: 27 % — AB (ref 39.0–52.0)
HEMOGLOBIN: 9 g/dL — AB (ref 13.0–17.0)
MCH: 28.3 pg (ref 26.0–34.0)
MCHC: 33.3 g/dL (ref 30.0–36.0)
MCV: 84.9 fL (ref 78.0–100.0)
Platelets: 141 10*3/uL — ABNORMAL LOW (ref 150–400)
RBC: 3.18 MIL/uL — AB (ref 4.22–5.81)
RDW: 18.4 % — ABNORMAL HIGH (ref 11.5–15.5)
WBC: 6.5 10*3/uL (ref 4.0–10.5)

## 2016-08-07 LAB — TSH: TSH: 1.487 u[IU]/mL (ref 0.350–4.500)

## 2016-08-07 LAB — GLUCOSE, CAPILLARY
GLUCOSE-CAPILLARY: 146 mg/dL — AB (ref 65–99)
GLUCOSE-CAPILLARY: 146 mg/dL — AB (ref 65–99)

## 2016-08-07 LAB — OSMOLALITY: OSMOLALITY: 251 mosm/kg — AB (ref 275–295)

## 2016-08-07 MED ORDER — HEPARIN SODIUM (PORCINE) 5000 UNIT/ML IJ SOLN
5000.0000 [IU] | Freq: Three times a day (TID) | INTRAMUSCULAR | Status: DC
  Administered 2016-08-07 – 2016-08-08 (×3): 5000 [IU] via SUBCUTANEOUS
  Filled 2016-08-07 (×3): qty 1

## 2016-08-07 MED ORDER — PRO-STAT SUGAR FREE PO LIQD
30.0000 mL | Freq: Two times a day (BID) | ORAL | Status: DC
  Administered 2016-08-07 – 2016-08-12 (×7): 30 mL via ORAL
  Filled 2016-08-07 (×8): qty 30

## 2016-08-07 MED ORDER — SACCHAROMYCES BOULARDII 250 MG PO CAPS
250.0000 mg | ORAL_CAPSULE | Freq: Two times a day (BID) | ORAL | Status: DC
  Administered 2016-08-07 – 2016-08-13 (×13): 250 mg via ORAL
  Filled 2016-08-07 (×13): qty 1

## 2016-08-07 MED ORDER — RESOURCE THICKENUP CLEAR PO POWD
ORAL | Status: DC | PRN
  Filled 2016-08-07: qty 125

## 2016-08-07 MED ORDER — DIVALPROEX SODIUM 500 MG PO DR TAB: 1000.0000 mg | DELAYED_RELEASE_TABLET | Freq: Two times a day (BID) | ORAL | Status: DC

## 2016-08-07 MED ORDER — HALOPERIDOL 2 MG PO TABS
2.0000 mg | ORAL_TABLET | Freq: Every day | ORAL | Status: DC
  Administered 2016-08-07 – 2016-08-12 (×7): 2 mg via ORAL
  Filled 2016-08-07 (×8): qty 1

## 2016-08-07 MED ORDER — VITAMIN D 1000 UNITS PO TABS
2000.0000 [IU] | ORAL_TABLET | Freq: Every day | ORAL | Status: DC
  Administered 2016-08-07 – 2016-08-13 (×7): 2000 [IU] via ORAL
  Filled 2016-08-07 (×7): qty 2

## 2016-08-07 MED ORDER — NEPRO/CARBSTEADY PO LIQD
237.0000 mL | Freq: Three times a day (TID) | ORAL | Status: DC
  Administered 2016-08-08 – 2016-08-12 (×7): 237 mL via ORAL

## 2016-08-07 MED ORDER — RENA-VITE PO TABS
1.0000 | ORAL_TABLET | Freq: Every day | ORAL | Status: DC
  Administered 2016-08-07 – 2016-08-12 (×6): 1 via ORAL
  Filled 2016-08-07 (×6): qty 1

## 2016-08-07 MED ORDER — DEXTROSE 5 % IV SOLN
1.0000 g | INTRAVENOUS | Status: DC
  Administered 2016-08-07: 1 g via INTRAVENOUS
  Filled 2016-08-07 (×2): qty 1

## 2016-08-07 MED ORDER — DIVALPROEX SODIUM 500 MG PO DR TAB
500.0000 mg | DELAYED_RELEASE_TABLET | Freq: Two times a day (BID) | ORAL | Status: DC
  Administered 2016-08-07 – 2016-08-13 (×13): 500 mg via ORAL
  Filled 2016-08-07 (×13): qty 1

## 2016-08-07 MED ORDER — SODIUM CHLORIDE 0.9 % IV SOLN
INTRAVENOUS | Status: DC
  Administered 2016-08-07: 03:00:00 via INTRAVENOUS

## 2016-08-07 MED ORDER — POTASSIUM CHLORIDE CRYS ER 20 MEQ PO TBCR
40.0000 meq | EXTENDED_RELEASE_TABLET | Freq: Every day | ORAL | Status: DC
  Administered 2016-08-07 – 2016-08-09 (×3): 40 meq via ORAL
  Filled 2016-08-07 (×3): qty 2

## 2016-08-07 MED ORDER — DEXTROSE 5 % IV SOLN: 1.0000 g | INTRAVENOUS | Status: DC

## 2016-08-07 MED ORDER — DELFLEX-LC/1.5% DEXTROSE 344 MOSM/L IP SOLN: INTRAPERITONEAL | Status: DC

## 2016-08-07 MED ORDER — DEXTROSE 5 % IV SOLN
1.0000 g | INTRAVENOUS | Status: DC
  Filled 2016-08-07: qty 1

## 2016-08-07 MED ORDER — DEXTROSE 5 % IV SOLN
500.0000 mg | Freq: Every day | INTRAVENOUS | Status: DC
  Administered 2016-08-07 – 2016-08-08 (×3): 500 mg via INTRAVENOUS
  Filled 2016-08-07 (×3): qty 500

## 2016-08-07 MED ORDER — LOSARTAN POTASSIUM 50 MG PO TABS
50.0000 mg | ORAL_TABLET | Freq: Every day | ORAL | Status: DC
  Administered 2016-08-07: 50 mg via ORAL
  Filled 2016-08-07: qty 1

## 2016-08-07 MED ORDER — HEPARIN 1000 UNIT/ML FOR PERITONEAL DIALYSIS
INTRAPERITONEAL | Status: DC | PRN
  Filled 2016-08-07: qty 5000

## 2016-08-07 MED ORDER — LEVOTHYROXINE SODIUM 50 MCG PO TABS
50.0000 ug | ORAL_TABLET | Freq: Every day | ORAL | Status: DC
  Administered 2016-08-07 – 2016-08-13 (×6): 50 ug via ORAL
  Filled 2016-08-07 (×7): qty 1

## 2016-08-07 NOTE — Progress Notes (Signed)
Peritoneal Dialysis was initiated via pts LLQ PD catheter.  Upon arriving to pts room this morning there was no dressing on the PD catheter.  When I asked the pt about it he stated that he never puts a dressing on it.  I educated him regarding the risk for infection but he just kept shaking his head.  The PD catheter site is WNL with no redness, drainage, or edema noted.  I cleaned the site and placed a dressing over it per protocol.
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
## 2016-08-07 NOTE — Progress Notes (Signed)
Preliminary results by tech - Venous Duplex Lower Ext. Completed. Negative for acute deep vein thrombosis in both legs. Questionable history of prior DVT,with  chronic changes noted in the right calf veins. Oda Cogan, BS, RDMS, RVT

## 2016-08-07 NOTE — Progress Notes (Signed)
CRITICAL VALUE STICKER  CRITICAL VALUE: Na 118   RECEIVER (on-site recipient of call): Sula Soda   DATE & TIME NOTIFIED:  08/07/16   MESSENGER (representative from lab):  MD NOTIFIED: Alfredia Ferguson MD   TIME OF NOTIFICATION:0723  RESPONSE: awaiting orders

## 2016-08-07 NOTE — Progress Notes (Signed)
Paged Sheikh MD upon son's request for patient to have Haldol at bedtime per home medication regimen. In PTA medications.    Awaiting orders,    Emilio Math, RN
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
## 2016-08-07 NOTE — Progress Notes (Addendum)
Pharmacy Antibiotic Note  Darren Dunn is a 71 y.o. male with ESRD on peritoneal dialysis admitted on 08/06/2016 with peritonitis and also with concern for pneumonia. Pharmacy has been consulted for Vancomycin and cefepime IV dosing.   Patient was given 1 dose of Fortaz 1g IV in ED on 7/9. Last dose of peritoneal [name] was on 7/3 per Dr. Hollie Salk (Nephrology) of which was a 14 day course for Pseudomonas.    Plan: S/p Vancomycin 1500mg  IV x1 on 7/9 Will check a serum level in 3-4 days and re-dose when level <20 mg/L. Change cefepime to 1g IV q48h starting 7/10 at noon Azithromycin 500mg  IV q24h per MD Follow-up culture results, dialysis plans, and clinical status.     Temp (24hrs), Avg:97.7 F (36.5 C), Min:96.7 F (35.9 C), Max:98.7 F (37.1 C)   Recent Labs Lab 08/06/16 1021 08/06/16 1830 08/07/16 0516  WBC 8.3  --  6.5  CREATININE 4.40*  --  5.02*  LATICACIDVEN  --  1.98*  --     CrCl cannot be calculated (Unknown ideal weight.).    Allergies  Allergen Reactions  . Sulfa Antibiotics Other (See Comments)    Bruises.    Antimicrobials this admission: [name] 7/9 x1 Vancomycin 7/9 >> Cefepime 7/10>> Azith 7/10>>   Dose adjustments this admission: 7/10: cefepime re-entered for q48h  Microbiology results: 7/9 BCx: sent Sputum Thank you for allowing pharmacy to be a part of this patient's care.  Laraina Sulton D. Boots Mcglown, PharmD, BCPS Clinical Pharmacist Pager: [phone] Clinical Phone for 08/07/2016 until 3:30pm: [phone] If after 3:30pm, please call main pharmacy at [phone] 08/07/2016 8:24 AM

## 2016-08-07 NOTE — Evaluation (Signed)
Physical Therapy Evaluation Patient Details Name: Darren Dunn MRN: [id number] DOB: 09/26/1945 Today's Date: 08/07/2016   History of Present Illness  Darren Dunn is a 71 y.o. male with ESRD on peritoneal dialysis admitted on 08/06/2016 with peritonitis and also with concern for pneumonia.  Clinical Impression   Pt admitted with above diagnosis. Pt currently with functional limitations due to the deficits listed below (see PT Problem List). Noting Kasandra Knudsen is a little weaker than when I worked with him last in June; today requiring physical assist to power up to stand; He participates really well with PT, and as he improves medically, I anticipate he will get back to his baseline; Would like to maximize [facility] services -- anything we can do to prevent complications and another recent readmission; Will place OT consult for ADLs;  Pt will benefit from skilled PT to increase their independence and safety with mobility to allow discharge to the venue listed below.       Follow Up Recommendations Home health PT;Other (comment) (as well as OT, SW, Aide, RN)    Equipment Recommendations  None recommended by PT    Recommendations for Other Services OT consult     Precautions / Restrictions Precautions Precautions: Fall Precaution Comments: also contact prec      Mobility  Bed Mobility Overal bed mobility: Needs Assistance Bed Mobility: Supine to Sit     Supine to sit: Supervision     General bed mobility comments: incr time; supervision mostly for management of PD line  Transfers Overall transfer level: Needs assistance Equipment used: Rolling walker (2 wheeled) Transfers: Sit to/from Stand Sit to Stand: Mod assist         General transfer comment: Dependent on UE push off seated surface; light mod assist to power up from bed; cues for hand placement and safety  Ambulation/Gait Ambulation/Gait assistance: Min guard Ambulation Distance (Feet): 10 Feet (limited gait  distance due to being hooked to PD) Assistive device: Rolling walker (2 wheeled) Gait Pattern/deviations: Decreased stance time - right;Shuffle     General Gait Details: short, slightly shuffling steps with dependence on at least unilateral UE support; cues to self-monitor for activity tolerance; able to smooth out gait pattern with cues, lasts only briefly and sinks back into shuffling gait pattern quickly  Stairs            Wheelchair Mobility    Modified Rankin (Stroke Patients Only)       Balance     Sitting balance-Leahy Scale: Good       Standing balance-Leahy Scale: Fair                               Pertinent Vitals/Pain Pain Assessment: No/denies pain Faces Pain Scale: No hurt    Home Living Family/patient expects to be discharged to:: Private residence Living Arrangements: Alone Available Help at Discharge: Family;Available 24 hours/day Type of Home: House Home Access: Level entry     Home Layout: One level Home Equipment: Bedside commode;Grab bars - tub/shower;Hand held shower head;Adaptive equipment Additional Comments: This, taken from chart review from earlier hospitalization in April of this year; pt lives in a small home that his sons built for him between their 2 homes. Between the sons and daughter, someone can be with him 24/ or nearly so, per his son. They help him set up his dialysis but he makes the final connection before bed. They help him with shopping  and meal prep and he warms things up. He drives to the community center once a day and to get a cup of coffee near his home.     Prior Function Level of Independence: Needs assistance   Gait / Transfers Assistance Needed: with RW  ADL's / Homemaking Assistance Needed: Family A with IADLs. Pt performs majority of ADLs and drives  Comments: Pt speech pattern and "thick tongue" is a baseline pattern     Hand Dominance   Dominant Hand: Right    Extremity/Trunk Assessment    Upper Extremity Assessment Upper Extremity Assessment: Generalized weakness    Lower Extremity Assessment Lower Extremity Assessment: Generalized weakness    Cervical / Trunk Assessment Cervical / Trunk Assessment: Kyphotic  Communication   Communication: HOH  Cognition Arousal/Alertness: Awake/alert Behavior During Therapy: WFL for tasks assessed/performed Overall Cognitive Status: History of cognitive impairments - at baseline                                        General Comments      Exercises     Assessment/Plan    PT Assessment Patient needs continued PT services  PT Problem List Decreased strength;Decreased activity tolerance;Decreased balance;Decreased mobility;Decreased coordination;Decreased cognition;Decreased knowledge of use of DME;Decreased safety awareness;Decreased knowledge of precautions       PT Treatment Interventions DME instruction;Gait training;Functional mobility training;Therapeutic activities;Therapeutic exercise;Stair training;Balance training;Patient/family education    PT Goals (Current goals can be found in the Care Plan section)  Acute Rehab PT Goals Patient Stated Goal: to get home  PT Goal Formulation: With patient Time For Goal Achievement: 08/21/16 Potential to Achieve Goals: Good    Frequency Min 3X/week   Barriers to discharge        Co-evaluation               AM-PAC PT "6 Clicks" Daily Activity  Outcome Measure Difficulty turning over in bed (including adjusting bedclothes, sheets and blankets)?: A Lot Difficulty moving from lying on back to sitting on the side of the bed? : A Lot Difficulty sitting down on and standing up from a chair with arms (e.g., wheelchair, bedside commode, etc,.)?: Total Help needed moving to and from a bed to chair (including a wheelchair)?: A Little Help needed walking in hospital room?: A Little Help needed climbing 3-5 steps with a railing? : A Lot 6 Click Score: 13     End of Session   Activity Tolerance: Patient tolerated treatment well Patient left: in chair;with call bell/phone within reach;with chair alarm set Nurse Communication: Mobility status PT Visit Diagnosis: Unsteadiness on feet (R26.81)    Time: 2683-4196 PT Time Calculation (min) (ACUTE ONLY): 34 min   Charges:   PT Evaluation $PT Eval Moderate Complexity: 1 Procedure PT Treatments $Gait Training: 8-22 mins   PT G Codes:        Roney Marion, PT  Acute Rehabilitation Services Pager [phone] Office [phone]   [place] 08/07/2016, 2:08 PM

## 2016-08-07 NOTE — Progress Notes (Signed)
MBS completed,. Full report to follow.  Pt with moderate sensorimotor based oropharyngeal dysphagia resulting in SILENT aspiration of thin liquid and laryngeal penetration of nectar.  Decreased oral control results in premature spillage into pharynx and decreased laryngeal closure allows aspiration/penetration.  Chin tuck posture not effective and dumped pyriform residuals into open larynx. Cued throat clear/cough effective to clear mild penetrates of nectar.  Son [name] present and reports pt's study looks the same as in 2008.  Pt has not had pnas nor weight loss but is at increased risk with aspiration.  Recommend modify diet to mechanical soft (Dys3/nectar) and allow thin water between meals after oral care for maximal airway protection.   Pt and son educated using live video with reinforcement of effective strategies.  Natalie RN phoned and made aware.  SLP to follow.   Luanna Salk, [facility] SLP [phone]

## 2016-08-07 NOTE — Progress Notes (Signed)
Modified Barium Swallow Progress Note  Patient Details  Name: Darren Dunn MRN: [id number] Date of Birth: 1945/07/16  Today's Date: 08/07/2016  Modified Barium Swallow completed.  Full report located under Chart Review in the Imaging Section.  Brief recommendations include the following:  Clinical Impression  Pt with moderate sensorimotor based oropharyngeal dysphagia resulting in SILENT aspiration of thin liquid and laryngeal penetration of nectar.  Decreased oral control results in premature spillage into pharynx and decreased laryngeal closure allows aspiration/penetration.  Chin tuck posture not effective and dumped pyriform residuals into open larynx. Cued throat clear/cough effective to clear mild penetrates of nectar.  Son [name] present and reports pt's study looks the same as in 2008.  Pt has not had pnas nor weight loss but is at increased risk with aspiration.  Recommend modify diet to mechanical soft (Dys3/nectar) and allow thin water between meals after oral care for maximal airway protection.   Pt and son educated using live video with reinforcement of effective strategies.  Natalie RN phoned and made aware.  SLP to follow.   Swallow Evaluation Recommendations       SLP Diet Recommendations: Dysphagia 3 (Mech soft) solids;Nectar thick liquid- FREE WATER BETWEEN MEALS AFTER ORAL CARE   Liquid Administration via: Cup;No straw   Medication Administration: Whole meds with puree   Supervision: Full assist for feeding;Full supervision/cueing for compensatory strategies   Compensations: Slow rate;Small sips/bites (throat clear/cough after sips)   Postural Changes: Remain semi-upright after after feeds/meals (Comment);Seated upright at 90 degrees   Oral Care Recommendations: Oral care BID   Other Recommendations: Order thickener from [place], [place] [facility] [phone]
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
## 2016-08-08 ENCOUNTER — Other Ambulatory Visit (HOSPITAL_COMMUNITY): Payer: Medicare Other

## 2016-08-08 DIAGNOSIS — F3170 Bipolar disorder, currently in remission, most recent episode unspecified: Secondary | ICD-10-CM

## 2016-08-08 DIAGNOSIS — N2581 Secondary hyperparathyroidism of renal origin: Secondary | ICD-10-CM | POA: Diagnosis not present

## 2016-08-08 DIAGNOSIS — R880 Cloudy (hemodialysis) (peritoneal) dialysis effluent: Secondary | ICD-10-CM | POA: Diagnosis not present

## 2016-08-08 DIAGNOSIS — T82868A Thrombosis of vascular prosthetic devices, implants and grafts, initial encounter: Secondary | ICD-10-CM

## 2016-08-08 DIAGNOSIS — R600 Localized edema: Secondary | ICD-10-CM

## 2016-08-08 DIAGNOSIS — E871 Hypo-osmolality and hyponatremia: Secondary | ICD-10-CM

## 2016-08-08 DIAGNOSIS — D509 Iron deficiency anemia, unspecified: Secondary | ICD-10-CM | POA: Diagnosis not present

## 2016-08-08 DIAGNOSIS — N186 End stage renal disease: Secondary | ICD-10-CM | POA: Diagnosis not present

## 2016-08-08 DIAGNOSIS — N2589 Other disorders resulting from impaired renal tubular function: Secondary | ICD-10-CM | POA: Diagnosis not present

## 2016-08-08 DIAGNOSIS — K769 Liver disease, unspecified: Secondary | ICD-10-CM | POA: Diagnosis not present

## 2016-08-08 DIAGNOSIS — K659 Peritonitis, unspecified: Secondary | ICD-10-CM

## 2016-08-08 DIAGNOSIS — N185 Chronic kidney disease, stage 5: Secondary | ICD-10-CM

## 2016-08-08 LAB — PROCALCITONIN: Procalcitonin: 8.7 ng/mL

## 2016-08-08 LAB — CBC
HCT: 28.4 % — ABNORMAL LOW (ref 39.0–52.0)
Hemoglobin: 9.7 g/dL — ABNORMAL LOW (ref 13.0–17.0)
MCH: 29.2 pg (ref 26.0–34.0)
MCHC: 34.2 g/dL (ref 30.0–36.0)
MCV: 85.5 fL (ref 78.0–100.0)
PLATELETS: 140 10*3/uL — AB (ref 150–400)
RBC: 3.32 MIL/uL — ABNORMAL LOW (ref 4.22–5.81)
RDW: 18.6 % — AB (ref 11.5–15.5)
WBC: 6.4 10*3/uL (ref 4.0–10.5)

## 2016-08-08 LAB — GRAM STAIN

## 2016-08-08 LAB — PATHOLOGIST SMEAR REVIEW

## 2016-08-08 LAB — RENAL FUNCTION PANEL
Albumin: 1.6 g/dL — ABNORMAL LOW (ref 3.5–5.0)
Anion gap: 12 (ref 5–15)
BUN: 31 mg/dL — ABNORMAL HIGH (ref 6–20)
CO2: 23 mmol/L (ref 22–32)
Calcium: 7.6 mg/dL — ABNORMAL LOW (ref 8.9–10.3)
Chloride: 86 mmol/L — ABNORMAL LOW (ref 101–111)
Creatinine, Ser: 5.02 mg/dL — ABNORMAL HIGH (ref 0.61–1.24)
GFR calc Af Amer: 12 mL/min — ABNORMAL LOW (ref >60)
GFR calc non Af Amer: 11 mL/min — ABNORMAL LOW (ref >60)
Glucose, Bld: 86 mg/dL (ref 65–99)
Phosphorus: 4.2 mg/dL (ref 2.5–4.6)
Potassium: 4.3 mmol/L (ref 3.5–5.1)
Sodium: 121 mmol/L — ABNORMAL LOW (ref 135–145)

## 2016-08-08 MED ORDER — DELFLEX-LC/4.25% DEXTROSE 483 MOSM/L IP SOLN: INTRAPERITONEAL | Status: DC | PRN

## 2016-08-08 MED ORDER — DELFLEX-LC/4.25% DEXTROSE 483 MOSM/L IP SOLN: INTRAPERITONEAL | Status: DC

## 2016-08-08 MED ORDER — HEPARIN SODIUM (PORCINE) 5000 UNIT/ML IJ SOLN
5000.0000 [IU] | Freq: Three times a day (TID) | INTRAMUSCULAR | Status: DC
  Administered 2016-08-09 – 2016-08-13 (×11): 5000 [IU] via SUBCUTANEOUS
  Filled 2016-08-08 (×11): qty 1

## 2016-08-08 MED ORDER — HEPARIN 1000 UNIT/ML FOR PERITONEAL DIALYSIS
INTRAPERITONEAL | Status: DC | PRN
  Filled 2016-08-08: qty 5000

## 2016-08-08 MED ORDER — DELFLEX-LC/2.5% DEXTROSE 394 MOSM/L IP SOLN
INTRAPERITONEAL | Status: DC
  Administered 2016-08-08: 12000 mL via INTRAPERITONEAL

## 2016-08-08 NOTE — Progress Notes (Signed)
Spoke with son [name] [phone] regarding recurrent GNR in PD fluid (and that this was likely pseudomonas) and the need to remove PD catheter.  He consented to removal of the patient's PD catheter.  We next spoke of access for HD.  The patient has a clotted right upper AVGG clotted for ~ a month.  Patient's son states he declined declot in June but is agreeable now.  Have asked [facility] to see and evaluate for possible declot vs tunneled dialysis catheter placement.  I stressed to the son that the patient will need at least a 3 month holiday from PD before we can consider replacement of PD catheter.  Amalia Hailey, PA-C

## 2016-08-08 NOTE — Progress Notes (Signed)
[facility] Progress Note   Dialysis Orders: [facility] EDW 62 kg 4 exchanges 3 L fill volume dwell time 1 hr 30 min no pause no day dwell. Uses mostly 2.5%; sometimes has to use 4.25%.  Assessment/Plan: 1. Sepsis: possible pneumonia + PD peritonitis again.  getting vanc/ fortaz; also doses with Maxipime and azithromycin.  PD fluid cultures pending here as well -sent 7/10  , GNR in initial culture at outpatient dialysis 2.  PD cath related peritonitis + GNR on cultures from outpatient dialysis.. Cultures from June grew Pseudomonas putida S to [place]; [place] course on 7/3. PD fluid culture drawn at [facility] 7/9 due to cloudy effluent. IV antibiotics as above for now with transition to IP if appropriate.  BC drawn here 7/9 x 2 pending. Oddly enough the fluid he came in with (which was placed in the abdomen at the clinic 7/9 and drained 7/10 around 10 am ) was clear w/ no cells in the specimen.  However, the first drain from today's PD cycler is grossly infected, which fits better with the presenting history of cloudy fluid and abd pain.  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  Will follow.  Resent fluid 7/10 with cell count  up to 1893 3. ESRD -[facility] done during the day - 7/10 - resume this evening per routine June admission, family wantedto continue PD and did not want to transition to HD. If this is a recurrent infection, will likely have to transition to HD; Dr. Hollie Salk discussed with family 7/9. Dr. Jonnie Finner to discuss with primary nephrologist before proceeding with PD cath removal. 4. Hypokalemia: on KCl 40 mEq daily K 4.3 today 5. Anemia: hgb down to 9.7 - ? Related to ^ volume - continue to trend - not on ESA yet 6. Secondary hyperparathyroidism - . Last OP Phos 1.5 6/11. No binders/VDRA.  7HTN/volume - BP initially low off Cozaar but was resumed 7/10 - I have stopped again- because we are going to try more aggressive UF with [facility] this evening to help  with Na,   Net UF 2.3 7/10 8. Nutrition - Changed to D3 with nectar thick liquids after swallowing eval with prostat- alb now 1.6 nepro also ordered. Per son had  39. H/O bipolar/schizo disorder. On Depakote and haldol. Per primary.  10. Hyponatremia: did not improve with all 2.5s- increase this evening to have 2.5 and have 4.25   Myriam Jacobson, PA-C [place] [phone] 08/08/2016,9:39 AM  LOS: 2 days   Pt seen, examined and agree w A/P as above.  Kelly Splinter MD [facility] pager [phone]   08/08/2016, 2:20 PM    Subjective:   No c/o  Objective Vitals:   08/07/16 1100 08/07/16 1737 08/07/16 2116 08/08/16 0613  BP:  128/60 126/66 134/63  Pulse:  86 94 (!) 106  Resp:  18 18 18   Temp:  98 F (36.7 C) 97.7 F (36.5 C) (!) 97.4 F (36.3 C)  TempSrc:  Oral Oral Oral  SpO2:  100% 100% 93%  Weight:   62.6 kg (138 lb 0.1 oz)   Height: 5' 8"  (1.727 m)      Physical Exam General: NAD in recliner Heart: tachy reg Lungs: dim BS Abdomen: soft + BS mild diffuse tenderness Extremities:1+ LE edema Dialysis Access: PD cath exit clean right upper AVGG clotted   Additional Objective Labs: Basic Metabolic Panel:  Recent Labs Lab 08/06/16 1021 08/07/16 0516 08/08/16 0554  NA 121*  118* 121*  K 3.7 4.0 4.3  CL 82* 85* 86*  CO2 26 22 23   GLUCOSE 126* 92 86  BUN 22* 27* 31*  CREATININE 4.40* 5.02* 5.02*  CALCIUM 8.7* 7.7* 7.6*  PHOS  --   --  4.2   Liver Function Tests:  Recent Labs Lab 08/06/16 1021 08/07/16 0516 08/08/16 0554  AST 43* 29  --   ALT 25 19  --   ALKPHOS 109 84  --   BILITOT 0.3 0.7  --   PROT 6.2* 4.4*  --   ALBUMIN 2.1* 1.5* 1.6*    Recent Labs Lab 08/06/16 1021  LIPASE 17   CBC:  Recent Labs Lab 08/06/16 1021 08/07/16 0516 08/08/16 0743  WBC 8.3 6.5 6.4  HGB 11.8* 9.0* 9.7*  HCT 34.4* 27.0* 28.4*  MCV 84.7 84.9 85.5  PLT 152 141* 140*   Blood Culture    Component Value Date/Time    SDES FLUID PERITONEAL 08/07/2016 2226   SPECREQUEST NONE 08/07/2016 2226   CULT PENDING 08/07/2016 1030   REPTSTATUS 08/08/2016 FINAL 08/07/2016 2226    CBG:  Recent Labs Lab 08/07/16 1206 08/07/16 1614  GLUCAP 146* 146*    Lab Results  Component Value Date   INR 1.01 07/17/2016   INR 1.45 03/03/2016    Medications: . azithromycin Stopped (08/07/16 2355)  . ceFEPime (MAXIPIME) IV Stopped (08/07/16 1352)   . cholecalciferol  2,000 Units Oral Daily  . divalproex  500 mg Oral BID  . feeding supplement (NEPRO CARB STEADY)  237 mL Oral TID BM  . feeding supplement (PRO-STAT SUGAR FREE 64)  30 mL Oral BID  . gentamicin cream  1 application Topical Daily  . haloperidol  2 mg Oral QHS  . heparin  5,000 Units Subcutaneous Q8H  . levothyroxine  50 mcg Oral QAC breakfast  . losartan  50 mg Oral Daily  . multivitamin  1 tablet Oral QHS  . potassium chloride SA  40 mEq Oral Daily  . saccharomyces boulardii  250 mg Oral BID
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
## 2016-08-08 NOTE — Plan of Care (Signed)
Problem: Education: Goal: Knowledge of [facility] Education information/materials will improve Outcome: Progressing POC reviewed with pt.- pt. seem to understand some information.
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
## 2016-08-08 NOTE — Progress Notes (Signed)
SLP Cancellation Note  Patient Details Name: RIMAS GILHAM MRN: [id number] DOB: 1945/09/17   Cancelled treatment:       Reason Eval/Treat Not Completed: Fatigue/lethargy limiting ability to participate  Pt did not wake adequately for po/treatment session despite max cues - stated "ok" when asked how he was doing and quickly returned to sleep.    Luanna Salk, [facility] SLP [phone]

## 2016-08-09 ENCOUNTER — Inpatient Hospital Stay (HOSPITAL_COMMUNITY): Payer: Medicare Other | Admitting: Certified Registered Nurse Anesthetist

## 2016-08-09 ENCOUNTER — Inpatient Hospital Stay (HOSPITAL_COMMUNITY): Payer: Medicare Other

## 2016-08-09 ENCOUNTER — Other Ambulatory Visit (HOSPITAL_COMMUNITY): Payer: Medicare Other

## 2016-08-09 ENCOUNTER — Encounter (HOSPITAL_COMMUNITY): Payer: Self-pay | Admitting: General Practice

## 2016-08-09 ENCOUNTER — Encounter (HOSPITAL_COMMUNITY): Payer: Medicare Other

## 2016-08-09 ENCOUNTER — Encounter (HOSPITAL_COMMUNITY)
Admission: EM | Disposition: A | Discharge status: Home Health | Payer: Self-pay | Source: Home / Self Care | Attending: Internal Medicine

## 2016-08-09 DIAGNOSIS — N186 End stage renal disease: Secondary | ICD-10-CM

## 2016-08-09 DIAGNOSIS — N2581 Secondary hyperparathyroidism of renal origin: Secondary | ICD-10-CM | POA: Diagnosis not present

## 2016-08-09 DIAGNOSIS — R880 Cloudy (hemodialysis) (peritoneal) dialysis effluent: Secondary | ICD-10-CM | POA: Diagnosis not present

## 2016-08-09 DIAGNOSIS — N2589 Other disorders resulting from impaired renal tubular function: Secondary | ICD-10-CM | POA: Diagnosis not present

## 2016-08-09 DIAGNOSIS — Z992 Dependence on renal dialysis: Secondary | ICD-10-CM

## 2016-08-09 DIAGNOSIS — K769 Liver disease, unspecified: Secondary | ICD-10-CM | POA: Diagnosis not present

## 2016-08-09 DIAGNOSIS — D509 Iron deficiency anemia, unspecified: Secondary | ICD-10-CM | POA: Diagnosis not present

## 2016-08-09 DIAGNOSIS — J189 Pneumonia, unspecified organism: Secondary | ICD-10-CM

## 2016-08-09 DIAGNOSIS — T82898A Other specified complication of vascular prosthetic devices, implants and grafts, initial encounter: Secondary | ICD-10-CM

## 2016-08-09 HISTORY — PX: INSERTION OF DIALYSIS CATHETER: SHX1324

## 2016-08-09 HISTORY — PX: CAPD REMOVAL: SHX5234

## 2016-08-09 LAB — RENAL FUNCTION PANEL
Albumin: 1.4 g/dL — ABNORMAL LOW (ref 3.5–5.0)
Anion gap: 10 (ref 5–15)
BUN: 28 mg/dL — ABNORMAL HIGH (ref 6–20)
CO2: 24 mmol/L (ref 22–32)
Calcium: 7.7 mg/dL — ABNORMAL LOW (ref 8.9–10.3)
Chloride: 89 mmol/L — ABNORMAL LOW (ref 101–111)
Creatinine, Ser: 4.67 mg/dL — ABNORMAL HIGH (ref 0.61–1.24)
GFR calc Af Amer: 13 mL/min — ABNORMAL LOW (ref >60)
GFR calc non Af Amer: 12 mL/min — ABNORMAL LOW (ref >60)
Glucose, Bld: 110 mg/dL — ABNORMAL HIGH (ref 65–99)
Phosphorus: 3.5 mg/dL (ref 2.5–4.6)
Potassium: 3.5 mmol/L (ref 3.5–5.1)
Sodium: 123 mmol/L — ABNORMAL LOW (ref 135–145)

## 2016-08-09 LAB — CBC
HCT: 27.6 % — ABNORMAL LOW (ref 39.0–52.0)
Hemoglobin: 9.3 g/dL — ABNORMAL LOW (ref 13.0–17.0)
MCH: 29.2 pg (ref 26.0–34.0)
MCHC: 33.7 g/dL (ref 30.0–36.0)
MCV: 86.8 fL (ref 78.0–100.0)
Platelets: 127 10*3/uL — ABNORMAL LOW (ref 150–400)
RBC: 3.18 MIL/uL — ABNORMAL LOW (ref 4.22–5.81)
RDW: 18.5 % — ABNORMAL HIGH (ref 11.5–15.5)
WBC: 4.3 10*3/uL (ref 4.0–10.5)

## 2016-08-09 LAB — HEPATITIS B SURFACE ANTIGEN: Hepatitis B Surface Ag: NEGATIVE

## 2016-08-09 SURGERY — INSERTION OF DIALYSIS CATHETER
Anesthesia: General | Laterality: Right

## 2016-08-09 MED ORDER — 0.9 % SODIUM CHLORIDE (POUR BTL) OPTIME
TOPICAL | Status: DC | PRN
  Administered 2016-08-09: 1000 mL

## 2016-08-09 MED ORDER — SODIUM CHLORIDE 0.9 % IV SOLN
INTRAVENOUS | Status: DC | PRN
  Administered 2016-08-09: 500 mL

## 2016-08-09 MED ORDER — NEOSTIGMINE METHYLSULFATE 10 MG/10ML IV SOLN
INTRAVENOUS | Status: DC | PRN
  Administered 2016-08-09: 3 mg via INTRAVENOUS

## 2016-08-09 MED ORDER — PROPOFOL 10 MG/ML IV BOLUS
INTRAVENOUS | Status: DC | PRN
  Administered 2016-08-09: 100 mg via INTRAVENOUS

## 2016-08-09 MED ORDER — FENTANYL CITRATE (PF) 100 MCG/2ML IJ SOLN
INTRAMUSCULAR | Status: DC | PRN
  Administered 2016-08-09 (×2): 50 ug via INTRAVENOUS

## 2016-08-09 MED ORDER — IOPAMIDOL (ISOVUE-300) INJECTION 61%
INTRAVENOUS | Status: DC | PRN
  Administered 2016-08-09: 10 mL via INTRA_ARTERIAL

## 2016-08-09 MED ORDER — DEXTROSE 5 % IV SOLN
1.0000 g | INTRAVENOUS | Status: DC
  Filled 2016-08-09: qty 1

## 2016-08-09 MED ORDER — DEXTROSE 5 % IV SOLN
INTRAVENOUS | Status: DC | PRN
  Administered 2016-08-09: 50 ug/min via INTRAVENOUS

## 2016-08-09 MED ORDER — GLYCOPYRROLATE 0.2 MG/ML IJ SOLN
INTRAMUSCULAR | Status: DC | PRN
  Administered 2016-08-09: 0.4 mg via INTRAVENOUS

## 2016-08-09 MED ORDER — ROCURONIUM BROMIDE 100 MG/10ML IV SOLN
INTRAVENOUS | Status: DC | PRN
  Administered 2016-08-09: 30 mg via INTRAVENOUS

## 2016-08-09 MED ORDER — ONDANSETRON HCL 4 MG/2ML IJ SOLN
INTRAMUSCULAR | Status: DC | PRN
  Administered 2016-08-09: 4 mg via INTRAVENOUS

## 2016-08-09 MED ORDER — OXYCODONE HCL 5 MG PO TABS
5.0000 mg | ORAL_TABLET | ORAL | Status: DC | PRN
  Administered 2016-08-09 – 2016-08-13 (×5): 5 mg via ORAL
  Filled 2016-08-09 (×5): qty 1

## 2016-08-09 MED ORDER — HEPARIN SODIUM (PORCINE) 1000 UNIT/ML IJ SOLN
INTRAMUSCULAR | Status: DC | PRN
  Administered 2016-08-09: 3400 [IU]

## 2016-08-09 MED ORDER — SODIUM CHLORIDE 0.9 % IV SOLN
INTRAVENOUS | Status: DC
  Administered 2016-08-09 (×2): via INTRAVENOUS

## 2016-08-09 MED ORDER — FENTANYL CITRATE (PF) 250 MCG/5ML IJ SOLN
INTRAMUSCULAR | Status: AC
  Filled 2016-08-09: qty 5

## 2016-08-09 MED ORDER — PROPOFOL 10 MG/ML IV BOLUS
INTRAVENOUS | Status: AC
  Filled 2016-08-09: qty 20

## 2016-08-09 MED ORDER — SODIUM CHLORIDE 0.9 % IV SOLN
INTRAVENOUS | Status: DC | PRN
  Administered 2016-08-09 (×2): 120 ug via INTRAVENOUS

## 2016-08-09 MED ORDER — MIDAZOLAM HCL 2 MG/2ML IJ SOLN
INTRAMUSCULAR | Status: AC
  Filled 2016-08-09: qty 2

## 2016-08-09 MED ORDER — BUPIVACAINE-EPINEPHRINE (PF) 0.5% -1:200000 IJ SOLN
INTRAMUSCULAR | Status: AC
  Filled 2016-08-09: qty 30

## 2016-08-09 MED ORDER — LIDOCAINE HCL (CARDIAC) 20 MG/ML IV SOLN
INTRAVENOUS | Status: DC | PRN
  Administered 2016-08-09: 100 mg via INTRAVENOUS

## 2016-08-09 MED ORDER — HEPARIN SODIUM (PORCINE) 1000 UNIT/ML IJ SOLN
INTRAMUSCULAR | Status: AC
  Filled 2016-08-09: qty 1

## 2016-08-09 MED ORDER — IOPAMIDOL (ISOVUE-300) INJECTION 61%
INTRAVENOUS | Status: AC
  Filled 2016-08-09: qty 50

## 2016-08-09 MED ORDER — DEXTROSE 5 % IV SOLN
1.0000 g | INTRAVENOUS | Status: DC
  Administered 2016-08-09 – 2016-08-12 (×4): 1 g via INTRAVENOUS
  Filled 2016-08-09 (×5): qty 1

## 2016-08-09 MED ORDER — LIDOCAINE-EPINEPHRINE (PF) 1 %-1:200000 IJ SOLN
INTRAMUSCULAR | Status: AC
  Filled 2016-08-09: qty 30

## 2016-08-09 MED ORDER — ALBUMIN HUMAN 5 % IV SOLN
INTRAVENOUS | Status: DC | PRN
  Administered 2016-08-09: 13:00:00 via INTRAVENOUS

## 2016-08-09 SURGICAL SUPPLY — 76 items
ADH SKN CLS APL DERMABOND .7 (GAUZE/BANDAGES/DRESSINGS) ×2
BAG DECANTER FOR FLEXI CONT (MISCELLANEOUS) ×4 IMPLANT
BALLN MUSTANG 8X60X75 (BALLOONS) ×4
BALLOON MUSTANG 8X60X75 (BALLOONS) IMPLANT
BIOPATCH RED 1 DISK 7.0 (GAUZE/BANDAGES/DRESSINGS) ×3 IMPLANT
BIOPATCH RED 1IN DISK 7.0MM (GAUZE/BANDAGES/DRESSINGS) ×1
BLADE CLIPPER SURG (BLADE) ×2 IMPLANT
CANISTER SUCT 3000ML PPV (MISCELLANEOUS) ×4 IMPLANT
CATH PALINDROME RT-P 15FX19CM (CATHETERS) IMPLANT
CATH PALINDROME RT-P 15FX23CM (CATHETERS) ×2 IMPLANT
CATH PALINDROME RT-P 15FX28CM (CATHETERS) IMPLANT
CATH PALINDROME RT-P 15FX55CM (CATHETERS) IMPLANT
CATH STRAIGHT 5FR 65CM (CATHETERS) IMPLANT
CHLORAPREP W/TINT 26ML (MISCELLANEOUS) ×4 IMPLANT
COVER PROBE W GEL 5X96 (DRAPES) ×2 IMPLANT
COVER SURGICAL LIGHT HANDLE (MISCELLANEOUS) ×8 IMPLANT
DERMABOND ADVANCED (GAUZE/BANDAGES/DRESSINGS) ×2
DERMABOND ADVANCED .7 DNX12 (GAUZE/BANDAGES/DRESSINGS) ×2 IMPLANT
DRAPE C-ARM 42X72 X-RAY (DRAPES) ×2 IMPLANT
DRAPE CHEST BREAST 15X10 FENES (DRAPES) ×2 IMPLANT
DRAPE LAPAROTOMY 100X72 PEDS (DRAPES) ×4 IMPLANT
DRAPE UTILITY XL STRL (DRAPES) ×8 IMPLANT
ELECT REM PT RETURN 9FT ADLT (ELECTROSURGICAL) ×4
ELECTRODE REM PT RTRN 9FT ADLT (ELECTROSURGICAL) ×2 IMPLANT
GAUZE SPONGE 4X4 12PLY STRL LF (GAUZE/BANDAGES/DRESSINGS) ×4 IMPLANT
GAUZE SPONGE 4X4 16PLY XRAY LF (GAUZE/BANDAGES/DRESSINGS) ×4 IMPLANT
GLOVE BIO SURGEON STRL SZ7 (GLOVE) ×4 IMPLANT
GLOVE BIO SURGEON STRL SZ8 (GLOVE) ×4 IMPLANT
GLOVE BIOGEL PI IND STRL 6.5 (GLOVE) IMPLANT
GLOVE BIOGEL PI IND STRL 7.5 (GLOVE) ×2 IMPLANT
GLOVE BIOGEL PI IND STRL 8 (GLOVE) ×2 IMPLANT
GLOVE BIOGEL PI INDICATOR 6.5 (GLOVE) ×4
GLOVE BIOGEL PI INDICATOR 7.5 (GLOVE) ×2
GLOVE BIOGEL PI INDICATOR 8 (GLOVE) ×2
GOWN STRL REUS W/ TWL LRG LVL3 (GOWN DISPOSABLE) ×6 IMPLANT
GOWN STRL REUS W/ TWL XL LVL3 (GOWN DISPOSABLE) ×2 IMPLANT
GOWN STRL REUS W/TWL LRG LVL3 (GOWN DISPOSABLE) ×12
GOWN STRL REUS W/TWL XL LVL3 (GOWN DISPOSABLE) ×4
KIT BASIN OR (CUSTOM PROCEDURE TRAY) ×6 IMPLANT
KIT ENCORE 26 ADVANTAGE (KITS) ×2 IMPLANT
KIT ROOM TURNOVER OR (KITS) ×6 IMPLANT
NDL 18GX1X1/2 (RX/OR ONLY) (NEEDLE) ×2 IMPLANT
NDL HYPO 25GX1X1/2 BEV (NEEDLE) ×2 IMPLANT
NEEDLE 18GX1X1/2 (RX/OR ONLY) (NEEDLE) ×4 IMPLANT
NEEDLE 22X1 1/2 (OR ONLY) (NEEDLE) ×4 IMPLANT
NEEDLE HYPO 25GX1X1/2 BEV (NEEDLE) IMPLANT
NS IRRIG 1000ML POUR BTL (IV SOLUTION) ×8 IMPLANT
PACK GENERAL/GYN (CUSTOM PROCEDURE TRAY) ×4 IMPLANT
PACK SURGICAL SETUP 50X90 (CUSTOM PROCEDURE TRAY) ×2 IMPLANT
PAD ARMBOARD 7.5X6 YLW CONV (MISCELLANEOUS) ×12 IMPLANT
SET MICROPUNCTURE 5F STIFF (MISCELLANEOUS) IMPLANT
SHEATH BRITE TIP 8FR 23CM (MISCELLANEOUS) ×2 IMPLANT
SOAP 2 % CHG 4 OZ (WOUND CARE) ×4 IMPLANT
SUT ETHILON 3 0 PS 1 (SUTURE) ×6 IMPLANT
SUT MNCRL AB 4-0 PS2 18 (SUTURE) ×8 IMPLANT
SUT PROLENE 0 CT 1 30 (SUTURE) ×8 IMPLANT
SUT SILK 2 0 (SUTURE) ×4
SUT SILK 2-0 18XBRD TIE 12 (SUTURE) IMPLANT
SUT VIC AB 2-0 CT1 27 (SUTURE) ×4
SUT VIC AB 2-0 CT1 TAPERPNT 27 (SUTURE) ×2 IMPLANT
SUT VIC AB 3-0 SH 27 (SUTURE) ×4
SUT VIC AB 3-0 SH 27XBRD (SUTURE) ×2 IMPLANT
SUT VICRYL 0 UR6 27IN ABS (SUTURE) ×2 IMPLANT
SYR 10ML LL (SYRINGE) ×4 IMPLANT
SYR 20CC LL (SYRINGE) ×8 IMPLANT
SYR 3ML LL SCALE MARK (SYRINGE) ×4 IMPLANT
SYR 5ML LL (SYRINGE) ×4 IMPLANT
SYR CONTROL 10ML LL (SYRINGE) ×4 IMPLANT
SYRINGE 10CC LL (SYRINGE) ×2 IMPLANT
TAPE CLOTH SURG 4X10 WHT LF (GAUZE/BANDAGES/DRESSINGS) ×4 IMPLANT
TOWEL OR 17X24 6PK STRL BLUE (TOWEL DISPOSABLE) ×8 IMPLANT
TOWEL OR 17X26 10 PK STRL BLUE (TOWEL DISPOSABLE) ×4 IMPLANT
TOWEL OR 17X26 4PK STRL BLUE (TOWEL DISPOSABLE) ×4 IMPLANT
WATER STERILE IRR 1000ML POUR (IV SOLUTION) ×4 IMPLANT
WIRE AMPLATZ SS-J .035X180CM (WIRE) ×2 IMPLANT
WIRE BENTSON .035X145CM (WIRE) ×2 IMPLANT

## 2016-08-09 NOTE — Anesthesia Procedure Notes (Signed)
Procedure Name: Intubation Date/Time: 08/09/2016 12:15 PM Performed by: Clearnce Sorrel Pre-anesthesia Checklist: Patient identified, Emergency Drugs available, Suction available, Patient being monitored and Timeout performed Patient Re-evaluated:Patient Re-evaluated prior to induction Oxygen Delivery Method: Circle system utilized Preoxygenation: Pre-oxygenation with 100% oxygen Induction Type: IV induction Ventilation: Mask ventilation without difficulty, Oral airway inserted - appropriate to patient size and Two handed mask ventilation required Laryngoscope Size: Mac and 3 Grade View: Grade I Tube type: Oral Tube size: 7.5 mm Number of attempts: 1 Airway Equipment and Method: Stylet Placement Confirmation: ETT inserted through vocal cords under direct vision,  positive ETCO2 and breath sounds checked- equal and bilateral Secured at: 23 cm Tube secured with: Tape Dental Injury: Teeth and Oropharynx as per pre-operative assessment
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
## 2016-08-09 NOTE — Anesthesia Preprocedure Evaluation (Addendum)
Anesthesia Evaluation  Patient identified by MRN, date of birth, ID band Patient awake    Reviewed: Allergy & Precautions, H&P , NPO status , Patient's Chart, lab work & pertinent test results  Airway Mallampati: II  TM Distance: >3 FB Neck ROM: Full    Dental no notable dental hx.    Pulmonary neg pulmonary ROS,    Pulmonary exam normal breath sounds clear to auscultation       Cardiovascular hypertension, Normal cardiovascular exam Rhythm:Regular Rate:Normal     Neuro/Psych PSYCHIATRIC DISORDERS Bipolar Disorder negative neurological ROS     GI/Hepatic negative GI ROS, Neg liver ROS,   Endo/Other  Hypothyroidism   Renal/GU ESRF and DialysisRenal disease  negative genitourinary   Musculoskeletal   Abdominal   Peds  Hematology negative hematology ROS (+) anemia ,   Anesthesia Other Findings   Reproductive/Obstetrics negative OB ROS                           Anesthesia Physical Anesthesia Plan  ASA: IV  Anesthesia Plan: General   Post-op Pain Management:    Induction: Intravenous  PONV Risk Score and Plan: 3 and Ondansetron, Dexamethasone and Midazolam  Airway Management Planned: Oral ETT  Additional Equipment:   Intra-op Plan:   Post-operative Plan: Extubation in OR  Informed Consent: I have reviewed the patients History and Physical, chart, labs and discussed the procedure including the risks, benefits and alternatives for the proposed anesthesia with the patient or authorized representative who has indicated his/her understanding and acceptance.   Dental advisory given  Plan Discussed with: CRNA  Anesthesia Plan Comments:        Anesthesia Quick Evaluation

## 2016-08-09 NOTE — Progress Notes (Signed)
Hongalgi, MD stated that patient could resume previous diet after procedure. Orders placed. Will continue to monitor.

## 2016-08-09 NOTE — Anesthesia Postprocedure Evaluation (Signed)
Anesthesia Post Note  Patient: Azarian Starace Blish  Procedure(s) Performed: Procedure(s) (LRB): INSERTION OF DIALYSIS CATHETER RIGHT INTERNAL JUGULAR, REMOVAL HERO VENUS COMPONENT (Right) CONTINUOUS AMBULATORY PERITONEAL DIALYSIS  (CAPD) CATHETER REMOVAL (N/A)     Patient location during evaluation: PACU Anesthesia Type: General Level of consciousness: awake and alert Pain management: pain level controlled Vital Signs Assessment: post-procedure vital signs reviewed and stable Respiratory status: spontaneous breathing, nonlabored ventilation, respiratory function stable and patient connected to nasal cannula oxygen Cardiovascular status: blood pressure returned to baseline and stable Postop Assessment: no signs of nausea or vomiting Anesthetic complications: no    Last Vitals:  Vitals:   08/09/16 0541 08/09/16 1137  BP: 133/69 (!) 88/59  Pulse: 92 (!) 109  Resp: 18 17  Temp: 36.6 C (!) 36.4 C    Last Pain:  Vitals:   08/09/16 1137  TempSrc: Oral  PainSc:                  Garlen Reinig

## 2016-08-09 NOTE — Evaluation (Signed)
Occupational Therapy Evaluation Patient Details Name: Darren Dunn MRN: [id number] DOB: 1946/01/24 Today's Date: 08/09/2016    History of Present Illness Darren Dunn is a 71 y.o. male with ESRD on peritoneal dialysis admitted on 08/06/2016 with peritonitis and also with concern for pneumonia.   Clinical Impression   Pt with lethargy and fatigue. Awakened for evaluation, son present and participating. Pt has a very supportive family who assisted with IADL and insisted on being in the home when pt showered. Pt has baseline memory deficits. Pt requiring min to total assist for ADL this date with decreased activity tolerance and noted to fall asleep when not stimulated. Pt was aware of procedure today, month and place. Will continue to follow    Follow Up Recommendations  Home health OT;Supervision - Intermittent    Equipment Recommendations  None recommended by OT    Recommendations for Other Services       Precautions / Restrictions Precautions Precautions: Fall Restrictions Weight Bearing Restrictions: No      Mobility Bed Mobility Overal bed mobility: Needs Assistance Bed Mobility: Supine to Sit;Sit to Supine     Supine to sit: Min assist Sit to supine: Mod assist   General bed mobility comments: increased time, assist for hips to EOB, use of rail  Transfers Overall transfer level: Needs assistance Equipment used: 1 person hand held assist Transfers: Sit to/from Stand Sit to Stand: Mod assist         General transfer comment: stood and took several steps to [facility] with moderate assist    Balance     Sitting balance-Leahy Scale: Good       Standing balance-Leahy Scale: Poor                             ADL either performed or assessed with clinical judgement   ADL Overall ADL's : Needs assistance/impaired Eating/Feeding: NPO   Grooming: Wash/dry hands;Wash/dry face;Sitting;Minimal assistance   Upper Body Bathing: Moderate  assistance;Sitting   Lower Body Bathing: Maximal assistance;Sit to/from stand   Upper Body Dressing : Minimal assistance;Sitting   Lower Body Dressing: Maximal assistance;Sit to/from stand                 General ADL Comments: pt with lethargy, limited evaluation     Vision Baseline Vision/History: No visual deficits       Perception     Praxis      Pertinent Vitals/Pain Pain Assessment: No/denies pain     Hand Dominance Right   Extremity/Trunk Assessment Upper Extremity Assessment Upper Extremity Assessment: Generalized weakness   Lower Extremity Assessment Lower Extremity Assessment: Defer to PT evaluation   Cervical / Trunk Assessment Cervical / Trunk Assessment: Kyphotic   Communication Communication Communication: HOH   Cognition Arousal/Alertness: Lethargic Behavior During Therapy: WFL for tasks assessed/performed Overall Cognitive Status: History of cognitive impairments - at baseline                                 General Comments: per son, pt with impaired memory    General Comments       Exercises     Shoulder Instructions      Home Living Family/patient expects to be discharged to:: Private residence Living Arrangements: Alone Available Help at Discharge: Family;Available 24 hours/day Type of Home: House Home Access: Level entry     Home Layout: One level  Bathroom Shower/Tub: Occupational psychologist: Handicapped height     Home Equipment: Bedside commode;Grab bars - tub/shower;Hand held shower head;Adaptive equipment Adaptive Equipment: Reacher Additional Comments: per son      Prior Functioning/Environment Level of Independence: Needs assistance  Gait / Transfers Assistance Needed: ambulating with RW as he got weaker, typically uses cane ADL's / Homemaking Assistance Needed: family assists with 2 meals a day, pt makes his own oatmeal and performs self care modified independently   Comments: pt  with dry mouth as he is NPO, difficult to understand        OT Problem List: Decreased strength;Decreased activity tolerance;Impaired balance (sitting and/or standing);Decreased safety awareness;Decreased cognition;Decreased knowledge of use of DME or AE      OT Treatment/Interventions: Self-care/ADL training;Energy conservation;DME and/or AE instruction;Therapeutic activities;Cognitive remediation/compensation;Patient/family education    OT Goals(Current goals can be found in the care plan section) Acute Rehab OT Goals Patient Stated Goal: to get home  OT Goal Formulation: With patient Time For Goal Achievement: 08/23/16 Potential to Achieve Goals: Fair ADL Goals Pt Will Perform Grooming: standing;with min guard assist Pt Will Perform Upper Body Dressing: with supervision;sitting Pt Will Perform Lower Body Dressing: with min guard assist;sit to/from stand Pt Will Transfer to Toilet: with min guard assist;ambulating;bedside commode (over toilet) Pt Will Perform Toileting - Clothing Manipulation and hygiene: with min guard assist;sit to/from stand Pt Will Perform Tub/Shower Transfer: Shower transfer;with min guard assist;ambulating;shower seat;rolling walker;grab bars  OT Frequency: Min 2X/week   Barriers to D/C:            Co-evaluation              AM-PAC PT "6 Clicks" Daily Activity     Outcome Measure Help from another person eating meals?: Total Help from another person taking care of personal grooming?: A Little Help from another person toileting, which includes using toliet, bedpan, or urinal?: A Lot Help from another person bathing (including washing, rinsing, drying)?: A Lot Help from another person to put on and taking off regular upper body clothing?: A Lot Help from another person to put on and taking off regular lower body clothing?: Total 6 Click Score: 11   End of Session Equipment Utilized During Treatment: Gait belt;Oxygen  Activity Tolerance: Patient  limited by fatigue;Patient limited by lethargy Patient left: in bed;with call bell/phone within reach;with bed alarm set;with family/visitor present  OT Visit Diagnosis: Muscle weakness (generalized) (M62.81);Other symptoms and signs involving cognitive function;Unsteadiness on feet (R26.81);History of falling (Z91.81)                Time: 4132-4401 OT Time Calculation (min): 16 min Charges:  OT General Charges $OT Visit: 1 Procedure OT Evaluation $OT Eval Moderate Complexity: 1 Procedure G-Codes:     [name] 08/09/2016, 10:21 AM  [phone]

## 2016-08-09 NOTE — Op Note (Signed)
  OPERATIVE NOTE  PROCEDURE: 1.  Removal of venous component of HeRO graft-catheter 2.  Fibrin sheath angioplasty (8 mm x 60 mm) 3.  Central venogram 4.  Right internal jugular vein tunneled dialysis catheter placement exchange  PRE-OPERATIVE DIAGNOSIS: end-stage renal failure  POST-OPERATIVE DIAGNOSIS: same as above  SURGEON:  , MD  ANESTHESIA: general  ESTIMATED BLOOD LOSS: 30 cc  FINDING(S): 1.  Extensive fibrin sheath over venous component 2.  Successful disruption of fibrin sheath after angioplasty 3.  Tips of the catheter in the right atrium on fluoroscopy 2.  No obvious pneumothorax on fluoroscopy  SPECIMEN(S):  none  INDICATIONS:   Darren Dunn is a 71 y.o. male who presents with infected peritoneal catheter.  Patient has a right HeRO graft-catheter that has been thrombosed for one month, so the plan was to exchange the venous component for a tunneled dialysis catheter.  Dr. Thompson will plan on removing the peritoneal catheter later in this case.  The patient's family is aware the risks of this procedure include but are not limited to: bleeding, infection, central venous injury, pneumothorax, pulmonary embolism, possible venous stenosis, possible malpositioning in the venous system, and possible infections related to long-term catheter presence.  The patient's family was aware of these risks and agreed to proceed.   DESCRIPTION: After written full informed consent was obtained from the patient's family, the patient was taken back to the operating room.  Prior to induction, the patient had been given IV antibiotics as part of his treatment regimen.  After obtaining adequate sedation, the patient was prepped and draped in the standard fashion for a chest or neck tunneled dialysis catheter placement.     At this point, I made an incision over the connector joining the central venous component and the graft component of the HeRO.  I dissected out the  connector and clamped the graft and the central component.  I sharply removed the connector and then tied off the graft with a 2-0 silk tie.  Note there was no bleeding from the graft.  There was some limited serous drainage from the venous component.  I clamped the lumen of the venous component and then loaded an Amplatz wire through the lumen of the venous component.  Under fluoroscopy I placed the wire into the inferior vena cava.  The venous component was removed over the wire.  An extensive fibrin sheath was evident as I removed the venous component.  I loaded the 23 cm Palindrome catheter over the wire into the right atrium.  I removed the wire and clamped the catheter.  I transected the back end of the catheter, revealing the two lumens of this catheter.  The catheter collar was loaded over the back end of the catheter.  The two  ports were docked onto these two lumens.  The catheter collar was then snapped into place, securing the two ports.  Each port was tested by aspirating and flushing.  No blood flow could be obtained, suggesting the fibrin sheath extended into the right atrium.  I felt a fibrin sheath angioplasty was going to be necessary.  I loaded a Bentson wire through one port of the tunneled dialysis catheter.  I advanced the wire into the inferior vena cava.  The catheter was exchanged for a long 8-Fr sheath.  I loaded a 8 mm x 60 mm angioplasty balloon.  I inflated the balloon at 10 atm throughout the right atrium.  Waist was evident on inflations, consistent with   disruption of the fibrin sheath.  I removed the balloon and did a hand injection via the sheath to confirm resolution of the fibrin sheath.   I exchanged the wire for an Amplatz wire, which was placed in the inferior vena cava.  The sheath was exchanged for the 23 cm Palindrome wire.  I advanced this into the right atrium.  I removed the wire and then tested each port by aspirating and flushing each port.  No resistance was  noted.  Each port was then thoroughly flushed with heparinized saline.    I reapproximated the deep subcutaneous tissue with a double layer of 3-0 Vicryl to cover the residual graft.  The skin was reapproximated with a running stitch of 4-0 Monocryl in the skin, stopping at the border of the catheter.  A 3-0 Nylon stitch was tied to the tunneled dialysis catheter to anchor the catheter.  Each port was then loaded with concentrated heparin (1000 Units/mL) at the manufacturer recommended volumes to each port.  Sterile caps were applied to each port.    On completion fluoroscopy, the tips of the catheter were in the right atrium, and there was no evidence of pneumothorax.  At this point, Dr. Grandville Silos scrubbed in to complete the removed of the peritoneal dialysis catheter.   COMPLICATIONS: none  CONDITION: stable   Adele Barthel, MD, [facility] [place] Office: [phone] Pager: [phone]  08/09/2016, 1:18 PM

## 2016-08-09 NOTE — Interval H&P Note (Signed)
   History and Physical Update  The patient was interviewed and re-examined.  The patient's previous History and Physical has been reviewed and is unchanged from Dr. Nona Dell consult.  Plan is to removed the venous component of the HeRO graft-cath and try to exchange it for a tunneled dialysis catheter.     The patient is aware the risks of tunneled dialysis catheter placement include but are not limited to: bleeding, infection, central venous injury, pneumothorax, possible venous stenosis, possible malpositioning in the venous system, and possible infections related to long-term catheter presence.   Additionally as the HeRO graft-catheter has been thrombosed for over one month, the patient has an increased risk of pulmonary embolism due to exchange of the venous component over wire and possible dislodgment of thrombus in venous component.    Removing the venous component without intraluminal wire access can result in loss of venous access.  The patient was aware of these risks and agreed to proceed.   Adele Barthel, MD, FACS [facility] [place] Office: [phone] Pager: [phone]  08/09/2016, 11:53 AM
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
## 2016-08-09 NOTE — Progress Notes (Signed)
Pharmacy Antibiotic Note  Darren Dunn is a 71 y.o. male with ESRD on peritoneal dialysis admitted on 08/06/2016 with peritonitis and also with concern for pneumonia. Pharmacy has been consulted for Vancomycin and cefepime IV dosing.   Patient had PD catheter removed today and had TDC placed.  Outpatient peritoneal cultures grew citerobacter, resistant to cefazolin only (in shadow chart)   Plan: S/p Vancomycin 1500mg  IV x1 on 7/9- Will check a serum level tonight at 2100 (72 after dose) and re-dose if level <20 mg/L. Change cefepime to 1g IV q24h starting tonight at 1800 in anticipation of HD Follow-up culture results, dialysis plans/schedule/tolerance, and clinical status.   Height: 5\' 8"  (172.7 cm) Weight: 144 lb (65.3 kg) IBW/kg (Calculated) : 68.4  Temp (24hrs), Avg:97.7 F (36.5 C), Min:97.2 F (36.2 C), Max:98 F (36.7 C)   Recent Labs Lab 08/06/16 1021 08/06/16 1830 08/07/16 0516 08/08/16 0554 08/08/16 0743 08/09/16 0515  WBC 8.3  --  6.5  --  6.4 4.3  CREATININE 4.40*  --  5.02* 5.02*  --  4.67*  LATICACIDVEN  --  1.98*  --   --   --   --     Estimated Creatinine Clearance: 13.6 mL/min (A) (by C-G formula based on SCr of 4.67 mg/dL (H)).    Allergies  Allergen Reactions  . Sulfa Antibiotics Other (See Comments)    Bruises.    Antimicrobials this admission: [name] 7/9 x1 Vancomycin 7/9 >> *1500mg  given IV 7/9 Cefepime 7/10>> Azith 7/10>>7/12  Dose adjustments this admission: 7/10: cefepime re-entered for q48h 7/12: cefepime changed to q24h in anticipation of HD starting  Microbiology results: 7/10 peritoneal fluid: ngtd, nothing on gram stain 7/10 fluid chem: 1893 WBC, 4 lymphs, 80 macrophils, clear/colorless 7/9BCx: ngtd Output cx: citerobacter, R to Ancef only [6/21 peritoneal fluid: pseudomonas]   Montae Stager D. Laporche Martelle, PharmD, BCPS Clinical Pharmacist Pager: [phone] Clinical Phone for 08/09/2016 until 3:30pm: [phone] If after 3:30pm, please  call main pharmacy at [phone] 08/09/2016 3:58 PM

## 2016-08-09 NOTE — Progress Notes (Signed)
Physical Therapy Treatment Patient Details Name: Darren Dunn MRN: [id number] DOB: 10-27-1945 Today's Date: 08/09/2016    History of Present Illness Darren Dunn is a 71 y.o. male with ESRD on peritoneal dialysis admitted on 08/06/2016 with peritonitis and also with concern for pneumonia.    PT Comments    Pt demonstrates improved tolerance for gait distance this session with RW. Improved transfers noted as well with the ability to stand from 3n1 and edge of bed. Pt was assisted to the bathroom as he was unsuccessful with BM on 3n1 and Nursing student present to assist with transitioning from commode back to bed. Pt continues to benefit from acute services to address the continued deficits prior to discharge.    Follow Up Recommendations  Home health PT     Equipment Recommendations  None recommended by PT    Recommendations for Other Services OT consult     Precautions / Restrictions Precautions Precautions: Fall Precaution Comments: also contact prec Restrictions Weight Bearing Restrictions: No    Mobility  Bed Mobility Overal bed mobility: Needs Assistance Bed Mobility: Supine to Sit     Supine to sit: Min assist     General bed mobility comments: increased time, assist for hips to EOB, use of rail  Transfers Overall transfer level: Needs assistance Equipment used: Rolling walker (2 wheeled) Transfers: Sit to/from [facility] Sit to Stand: Mod assist Stand pivot transfers: Mod assist       General transfer comment: Mod A to stand from EOB. Mod A to pivot transfer from bed to [facility]   Ambulation/Gait Ambulation/Gait assistance: Min guard Ambulation Distance (Feet): 50 Feet Assistive device: Rolling walker (2 wheeled) Gait Pattern/deviations: Decreased stance time - right;Shuffle Gait velocity: decreased Gait velocity interpretation: Below normal speed for age/gender General Gait Details: short step length and no heel strike RLE. pt  stops to attempt to correct, but only corrects for two step despite verbal cues.    Stairs            Wheelchair Mobility    Modified Rankin (Stroke Patients Only)       Balance Overall balance assessment: Needs assistance Sitting-balance support: No upper extremity supported;Feet supported Sitting balance-Leahy Scale: Good     Standing balance support: Bilateral upper extremity supported;No upper extremity supported Standing balance-Leahy Scale: Poor Standing balance comment: reliant on RW for stability in standing                            Cognition Arousal/Alertness: Lethargic Behavior During Therapy: WFL for tasks assessed/performed Overall Cognitive Status: History of cognitive impairments - at baseline                                 General Comments: per son, pt with impaired memory       Exercises      General Comments General comments (skin integrity, edema, etc.): Pt was assisted to bathroom this session as he was unable to successfully have a BM on larger 3N1. Assisted pt to bathroom, nurse student present to assist off commode.       Pertinent Vitals/Pain Pain Assessment: No/denies pain    Home Living                      Prior Function            PT Goals (  current goals can now be found in the care plan section) Acute Rehab PT Goals Patient Stated Goal: to get home  Progress towards PT goals: Progressing toward goals    Frequency    Min 3X/week      PT Plan Current plan remains appropriate    Co-evaluation              AM-PAC PT "6 Clicks" Daily Activity  Outcome Measure  Difficulty turning over in bed (including adjusting bedclothes, sheets and blankets)?: A Lot Difficulty moving from lying on back to sitting on the side of the bed? : A Lot Difficulty sitting down on and standing up from a chair with arms (e.g., wheelchair, bedside commode, etc,.)?: Total Help needed moving to and from a  bed to chair (including a wheelchair)?: A Little Help needed walking in hospital room?: A Little Help needed climbing 3-5 steps with a railing? : A Lot 6 Click Score: 13    End of Session Equipment Utilized During Treatment: Gait belt Activity Tolerance: Patient tolerated treatment well Patient left: with nursing/sitter in room;Other (comment) (on commode) Nurse Communication: Mobility status PT Visit Diagnosis: Unsteadiness on feet (R26.81)     Time: 8295-6213 PT Time Calculation (min) (ACUTE ONLY): 27 min  Charges:  $Gait Training: 8-22 mins $Therapeutic Activity: 8-22 mins                    G Codes:       Scheryl Marten PT, DPT  [phone]    Jacqulyn Liner Sloan Leiter 08/09/2016, 2:13 PM

## 2016-08-09 NOTE — Progress Notes (Signed)
[facility] Progress Note  Dialysis Orders: CCPD EDW 62 kg 4 exchanges 3 L fill volume dwell time 1 hr 30 min no pause no day dwell. Uses mostly 2.5%; sometimes has to use 4.25%. CHANGING TO HD 7/13  Assessment/Plan: 1. Sepsis: recurrent perontinits - cultures + for ctirobacter freundii (7/9 outpatient -results in shadow chart) sensitive to cefepime,  ceftazidime and ceftriaxone; question of possible PNA - This is is second GNR perotinitis (prev was psuedomonas- fully treated by PD cath not removed).  Plan for cath removal and transition for at least short term HD; discussed with son [name] yesterday who is agreeable. Still on Vanc as well for possible PNA  3. ESRD -to transition to HD -since he has a HERO it cannot be declotted at this late date - venous component to be removed and [facility] cath placed today by VVS.  Appreciate both VVS and [facility] help with surgeries on short notice.  Needs to be clipped to outpatient dialysis.  As he lives in [place], this may be a logistical challenge for the family. Plan HD Friday 4. Hypokalemia: on KCl 40 mEq daily K 3.5 - plan 4 K HD Friday 5. Anemia: hgb down to 9.7 - ? Related to ^ volume - continue to trend - not on ESA yet 6. Secondary hyperparathyroidism - . Last OP Phos 1.5 6/11. No binders/VDRA.  7HTN/volume - BP initially low offCozaar but was resumed 7/10 - have stopped again. LE edema persists, still up 2-3 over dry wt.  CXR 7/9 was clear. Let BP's run high while we get this extra vol off.  8. Nutrition - Changed to D3 with nectar thick liquids after swallowing eval with prostat- alb now 1.4 nepro also ordered. NPO for surgery today; expect to improve on HD 9. H/O bipolar/schizo disorder. On Depakote and haldol. Per primary.  10. Hyponatremia: slowly improving with UF  Myriam Jacobson, PA-C [place] [phone] 08/09/2016,10:22 AM  LOS: 3 days    Pt seen, examined and agree w A/P as above Kelly Splinter  MD [facility] pager [phone]   08/09/2016, 1:36 PM     Subjective:   No c/o  Objective Vitals:   08/08/16 2130 08/08/16 2155 08/09/16 0500 08/09/16 0541  BP: (!) 167/87   133/69  Pulse: (!) 103   92  Resp: 20   18  Temp: 98 F (36.7 C)   97.9 F (36.6 C)  TempSrc: Oral   Oral  SpO2: 97%   93%  Weight:  65.7 kg (144 lb 13.5 oz) 65.7 kg (144 lb 13.5 oz)   Height:       Physical Exam General: NAD rouses easily from sleep Heart: RRR Lungs: no rales Abdomen: generalized tenderness Extremities: 1+ pitting edema Dialysis Access: clotted HERO graft and PD cath   Additional Objective Labs: Basic Metabolic Panel:  Recent Labs Lab 08/07/16 0516 08/08/16 0554 08/09/16 0515  NA 118* 121* 123*  K 4.0 4.3 3.5  CL 85* 86* 89*  CO2 22 23 24   GLUCOSE 92 86 110*  BUN 27* 31* 28*  CREATININE 5.02* 5.02* 4.67*  CALCIUM 7.7* 7.6* 7.7*  PHOS  --  4.2 3.5   Liver Function Tests:  Recent Labs Lab 08/06/16 1021 08/07/16 0516 08/08/16 0554 08/09/16 0515  AST 43* 29  --   --   ALT 25 19  --   --   ALKPHOS 109 84  --   --   BILITOT 0.3 0.7  --   --  PROT 6.2* 4.4*  --   --   ALBUMIN 2.1* 1.5* 1.6* 1.4*    Recent Labs Lab 08/06/16 1021  LIPASE 17   CBC:  Recent Labs Lab 08/06/16 1021 08/07/16 0516 08/08/16 0743 08/09/16 0515  WBC 8.3 6.5 6.4 4.3  HGB 11.8* 9.0* 9.7* 9.3*  HCT 34.4* 27.0* 28.4* 27.6*  MCV 84.7 84.9 85.5 86.8  PLT 152 141* 140* 127*   Blood Culture    Component Value Date/Time   SDES FLUID PERITONEAL 08/07/2016 2226   SDES FLUID PERITONEAL 08/07/2016 2226   SPECREQUEST BOTTLES DRAWN AEROBIC AND ANAEROBIC 08/07/2016 2226   SPECREQUEST NONE 08/07/2016 2226   CULT NO GROWTH < 12 HOURS 08/07/2016 2226   REPTSTATUS PENDING 08/07/2016 2226   REPTSTATUS 08/08/2016 FINAL 08/07/2016 2226    Cardiac Enzymes: No results for input(s): CKTOTAL, CKMB, CKMBINDEX, TROPONINI in the last 168 hours. CBG:  Recent Labs Lab  08/07/16 1206 08/07/16 1614  GLUCAP 146* 146*   Iron Studies: No results for input(s): IRON, TIBC, TRANSFERRIN, FERRITIN in the last 72 hours. Lab Results  Component Value Date   INR 1.01 07/17/2016   INR 1.45 03/03/2016   Studies/Results: No results found. Medications: . ceFEPime (MAXIPIME) IV Stopped (08/07/16 1352)  . dialysis solution 2.5% low-MG/low-CA    . dialysis solution 4.25% low-MG/low-CA     . cholecalciferol  2,000 Units Oral Daily  . divalproex  500 mg Oral BID  . feeding supplement (NEPRO CARB STEADY)  237 mL Oral TID BM  . feeding supplement (PRO-STAT SUGAR FREE 64)  30 mL Oral BID  . gentamicin cream  1 application Topical Daily  . haloperidol  2 mg Oral QHS  . heparin  5,000 Units Subcutaneous Q8H  . levothyroxine  50 mcg Oral QAC breakfast  . multivitamin  1 tablet Oral QHS  . potassium chloride SA  40 mEq Oral Daily  . saccharomyces boulardii  250 mg Oral BID

## 2016-08-09 NOTE — Progress Notes (Signed)
SLP Cancellation Note  Patient Details Name: Darren Dunn MRN: [id number] DOB: [birth year]   Cancelled treatment:       Reason Eval/Treat Not Completed: Medical issues which prohibited therapy (pt npo for procedure, will continue to follow)   Claudie Fisherman, [place] [facility] SLP [phone]

## 2016-08-09 NOTE — Progress Notes (Signed)
OR stated that it was ok to give patients AM meds. Will continue to monitor.

## 2016-08-09 NOTE — Progress Notes (Addendum)
PROGRESS NOTE   Darren Dunn  HYQ:[id number]    DOB: 11-04-1945    DOA: 08/06/2016  PCP: Mechele Claude, MD   I have briefly reviewed patients previous medical records in [facility].  Brief Narrative:  71 year old male with PMH of ESRD on PD, bipolar disorder, recently discharged from [facility] 6/19 when he had PD peritonitis and cultures grew Pseudomonas, completed 2 weeks of intraperitoneal Fortaz on 7/3, presented with 1.5 days of lethargy, weakness, fall and multiple skin tears to bilateral upper extremities and an abrasion on the back of the head, hypothermia and cloudy PD fluid. Admitted for suspected recurrent peritonitis secondary to PD catheter. Nephrology consulting and recommend removing PD catheter which will be done by surgery in oral or. Vascular surgery consulted for possible declotting of right upper AV graft versus placement of tunneled dialysis catheter.   Assessment & Plan:   Principal Problem:   Hyponatremia Active Problems:   Anemia   Bipolar disorder (HCC)   CKD (chronic kidney disease) stage V requiring chronic dialysis (HCC)   Hypothyroidism   HTN (hypertension)   Bacterial peritonitis (HCC)   Peritonitis (HCC)   Hypoxia   Bilateral leg edema   1. Sepsis: Suspected due to possible pneumonia and recurrent PD peritonitis. Blood cultures 2: Negative to date. PD fluid culture here is negative to date. However as per nephrology, Citrobacter Freundii on outpatient culture 7/9 resistant only to Ancef. Continue cefepime. Pro-calcitonin 8.7. 2. PD catheter related recurrent peritonitis: Previously grew Pseudomonas and now Citrobacter. Continue IV cefepime. CAPD catheter removed by general surgery in oral or and TDC inserted by vascular surgery on 7/12. 3. Presumed pneumonia: Not convincing by symptoms. Chest x-ray repeat suggests atelectasis. Blood cultures 2 negative to date. Discontinue vancomycin and azithromycin. Remains on cefepime for peritonitis. 4. ESRD:  Plans to start hemodialysis on 7/13 as per nephrology follow-up. Needs to be clipped to outpatient dialysis. 5. Hypokalemia: Replaced. 6. Anemia: Stable in the 9 range over the last 3 days. 7. Essential hypertension: Controlled. Cozaar held by nephrology who are planning aggressive volume management across dialysis. 8. History of bipolar disorder: On Depakote and Haldol. 9. Hyponatremia: Per nephrology. Improved from 118-121-123. 10. Acute respiratory failure with hypoxia: Presented with oxygen saturation of 86%. May be related to pneumonia but more likely from volume overload. Treat underlying cause. Oxygen supplements as needed. 11. Hypothyroid: TSH 1.487. Continue levothyroxine. 12. Bilateral lower extremity edema: Due to ESRD and hypoalbuminemia. Lower extremity venous Dopplers negative for DVT. Diet management per dietitian consultation. Volume management across dialysis. 13. Dysphagia: On dysphagia 3 diet and nectar thickened liquid per speech therapy recommendations. 14. Thrombocytopenia: Unclear etiology.? Related to antibiotics or infection. Relatively stable. No bleeding. Follow CBCs closely.   DVT prophylaxis: Heparin Code Status: Full Family Communication: Discussed in detail with patient's son at bedside. Disposition: Not stable for discharge   Consultants:  Nephrology General surgery Vascular surgery   Procedures:  Peritoneal dialysis  CAPD catheter removed by CCS in all OR on 7/12. Removal of venous component of HeRO graft catheter, right IJ TDC placement by vascular surgery on 7/12   Antimicrobials:  IV cefepime Azithromycin-discontinued Vancomycin-discontinued    Subjective: States that he feels "fine". Denies dyspnea. Mild intermittent dry cough. Denies coughing while drinking or eating but son reports that he occasionally has this. Patient was seen prior to procedures today.  ROS: Denies chest pain, dyspnea or abdominal pain.   Objective:  Vitals:    08/09/16 1415 08/09/16 1430 08/09/16 1439  08/09/16 1510  BP: 112/66 106/62  125/66  Pulse: 80 81  80  Resp: 13 12    Temp:   (!) 97.3 F (36.3 C)   TempSrc:      SpO2: 100% 94%  95%  Weight:      Height:        Examination:  General exam: Pleasant elderly male lying comfortably propped up in bed.  Respiratory system: Slightly diminished breath sounds in the bases but otherwise clear to auscultation without wheezing, rhonchi or crackles. Cardiovascular system: S1 and S2 heard, RRR. No JVD, murmurs. Telemetry: Sinus rhythm. Gastrointestinal system: Abdomen is nondistended, soft and nontender. No organomegaly or masses felt. Normal bowel sounds heard.PD catheter +  Central nervous system: Alert and oriented 2. No focal neurological deficits. Extremities: Symmetric 5 x 5 power. Skin: No rashes, lesions or ulcers Psychiatry: Judgement and insight impaired. Mood & affect appropriate.     Data Reviewed: I have personally reviewed following labs and imaging studies  CBC:  Recent Labs Lab 08/06/16 1021 08/07/16 0516 08/08/16 0743 08/09/16 0515  WBC 8.3 6.5 6.4 4.3  HGB 11.8* 9.0* 9.7* 9.3*  HCT 34.4* 27.0* 28.4* 27.6*  MCV 84.7 84.9 85.5 86.8  PLT 152 141* 140* 127*   Basic Metabolic Panel:  Recent Labs Lab 08/06/16 1021 08/07/16 0516 08/08/16 0554 08/09/16 0515  NA 121* 118* 121* 123*  K 3.7 4.0 4.3 3.5  CL 82* 85* 86* 89*  CO2 26 22 23 24   GLUCOSE 126* 92 86 110*  BUN 22* 27* 31* 28*  CREATININE 4.40* 5.02* 5.02* 4.67*  CALCIUM 8.7* 7.7* 7.6* 7.7*  PHOS  --   --  4.2 3.5   Liver Function Tests:  Recent Labs Lab 08/06/16 1021 08/07/16 0516 08/08/16 0554 08/09/16 0515  AST 43* 29  --   --   ALT 25 19  --   --   ALKPHOS 109 84  --   --   BILITOT 0.3 0.7  --   --   PROT 6.2* 4.4*  --   --   ALBUMIN 2.1* 1.5* 1.6* 1.4*   CBG:  Recent Labs Lab 08/07/16 1206 08/07/16 1614  GLUCAP 146* 146*    Recent Results (from the past 240 hour(s))  Culture,  blood (routine x 2)     Status: None (Preliminary result)   Collection Time: 08/06/16  6:15 PM  Result Value Ref Range Status   Specimen Description BLOOD LEFT ARM  Final   Special Requests   Final    BOTTLES DRAWN AEROBIC ONLY Blood Culture adequate volume   Culture NO GROWTH 3 DAYS  Final   Report Status PENDING  Incomplete  Culture, blood (routine x 2)     Status: None (Preliminary result)   Collection Time: 08/06/16  6:20 PM  Result Value Ref Range Status   Specimen Description BLOOD LEFT HAND  Final   Special Requests   Final    BOTTLES DRAWN AEROBIC ONLY Blood Culture adequate volume   Culture NO GROWTH 3 DAYS  Final   Report Status PENDING  Incomplete  Body fluid culture     Status: None (Preliminary result)   Collection Time: 08/07/16 10:30 AM  Result Value Ref Range Status   Specimen Description PERITONEAL  Final   Special Requests Immunocompromised  Final   Gram Stain NO WBC SEEN NO ORGANISMS SEEN CYTOSPIN SMEAR   Final   Culture NO GROWTH 2 DAYS  Final   Report Status PENDING  Incomplete  Culture, body fluid-bottle     Status: None (Preliminary result)   Collection Time: 08/07/16 10:26 PM  Result Value Ref Range Status   Specimen Description FLUID PERITONEAL  Final   Special Requests BOTTLES DRAWN AEROBIC AND ANAEROBIC  Final   Culture NO GROWTH 2 DAYS  Final   Report Status PENDING  Incomplete  Gram stain     Status: None   Collection Time: 08/07/16 10:26 PM  Result Value Ref Range Status   Specimen Description FLUID PERITONEAL  Final   Special Requests NONE  Final   Gram Stain CYTOSPIN SMEAR RARE WBC SEEN NO ORGANISMS SEEN   Final   Report Status 08/08/2016 FINAL  Final         Radiology Studies: Dg Chest Port 1 View  Result Date: 08/09/2016 CLINICAL DATA:  71 y/o  M; right catheter insertion. EXAM: PORTABLE CHEST 1 VIEW COMPARISON:  08/06/2016 chest radiograph. FINDINGS: Stable cardiac silhouette. Aortic atherosclerosis with calcification. Exchange of  right central venous catheter with tip projecting over the cavoatrial junction. Right axillary vascular stent. Retained contrast within visualized bowel. Stable small left pleural effusion. Left basilar opacity is likely associated atelectasis. IMPRESSION: Right central venous catheter with tip projecting over cavoatrial junction. Stable small left pleural effusion. Electronically Signed   By: Mitzi Hansen M.D.   On: 08/09/2016 14:27        Scheduled Meds: . cholecalciferol  2,000 Units Oral Daily  . divalproex  500 mg Oral BID  . feeding supplement (NEPRO CARB STEADY)  237 mL Oral TID BM  . feeding supplement (PRO-STAT SUGAR FREE 64)  30 mL Oral BID  . haloperidol  2 mg Oral QHS  . heparin  5,000 Units Subcutaneous Q8H  . levothyroxine  50 mcg Oral QAC breakfast  . multivitamin  1 tablet Oral QHS  . potassium chloride SA  40 mEq Oral Daily  . saccharomyces boulardii  250 mg Oral BID   Continuous Infusions: . sodium chloride 10 mL/hr at 08/09/16 1204  . ceFEPime (MAXIPIME) IV       LOS: 3 days     Madelaine Whipple, MD, FACP, FHM. [facility] Pager [phone]  If 7PM-7AM, please contact night-coverage [email or web address] Password [facility] 08/09/2016, 4:35 PM

## 2016-08-09 NOTE — Transfer of Care (Signed)
Immediate Anesthesia Transfer of Care Note  Patient: Darren Dunn  Procedure(s) Performed: Procedure(s): INSERTION OF DIALYSIS CATHETER RIGHT INTERNAL JUGULAR, REMOVAL HERO VENUS COMPONENT (Right) CONTINUOUS AMBULATORY PERITONEAL DIALYSIS  (CAPD) CATHETER REMOVAL (N/A)  Patient Location: PACU  Anesthesia Type:General  Level of Consciousness: awake  Airway & Oxygen Therapy: Patient Spontanous Breathing and Patient connected to nasal cannula oxygen  Post-op Assessment: Report given to RN and Post -op Vital signs reviewed and stable  Post vital signs: Reviewed and stable  Last Vitals:  Vitals:   08/09/16 0541 08/09/16 1137  BP: 133/69 (!) 88/59  Pulse: 92 (!) 109  Resp: 18 17  Temp: 36.6 C (!) 36.4 C    Last Pain:  Vitals:   08/09/16 1137  TempSrc: Oral  PainSc:          Complications: No apparent anesthesia complications

## 2016-08-09 NOTE — Op Note (Signed)
08/06/2016 - 08/09/2016  1:46 PM  PATIENT:  Darren Dunn  71 y.o. male  PRE-OPERATIVE DIAGNOSIS:  CAPD associated peritonitis  POST-OPERATIVE DIAGNOSIS:  CAPD associated peritonitis   PROCEDURE:  Procedure(s): CONTINUOUS AMBULATORY PERITONEAL DIALYSIS  (CAPD) CATHETER REMOVAL  SURGEON:  Georganna Skeans, M.D.  ASSISTANTS: none   ANESTHESIA:   general  EBL:  Total I/O In: 71959 [DIXVE:55015; IV Piggyback:250] Out: 86825 [[id number]]  BLOOD ADMINISTERED:none  DRAINS: none   SPECIMEN:  Excision  DISPOSITION OF SPECIMEN:  PATHOLOGY  COUNTS:  YES  DICTATION: .Dragon Dictation Procedure in detail: Informed consent was obtained from his son. Darren Dunn was in the operating room with Dr. Bridgett Larsson completing his portion of the procedure. When he was finished, his abdomen was prepped and draped in a sterile fashion. He remained under general anesthesia. We did a time out procedure. Next, I made an incision over the fascial entry point of the CAPD catheter. Subcutaneous tissues were dissected down through a lot of scar tissue and it was freed up. The catheter was removed from the peritoneal cavity in its entirety. Next further dissection around the tube subcutaneous cuffs on the catheter was performed. The catheter was very adherent to the skin at the exit site and I had to remove a small length of that. The catheter was removed in its entirety. I closed the small hole in the fascia at the entry point with 0 Vicryl. Both wounds were irrigated, hemostasis was obtained. Both wounds were closed with running 3-0 nylon. Sterile dressings were applied. He tolerated procedure well without apparent complications and was taken recovery in stable condition. PATIENT DISPOSITION:  PACU - hemodynamically stable.   Delay start of Pharmacological VTE agent (>24hrs) due to surgical blood loss or risk of bleeding:  no  Georganna Skeans, MD, MPH, FACS Pager: [phone]  7/12/20181:46  PM

## 2016-08-10 ENCOUNTER — Encounter (HOSPITAL_COMMUNITY): Payer: Self-pay | Admitting: Vascular Surgery

## 2016-08-10 ENCOUNTER — Inpatient Hospital Stay (HOSPITAL_COMMUNITY): Payer: Medicare Other

## 2016-08-10 DIAGNOSIS — N186 End stage renal disease: Secondary | ICD-10-CM

## 2016-08-10 DIAGNOSIS — D509 Iron deficiency anemia, unspecified: Secondary | ICD-10-CM | POA: Diagnosis not present

## 2016-08-10 DIAGNOSIS — I340 Nonrheumatic mitral (valve) insufficiency: Secondary | ICD-10-CM

## 2016-08-10 DIAGNOSIS — R880 Cloudy (hemodialysis) (peritoneal) dialysis effluent: Secondary | ICD-10-CM | POA: Diagnosis not present

## 2016-08-10 DIAGNOSIS — N2589 Other disorders resulting from impaired renal tubular function: Secondary | ICD-10-CM | POA: Diagnosis not present

## 2016-08-10 DIAGNOSIS — K769 Liver disease, unspecified: Secondary | ICD-10-CM | POA: Diagnosis not present

## 2016-08-10 DIAGNOSIS — N2581 Secondary hyperparathyroidism of renal origin: Secondary | ICD-10-CM | POA: Diagnosis not present

## 2016-08-10 LAB — BODY FLUID CULTURE
CULTURE: NO GROWTH
GRAM STAIN: NONE SEEN

## 2016-08-10 LAB — CBC
HCT: 28.5 % — ABNORMAL LOW (ref 39.0–52.0)
Hemoglobin: 9.4 g/dL — ABNORMAL LOW (ref 13.0–17.0)
MCH: 29.2 pg (ref 26.0–34.0)
MCHC: 33 g/dL (ref 30.0–36.0)
MCV: 88.5 fL (ref 78.0–100.0)
Platelets: 130 10*3/uL — ABNORMAL LOW (ref 150–400)
RBC: 3.22 MIL/uL — ABNORMAL LOW (ref 4.22–5.81)
RDW: 18.7 % — ABNORMAL HIGH (ref 11.5–15.5)
WBC: 5.7 10*3/uL (ref 4.0–10.5)

## 2016-08-10 LAB — RENAL FUNCTION PANEL
Albumin: 1.6 g/dL — ABNORMAL LOW (ref 3.5–5.0)
Anion gap: 10 (ref 5–15)
BUN: 38 mg/dL — ABNORMAL HIGH (ref 6–20)
CO2: 24 mmol/L (ref 22–32)
Calcium: 7.6 mg/dL — ABNORMAL LOW (ref 8.9–10.3)
Chloride: 92 mmol/L — ABNORMAL LOW (ref 101–111)
Creatinine, Ser: 5.59 mg/dL — ABNORMAL HIGH (ref 0.61–1.24)
GFR calc Af Amer: 11 mL/min — ABNORMAL LOW (ref >60)
GFR calc non Af Amer: 9 mL/min — ABNORMAL LOW (ref >60)
Glucose, Bld: 76 mg/dL (ref 65–99)
Phosphorus: 4.5 mg/dL (ref 2.5–4.6)
Potassium: 4.9 mmol/L (ref 3.5–5.1)
Sodium: 126 mmol/L — ABNORMAL LOW (ref 135–145)

## 2016-08-10 LAB — ECHOCARDIOGRAM COMPLETE
HEIGHTINCHES: 68 in
WEIGHTICAEL: 2155.22 [oz_av]

## 2016-08-10 LAB — PROCALCITONIN: PROCALCITONIN: 7.13 ng/mL

## 2016-08-10 MED ORDER — LIDOCAINE HCL (PF) 1 % IJ SOLN: 5.0000 mL | INTRAMUSCULAR | Status: DC | PRN

## 2016-08-10 MED ORDER — ALTEPLASE 2 MG IJ SOLR: 2.0000 mg | Freq: Once | INTRAMUSCULAR | Status: DC | PRN

## 2016-08-10 MED ORDER — PENTAFLUOROPROP-TETRAFLUOROETH EX AERO
1.0000 | INHALATION_SPRAY | CUTANEOUS | Status: DC | PRN
Start: 2016-08-10 — End: 2016-08-10

## 2016-08-10 MED ORDER — PENTAFLUOROPROP-TETRAFLUOROETH EX AERO: 1.0000 "application " | INHALATION_SPRAY | CUTANEOUS | Status: DC | PRN

## 2016-08-10 MED ORDER — SODIUM CHLORIDE 0.9 % IV SOLN: 100.0000 mL | INTRAVENOUS | Status: DC | PRN

## 2016-08-10 MED ORDER — HEPARIN SODIUM (PORCINE) 1000 UNIT/ML DIALYSIS: 1000.0000 [IU] | INTRAMUSCULAR | Status: DC | PRN

## 2016-08-10 MED ORDER — SODIUM CHLORIDE 0.9 % IV SOLN
100.0000 mL | INTRAVENOUS | Status: DC | PRN
Start: 2016-08-10 — End: 2016-08-10

## 2016-08-10 MED ORDER — LIDOCAINE-PRILOCAINE 2.5-2.5 % EX CREA: 1.0000 "application " | TOPICAL_CREAM | CUTANEOUS | Status: DC | PRN

## 2016-08-10 MED ORDER — ALTEPLASE 2 MG IJ SOLR
2.0000 mg | Freq: Once | INTRAMUSCULAR | Status: DC | PRN
Start: 2016-08-10 — End: 2016-08-10

## 2016-08-10 NOTE — Care Management Note (Signed)
Case Management Note  Patient Details  Name: KINNIE KAUPP MRN: [id number] Date of Birth: 1945-12-15  Subjective/Objective:    CM following for progression and d/c planning.                 Action/Plan: 08/10/2016 Pt being converted to hemodialysis, HD staff arranging CLIP. Pt and son requesting [facility] HD center in [place]. Await d/c orders.   Expected Discharge Date:                  Expected Discharge Plan:  [place]  In-House Referral:     Discharge planning Services  CM Consult  Post Acute Care Choice:    Choice offered to:     DME Arranged:    DME Agency:     HH Arranged:    [place] Agency:     Status of Service:  In process, will continue to follow  If discussed at Long Length of Stay Meetings, dates discussed:    Additional Comments:  Adron Bene, RN 08/10/2016, 1:30 PM

## 2016-08-10 NOTE — Care Management Important Message (Signed)
Important Message  Patient Details  Name: Darren Dunn MRN: [id number] Date of Birth: [birth year]   Medicare Important Message Given:  Yes    Margean Korell, [name], RN 08/10/2016, 4:37 PM

## 2016-08-10 NOTE — Progress Notes (Signed)
PROGRESS NOTE   Michele Risner Odonell  XFG:[id number]    DOB: 12/15/1945    DOA: 08/06/2016  PCP: Mechele Claude, MD   I have briefly reviewed patients previous medical records in [facility]  Brief Narrative:  71 year old male with PMH of ESRD on PD, bipolar disorder, recently discharged from [facility] 6/19 when he had PD peritonitis and cultures grew Pseudomonas, completed 2 weeks of intraperitoneal Fortaz on 7/3, presented with 1.5 days of lethargy, weakness, fall and multiple skin tears to bilateral upper extremities and an abrasion on the back of the head, hypothermia and cloudy PD fluid. Admitted for suspected recurrent peritonitis secondary to PD catheter. Nephrology consulting for dialysis needs. CAPD catheter removed. TDC placed and started HD on 7/13.  Assessment & Plan:   Principal Problem:   Hyponatremia Active Problems:   Anemia   Bipolar disorder (HCC)   CKD (chronic kidney disease) stage V requiring chronic dialysis (HCC)   Hypothyroidism   HTN (hypertension)   Bacterial peritonitis (HCC)   Peritonitis (HCC)   Hypoxia   Bilateral leg edema   1. Sepsis: Suspected due to possible pneumonia and recurrent PD peritonitis. Blood cultures 2: Negative to date. PD fluid culture 2 here is negative to date. However as per nephrology, Citrobacter Freundii on outpatient culture 7/9 resistant only to Ancef. Continue cefepime. Pro-calcitonin 8.7. 2. PD catheter related recurrent peritonitis: Previously grew Pseudomonas and now Citrobacter. Continue IV cefepime. CAPD catheter removed by general surgery in oral or and TDC inserted by vascular surgery on 7/12. 3. Presumed pneumonia: Not convincing by symptoms. Chest x-ray repeat suggests atelectasis. Blood cultures 2 negative to date. Discontinued vancomycin and azithromycin. Remains on cefepime for peritonitis. 4. ESRD: Nephrology follow-up appreciated and started HD on 7/13. Needs to be clipped to outpatient dialysis. 5. Hypokalemia:  Replaced. 6. Anemia: Stable in the 9 range. 7. Essential hypertension: Controlled. Cozaar held by nephrology who are planning aggressive volume management across dialysis. 8. History of bipolar disorder: On Depakote and Haldol. 9. Hyponatremia: Per nephrology. Continues to improve. 126 on 7/13 10. Acute respiratory failure with hypoxia: Presented with oxygen saturation of 86%. May be related to pneumonia but more likely from volume overload. Treat underlying cause. Oxygen supplements as needed. Wean as tolerated for oxygen saturations >92%. 11. Hypothyroid: TSH 1.487. Continue levothyroxine. 12. Bilateral lower extremity edema: Due to ESRD and hypoalbuminemia. Lower extremity venous Dopplers negative for DVT. Diet management per dietitian consultation. Volume management across dialysis. 13. Dysphagia: On dysphagia 3 diet and nectar thickened liquid per speech therapy recommendations. 14. Thrombocytopenia: Unclear etiology.? Related to antibiotics or infection. Relatively stable. No bleeding. Follow CBCs closely.   DVT prophylaxis: Heparin Code Status: Full Family Communication: None at bedside today. Disposition: Not stable for discharge   Consultants:  Nephrology General surgery Vascular surgery   Procedures:  Peritoneal dialysis  CAPD catheter removed by CCS in all OR on 7/12. Removal of venous component of HeRO graft catheter, right IJ TDC placement by vascular surgery on 7/12 Hemodialysis initiated 7/13   Antimicrobials:  IV cefepime Azithromycin-discontinued Vancomycin-discontinued    Subjective: Seen at HD this morning. Reports appropriate soreness at abdominal CAPD removal site and sutures. No chest pain or dyspnea reported. Denies cough.  ROS: Denies chest pain, dyspnea or abdominal pain.   Objective:  Vitals:   08/10/16 1000 08/10/16 1030 08/10/16 1100 08/10/16 1117  BP: (!) 96/50 (!) 96/47 106/63 (!) 107/58  Pulse: 96 90 94 95  Resp: 11 13 15 16   Temp:  97.7 F (36.5 C)  TempSrc:    Oral  SpO2: 99%   96%  Weight:    61.1 kg (134 lb 11.2 oz)  Height:        Examination:  General exam: Pleasant elderly male lying supine in bed undergoing HD Respiratory system: Slightly diminished breath sounds in the bases but otherwise clear to auscultation without wheezing, rhonchi or crackles. No increased work of breathing. Cardiovascular system: S1 and S2 heard, RRR. No JVD, murmurs.  Gastrointestinal system: Abdomen is nondistended, soft and mild appropriate postoperative tenderness. Surgical dressing clean and dry. No organomegaly or masses felt. Normal bowel sounds heard.  Central nervous system: Alert and oriented 2. No focal neurological deficits. Extremities: Symmetric 5 x 5 power. Skin: No rashes, lesions or ulcers Psychiatry: Judgement and insight impaired. Mood & affect appropriate.     Data Reviewed: I have personally reviewed following labs and imaging studies  CBC:  Recent Labs Lab 08/06/16 1021 08/07/16 0516 08/08/16 0743 08/09/16 0515 08/10/16 0633  WBC 8.3 6.5 6.4 4.3 5.7  HGB 11.8* 9.0* 9.7* 9.3* 9.4*  HCT 34.4* 27.0* 28.4* 27.6* 28.5*  MCV 84.7 84.9 85.5 86.8 88.5  PLT 152 141* 140* 127* 130*   Basic Metabolic Panel:  Recent Labs Lab 08/06/16 1021 08/07/16 0516 08/08/16 0554 08/09/16 0515 08/10/16 0633  NA 121* 118* 121* 123* 126*  K 3.7 4.0 4.3 3.5 4.9  CL 82* 85* 86* 89* 92*  CO2 26 22 23 24 24   GLUCOSE 126* 92 86 110* 76  BUN 22* 27* 31* 28* 38*  CREATININE 4.40* 5.02* 5.02* 4.67* 5.59*  CALCIUM 8.7* 7.7* 7.6* 7.7* 7.6*  PHOS  --   --  4.2 3.5 4.5   Liver Function Tests:  Recent Labs Lab 08/06/16 1021 08/07/16 0516 08/08/16 0554 08/09/16 0515 08/10/16 0633  AST 43* 29  --   --   --   ALT 25 19  --   --   --   ALKPHOS 109 84  --   --   --   BILITOT 0.3 0.7  --   --   --   PROT 6.2* 4.4*  --   --   --   ALBUMIN 2.1* 1.5* 1.6* 1.4* 1.6*   CBG:  Recent Labs Lab 08/07/16 1206  08/07/16 1614  GLUCAP 146* 146*    Recent Results (from the past 240 hour(s))  Culture, blood (routine x 2)     Status: None (Preliminary result)   Collection Time: 08/06/16  6:15 PM  Result Value Ref Range Status   Specimen Description BLOOD LEFT ARM  Final   Special Requests   Final    BOTTLES DRAWN AEROBIC ONLY Blood Culture adequate volume   Culture NO GROWTH 4 DAYS  Final   Report Status PENDING  Incomplete  Culture, blood (routine x 2)     Status: None (Preliminary result)   Collection Time: 08/06/16  6:20 PM  Result Value Ref Range Status   Specimen Description BLOOD LEFT HAND  Final   Special Requests   Final    BOTTLES DRAWN AEROBIC ONLY Blood Culture adequate volume   Culture NO GROWTH 4 DAYS  Final   Report Status PENDING  Incomplete  Body fluid culture     Status: None   Collection Time: 08/07/16 10:30 AM  Result Value Ref Range Status   Specimen Description PERITONEAL  Final   Special Requests Immunocompromised  Final   Gram Stain NO WBC SEEN NO ORGANISMS SEEN  CYTOSPIN SMEAR   Final   Culture NO GROWTH 3 DAYS  Final   Report Status 08/10/2016 FINAL  Final  Culture, body fluid-bottle     Status: None (Preliminary result)   Collection Time: 08/07/16 10:26 PM  Result Value Ref Range Status   Specimen Description FLUID PERITONEAL  Final   Special Requests BOTTLES DRAWN AEROBIC AND ANAEROBIC  Final   Culture NO GROWTH 3 DAYS  Final   Report Status PENDING  Incomplete  Gram stain     Status: None   Collection Time: 08/07/16 10:26 PM  Result Value Ref Range Status   Specimen Description FLUID PERITONEAL  Final   Special Requests NONE  Final   Gram Stain CYTOSPIN SMEAR RARE WBC SEEN NO ORGANISMS SEEN   Final   Report Status 08/08/2016 FINAL  Final         Radiology Studies: Dg Chest Port 1 View  Result Date: 08/09/2016 CLINICAL DATA:  71 y/o  M; right catheter insertion. EXAM: PORTABLE CHEST 1 VIEW COMPARISON:  08/06/2016 chest radiograph. FINDINGS:  Stable cardiac silhouette. Aortic atherosclerosis with calcification. Exchange of right central venous catheter with tip projecting over the cavoatrial junction. Right axillary vascular stent. Retained contrast within visualized bowel. Stable small left pleural effusion. Left basilar opacity is likely associated atelectasis. IMPRESSION: Right central venous catheter with tip projecting over cavoatrial junction. Stable small left pleural effusion. Electronically Signed   By: Mitzi Hansen M.D.   On: 08/09/2016 14:27        Scheduled Meds: . cholecalciferol  2,000 Units Oral Daily  . divalproex  500 mg Oral BID  . feeding supplement (NEPRO CARB STEADY)  237 mL Oral TID BM  . feeding supplement (PRO-STAT SUGAR FREE 64)  30 mL Oral BID  . haloperidol  2 mg Oral QHS  . heparin  5,000 Units Subcutaneous Q8H  . levothyroxine  50 mcg Oral QAC breakfast  . multivitamin  1 tablet Oral QHS  . saccharomyces boulardii  250 mg Oral BID   Continuous Infusions: . sodium chloride 10 mL/hr at 08/09/16 1204  . ceFEPime (MAXIPIME) IV Stopped (08/09/16 1813)     LOS: 4 days     Chistian Kasler, MD, FACP, FHM. [facility] Pager [phone]  If 7PM-7AM, please contact night-coverage [email or web address] Password [facility] 08/10/2016, 2:49 PM

## 2016-08-10 NOTE — Progress Notes (Signed)
[facility] Progress Note  1 Day Post-Op  Subjective: CC: s/p CAPD removal. Currently in HD. Hard of hearing. Reports abdominal soreness over incision sites. Denies issues with bleeding overnight.   Objective: Vital signs in last 24 hours: Temp:  [97.2 F (36.2 C)-98 F (36.7 C)] 98 F (36.7 C) (07/13 0655) Pulse Rate:  [80-109] 103 (07/13 0830) Resp:  [12-17] 17 (07/13 0830) BP: (88-137)/(46-78) 114/65 (07/13 0830) SpO2:  [93 %-100 %] 97 % (07/13 0655) Weight:  [62 kg (136 lb 11 oz)-65.3 kg (144 lb)] 62.7 kg (138 lb 3.7 oz) (07/13 0655) Last BM Date: 08/09/16  Intake/Output from previous day: 07/12 0701 - 07/13 0700 In: 13041.3 [P.O.:237; I.V.:521.3; IV Piggyback:300] Out: 26378 [Blood:25] Intake/Output this shift: No intake/output data recorded.  PE: Gen:  Alert, NAD, pleasant Pulm:  Normal effort,  Abd: Soft, appropriately tender, non-distended, both incisions clean and dry, some dried sanguinous drainage on gauze. No surrounding erythema or active drainage. Skin: warm and dry, no rashes  Psych: A&Ox3   Lab Results:   Recent Labs  08/09/16 0515 08/10/16 0633  WBC 4.3 5.7  HGB 9.3* 9.4*  HCT 27.6* 28.5*  PLT 127* 130*   BMET  Recent Labs  08/09/16 0515 08/10/16 0633  NA 123* 126*  K 3.5 4.9  CL 89* 92*  CO2 24 24  GLUCOSE 110* 76  BUN 28* 38*  CREATININE 4.67* 5.59*  CALCIUM 7.7* 7.6*   PT/INR No results for input(s): LABPROT, INR in the last 72 hours. CMP     Component Value Date/Time   NA 126 (L) 08/10/2016 0633   NA 130 (L) 06/27/2016 1614   K 4.9 08/10/2016 0633   CL 92 (L) 08/10/2016 0633   CO2 24 08/10/2016 0633   GLUCOSE 76 08/10/2016 0633   BUN 38 (H) 08/10/2016 0633   BUN 41 (H) 06/27/2016 1614   CREATININE 5.59 (H) 08/10/2016 0633   CALCIUM 7.6 (L) 08/10/2016 0633   PROT 4.4 (L) 08/07/2016 0516   PROT 5.8 (L) 06/27/2016 1614   ALBUMIN 1.6 (L) 08/10/2016 0633   ALBUMIN 3.5 06/27/2016 1614   AST 29 08/07/2016 0516    ALT 19 08/07/2016 0516   ALKPHOS 84 08/07/2016 0516   BILITOT 0.7 08/07/2016 0516   BILITOT 0.2 06/27/2016 1614   GFRNONAA 9 (L) 08/10/2016 0633   GFRAA 11 (L) 08/10/2016 0633   Lipase     Component Value Date/Time   LIPASE 17 08/06/2016 1021       Studies/Results: Dg Chest Port 1 View  Result Date: 08/09/2016 CLINICAL DATA:  71 y/o  M; right catheter insertion. EXAM: PORTABLE CHEST 1 VIEW COMPARISON:  08/06/2016 chest radiograph. FINDINGS: Stable cardiac silhouette. Aortic atherosclerosis with calcification. Exchange of right central venous catheter with tip projecting over the cavoatrial junction. Right axillary vascular stent. Retained contrast within visualized bowel. Stable small left pleural effusion. Left basilar opacity is likely associated atelectasis. IMPRESSION: Right central venous catheter with tip projecting over cavoatrial junction. Stable small left pleural effusion. Electronically Signed   By: Kristine Garbe M.D.   On: 08/09/2016 14:27    Anti-infectives: Anti-infectives    Start     Dose/Rate Route Frequency Ordered Stop   08/09/16 1800  ceFEPIme (MAXIPIME) 1 g in dextrose 5 % 50 mL IVPB    Comments:  Cefepime 1 g IV q24h for CrCl < 30 mL/min   1 g 100 mL/hr over 30 Minutes Intravenous Every 24 hours 08/09/16 1501  08/09/16 1200  ceFEPIme (MAXIPIME) 1 g in dextrose 5 % 50 mL IVPB  Status:  Discontinued    Comments:  Cefepime 1 g IV q24h for CrCl < 30 mL/min   1 g 100 mL/hr over 30 Minutes Intravenous Every 24 hours 08/09/16 1052 08/09/16 1501   08/08/16 1200  ceFEPIme (MAXIPIME) 1 g in dextrose 5 % 50 mL IVPB  Status:  Discontinued    Comments:  Cefepime 1 g IV q24h for CrCl < 30 mL/min   1 g 100 mL/hr over 30 Minutes Intravenous Every 48 hours 08/07/16 0819 08/07/16 0822   08/07/16 1200  ceFEPIme (MAXIPIME) 1 g in dextrose 5 % 50 mL IVPB  Status:  Discontinued    Comments:  Cefepime 1 g IV q24h for CrCl < 30 mL/min   1 g 100 mL/hr over 30  Minutes Intravenous Every 48 hours 08/07/16 0822 08/09/16 1052   08/07/16 0800  ceFEPIme (MAXIPIME) 1 g in dextrose 5 % 50 mL IVPB  Status:  Discontinued    Comments:  Cefepime 1 g IV q24h for CrCl < 30 mL/min   1 g 100 mL/hr over 30 Minutes Intravenous Every 24 hours 08/07/16 0208 08/07/16 0819   08/07/16 0230  azithromycin (ZITHROMAX) 500 mg in dextrose 5 % 250 mL IVPB  Status:  Discontinued     500 mg 250 mL/hr over 60 Minutes Intravenous Daily at bedtime 08/07/16 0200 08/09/16 0942   08/06/16 2030  vancomycin (VANCOCIN) 1,500 mg in sodium chloride 0.9 % 500 mL IVPB     1,500 mg 250 mL/hr over 120 Minutes Intravenous  Once 08/06/16 2030 08/06/16 2317   08/06/16 2000  vancomycin (VANCOCIN) 1,500 mg in sodium chloride 0.9 % 500 mL IVPB  Status:  Discontinued     1,500 mg 250 mL/hr over 120 Minutes Intravenous  Once 08/06/16 1956 08/06/16 2023   08/06/16 1915  cefTAZidime (FORTAZ) 1 g in dextrose 5 % 50 mL IVPB     1 g 100 mL/hr over 30 Minutes Intravenous  Once 08/06/16 1910 08/06/16 2109     Assessment/Plan ESRD POD#1 s/p CAPD removal  - Right IJ vein tunneled HD catheter exchange performed 7/12 by Dr. Bridgett Larsson - abdominal incisions from PD cath removal are clean/dry/intact; keep wounds clean and dry.  - suture removal in 12-14 days. I will arrange a suture removal visit at our office and place visit info in pts discharge info.    LOS: 4 days    [facility] 08/10/2016, 8:41 AM Pager: [phone] Consults: [phone] Mon-Fri 7:00 am-4:30 pm Sat-Sun 7:00 am-11:30 am

## 2016-08-10 NOTE — Progress Notes (Signed)
Upper Extremity Vein Map Preliminary findings.  Unable to visualize bilateral basilic veins.   Right Cephalic  Segment Diameter Depth Comment  1. Axilla mm mm Unable to image due to line  2. Mid upper arm 2.65mm 2.43mm   3. Above AC 2.98mm 2.68mm   4. In AC mm mm Unable to image due to bandages  5. Below AC mm mm Unable to image due to bandages  6. Mid forearm mm mm Unable to image due to bandages  7. Wrist 1.24mm mm    mm mm    mm mm    mm mm    Left Cephalic  Segment Diameter Depth Comment  1. Axilla 2.48mm 4.43mm   2. Mid upper arm 2.42mm 2.80mm   3. Above AC 3.55mm mm   4. In [facility] 2.35mm mm   5. Below AC 2.51mm mm   6. Mid forearm mm mm Unable to image due to bandages  7. Wrist mm mm Unable to image due to bandages   mm mm    mm mm    mm mm     1:59 PM Landry Mellow, RDMS, RVT
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
## 2016-08-10 NOTE — Progress Notes (Signed)
  Echocardiogram 2D Echocardiogram has been performed.  Darren Dunn [name] 08/10/2016, 2:55 PM

## 2016-08-10 NOTE — Progress Notes (Signed)
[facility] Progress Note  Dialysis Orders: CCPD EDW 62 kg 4 exchanges 3 L fill volume dwell time 1 hr 30 min no pause no day dwell. Uses mostly 2.5%; sometimes has to use 4.25%. HD Resumed 7/13   Assessment/Plan: 1. Sepsis: recurrent peritonitis - OP cultures + for citrobacter freundii from PD clinic. SP cath removal and plan transition for at least short term HD; discussed with son [name] who is agreeable. On Vanc/cefepime dosing per pharmacy  3. ESRD - transition to HD - PD cath removed 7/12. HERO removed and TDC placed 7/12. For HD 7/13.  Appreciate VVS and CCS help with surgeries on short notice.  CLIP in progress.   As he lives in [place], this may be a logistical challenge for the family. Plan HD again on Monday (MWF here).   4. Hypokalemia: K+ up, dc KCl supp 5. Anemia: hgb down to 9.7 - ? Related to ^ volume - continue to trend - not on ESA yet 6. Secondary hyperparathyroidism - . Last OP Phos 1.5 6/11. No binders/VDRA.  7HTN/volume - BP initially low offCozaar but was resumed 7/10 - have stopped again. UF 3L today 8. Nutrition - Changed to D3 with nectar thick liquids after swallowing eval with prostat- alb now 1.4  9  H/O bipolar/schizo disorder. On Depakote and haldol. Per primary.  10. Hyponatremia: slowly improving with UF  Lynnda Child PA-C [facility] Pager [phone] 08/10/2016,8:27 AM  Pt seen, examined and agree w A/P as above.  Kelly Splinter MD [facility] pager [phone]   08/10/2016, 10:40 AM     Subjective:   No c/o's, BP's falling w attempts to UF.    Objective Vitals:   08/10/16 0438 08/10/16 0655 08/10/16 0717 08/10/16 0722  BP: 137/66 130/77 130/74 126/78  Pulse: 94 87 80 80  Resp: 15 16 16 17   Temp: 98 F (36.7 C) 98 F (36.7 C)    TempSrc: Oral Oral    SpO2: 97% 97%    Weight:  62.7 kg (138 lb 3.7 oz)    Height:       Physical Exam General: NAD rouses easily from sleep Heart: RRR Lungs:  no rales Abdomen: generalized tenderness/ PD cath out, site bandaged  Extremities: 1+ pitting edema Dialysis Access: R IJ TDC in use on HD   Additional Objective Labs: Basic Metabolic Panel:  Recent Labs Lab 08/08/16 0554 08/09/16 0515 08/10/16 0633  NA 121* 123* 126*  K 4.3 3.5 4.9  CL 86* 89* 92*  CO2 23 24 24   GLUCOSE 86 110* 76  BUN 31* 28* 38*  CREATININE 5.02* 4.67* 5.59*  CALCIUM 7.6* 7.7* 7.6*  PHOS 4.2 3.5 4.5   Liver Function Tests:  Recent Labs Lab 08/06/16 1021 08/07/16 0516 08/08/16 0554 08/09/16 0515 08/10/16 0633  AST 43* 29  --   --   --   ALT 25 19  --   --   --   ALKPHOS 109 84  --   --   --   BILITOT 0.3 0.7  --   --   --   PROT 6.2* 4.4*  --   --   --   ALBUMIN 2.1* 1.5* 1.6* 1.4* 1.6*    Recent Labs Lab 08/06/16 1021  LIPASE 17   CBC:  Recent Labs Lab 08/06/16 1021 08/07/16 0516 08/08/16 0743 08/09/16 0515 08/10/16 0633  WBC 8.3 6.5 6.4 4.3 5.7  HGB 11.8* 9.0* 9.7* 9.3* 9.4*  HCT 34.4* 27.0*  28.4* 27.6* 28.5*  MCV 84.7 84.9 85.5 86.8 88.5  PLT 152 141* 140* 127* 130*   Blood Culture    Component Value Date/Time   SDES FLUID PERITONEAL 08/07/2016 2226   SDES FLUID PERITONEAL 08/07/2016 2226   SPECREQUEST BOTTLES DRAWN AEROBIC AND ANAEROBIC 08/07/2016 2226   SPECREQUEST NONE 08/07/2016 2226   CULT NO GROWTH 2 DAYS 08/07/2016 2226   REPTSTATUS PENDING 08/07/2016 2226   REPTSTATUS 08/08/2016 FINAL 08/07/2016 2226    Cardiac Enzymes: No results for input(s): CKTOTAL, CKMB, CKMBINDEX, TROPONINI in the last 168 hours. CBG:  Recent Labs Lab 08/07/16 1206 08/07/16 1614  GLUCAP 146* 146*   Iron Studies: No results for input(s): IRON, TIBC, TRANSFERRIN, FERRITIN in the last 72 hours. Lab Results  Component Value Date   INR 1.01 07/17/2016   INR 1.45 03/03/2016   Studies/Results: Dg Chest Port 1 View  Result Date: 08/09/2016 CLINICAL DATA:  71 y/o  M; right catheter insertion. EXAM: PORTABLE CHEST 1 VIEW COMPARISON:   08/06/2016 chest radiograph. FINDINGS: Stable cardiac silhouette. Aortic atherosclerosis with calcification. Exchange of right central venous catheter with tip projecting over the cavoatrial junction. Right axillary vascular stent. Retained contrast within visualized bowel. Stable small left pleural effusion. Left basilar opacity is likely associated atelectasis. IMPRESSION: Right central venous catheter with tip projecting over cavoatrial junction. Stable small left pleural effusion. Electronically Signed   By: Kristine Garbe M.D.   On: 08/09/2016 14:27   Medications: . sodium chloride 10 mL/hr at 08/09/16 1204  . ceFEPime (MAXIPIME) IV Stopped (08/09/16 1813)   . cholecalciferol  2,000 Units Oral Daily  . divalproex  500 mg Oral BID  . feeding supplement (NEPRO CARB STEADY)  237 mL Oral TID BM  . feeding supplement (PRO-STAT SUGAR FREE 64)  30 mL Oral BID  . haloperidol  2 mg Oral QHS  . heparin  5,000 Units Subcutaneous Q8H  . levothyroxine  50 mcg Oral QAC breakfast  . multivitamin  1 tablet Oral QHS  . potassium chloride SA  40 mEq Oral Daily  . saccharomyces boulardii  250 mg Oral BID

## 2016-08-11 DIAGNOSIS — R880 Cloudy (hemodialysis) (peritoneal) dialysis effluent: Secondary | ICD-10-CM | POA: Diagnosis not present

## 2016-08-11 DIAGNOSIS — N2581 Secondary hyperparathyroidism of renal origin: Secondary | ICD-10-CM | POA: Diagnosis not present

## 2016-08-11 DIAGNOSIS — N186 End stage renal disease: Secondary | ICD-10-CM | POA: Diagnosis not present

## 2016-08-11 DIAGNOSIS — N2589 Other disorders resulting from impaired renal tubular function: Secondary | ICD-10-CM | POA: Diagnosis not present

## 2016-08-11 DIAGNOSIS — K769 Liver disease, unspecified: Secondary | ICD-10-CM | POA: Diagnosis not present

## 2016-08-11 DIAGNOSIS — D509 Iron deficiency anemia, unspecified: Secondary | ICD-10-CM | POA: Diagnosis not present

## 2016-08-11 LAB — CULTURE, BLOOD (ROUTINE X 2)
Culture: NO GROWTH
Culture: NO GROWTH
Special Requests: ADEQUATE
Special Requests: ADEQUATE

## 2016-08-11 LAB — HEPATITIS B CORE ANTIBODY, TOTAL: Hep B Core Total Ab: NEGATIVE

## 2016-08-11 LAB — HEPATITIS B SURFACE ANTIBODY,QUALITATIVE: Hep B S Ab: NONREACTIVE

## 2016-08-11 MED ORDER — DARBEPOETIN ALFA 60 MCG/0.3ML IJ SOSY
60.0000 ug | PREFILLED_SYRINGE | INTRAMUSCULAR | Status: DC
  Administered 2016-08-13: 60 ug via INTRAVENOUS
  Filled 2016-08-11: qty 0.3

## 2016-08-11 NOTE — Progress Notes (Signed)
PROGRESS NOTE   Darren Dunn  NFA:[id number]    DOB: 03/05/1945    DOA: 08/06/2016  PCP: Mechele Claude, MD   I have briefly reviewed patients previous medical records in [facility].  Brief Narrative:  71 year old male with PMH of ESRD on PD, bipolar disorder, recently discharged from [facility] 6/19 when he had PD peritonitis and cultures grew Pseudomonas, completed 2 weeks of intraperitoneal Fortaz on 7/3, presented with 1.5 days of lethargy, weakness, fall and multiple skin tears to bilateral upper extremities and an abrasion on the back of the head, hypothermia and cloudy PD fluid. Admitted for suspected recurrent peritonitis secondary to PD catheter. Nephrology consulting for dialysis needs. CAPD catheter removed. TDC placed and started HD on 7/13.  Assessment & Plan:   Principal Problem:   Hyponatremia Active Problems:   Anemia   Bipolar disorder (HCC)   CKD (chronic kidney disease) stage V requiring chronic dialysis (HCC)   Hypothyroidism   HTN (hypertension)   Bacterial peritonitis (HCC)   Peritonitis (HCC)   Hypoxia   Bilateral leg edema   1. Sepsis: Suspected due to possible pneumonia and recurrent PD peritonitis. Blood cultures 2: Negative. PD fluid culture 2 here is negative. However as per nephrology, Citrobacter Freundii on outpatient culture 7/9 resistant only to Ancef. Continue cefepime. Pro-calcitonin 8.7. 2. PD catheter related recurrent peritonitis: Previously grew Pseudomonas and now Citrobacter. Continue IV cefepime-Duration per nephrology. CAPD catheter removed by general surgery in oral or and TDC inserted by vascular surgery on 7/12. 3. Presumed pneumonia: Not convincing by symptoms. Chest x-ray repeat suggests atelectasis. Blood cultures 2 negative. Discontinued vancomycin and azithromycin. Remains on cefepime for peritonitis. 4. ESRD: Nephrology follow-up appreciated and started HD on 7/13. Needs to be clipped to outpatient dialysis. Next HD on  7/16. 5. Hypokalemia: Replaced. 6. Anemia: Stable in the 9 range. 7. Essential hypertension: Controlled. Cozaar held by nephrology who are planning aggressive volume management across dialysis. 8. History of bipolar disorder: On Depakote and Haldol. 9. Hyponatremia: Per nephrology. Continues to improve. 126 on 7/13 10. Acute respiratory failure with hypoxia: Presented with oxygen saturation of 86%. May be related to pneumonia but more likely from volume overload. Treat underlying cause. Oxygen supplements as needed. Wean as tolerated for oxygen saturations >92%. 11. Hypothyroid: TSH 1.487. Continue levothyroxine. 12. Bilateral lower extremity edema: Due to ESRD and hypoalbuminemia. Lower extremity venous Dopplers negative for DVT. Diet management per dietitian consultation. Volume management across dialysis. 13. Dysphagia: On dysphagia 3 diet and nectar thickened liquid per speech therapy recommendations. 14. Thrombocytopenia: Unclear etiology.? Related to antibiotics or infection. Relatively stable. No bleeding. Follow CBCs closely.   DVT prophylaxis: Heparin Code Status: Full Family Communication: None at bedside today. Disposition: Not medically ready for discharge.   Consultants:  Nephrology General surgery Vascular surgery   Procedures:  Peritoneal dialysis  CAPD catheter removed by CCS in all OR on 7/12. Removal of venous component of HeRO graft catheter, right IJ TDC placement by vascular surgery on 7/12 Hemodialysis initiated 7/13   Antimicrobials:  IV cefepime Azithromycin-discontinued Vancomycin-discontinued    Subjective: Appeared slightly confused today. As per RN, patient did not know that his CAPD catheter had been removed. Reports soreness at CAPD catheter removal site but no pain elsewhere or dyspnea.  ROS: Denies chest pain, dyspnea or abdominal pain.   Objective:  Vitals:   08/10/16 2110 08/11/16 0044 08/11/16 0420 08/11/16 1000  BP: 130/67 (!) 132/59  125/67 127/68  Pulse: 100 100 95 98  Resp: 17 18 17 16   Temp: 97.6 F (36.4 C) 97.8 F (36.6 C) 97.7 F (36.5 C) 98 F (36.7 C)  TempSrc: Oral Oral Oral Oral  SpO2: 99% 93% 95% 96%  Weight: 61.3 kg (135 lb 2.3 oz)     Height:        Examination:  General exam: Pleasant elderly male lying supine in bed. Respiratory system: Clear to auscultation. No increased work of breathing. Cardiovascular system: S1 and S2 heard, RRR. No JVD, murmurs. Telemetry: Sinus rhythm. Gastrointestinal system: Abdomen is nondistended, soft and mild appropriate postoperative tenderness. Surgical dressing clean and dry. No organomegaly or masses felt. Normal bowel sounds heard.  Central nervous system: Alert and oriented 2. No focal neurological deficits. Extremities: Symmetric 5 x 5 power. Skin: No rashes, lesions or ulcers Psychiatry: Judgement and insight impaired. Mood & affect appropriate.     Data Reviewed: I have personally reviewed following labs and imaging studies  CBC:  Recent Labs Lab 08/06/16 1021 08/07/16 0516 08/08/16 0743 08/09/16 0515 08/10/16 0633  WBC 8.3 6.5 6.4 4.3 5.7  HGB 11.8* 9.0* 9.7* 9.3* 9.4*  HCT 34.4* 27.0* 28.4* 27.6* 28.5*  MCV 84.7 84.9 85.5 86.8 88.5  PLT 152 141* 140* 127* 130*   Basic Metabolic Panel:  Recent Labs Lab 08/06/16 1021 08/07/16 0516 08/08/16 0554 08/09/16 0515 08/10/16 0633  NA 121* 118* 121* 123* 126*  K 3.7 4.0 4.3 3.5 4.9  CL 82* 85* 86* 89* 92*  CO2 26 22 23 24 24   GLUCOSE 126* 92 86 110* 76  BUN 22* 27* 31* 28* 38*  CREATININE 4.40* 5.02* 5.02* 4.67* 5.59*  CALCIUM 8.7* 7.7* 7.6* 7.7* 7.6*  PHOS  --   --  4.2 3.5 4.5   Liver Function Tests:  Recent Labs Lab 08/06/16 1021 08/07/16 0516 08/08/16 0554 08/09/16 0515 08/10/16 0633  AST 43* 29  --   --   --   ALT 25 19  --   --   --   ALKPHOS 109 84  --   --   --   BILITOT 0.3 0.7  --   --   --   PROT 6.2* 4.4*  --   --   --   ALBUMIN 2.1* 1.5* 1.6* 1.4* 1.6*    CBG:  Recent Labs Lab 08/07/16 1206 08/07/16 1614  GLUCAP 146* 146*    Recent Results (from the past 240 hour(s))  Culture, blood (routine x 2)     Status: None   Collection Time: 08/06/16  6:15 PM  Result Value Ref Range Status   Specimen Description BLOOD LEFT ARM  Final   Special Requests   Final    BOTTLES DRAWN AEROBIC ONLY Blood Culture adequate volume   Culture NO GROWTH 5 DAYS  Final   Report Status 08/11/2016 FINAL  Final  Culture, blood (routine x 2)     Status: None   Collection Time: 08/06/16  6:20 PM  Result Value Ref Range Status   Specimen Description BLOOD LEFT HAND  Final   Special Requests   Final    BOTTLES DRAWN AEROBIC ONLY Blood Culture adequate volume   Culture NO GROWTH 5 DAYS  Final   Report Status 08/11/2016 FINAL  Final  Body fluid culture     Status: None   Collection Time: 08/07/16 10:30 AM  Result Value Ref Range Status   Specimen Description PERITONEAL  Final   Special Requests Immunocompromised  Final   Gram Stain NO WBC SEEN  NO ORGANISMS SEEN CYTOSPIN SMEAR   Final   Culture NO GROWTH 3 DAYS  Final   Report Status 08/10/2016 FINAL  Final  Culture, body fluid-bottle     Status: None (Preliminary result)   Collection Time: 08/07/16 10:26 PM  Result Value Ref Range Status   Specimen Description FLUID PERITONEAL  Final   Special Requests BOTTLES DRAWN AEROBIC AND ANAEROBIC  Final   Culture NO GROWTH 4 DAYS  Final   Report Status PENDING  Incomplete  Gram stain     Status: None   Collection Time: 08/07/16 10:26 PM  Result Value Ref Range Status   Specimen Description FLUID PERITONEAL  Final   Special Requests NONE  Final   Gram Stain CYTOSPIN SMEAR RARE WBC SEEN NO ORGANISMS SEEN   Final   Report Status 08/08/2016 FINAL  Final         Radiology Studies: No results found.      Scheduled Meds: . cholecalciferol  2,000 Units Oral Daily  . [START ON 08/13/2016] darbepoetin (ARANESP) injection - DIALYSIS  60 mcg Intravenous  Q Mon-HD  . divalproex  500 mg Oral BID  . feeding supplement (NEPRO CARB STEADY)  237 mL Oral TID BM  . feeding supplement (PRO-STAT SUGAR FREE 64)  30 mL Oral BID  . haloperidol  2 mg Oral QHS  . heparin  5,000 Units Subcutaneous Q8H  . levothyroxine  50 mcg Oral QAC breakfast  . multivitamin  1 tablet Oral QHS  . saccharomyces boulardii  250 mg Oral BID   Continuous Infusions: . sodium chloride 10 mL/hr at 08/09/16 1204  . ceFEPime (MAXIPIME) IV 1 g (08/10/16 1750)     LOS: 5 days     Jiali Linney, MD, FACP, FHM. [facility] Pager [phone]  If 7PM-7AM, please contact night-coverage [email or web address] Password [facility] 08/11/2016, 2:59 PM

## 2016-08-11 NOTE — Progress Notes (Signed)
[facility] Progress Note   Dialysis Orders: prior CCPD - transitioned to CCPD 7/13  Assessment/Plan: 1. Sepsis: recurrent peritonitis - OP cultures + for citrobacter freundii from PD clinic. Cultures at [facility] no growth.- had received abtx at outpatient unit prior to admission. S/p  cath removal 7/12. On cefepime. Echo EF 55 - 60% no vegetations 3. ESRD - transition to HD -HERO removed and [place] placed 7/12.  Appreciate VVS and CCS help with surgeries on short notice.  CLIP in progress.  Pt says they may be going to HD in [place]. HD again on Monday (MWF here).   4. Hyponatremia: slowly improving with UF 5. Anemia: hgb down to 9.4; start ESA Monday -  6. Secondary hyperparathyroidism - . Last OP Phos 1.5 6/11. No binders/VDRA.  7HTN/volume -cozaar stopped to help with UF.  Net UF 1563 Friday with post weight 61.1 - need to establish a EDW for d/c 8. Nutrition - Changed to D3 with nectar thick liquids after swallowing evalwith prostat- alb now 1.6 9  H/O bipolar/schizo disorder. On Depakote and haldol. Per primary.  Myriam Jacobson, PA-C [place] [phone] 08/11/2016,10:46 AM  LOS: 5 days   Pt seen, examined and agree w A/P as above.  Looking for OP HD unit, will likely be here until Monday. Doing well overall.   Kelly Splinter MD [employer] pager [phone]   08/11/2016, 12:52 PM   Subjective:   Tired. No problems with HD yesterday. Stomach still hurts  Objective Vitals:   08/10/16 1757 08/10/16 2110 08/11/16 0044 08/11/16 0420  BP: 134/66 130/67 (!) 132/59 125/67  Pulse: (!) 102 100 100 95  Resp: 16 17 18 17   Temp: 98.4 F (36.9 C) 97.6 F (36.4 C) 97.8 F (36.6 C) 97.7 F (36.5 C)  TempSrc: Oral Oral Oral Oral  SpO2: 92% 99% 93% 95%  Weight:  61.3 kg (135 lb 2.3 oz)    Height:       Physical Exam General: NAD sitting up in bed Heart: RRR Lungs: no rales Abdomen: soft mild tenderness - PD cath out - exit  sutures in tach Extremities: + LE edema Dialysis Access:  Right IJ [facility]   Additional Objective Labs: Basic Metabolic Panel:  Recent Labs Lab 08/08/16 0554 08/09/16 0515 08/10/16 0633  NA 121* 123* 126*  K 4.3 3.5 4.9  CL 86* 89* 92*  CO2 23 24 24   GLUCOSE 86 110* 76  BUN 31* 28* 38*  CREATININE 5.02* 4.67* 5.59*  CALCIUM 7.6* 7.7* 7.6*  PHOS 4.2 3.5 4.5   Liver Function Tests:  Recent Labs Lab 08/06/16 1021 08/07/16 0516 08/08/16 0554 08/09/16 0515 08/10/16 0633  AST 43* 29  --   --   --   ALT 25 19  --   --   --   ALKPHOS 109 84  --   --   --   BILITOT 0.3 0.7  --   --   --   PROT 6.2* 4.4*  --   --   --   ALBUMIN 2.1* 1.5* 1.6* 1.4* 1.6*    Recent Labs Lab 08/06/16 1021  LIPASE 17   CBC:  Recent Labs Lab 08/06/16 1021 08/07/16 0516 08/08/16 0743 08/09/16 0515 08/10/16 0633  WBC 8.3 6.5 6.4 4.3 5.7  HGB 11.8* 9.0* 9.7* 9.3* 9.4*  HCT 34.4* 27.0* 28.4* 27.6* 28.5*  MCV 84.7 84.9 85.5 86.8 88.5  PLT 152 141* 140* 127* 130*   Blood Culture  Component Value Date/Time   SDES FLUID PERITONEAL 08/07/2016 2226   SDES FLUID PERITONEAL 08/07/2016 2226   SPECREQUEST BOTTLES DRAWN AEROBIC AND ANAEROBIC 08/07/2016 2226   SPECREQUEST NONE 08/07/2016 2226   CULT NO GROWTH 4 DAYS 08/07/2016 2226   REPTSTATUS PENDING 08/07/2016 2226   REPTSTATUS 08/08/2016 FINAL 08/07/2016 2226    Cardiac Enzymes: No results for input(s): CKTOTAL, CKMB, CKMBINDEX, TROPONINI in the last 168 hours. CBG:  Recent Labs Lab 08/07/16 1206 08/07/16 1614  GLUCAP 146* 146*   Iron Studies: No results for input(s): IRON, TIBC, TRANSFERRIN, FERRITIN in the last 72 hours. Lab Results  Component Value Date   INR 1.01 07/17/2016   INR 1.45 03/03/2016   Studies/Results: Dg Chest Port 1 View  Result Date: 08/09/2016 CLINICAL DATA:  71 y/o  M; right catheter insertion. EXAM: PORTABLE CHEST 1 VIEW COMPARISON:  08/06/2016 chest radiograph. FINDINGS: Stable cardiac silhouette.  Aortic atherosclerosis with calcification. Exchange of right central venous catheter with tip projecting over the cavoatrial junction. Right axillary vascular stent. Retained contrast within visualized bowel. Stable small left pleural effusion. Left basilar opacity is likely associated atelectasis. IMPRESSION: Right central venous catheter with tip projecting over cavoatrial junction. Stable small left pleural effusion. Electronically Signed   By: Kristine Garbe M.D.   On: 08/09/2016 14:27   Medications: . sodium chloride 10 mL/hr at 08/09/16 1204  . ceFEPime (MAXIPIME) IV 1 g (08/10/16 1750)   . cholecalciferol  2,000 Units Oral Daily  . divalproex  500 mg Oral BID  . feeding supplement (NEPRO CARB STEADY)  237 mL Oral TID BM  . feeding supplement (PRO-STAT SUGAR FREE 64)  30 mL Oral BID  . haloperidol  2 mg Oral QHS  . heparin  5,000 Units Subcutaneous Q8H  . levothyroxine  50 mcg Oral QAC breakfast  . multivitamin  1 tablet Oral QHS  . saccharomyces boulardii  250 mg Oral BID

## 2016-08-12 DIAGNOSIS — R880 Cloudy (hemodialysis) (peritoneal) dialysis effluent: Secondary | ICD-10-CM | POA: Diagnosis not present

## 2016-08-12 DIAGNOSIS — K769 Liver disease, unspecified: Secondary | ICD-10-CM | POA: Diagnosis not present

## 2016-08-12 DIAGNOSIS — N2581 Secondary hyperparathyroidism of renal origin: Secondary | ICD-10-CM | POA: Diagnosis not present

## 2016-08-12 DIAGNOSIS — N186 End stage renal disease: Secondary | ICD-10-CM | POA: Diagnosis not present

## 2016-08-12 DIAGNOSIS — D509 Iron deficiency anemia, unspecified: Secondary | ICD-10-CM | POA: Diagnosis not present

## 2016-08-12 DIAGNOSIS — N2589 Other disorders resulting from impaired renal tubular function: Secondary | ICD-10-CM | POA: Diagnosis not present

## 2016-08-12 LAB — CULTURE, BODY FLUID W GRAM STAIN -BOTTLE

## 2016-08-12 LAB — CULTURE, BODY FLUID-BOTTLE: CULTURE: NO GROWTH

## 2016-08-12 LAB — PROCALCITONIN: PROCALCITONIN: 4.18 ng/mL

## 2016-08-12 MED ORDER — SENNA 8.6 MG PO TABS
2.0000 | ORAL_TABLET | Freq: Every day | ORAL | Status: DC
  Administered 2016-08-12 – 2016-08-13 (×2): 17.2 mg via ORAL
  Filled 2016-08-12 (×2): qty 2

## 2016-08-12 MED ORDER — POLYETHYLENE GLYCOL 3350 17 G PO PACK
17.0000 g | PACK | Freq: Every day | ORAL | Status: DC
  Administered 2016-08-12: 17 g via ORAL
  Filled 2016-08-12 (×2): qty 1

## 2016-08-12 NOTE — Progress Notes (Signed)
Pharmacy Antibiotic Note  Darren Dunn is a 71 y.o. male with ESRD on peritoneal dialysis admitted on 08/06/2016 with peritonitis and also with concern for pneumonia. Pharmacy is dosing Cefepime for Sepsis due to HCAP vs recurrent PD peritonitis.  Patient had PD catheter removed 7/10 and had TDC placed on 7/12.   Blood cultures 2: Negative. PD fluid culture 2 here is negative. However as per nephrology, Citrobacter Freundii on outpatient culture 7/9 resistant only to Ancef. Continue cefepime. Pro-calcitonin 8.7.  Switched from PD to HD, HD starting 7/13 HDx4 h  done on Friday 7/13. Plan for HD again on Monday 7/16 (MWF here)   Plan: Continue Cefepime 1g IV q24h [place]-up culture results, dialysis plans/schedule/tolerance, and clinical status.   Height: 5\' 8"  (172.7 cm) Weight: 135 lb 2.3 oz (61.3 kg) IBW/kg (Calculated) : 68.4  Temp (24hrs), Avg:98 F (36.7 C), Min:97.7 F (36.5 C), Max:98.2 F (36.8 C)   Recent Labs Lab 08/06/16 1021 08/06/16 1830 08/07/16 0516 08/08/16 0554 08/08/16 0743 08/09/16 0515 08/10/16 0633  WBC 8.3  --  6.5  --  6.4 4.3 5.7  CREATININE 4.40*  --  5.02* 5.02*  --  4.67* 5.59*  LATICACIDVEN  --  1.98*  --   --   --   --   --     Estimated Creatinine Clearance: 10.7 mL/min (A) (by C-G formula based on SCr of 5.59 mg/dL (H)).    Allergies  Allergen Reactions  . Sulfa Antibiotics Other (See Comments)    Bruises.    Antimicrobials this admission: Fortaz 7/9 x1 Vancomycin 7/9 >> *1500mg  given IV 7/9 Cefepime 7/10>> Azith 7/10>>7/12  Dose adjustments this admission: 7/10: cefepime re-entered for q48h 7/12: cefepime changed to1 g q24h in anticipation of HD starting (HDx 4h started 7/13)  Microbiology results: 7/10 peritoneal fluid: ngtd, nothing on gram stain 7/10 fluid chem: 1893 WBC, 4 lymphs, 80 macrophils, clear/colorless 7/9BCx: neg Output cx: citerobacter, R to Ancef only [6/21 peritoneal fluid: pseudomonas]   Nicole Cella,  RPh Clinical Pharmacist Pager: [phone] If after 3:30pm, please call main pharmacy at [phone] 08/12/2016 5:14 PM

## 2016-08-12 NOTE — Progress Notes (Signed)
[facility] Progress Note    Dialysis Orders: prior CCPD - transitioned to CCPD 7/13  Assessment/Plan: 1. Sepsis: recurrent peritonitis- OP cultures + for citrobacter freundii from PD clinic. Cultures at [facility] no growth.- had received abtx at outpatient unit prior to admission. S/p  PD cath removal 7/12. On cefepime. Echo EF 55 - 60% no vegetations 3. ESRD - transition to HD -HERO removed and [place] placed 7/12. Appreciate VVS and CCS help with surgeries on short notice. CLIP in progress. Pt says they may be going to HD in [place]. HD again on Monday (MWF here). Hopefully we will have a disposition by Monday 4. Hyponatremia: slowly improving with UF 5. Anemia: hgb down to 9.4; start ESA Monday -  6. Secondary hyperparathyroidism - . P 4.5  No binders prior to admission/VDRA.  7HTN/volume -cozaar stopped to help with UF.  Net UF 1563 Friday with post weight 61.1 - need to establish a EDW for d/c 8. Nutrition - Changed to D3 with nectar thick liquids after swallowing evalwith prostat- alb now 1.6 9 H/O bipolar/schizo disorder. On Depakote and haldol. Per primary.  Myriam Jacobson, PA-C [place] [phone] 08/12/2016,9:50 AM  LOS: 6 days   Pt seen, examined and agree w A/P as above.  Kelly Splinter MD [facility] pager [phone]   08/12/2016, 12:41 PM    Subjective:   No c/o  Objective Vitals:   08/11/16 1000 08/11/16 1800 08/11/16 2116 08/12/16 0423  BP: 127/68 132/72 (!) 141/68 (!) 118/57  Pulse: 98 92 89 88  Resp: 16 18 18 18   Temp: 98 F (36.7 C) 98 F (36.7 C) 98.2 F (36.8 C) 97.7 F (36.5 C)  TempSrc: Oral Oral Oral Oral  SpO2: 96% 98% 93% 97%  Weight:      Height:       Physical Exam General: NAD tardive dyskinesia Heart: RRR Lungs: no rales Abdomen:soft NT today Extremities: tr - 1+ LE edema Dialysis Access: right IJ   Additional Objective Labs: Basic Metabolic Panel:  Recent Labs Lab  08/08/16 0554 08/09/16 0515 08/10/16 0633  NA 121* 123* 126*  K 4.3 3.5 4.9  CL 86* 89* 92*  CO2 23 24 24   GLUCOSE 86 110* 76  BUN 31* 28* 38*  CREATININE 5.02* 4.67* 5.59*  CALCIUM 7.6* 7.7* 7.6*  PHOS 4.2 3.5 4.5   Liver Function Tests:  Recent Labs Lab 08/06/16 1021 08/07/16 0516 08/08/16 0554 08/09/16 0515 08/10/16 0633  AST 43* 29  --   --   --   ALT 25 19  --   --   --   ALKPHOS 109 84  --   --   --   BILITOT 0.3 0.7  --   --   --   PROT 6.2* 4.4*  --   --   --   ALBUMIN 2.1* 1.5* 1.6* 1.4* 1.6*    Recent Labs Lab 08/06/16 1021  LIPASE 17   CBC:  Recent Labs Lab 08/06/16 1021 08/07/16 0516 08/08/16 0743 08/09/16 0515 08/10/16 0633  WBC 8.3 6.5 6.4 4.3 5.7  HGB 11.8* 9.0* 9.7* 9.3* 9.4*  HCT 34.4* 27.0* 28.4* 27.6* 28.5*  MCV 84.7 84.9 85.5 86.8 88.5  PLT 152 141* 140* 127* 130*   Blood Culture    Component Value Date/Time   SDES FLUID PERITONEAL 08/07/2016 2226   SDES FLUID PERITONEAL 08/07/2016 2226   SPECREQUEST BOTTLES DRAWN AEROBIC AND ANAEROBIC 08/07/2016 2226   SPECREQUEST NONE 08/07/2016 2226  CULT NO GROWTH 4 DAYS 08/07/2016 2226   REPTSTATUS PENDING 08/07/2016 2226   REPTSTATUS 08/08/2016 FINAL 08/07/2016 2226  CBG:  Recent Labs Lab 08/07/16 1206 08/07/16 1614  GLUCAP 146* 146*   Medications: . sodium chloride 10 mL/hr at 08/09/16 1204  . ceFEPime (MAXIPIME) IV 1 g (08/11/16 1752)   . cholecalciferol  2,000 Units Oral Daily  . [START ON 08/13/2016] darbepoetin (ARANESP) injection - DIALYSIS  60 mcg Intravenous Q Mon-HD  . divalproex  500 mg Oral BID  . feeding supplement (NEPRO CARB STEADY)  237 mL Oral TID BM  . feeding supplement (PRO-STAT SUGAR FREE 64)  30 mL Oral BID  . haloperidol  2 mg Oral QHS  . heparin  5,000 Units Subcutaneous Q8H  . levothyroxine  50 mcg Oral QAC breakfast  . multivitamin  1 tablet Oral QHS  . saccharomyces boulardii  250 mg Oral BID

## 2016-08-12 NOTE — Progress Notes (Signed)
PROGRESS NOTE   Darren Dunn  GMW:[id number]    DOB: 18-Jul-1945    DOA: 08/06/2016  PCP: Mechele Claude, MD   I have briefly reviewed patients previous medical [facility].  Brief Narrative:  71 year old male with PMH of ESRD on PD, bipolar disorder, recently discharged from [facility] 6/19 when he had PD peritonitis and cultures grew Pseudomonas, completed 2 weeks of intraperitoneal Fortaz on 7/3, presented with 1.5 days of lethargy, weakness, fall and multiple skin tears to bilateral upper extremities and an abrasion on the back of the head, hypothermia and cloudy PD fluid. Admitted for suspected recurrent peritonitis secondary to PD catheter. Nephrology consulting for dialysis needs. CAPD catheter removed. TDC placed and started HD on 7/13.  Assessment & Plan:   Principal Problem:   Hyponatremia Active Problems:   Anemia   Bipolar disorder (HCC)   CKD (chronic kidney disease) stage V requiring chronic dialysis (HCC)   Hypothyroidism   HTN (hypertension)   Bacterial peritonitis (HCC)   Peritonitis (HCC)   Hypoxia   Bilateral leg edema   1. Sepsis: Suspected due to possible pneumonia and recurrent PD peritonitis. Blood cultures 2: Negative. PD fluid culture 2 here is negative. However as per nephrology, Citrobacter Freundii on outpatient culture 7/9 resistant only to Ancef. Continue cefepime. Pro-calcitonin 8.7. 2. PD catheter related recurrent peritonitis: Previously grew Pseudomonas and now Citrobacter. Continue IV cefepime-Duration per nephrology. CAPD catheter removed by general surgery in oral or and TDC inserted by vascular surgery on 7/12. 3. Presumed pneumonia: Not convincing by symptoms. Chest x-ray repeat suggests atelectasis. Blood cultures 2 negative. Discontinued abx for this indication. 4. ESRD: Nephrology follow-up appreciated and started HD on 7/13. Needs to be clipped to outpatient dialysis. Next HD on 7/16. 5. Hypokalemia: Replaced. 6. Anemia: Stable  in the 9 range. 7. Essential hypertension: Controlled. Cozaar held by nephrology who are planning aggressive volume management across dialysis. 8. History of bipolar disorder: On Depakote and Haldol. 9. Hyponatremia: Per nephrology. Continues to improve. 126 on 7/13 10. Acute respiratory failure with hypoxia: Presented with oxygen saturation of 86%. May be related to pneumonia but more likely from volume overload. Treat underlying cause. Oxygen supplements as needed. Wean as tolerated for oxygen saturations >92%. 11. Hypothyroid: TSH 1.487. Continue levothyroxine. 12. Bilateral lower extremity edema: Due to ESRD and hypoalbuminemia. Lower extremity venous Dopplers negative for DVT. Diet management per dietitian consultation. Volume management across dialysis. 13. Dysphagia: On dysphagia 3 diet and nectar thickened liquid per speech therapy recommendations. 14. Thrombocytopenia: Unclear etiology.? Related to antibiotics or infection. Relatively stable. No bleeding. Follow CBCs closely. 15. Constipation: Bowel regimen.   DVT prophylaxis: Heparin Code Status: Full Family Communication: None at bedside today. Disposition: DC pending nephrology clearance.   Consultants:  Nephrology General surgery Vascular surgery   Procedures:  Peritoneal dialysis  CAPD catheter removed by CCS in all OR on 7/12. Removal of venous component of HeRO graft catheter, right IJ TDC placement by vascular surgery on 7/12 Hemodialysis initiated 7/13   Antimicrobials:  IV cefepime Azithromycin-discontinued Vancomycin-discontinued    Subjective: Continue pain/soreness that abdominal surgical site. Constipation. No nausea or vomiting. Tolerating diet. No fevers.  ROS: Denies chest pain, dyspnea or abdominal pain.   Objective:  Vitals:   08/11/16 1800 08/11/16 2116 08/12/16 0423 08/12/16 1032  BP: 132/72 (!) 141/68 (!) 118/57 (!) 113/54  Pulse: 92 89 88 91  Resp: 18 18 18 17   Temp: 98 F (36.7 C)  98.2 F (36.8 C)  97.7 F (36.5 C) 97.9 F (36.6 C)  TempSrc: Oral Oral Oral Oral  SpO2: 98% 93% 97% 96%  Weight:      Height:        Examination:  General exam: Pleasant elderly male lying supine in bed. Respiratory system: Clear to auscultation. No increased work of breathing. Cardiovascular system: S1 and S2 heard, RRR. No JVD, murmurs. Telemetry: Sinus rhythm> DC. Gastrointestinal system: Abdomen is nondistended, soft and mild appropriate postoperative tenderness. Surgical dressing clean and dry. No organomegaly or masses felt. Normal bowel sounds heard.  Central nervous system: Alert and oriented 2. No focal neurological deficits. Extremities: Symmetric 5 x 5 power. Skin: No rashes, lesions or ulcers Psychiatry: Judgement and insight impaired. Mood & affect appropriate.     Data Reviewed: I have personally reviewed following labs and imaging studies  CBC:  Recent Labs Lab 08/06/16 1021 08/07/16 0516 08/08/16 0743 08/09/16 0515 08/10/16 0633  WBC 8.3 6.5 6.4 4.3 5.7  HGB 11.8* 9.0* 9.7* 9.3* 9.4*  HCT 34.4* 27.0* 28.4* 27.6* 28.5*  MCV 84.7 84.9 85.5 86.8 88.5  PLT 152 141* 140* 127* 130*   Basic Metabolic Panel:  Recent Labs Lab 08/06/16 1021 08/07/16 0516 08/08/16 0554 08/09/16 0515 08/10/16 0633  NA 121* 118* 121* 123* 126*  K 3.7 4.0 4.3 3.5 4.9  CL 82* 85* 86* 89* 92*  CO2 26 22 23 24 24   GLUCOSE 126* 92 86 110* 76  BUN 22* 27* 31* 28* 38*  CREATININE 4.40* 5.02* 5.02* 4.67* 5.59*  CALCIUM 8.7* 7.7* 7.6* 7.7* 7.6*  PHOS  --   --  4.2 3.5 4.5   Liver Function Tests:  Recent Labs Lab 08/06/16 1021 08/07/16 0516 08/08/16 0554 08/09/16 0515 08/10/16 0633  AST 43* 29  --   --   --   ALT 25 19  --   --   --   ALKPHOS 109 84  --   --   --   BILITOT 0.3 0.7  --   --   --   PROT 6.2* 4.4*  --   --   --   ALBUMIN 2.1* 1.5* 1.6* 1.4* 1.6*   CBG:  Recent Labs Lab 08/07/16 1206 08/07/16 1614  GLUCAP 146* 146*    Recent Results (from the  past 240 hour(s))  Culture, blood (routine x 2)     Status: None   Collection Time: 08/06/16  6:15 PM  Result Value Ref Range Status   Specimen Description BLOOD LEFT ARM  Final   Special Requests   Final    BOTTLES DRAWN AEROBIC ONLY Blood Culture adequate volume   Culture NO GROWTH 5 DAYS  Final   Report Status 08/11/2016 FINAL  Final  Culture, blood (routine x 2)     Status: None   Collection Time: 08/06/16  6:20 PM  Result Value Ref Range Status   Specimen Description BLOOD LEFT HAND  Final   Special Requests   Final    BOTTLES DRAWN AEROBIC ONLY Blood Culture adequate volume   Culture NO GROWTH 5 DAYS  Final   Report Status 08/11/2016 FINAL  Final  Body fluid culture     Status: None   Collection Time: 08/07/16 10:30 AM  Result Value Ref Range Status   Specimen Description PERITONEAL  Final   Special Requests Immunocompromised  Final   Gram Stain NO WBC SEEN NO ORGANISMS SEEN CYTOSPIN SMEAR   Final   Culture NO GROWTH 3 DAYS  Final  Report Status 08/10/2016 FINAL  Final  Culture, body fluid-bottle     Status: None   Collection Time: 08/07/16 10:26 PM  Result Value Ref Range Status   Specimen Description FLUID PERITONEAL  Final   Special Requests BOTTLES DRAWN AEROBIC AND ANAEROBIC  Final   Culture NO GROWTH 5 DAYS  Final   Report Status 08/12/2016 FINAL  Final  Gram stain     Status: None   Collection Time: 08/07/16 10:26 PM  Result Value Ref Range Status   Specimen Description FLUID PERITONEAL  Final   Special Requests NONE  Final   Gram Stain CYTOSPIN SMEAR RARE WBC SEEN NO ORGANISMS SEEN   Final   Report Status 08/08/2016 FINAL  Final         Radiology Studies: No results found.      Scheduled Meds: . cholecalciferol  2,000 Units Oral Daily  . [START ON 08/13/2016] darbepoetin (ARANESP) injection - DIALYSIS  60 mcg Intravenous Q Mon-HD  . divalproex  500 mg Oral BID  . feeding supplement (NEPRO CARB STEADY)  237 mL Oral TID BM  . feeding  supplement (PRO-STAT SUGAR FREE 64)  30 mL Oral BID  . haloperidol  2 mg Oral QHS  . heparin  5,000 Units Subcutaneous Q8H  . levothyroxine  50 mcg Oral QAC breakfast  . multivitamin  1 tablet Oral QHS  . saccharomyces boulardii  250 mg Oral BID   Continuous Infusions: . sodium chloride 10 mL/hr at 08/09/16 1204  . ceFEPime (MAXIPIME) IV 1 g (08/11/16 1752)     LOS: 6 days     Miryah Ralls, MD, FACP, FHM. [facility] Pager [phone]  If 7PM-7AM, please contact night-coverage [email or web address] Password [facility] 08/12/2016, 3:28 PM

## 2016-08-13 DIAGNOSIS — R880 Cloudy (hemodialysis) (peritoneal) dialysis effluent: Secondary | ICD-10-CM | POA: Diagnosis not present

## 2016-08-13 DIAGNOSIS — I10 Essential (primary) hypertension: Secondary | ICD-10-CM

## 2016-08-13 DIAGNOSIS — N186 End stage renal disease: Secondary | ICD-10-CM | POA: Diagnosis not present

## 2016-08-13 DIAGNOSIS — N2581 Secondary hyperparathyroidism of renal origin: Secondary | ICD-10-CM | POA: Diagnosis not present

## 2016-08-13 DIAGNOSIS — K769 Liver disease, unspecified: Secondary | ICD-10-CM | POA: Diagnosis not present

## 2016-08-13 DIAGNOSIS — D509 Iron deficiency anemia, unspecified: Secondary | ICD-10-CM | POA: Diagnosis not present

## 2016-08-13 DIAGNOSIS — N2589 Other disorders resulting from impaired renal tubular function: Secondary | ICD-10-CM | POA: Diagnosis not present

## 2016-08-13 LAB — RENAL FUNCTION PANEL
Albumin: 1.5 g/dL — ABNORMAL LOW (ref 3.5–5.0)
Anion gap: 13 (ref 5–15)
BUN: 50 mg/dL — ABNORMAL HIGH (ref 6–20)
CO2: 22 mmol/L (ref 22–32)
Calcium: 7.9 mg/dL — ABNORMAL LOW (ref 8.9–10.3)
Chloride: 97 mmol/L — ABNORMAL LOW (ref 101–111)
Creatinine, Ser: 6.97 mg/dL — ABNORMAL HIGH (ref 0.61–1.24)
GFR calc Af Amer: 8 mL/min — ABNORMAL LOW (ref >60)
GFR calc non Af Amer: 7 mL/min — ABNORMAL LOW (ref >60)
Glucose, Bld: 48 mg/dL — ABNORMAL LOW (ref 65–99)
Phosphorus: 6.3 mg/dL — ABNORMAL HIGH (ref 2.5–4.6)
Potassium: 4.9 mmol/L (ref 3.5–5.1)
Sodium: 132 mmol/L — ABNORMAL LOW (ref 135–145)

## 2016-08-13 LAB — CBC
HCT: 25.2 % — ABNORMAL LOW (ref 39.0–52.0)
Hemoglobin: 8 g/dL — ABNORMAL LOW (ref 13.0–17.0)
MCH: 29.2 pg (ref 26.0–34.0)
MCHC: 31.7 g/dL (ref 30.0–36.0)
MCV: 92 fL (ref 78.0–100.0)
Platelets: 173 10*3/uL (ref 150–400)
RBC: 2.74 MIL/uL — ABNORMAL LOW (ref 4.22–5.81)
RDW: 18.8 % — ABNORMAL HIGH (ref 11.5–15.5)
WBC: 8.2 10*3/uL (ref 4.0–10.5)

## 2016-08-13 MED ORDER — SODIUM CHLORIDE 0.9 % IV SOLN: 100.0000 mL | INTRAVENOUS | Status: DC | PRN

## 2016-08-13 MED ORDER — DEXTROSE 5 % IV SOLN
2.0000 g | INTRAVENOUS | Status: DC
  Administered 2016-08-13: 2 g via INTRAVENOUS
  Filled 2016-08-13: qty 2

## 2016-08-13 MED ORDER — SENNA 8.6 MG PO TABS: 1.0000 | ORAL_TABLET | Freq: Every day | ORAL | 0 refills | Status: DC | PRN

## 2016-08-13 MED ORDER — ALTEPLASE 2 MG IJ SOLR
2.0000 mg | Freq: Once | INTRAMUSCULAR | Status: DC | PRN
Start: 2016-08-13 — End: 2016-08-13

## 2016-08-13 MED ORDER — RENA-VITE PO TABS: 1.0000 | ORAL_TABLET | Freq: Every day | ORAL | 0 refills | Status: AC

## 2016-08-13 MED ORDER — HEPARIN SODIUM (PORCINE) 1000 UNIT/ML DIALYSIS: 1000.0000 [IU] | INTRAMUSCULAR | Status: DC | PRN

## 2016-08-13 MED ORDER — DARBEPOETIN ALFA 60 MCG/0.3ML IJ SOSY
PREFILLED_SYRINGE | INTRAMUSCULAR | Status: AC
  Administered 2016-08-13: 60 ug via INTRAVENOUS
  Filled 2016-08-13: qty 0.3

## 2016-08-13 MED ORDER — DEXTROSE 5 % IV SOLN: 2.0000 g | INTRAVENOUS | Status: DC

## 2016-08-13 MED ORDER — LIDOCAINE-PRILOCAINE 2.5-2.5 % EX CREA: 1.0000 "application " | TOPICAL_CREAM | CUTANEOUS | Status: DC | PRN

## 2016-08-13 MED ORDER — PRO-STAT SUGAR FREE PO LIQD: 30.0000 mL | Freq: Two times a day (BID) | ORAL | 0 refills | Status: DC

## 2016-08-13 MED ORDER — PENTAFLUOROPROP-TETRAFLUOROETH EX AERO: 1.0000 "application " | INHALATION_SPRAY | CUTANEOUS | Status: DC | PRN

## 2016-08-13 MED ORDER — LIDOCAINE HCL (PF) 1 % IJ SOLN: 5.0000 mL | INTRAMUSCULAR | Status: DC | PRN

## 2016-08-13 MED ORDER — HEPARIN SODIUM (PORCINE) 1000 UNIT/ML DIALYSIS: 20.0000 [IU]/kg | INTRAMUSCULAR | Status: DC | PRN

## 2016-08-13 MED ORDER — RESOURCE THICKENUP CLEAR PO POWD: ORAL | 0 refills | Status: DC

## 2016-08-13 MED ORDER — POLYETHYLENE GLYCOL 3350 17 G PO PACK: 17.0000 g | PACK | Freq: Every day | ORAL | 0 refills | Status: DC

## 2016-08-13 NOTE — Progress Notes (Addendum)
[facility] Progress Note    Dialysis Orders: prior CCPD - transitioned to HD 7/13  Assessment/Plan: 1. Sepsis: recurrent peritonitis- OP cultures + for citrobacter freundii from PD clinic. Cultures at [facility] no growth.- had received abtx at outpatient unit prior to admission. S/p  PD cath removal 7/12. On cefepime. Echo EF 55 - 60% no vegetations 3. ESRD - transition to HD -HERO removed and [place] placed 7/12. Appreciate VVS and CCS help with surgeries on short notice. CLIP'd--> has spot at [facility] for MWF 2nd shift- can start Wed.  HD again on Monday (MWF here). 4. Hyponatremia: slowly improving with UF 5. Anemia: hgb down to 9.4; ESA to start today  6. Secondary hyperparathyroidism - . P 4.5  No binders prior to admission/VDRA.  7HTN/volume -cozaar stopped to help with UF.  Net UF 1563 Friday with post weight 61.1 - need to establish a EDW for d/c 8. Nutrition - Changed to D3 with nectar thick liquids after swallowing evalwith prostat- alb now 1.6 9 H/O bipolar/schizo disorder. On Depakote and haldol. Per primary.  [name] MD [facility] pgr [phone] 08/13/2016,8:37 AM  LOS: 7 days     Subjective:   No c/o  Objective Vitals:   08/13/16 0605 08/13/16 0748 08/13/16 0754 08/13/16 0800  BP: (!) 122/57     Pulse: 92 (P) 93 (P) 94 (P) 94  Resp: 18     Temp: 98.1 F (36.7 C) (P) 98.2 F (36.8 C)    TempSrc: Oral (P) Oral    SpO2: 100%     Weight:      Height:       Physical Exam General: NAD sleeping, arousable Heart: RRR Lungs: no rales Abdomen:soft NT today, PD cath removed and dressing c/d/i Extremities: no LE edema Dialysis Access: right IJ (tunneled)   Additional Objective Labs: Basic Metabolic Panel:  Recent Labs Lab 08/08/16 0554 08/09/16 0515 08/10/16 0633  NA 121* 123* 126*  K 4.3 3.5 4.9  CL 86* 89* 92*  CO2 23 24 24   GLUCOSE 86 110* 76  BUN 31* 28* 38*  CREATININE 5.02* 4.67* 5.59*  CALCIUM 7.6* 7.7*  7.6*  PHOS 4.2 3.5 4.5   Liver Function Tests:  Recent Labs Lab 08/06/16 1021 08/07/16 0516 08/08/16 0554 08/09/16 0515 08/10/16 0633  AST 43* 29  --   --   --   ALT 25 19  --   --   --   ALKPHOS 109 84  --   --   --   BILITOT 0.3 0.7  --   --   --   PROT 6.2* 4.4*  --   --   --   ALBUMIN 2.1* 1.5* 1.6* 1.4* 1.6*    Recent Labs Lab 08/06/16 1021  LIPASE 17   CBC:  Recent Labs Lab 08/06/16 1021 08/07/16 0516 08/08/16 0743 08/09/16 0515 08/10/16 0633  WBC 8.3 6.5 6.4 4.3 5.7  HGB 11.8* 9.0* 9.7* 9.3* 9.4*  HCT 34.4* 27.0* 28.4* 27.6* 28.5*  MCV 84.7 84.9 85.5 86.8 88.5  PLT 152 141* 140* 127* 130*   Blood Culture    Component Value Date/Time   SDES FLUID PERITONEAL 08/07/2016 2226   SDES FLUID PERITONEAL 08/07/2016 2226   SPECREQUEST BOTTLES DRAWN AEROBIC AND ANAEROBIC 08/07/2016 2226   SPECREQUEST NONE 08/07/2016 2226   CULT NO GROWTH 5 DAYS 08/07/2016 2226   REPTSTATUS 08/12/2016 FINAL 08/07/2016 2226   REPTSTATUS 08/08/2016 FINAL 08/07/2016 2226  CBG:  Recent Labs Lab 08/07/16  1206 08/07/16 1614  GLUCAP 146* 146*   Medications: . sodium chloride 10 mL/hr at 08/09/16 1204  . ceFEPime (MAXIPIME) IV 1 g (08/12/16 1815)   . cholecalciferol  2,000 Units Oral Daily  . darbepoetin (ARANESP) injection - DIALYSIS  60 mcg Intravenous Q Mon-HD  . divalproex  500 mg Oral BID  . feeding supplement (NEPRO CARB STEADY)  237 mL Oral TID BM  . feeding supplement (PRO-STAT SUGAR FREE 64)  30 mL Oral BID  . haloperidol  2 mg Oral QHS  . heparin  5,000 Units Subcutaneous Q8H  . levothyroxine  50 mcg Oral QAC breakfast  . multivitamin  1 tablet Oral QHS  . polyethylene glycol  17 g Oral Daily  . saccharomyces boulardii  250 mg Oral BID  . senna  2 tablet Oral Daily

## 2016-08-13 NOTE — Progress Notes (Signed)
Accepted at [place] 1st treatment Wednesday 08/15/16 at 1:10 am Tentative schedule is: Mon  Wed,  Fri .Chairtime is 1:30 pm

## 2016-08-13 NOTE — Progress Notes (Signed)
Physical Therapy Treatment Patient Details Name: Darren Dunn MRN: [id number] DOB: [birth year] Today's Date: 08/13/2016    History of Present Illness Darren Dunn is a 71 y.o. male with ESRD on peritoneal dialysis admitted on 08/06/2016 with peritonitis and also with concern for pneumonia.    PT Comments    Pt is not able to walk this afternoon but has been quite tired today.  Has been able to do bed exercises, able to follow instructions and maintain mobility in LE's until next visit to reduce his fall risk and increase quality of gait.  Follow acutely for same.   Follow Up Recommendations  Home health PT     Equipment Recommendations  None recommended by PT    Recommendations for Other Services OT consult     Precautions / Restrictions Precautions Precautions: Fall Precaution Comments: contact ESBL Restrictions Weight Bearing Restrictions: No    Mobility  Bed Mobility                  Transfers                    Ambulation/Gait                 Stairs            Wheelchair Mobility    Modified Rankin (Stroke Patients Only)       Balance                                            Cognition Arousal/Alertness: Awake/alert Behavior During Therapy: WFL for tasks assessed/performed Overall Cognitive Status: History of cognitive impairments - at baseline                                 General Comments: per chart has reduced ST memory      Exercises General Exercises - Lower Extremity Ankle Circles/Pumps: AROM;Both;5 reps Quad Sets: AROM;Both;10 reps Heel Slides: AROM;Both;10 reps Hip ABduction/ADduction: AROM;Both;10 reps Hip Flexion/Marching: AROM;Both;10 reps    General Comments General comments (skin integrity, edema, etc.): Pt is in bed when PT arrived and did not want to do anything.  He agreed to bed exercises, very motivated to get them done.      Pertinent Vitals/Pain Pain  Assessment: Faces Faces Pain Scale: Hurts little more Pain Location: R foot    Home Living                      Prior Function            PT Goals (current goals can now be found in the care plan section) Acute Rehab PT Goals Patient Stated Goal: to get home  Progress towards PT goals: Progressing toward goals    Frequency    Min 3X/week      PT Plan Current plan remains appropriate    Co-evaluation              AM-PAC PT "6 Clicks" Daily Activity  Outcome Measure  Difficulty turning over in bed (including adjusting bedclothes, sheets and blankets)?: A Lot Difficulty moving from lying on back to sitting on the side of the bed? : A Lot Difficulty sitting down on and standing up from a chair with arms (e.g., wheelchair, bedside commode, etc,.)?: Total Help  needed moving to and from a bed to chair (including a wheelchair)?: A Little Help needed walking in hospital room?: A Little Help needed climbing 3-5 steps with a railing? : A Lot 6 Click Score: 13    End of Session   Activity Tolerance: Patient tolerated treatment well Patient left: with call bell/phone within reach;with bed alarm set;in bed Nurse Communication: Mobility status PT Visit Diagnosis: Unsteadiness on feet (R26.81)     Time: 1635-1700 PT Time Calculation (min) (ACUTE ONLY): 25 min  Charges:  $Therapeutic Exercise: 23-37 mins                    G Codes:  Functional Assessment Tool Used: AM-PAC 6 Clicks Basic Mobility    Ramond Dial 08/13/2016, 6:08 PM   Mee Hives, PT MS Acute Rehab Dept. Number: [place] [facility] [place]
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
## 2016-08-13 NOTE — Progress Notes (Signed)
Pharmacy Antibiotic Note  Darren Dunn is a 71 y.o. male with ESRD on peritoneal dialysis PTA admitted on 08/06/2016 with peritonitis and also with concern for pneumonia. Pharmacy is consulted for dosing of cefepime.  Patient was previously on peritoneal dialysis, but PD catheter was removed and started hemodialysis on 7/13. Has been CLIP'd to outpatient HD center on a MWF schedule.  Discussed with Dr. Hollie Salk- plan for full 21 days of treatment for peritonitis as this is a recurrent infection.    Plan: Due to availability in HD centers, switch cefepime to ceftazidime Ceftazidime 2g IV qMWF with last dose to be given on 7/30 to complete 21 days of therapy   Height: 5\' 8"  (172.7 cm) Weight: 142 lb 3.2 oz (64.5 kg) IBW/kg (Calculated) : 68.4  Temp (24hrs), Avg:98.1 F (36.7 C), Min:98.1 F (36.7 C), Max:98.2 F (36.8 C)   Recent Labs Lab 08/06/16 1830 08/07/16 0516 08/08/16 0554 08/08/16 0743 08/09/16 0515 08/10/16 0633 08/13/16 0812  WBC  --  6.5  --  6.4 4.3 5.7 8.2  CREATININE  --  5.02* 5.02*  --  4.67* 5.59* 6.97*  LATICACIDVEN 1.98*  --   --   --   --   --   --     Estimated Creatinine Clearance: 9 mL/min (A) (by C-G formula based on SCr of 6.97 mg/dL (H)).    Allergies  Allergen Reactions  . Sulfa Antibiotics Other (See Comments)    Bruises.    Antimicrobials this admission: Fortaz 7/9 x1; 7/16 >> (7/30) Azith 7/10>>7/12 Vancomycin 7/9 >>7/12 *1500mg  given IV 7/9 Cefepime 7/10>>7/15   Dose adjustments this admission: 7/10: cefepime re-entered for q48h 7/12: cefepime changed to 1 g q24h in anticipation of HD starting  Microbiology results: 7/10 peritoneal fluid: neg, nothing on gram stain 7/10 peritoneal washings: neg 7/10 fluid chem: 1893 WBC, 4 lymphs, 80 macrophils, clear/colorless 7/9 BCx: Neg Output cx: citerobacter, R to Ancef only; S to cefepime, ceftazidime and ceftriaxone  [6/21 peritoneal fluid: pseudomonas]   Darren Dunn, PharmD,  BCPS Clinical Pharmacist Pager: [phone] Clinical Phone for 08/13/2016 until 3:30pm: [phone] If after 3:30pm, please call main pharmacy at [phone] 08/13/2016 11:42 AM

## 2016-08-13 NOTE — Progress Notes (Signed)
Darren Dunn to be D/C'd Home per MD order.  Discussed prescriptions and follow up appointments with the patient. Prescriptions given to patient, medication list explained in detail. Pt verbalized understanding.  Allergies as of 08/13/2016      Reactions   Sulfa Antibiotics Other (See Comments)   Bruises.      Medication List    STOP taking these medications   azithromycin 500 MG tablet Commonly known as:  ZITHROMAX   KLOR-CON M20 20 MEQ tablet Generic drug:  potassium chloride SA   losartan 25 MG tablet Commonly known as:  COZAAR   losartan 50 MG tablet Commonly known as:  COZAAR     TAKE these medications   cefTAZidime 2 g in dextrose 5 % 50 mL Inject 2 g into the vein every Monday, Wednesday, and Friday at 6 PM. For 7 doses beginning 08/13/16. To be given across dialysis.   divalproex 500 MG DR tablet Commonly known as:  DEPAKOTE Take 500 mg by mouth 2 (two) times daily.   feeding supplement (NEPRO CARB STEADY) Liqd Take 237 mLs by mouth 2 (two) times daily between meals. What changed:  when to take this  reasons to take this   feeding supplement (PRO-STAT SUGAR FREE 64) Liqd Take 30 mLs by mouth 2 (two) times daily.   haloperidol 2 MG tablet Commonly known as:  HALDOL Take 2 mg by mouth at bedtime.   levothyroxine 50 MCG tablet Commonly known as:  SYNTHROID, LEVOTHROID TAKE 1 TABLET (50 MCG TOTAL) BY MOUTH DAILY.   multivitamin Tabs tablet Take 1 tablet by mouth at bedtime.   polyethylene glycol packet Commonly known as:  MIRALAX / GLYCOLAX Take 17 g by mouth daily.   RESOURCE THICKENUP CLEAR Powd Oral, as needed.   senna 8.6 MG Tabs tablet Commonly known as:  SENOKOT Take 1 tablet (8.6 mg total) by mouth daily as needed for mild constipation or moderate constipation.   Vitamin D 2000 units Caps Take 2,000 Units by mouth daily.       Vitals:   08/13/16 1200 08/13/16 1210  BP: 120/61 117/64  Pulse: 87 87  Resp:  18  Temp:  98 F (36.7  C)    Skin clean, dry and intact without evidence of skin break down, no evidence of skin tears noted. IV catheter discontinued intact. Site without signs and symptoms of complications. Dressing and pressure applied. Pt denies pain at this time. No complaints noted.  An After Visit Summary was printed and given to the patient. Patient escorted via [place], and D/C home via private auto.  Emilio Math, RN [facility] Phone [phone]

## 2016-08-13 NOTE — Care Management Important Message (Signed)
Important Message  Patient Details  Name: Darren Dunn MRN: [id number] Date of Birth: [birth year]   Medicare Important Message Given:  Yes    Herndon Grill, [name], RN 08/13/2016, 12:10 PM

## 2016-08-13 NOTE — Procedures (Signed)
Patient seen and examined on Hemodialysis. Doing well today.  No complaints.  Says he's feeling much better  QB 150 via R IJ TDC  UF goal 1L Treatment adjusted as needed.  [name] MD [facility] pgr [phone] 8:39 AM

## 2016-08-13 NOTE — Discharge Instructions (Addendum)

## 2016-08-14 ENCOUNTER — Ambulatory Visit: Payer: Medicare Other | Admitting: Gastroenterology

## 2016-08-14 ENCOUNTER — Ambulatory Visit: Payer: Medicare Other | Admitting: General Surgery

## 2016-08-15 DIAGNOSIS — N2581 Secondary hyperparathyroidism of renal origin: Secondary | ICD-10-CM | POA: Diagnosis not present

## 2016-08-15 DIAGNOSIS — D509 Iron deficiency anemia, unspecified: Secondary | ICD-10-CM | POA: Diagnosis not present

## 2016-08-15 DIAGNOSIS — N186 End stage renal disease: Secondary | ICD-10-CM | POA: Diagnosis not present

## 2016-08-15 DIAGNOSIS — D631 Anemia in chronic kidney disease: Secondary | ICD-10-CM | POA: Diagnosis not present

## 2016-08-16 DIAGNOSIS — T8571XD Infection and inflammatory reaction due to peritoneal dialysis catheter, subsequent encounter: Secondary | ICD-10-CM | POA: Diagnosis not present

## 2016-08-16 DIAGNOSIS — B965 Pseudomonas (aeruginosa) (mallei) (pseudomallei) as the cause of diseases classified elsewhere: Secondary | ICD-10-CM | POA: Diagnosis not present

## 2016-08-17 DIAGNOSIS — D631 Anemia in chronic kidney disease: Secondary | ICD-10-CM | POA: Diagnosis not present

## 2016-08-17 DIAGNOSIS — N2581 Secondary hyperparathyroidism of renal origin: Secondary | ICD-10-CM | POA: Diagnosis not present

## 2016-08-17 DIAGNOSIS — N186 End stage renal disease: Secondary | ICD-10-CM | POA: Diagnosis not present

## 2016-08-17 DIAGNOSIS — B965 Pseudomonas (aeruginosa) (mallei) (pseudomallei) as the cause of diseases classified elsewhere: Secondary | ICD-10-CM | POA: Diagnosis not present

## 2016-08-17 DIAGNOSIS — T8571XD Infection and inflammatory reaction due to peritoneal dialysis catheter, subsequent encounter: Secondary | ICD-10-CM | POA: Diagnosis not present

## 2016-08-17 DIAGNOSIS — D509 Iron deficiency anemia, unspecified: Secondary | ICD-10-CM | POA: Diagnosis not present

## 2016-08-20 ENCOUNTER — Telehealth: Payer: Self-pay | Admitting: Vascular Surgery

## 2016-08-20 ENCOUNTER — Encounter: Payer: Self-pay | Admitting: Surgery

## 2016-08-20 DIAGNOSIS — N2581 Secondary hyperparathyroidism of renal origin: Secondary | ICD-10-CM | POA: Diagnosis not present

## 2016-08-20 DIAGNOSIS — N186 End stage renal disease: Secondary | ICD-10-CM | POA: Diagnosis not present

## 2016-08-20 DIAGNOSIS — D631 Anemia in chronic kidney disease: Secondary | ICD-10-CM | POA: Diagnosis not present

## 2016-08-20 DIAGNOSIS — D509 Iron deficiency anemia, unspecified: Secondary | ICD-10-CM | POA: Diagnosis not present

## 2016-08-20 DIAGNOSIS — B965 Pseudomonas (aeruginosa) (mallei) (pseudomallei) as the cause of diseases classified elsewhere: Secondary | ICD-10-CM | POA: Diagnosis not present

## 2016-08-20 DIAGNOSIS — T8571XD Infection and inflammatory reaction due to peritoneal dialysis catheter, subsequent encounter: Secondary | ICD-10-CM | POA: Diagnosis not present

## 2016-08-20 NOTE — Telephone Encounter (Signed)
Spoke to daughter to sch appt 09/03/16 at 8:30.

## 2016-08-20 NOTE — Telephone Encounter (Signed)
-----   Message from Denman George, RN sent at 08/17/2016  4:45 PM EDT ----- Regarding: needs office visit to discuss new permanent access Juanell Fairly, NP, from [facility] called to request scheduling pt. for new access.  Discussed with Dr. Oneida Alar; needs office visit to discuss/ plan for surgery.  S/p Removal venous component of HeRO Graft, and Insertion of Dialysis Cath. 7/12.  Pt. had vein mapping done in the hospital, recently.  Try to schedule sooner than later, per request of [facility].  Thanks.

## 2016-08-20 NOTE — Telephone Encounter (Signed)
-----   Message from Denman George, RN sent at 08/17/2016  4:45 PM EDT ----- Regarding: needs office visit to discuss new permanent access Darren Fairly, NP, from [facility] called to request scheduling pt. for new access.  Discussed with Dr. Oneida Alar; needs office visit to discuss/ plan for surgery.  S/p Removal venous component of HeRO Graft, and Insertion of Dialysis Cath. 7/12.  Pt. had vein mapping done in the hospital, recently.  Try to schedule sooner than later, per request of [facility].  Thanks.

## 2016-08-21 DIAGNOSIS — T8571XD Infection and inflammatory reaction due to peritoneal dialysis catheter, subsequent encounter: Secondary | ICD-10-CM | POA: Diagnosis not present

## 2016-08-21 DIAGNOSIS — B965 Pseudomonas (aeruginosa) (mallei) (pseudomallei) as the cause of diseases classified elsewhere: Secondary | ICD-10-CM | POA: Diagnosis not present

## 2016-08-22 ENCOUNTER — Ambulatory Visit: Payer: Medicare Other | Admitting: Vascular Surgery

## 2016-08-22 DIAGNOSIS — T8571XD Infection and inflammatory reaction due to peritoneal dialysis catheter, subsequent encounter: Secondary | ICD-10-CM | POA: Diagnosis not present

## 2016-08-22 DIAGNOSIS — N2581 Secondary hyperparathyroidism of renal origin: Secondary | ICD-10-CM | POA: Diagnosis not present

## 2016-08-22 DIAGNOSIS — D631 Anemia in chronic kidney disease: Secondary | ICD-10-CM | POA: Diagnosis not present

## 2016-08-22 DIAGNOSIS — N186 End stage renal disease: Secondary | ICD-10-CM | POA: Diagnosis not present

## 2016-08-22 DIAGNOSIS — B965 Pseudomonas (aeruginosa) (mallei) (pseudomallei) as the cause of diseases classified elsewhere: Secondary | ICD-10-CM | POA: Diagnosis not present

## 2016-08-22 DIAGNOSIS — D509 Iron deficiency anemia, unspecified: Secondary | ICD-10-CM | POA: Diagnosis not present

## 2016-08-23 DIAGNOSIS — T8571XD Infection and inflammatory reaction due to peritoneal dialysis catheter, subsequent encounter: Secondary | ICD-10-CM | POA: Diagnosis not present

## 2016-08-23 DIAGNOSIS — B965 Pseudomonas (aeruginosa) (mallei) (pseudomallei) as the cause of diseases classified elsewhere: Secondary | ICD-10-CM | POA: Diagnosis not present

## 2016-08-24 DIAGNOSIS — N2581 Secondary hyperparathyroidism of renal origin: Secondary | ICD-10-CM | POA: Diagnosis not present

## 2016-08-24 DIAGNOSIS — D509 Iron deficiency anemia, unspecified: Secondary | ICD-10-CM | POA: Diagnosis not present

## 2016-08-24 DIAGNOSIS — N186 End stage renal disease: Secondary | ICD-10-CM | POA: Diagnosis not present

## 2016-08-24 DIAGNOSIS — D631 Anemia in chronic kidney disease: Secondary | ICD-10-CM | POA: Diagnosis not present

## 2016-08-27 DIAGNOSIS — N186 End stage renal disease: Secondary | ICD-10-CM | POA: Diagnosis not present

## 2016-08-27 DIAGNOSIS — N2581 Secondary hyperparathyroidism of renal origin: Secondary | ICD-10-CM | POA: Diagnosis not present

## 2016-08-27 DIAGNOSIS — D631 Anemia in chronic kidney disease: Secondary | ICD-10-CM | POA: Diagnosis not present

## 2016-08-27 DIAGNOSIS — D509 Iron deficiency anemia, unspecified: Secondary | ICD-10-CM | POA: Diagnosis not present

## 2016-08-28 DIAGNOSIS — T8571XD Infection and inflammatory reaction due to peritoneal dialysis catheter, subsequent encounter: Secondary | ICD-10-CM | POA: Diagnosis not present

## 2016-08-28 DIAGNOSIS — Z992 Dependence on renal dialysis: Secondary | ICD-10-CM | POA: Diagnosis not present

## 2016-08-28 DIAGNOSIS — B965 Pseudomonas (aeruginosa) (mallei) (pseudomallei) as the cause of diseases classified elsewhere: Secondary | ICD-10-CM | POA: Diagnosis not present

## 2016-08-28 DIAGNOSIS — N186 End stage renal disease: Secondary | ICD-10-CM | POA: Diagnosis not present

## 2016-08-28 DIAGNOSIS — I7789 Other specified disorders of arteries and arterioles: Secondary | ICD-10-CM | POA: Diagnosis not present

## 2016-08-29 ENCOUNTER — Ambulatory Visit (INDEPENDENT_AMBULATORY_CARE_PROVIDER_SITE_OTHER): Payer: Medicare Other

## 2016-08-29 ENCOUNTER — Ambulatory Visit (INDEPENDENT_AMBULATORY_CARE_PROVIDER_SITE_OTHER): Payer: Medicare Other | Admitting: Family Medicine

## 2016-08-29 ENCOUNTER — Encounter: Payer: Self-pay | Admitting: Family Medicine

## 2016-08-29 VITALS — BP 112/73 | HR 79 | Temp 97.0°F | Ht 68.0 in | Wt 128.0 lb

## 2016-08-29 DIAGNOSIS — I10 Essential (primary) hypertension: Secondary | ICD-10-CM | POA: Diagnosis not present

## 2016-08-29 DIAGNOSIS — B965 Pseudomonas (aeruginosa) (mallei) (pseudomallei) as the cause of diseases classified elsewhere: Secondary | ICD-10-CM | POA: Diagnosis not present

## 2016-08-29 DIAGNOSIS — J189 Pneumonia, unspecified organism: Secondary | ICD-10-CM

## 2016-08-29 DIAGNOSIS — N186 End stage renal disease: Secondary | ICD-10-CM | POA: Diagnosis not present

## 2016-08-29 DIAGNOSIS — D631 Anemia in chronic kidney disease: Secondary | ICD-10-CM | POA: Diagnosis not present

## 2016-08-29 DIAGNOSIS — D509 Iron deficiency anemia, unspecified: Secondary | ICD-10-CM | POA: Diagnosis not present

## 2016-08-29 DIAGNOSIS — N2581 Secondary hyperparathyroidism of renal origin: Secondary | ICD-10-CM | POA: Diagnosis not present

## 2016-08-29 DIAGNOSIS — E034 Atrophy of thyroid (acquired): Secondary | ICD-10-CM | POA: Diagnosis not present

## 2016-08-29 DIAGNOSIS — T8571XD Infection and inflammatory reaction due to peritoneal dialysis catheter, subsequent encounter: Secondary | ICD-10-CM | POA: Diagnosis not present

## 2016-08-29 DIAGNOSIS — Z992 Dependence on renal dialysis: Secondary | ICD-10-CM

## 2016-08-29 DIAGNOSIS — J159 Unspecified bacterial pneumonia: Secondary | ICD-10-CM
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
## 2016-08-30 DIAGNOSIS — T8571XD Infection and inflammatory reaction due to peritoneal dialysis catheter, subsequent encounter: Secondary | ICD-10-CM | POA: Diagnosis not present

## 2016-08-30 DIAGNOSIS — B965 Pseudomonas (aeruginosa) (mallei) (pseudomallei) as the cause of diseases classified elsewhere: Secondary | ICD-10-CM | POA: Diagnosis not present

## 2016-08-31 ENCOUNTER — Encounter: Payer: Self-pay | Admitting: Surgery

## 2016-08-31 DIAGNOSIS — N186 End stage renal disease: Secondary | ICD-10-CM | POA: Diagnosis not present

## 2016-08-31 DIAGNOSIS — D509 Iron deficiency anemia, unspecified: Secondary | ICD-10-CM | POA: Diagnosis not present

## 2016-08-31 DIAGNOSIS — N2581 Secondary hyperparathyroidism of renal origin: Secondary | ICD-10-CM | POA: Diagnosis not present

## 2016-08-31 DIAGNOSIS — D631 Anemia in chronic kidney disease: Secondary | ICD-10-CM | POA: Diagnosis not present

## 2016-09-03 ENCOUNTER — Ambulatory Visit: Payer: Medicare Other | Admitting: Surgery

## 2016-09-03 ENCOUNTER — Telehealth: Payer: Self-pay

## 2016-09-03 DIAGNOSIS — T8571XD Infection and inflammatory reaction due to peritoneal dialysis catheter, subsequent encounter: Secondary | ICD-10-CM | POA: Diagnosis not present

## 2016-09-03 DIAGNOSIS — B965 Pseudomonas (aeruginosa) (mallei) (pseudomallei) as the cause of diseases classified elsewhere: Secondary | ICD-10-CM | POA: Diagnosis not present

## 2016-09-03 DIAGNOSIS — N186 End stage renal disease: Secondary | ICD-10-CM | POA: Diagnosis not present

## 2016-09-03 DIAGNOSIS — N2581 Secondary hyperparathyroidism of renal origin: Secondary | ICD-10-CM | POA: Diagnosis not present

## 2016-09-03 DIAGNOSIS — D509 Iron deficiency anemia, unspecified: Secondary | ICD-10-CM | POA: Diagnosis not present

## 2016-09-03 DIAGNOSIS — D631 Anemia in chronic kidney disease: Secondary | ICD-10-CM | POA: Diagnosis not present

## 2016-09-03 NOTE — Telephone Encounter (Signed)
He can try high fiber diet. If ongoing and certainly if worsening needs to be seen for stool tests

## 2016-09-03 NOTE — Telephone Encounter (Signed)
Pt aware.

## 2016-09-03 NOTE — Telephone Encounter (Signed)
Dr Livia Snellen PCP   Diarrhea x every 2 hours x 1 month   Anything he can take?

## 2016-09-05 DIAGNOSIS — N2581 Secondary hyperparathyroidism of renal origin: Secondary | ICD-10-CM | POA: Diagnosis not present

## 2016-09-05 DIAGNOSIS — B965 Pseudomonas (aeruginosa) (mallei) (pseudomallei) as the cause of diseases classified elsewhere: Secondary | ICD-10-CM | POA: Diagnosis not present

## 2016-09-05 DIAGNOSIS — N186 End stage renal disease: Secondary | ICD-10-CM | POA: Diagnosis not present

## 2016-09-05 DIAGNOSIS — D509 Iron deficiency anemia, unspecified: Secondary | ICD-10-CM | POA: Diagnosis not present

## 2016-09-05 DIAGNOSIS — D631 Anemia in chronic kidney disease: Secondary | ICD-10-CM | POA: Diagnosis not present

## 2016-09-05 DIAGNOSIS — T8571XD Infection and inflammatory reaction due to peritoneal dialysis catheter, subsequent encounter: Secondary | ICD-10-CM | POA: Diagnosis not present

## 2016-09-07 DIAGNOSIS — N186 End stage renal disease: Secondary | ICD-10-CM | POA: Diagnosis not present

## 2016-09-07 DIAGNOSIS — D631 Anemia in chronic kidney disease: Secondary | ICD-10-CM | POA: Diagnosis not present

## 2016-09-07 DIAGNOSIS — B965 Pseudomonas (aeruginosa) (mallei) (pseudomallei) as the cause of diseases classified elsewhere: Secondary | ICD-10-CM | POA: Diagnosis not present

## 2016-09-07 DIAGNOSIS — D509 Iron deficiency anemia, unspecified: Secondary | ICD-10-CM | POA: Diagnosis not present

## 2016-09-07 DIAGNOSIS — T8571XD Infection and inflammatory reaction due to peritoneal dialysis catheter, subsequent encounter: Secondary | ICD-10-CM | POA: Diagnosis not present

## 2016-09-07 DIAGNOSIS — N2581 Secondary hyperparathyroidism of renal origin: Secondary | ICD-10-CM | POA: Diagnosis not present

## 2016-09-10 DIAGNOSIS — D509 Iron deficiency anemia, unspecified: Secondary | ICD-10-CM | POA: Diagnosis not present

## 2016-09-10 DIAGNOSIS — D631 Anemia in chronic kidney disease: Secondary | ICD-10-CM | POA: Diagnosis not present

## 2016-09-10 DIAGNOSIS — N2581 Secondary hyperparathyroidism of renal origin: Secondary | ICD-10-CM | POA: Diagnosis not present

## 2016-09-10 DIAGNOSIS — N186 End stage renal disease: Secondary | ICD-10-CM | POA: Diagnosis not present

## 2016-09-12 DIAGNOSIS — D631 Anemia in chronic kidney disease: Secondary | ICD-10-CM | POA: Diagnosis not present

## 2016-09-12 DIAGNOSIS — N186 End stage renal disease: Secondary | ICD-10-CM | POA: Diagnosis not present

## 2016-09-12 DIAGNOSIS — D509 Iron deficiency anemia, unspecified: Secondary | ICD-10-CM | POA: Diagnosis not present

## 2016-09-12 DIAGNOSIS — N2581 Secondary hyperparathyroidism of renal origin: Secondary | ICD-10-CM | POA: Diagnosis not present

## 2016-09-12 DIAGNOSIS — T8571XD Infection and inflammatory reaction due to peritoneal dialysis catheter, subsequent encounter: Secondary | ICD-10-CM | POA: Diagnosis not present

## 2016-09-12 DIAGNOSIS — B965 Pseudomonas (aeruginosa) (mallei) (pseudomallei) as the cause of diseases classified elsewhere: Secondary | ICD-10-CM | POA: Diagnosis not present

## 2016-09-14 DIAGNOSIS — D631 Anemia in chronic kidney disease: Secondary | ICD-10-CM | POA: Diagnosis not present

## 2016-09-14 DIAGNOSIS — D509 Iron deficiency anemia, unspecified: Secondary | ICD-10-CM | POA: Diagnosis not present

## 2016-09-14 DIAGNOSIS — N2581 Secondary hyperparathyroidism of renal origin: Secondary | ICD-10-CM | POA: Diagnosis not present

## 2016-09-14 DIAGNOSIS — N186 End stage renal disease: Secondary | ICD-10-CM | POA: Diagnosis not present

## 2016-09-17 DIAGNOSIS — N186 End stage renal disease: Secondary | ICD-10-CM | POA: Diagnosis not present

## 2016-09-17 DIAGNOSIS — D631 Anemia in chronic kidney disease: Secondary | ICD-10-CM | POA: Diagnosis not present

## 2016-09-17 DIAGNOSIS — N2581 Secondary hyperparathyroidism of renal origin: Secondary | ICD-10-CM | POA: Diagnosis not present

## 2016-09-17 DIAGNOSIS — D509 Iron deficiency anemia, unspecified: Secondary | ICD-10-CM | POA: Diagnosis not present

## 2016-09-19 DIAGNOSIS — D509 Iron deficiency anemia, unspecified: Secondary | ICD-10-CM | POA: Diagnosis not present

## 2016-09-19 DIAGNOSIS — N186 End stage renal disease: Secondary | ICD-10-CM | POA: Diagnosis not present

## 2016-09-19 DIAGNOSIS — D631 Anemia in chronic kidney disease: Secondary | ICD-10-CM | POA: Diagnosis not present

## 2016-09-19 DIAGNOSIS — N2581 Secondary hyperparathyroidism of renal origin: Secondary | ICD-10-CM | POA: Diagnosis not present

## 2016-09-21 ENCOUNTER — Encounter: Payer: Self-pay | Admitting: Physician Assistant

## 2016-09-21 ENCOUNTER — Ambulatory Visit (INDEPENDENT_AMBULATORY_CARE_PROVIDER_SITE_OTHER): Payer: Medicare Other | Admitting: Physician Assistant

## 2016-09-21 VITALS — BP 150/80 | HR 75 | Temp 96.9°F | Ht 68.0 in | Wt 131.0 lb

## 2016-09-21 DIAGNOSIS — D631 Anemia in chronic kidney disease: Secondary | ICD-10-CM | POA: Diagnosis not present

## 2016-09-21 DIAGNOSIS — H60391 Other infective otitis externa, right ear: Secondary | ICD-10-CM | POA: Diagnosis not present

## 2016-09-21 DIAGNOSIS — D509 Iron deficiency anemia, unspecified: Secondary | ICD-10-CM | POA: Diagnosis not present

## 2016-09-21 DIAGNOSIS — N186 End stage renal disease: Secondary | ICD-10-CM | POA: Diagnosis not present

## 2016-09-21 DIAGNOSIS — N2581 Secondary hyperparathyroidism of renal origin: Secondary | ICD-10-CM | POA: Diagnosis not present

## 2016-09-21 MED ORDER — CIPROFLOXACIN HCL 0.2 % OT SOLN: 0.2000 mL | Freq: Two times a day (BID) | OTIC | 0 refills | Status: DC
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
## 2016-09-21 NOTE — Patient Instructions (Signed)
In a few days you may receive a survey in the mail or online from [employer] regarding your visit with us today. Please take a moment to fill this out. Your feedback is very important to our whole office. It can help us better understand your needs as well as improve your experience and satisfaction. Thank you for taking your time to complete it. We care about you.  Nayelli Inglis, PA-C  

## 2016-09-24 DIAGNOSIS — N2581 Secondary hyperparathyroidism of renal origin: Secondary | ICD-10-CM | POA: Diagnosis not present

## 2016-09-24 DIAGNOSIS — D509 Iron deficiency anemia, unspecified: Secondary | ICD-10-CM | POA: Diagnosis not present

## 2016-09-24 DIAGNOSIS — D631 Anemia in chronic kidney disease: Secondary | ICD-10-CM | POA: Diagnosis not present

## 2016-09-24 DIAGNOSIS — N186 End stage renal disease: Secondary | ICD-10-CM | POA: Diagnosis not present

## 2016-09-26 DIAGNOSIS — N2581 Secondary hyperparathyroidism of renal origin: Secondary | ICD-10-CM | POA: Diagnosis not present

## 2016-09-26 DIAGNOSIS — D509 Iron deficiency anemia, unspecified: Secondary | ICD-10-CM | POA: Diagnosis not present

## 2016-09-26 DIAGNOSIS — D631 Anemia in chronic kidney disease: Secondary | ICD-10-CM | POA: Diagnosis not present

## 2016-09-26 DIAGNOSIS — N186 End stage renal disease: Secondary | ICD-10-CM | POA: Diagnosis not present

## 2016-09-27 ENCOUNTER — Ambulatory Visit (INDEPENDENT_AMBULATORY_CARE_PROVIDER_SITE_OTHER): Payer: Medicare Other | Admitting: Pediatrics

## 2016-09-27 DIAGNOSIS — Z602 Problems related to living alone: Secondary | ICD-10-CM | POA: Diagnosis not present

## 2016-09-27 DIAGNOSIS — R1312 Dysphagia, oropharyngeal phase: Secondary | ICD-10-CM

## 2016-09-27 DIAGNOSIS — F319 Bipolar disorder, unspecified: Secondary | ICD-10-CM | POA: Diagnosis not present

## 2016-09-27 DIAGNOSIS — I120 Hypertensive chronic kidney disease with stage 5 chronic kidney disease or end stage renal disease: Secondary | ICD-10-CM

## 2016-09-27 DIAGNOSIS — D631 Anemia in chronic kidney disease: Secondary | ICD-10-CM

## 2016-09-27 DIAGNOSIS — T8571XD Infection and inflammatory reaction due to peritoneal dialysis catheter, subsequent encounter: Secondary | ICD-10-CM

## 2016-09-27 DIAGNOSIS — B965 Pseudomonas (aeruginosa) (mallei) (pseudomallei) as the cause of diseases classified elsewhere: Secondary | ICD-10-CM

## 2016-09-27 DIAGNOSIS — Z992 Dependence on renal dialysis: Secondary | ICD-10-CM

## 2016-09-27 DIAGNOSIS — E871 Hypo-osmolality and hyponatremia: Secondary | ICD-10-CM | POA: Diagnosis not present

## 2016-09-27 DIAGNOSIS — N186 End stage renal disease: Secondary | ICD-10-CM | POA: Diagnosis not present

## 2016-09-28 DIAGNOSIS — Z992 Dependence on renal dialysis: Secondary | ICD-10-CM | POA: Diagnosis not present

## 2016-09-28 DIAGNOSIS — N2581 Secondary hyperparathyroidism of renal origin: Secondary | ICD-10-CM | POA: Diagnosis not present

## 2016-09-28 DIAGNOSIS — I7789 Other specified disorders of arteries and arterioles: Secondary | ICD-10-CM | POA: Diagnosis not present

## 2016-09-28 DIAGNOSIS — D631 Anemia in chronic kidney disease: Secondary | ICD-10-CM | POA: Diagnosis not present

## 2016-09-28 DIAGNOSIS — N186 End stage renal disease: Secondary | ICD-10-CM | POA: Diagnosis not present

## 2016-09-28 DIAGNOSIS — D509 Iron deficiency anemia, unspecified: Secondary | ICD-10-CM | POA: Diagnosis not present

## 2016-10-01 DIAGNOSIS — D509 Iron deficiency anemia, unspecified: Secondary | ICD-10-CM | POA: Diagnosis not present

## 2016-10-01 DIAGNOSIS — N2581 Secondary hyperparathyroidism of renal origin: Secondary | ICD-10-CM | POA: Diagnosis not present

## 2016-10-01 DIAGNOSIS — D631 Anemia in chronic kidney disease: Secondary | ICD-10-CM | POA: Diagnosis not present

## 2016-10-01 DIAGNOSIS — N186 End stage renal disease: Secondary | ICD-10-CM | POA: Diagnosis not present

## 2016-10-03 DIAGNOSIS — D509 Iron deficiency anemia, unspecified: Secondary | ICD-10-CM | POA: Diagnosis not present

## 2016-10-03 DIAGNOSIS — N2581 Secondary hyperparathyroidism of renal origin: Secondary | ICD-10-CM | POA: Diagnosis not present

## 2016-10-03 DIAGNOSIS — N186 End stage renal disease: Secondary | ICD-10-CM | POA: Diagnosis not present

## 2016-10-03 DIAGNOSIS — D631 Anemia in chronic kidney disease: Secondary | ICD-10-CM | POA: Diagnosis not present

## 2016-10-05 DIAGNOSIS — D509 Iron deficiency anemia, unspecified: Secondary | ICD-10-CM | POA: Diagnosis not present

## 2016-10-05 DIAGNOSIS — D631 Anemia in chronic kidney disease: Secondary | ICD-10-CM | POA: Diagnosis not present

## 2016-10-05 DIAGNOSIS — N2581 Secondary hyperparathyroidism of renal origin: Secondary | ICD-10-CM | POA: Diagnosis not present

## 2016-10-05 DIAGNOSIS — N186 End stage renal disease: Secondary | ICD-10-CM | POA: Diagnosis not present

## 2016-10-08 DIAGNOSIS — N186 End stage renal disease: Secondary | ICD-10-CM | POA: Diagnosis not present

## 2016-10-08 DIAGNOSIS — D631 Anemia in chronic kidney disease: Secondary | ICD-10-CM | POA: Diagnosis not present

## 2016-10-08 DIAGNOSIS — D509 Iron deficiency anemia, unspecified: Secondary | ICD-10-CM | POA: Diagnosis not present

## 2016-10-08 DIAGNOSIS — N2581 Secondary hyperparathyroidism of renal origin: Secondary | ICD-10-CM | POA: Diagnosis not present

## 2016-10-10 DIAGNOSIS — D509 Iron deficiency anemia, unspecified: Secondary | ICD-10-CM | POA: Diagnosis not present

## 2016-10-10 DIAGNOSIS — D631 Anemia in chronic kidney disease: Secondary | ICD-10-CM | POA: Diagnosis not present

## 2016-10-10 DIAGNOSIS — N186 End stage renal disease: Secondary | ICD-10-CM | POA: Diagnosis not present

## 2016-10-10 DIAGNOSIS — N2581 Secondary hyperparathyroidism of renal origin: Secondary | ICD-10-CM | POA: Diagnosis not present

## 2016-10-12 ENCOUNTER — Other Ambulatory Visit: Payer: Self-pay

## 2016-10-12 DIAGNOSIS — D631 Anemia in chronic kidney disease: Secondary | ICD-10-CM | POA: Diagnosis not present

## 2016-10-12 DIAGNOSIS — N186 End stage renal disease: Secondary | ICD-10-CM | POA: Diagnosis not present

## 2016-10-12 DIAGNOSIS — N2581 Secondary hyperparathyroidism of renal origin: Secondary | ICD-10-CM | POA: Diagnosis not present

## 2016-10-12 DIAGNOSIS — D509 Iron deficiency anemia, unspecified: Secondary | ICD-10-CM | POA: Diagnosis not present

## 2016-10-12 MED ORDER — LEVOTHYROXINE SODIUM 50 MCG PO TABS: 50.0000 ug | ORAL_TABLET | Freq: Every day | ORAL | 10 refills | Status: DC

## 2016-10-15 DIAGNOSIS — N2581 Secondary hyperparathyroidism of renal origin: Secondary | ICD-10-CM | POA: Diagnosis not present

## 2016-10-15 DIAGNOSIS — D509 Iron deficiency anemia, unspecified: Secondary | ICD-10-CM | POA: Diagnosis not present

## 2016-10-15 DIAGNOSIS — D631 Anemia in chronic kidney disease: Secondary | ICD-10-CM | POA: Diagnosis not present

## 2016-10-15 DIAGNOSIS — N186 End stage renal disease: Secondary | ICD-10-CM | POA: Diagnosis not present

## 2016-10-17 DIAGNOSIS — D509 Iron deficiency anemia, unspecified: Secondary | ICD-10-CM | POA: Diagnosis not present

## 2016-10-17 DIAGNOSIS — D631 Anemia in chronic kidney disease: Secondary | ICD-10-CM | POA: Diagnosis not present

## 2016-10-17 DIAGNOSIS — N2581 Secondary hyperparathyroidism of renal origin: Secondary | ICD-10-CM | POA: Diagnosis not present

## 2016-10-17 DIAGNOSIS — N186 End stage renal disease: Secondary | ICD-10-CM | POA: Diagnosis not present

## 2016-10-19 DIAGNOSIS — N2581 Secondary hyperparathyroidism of renal origin: Secondary | ICD-10-CM | POA: Diagnosis not present

## 2016-10-19 DIAGNOSIS — D631 Anemia in chronic kidney disease: Secondary | ICD-10-CM | POA: Diagnosis not present

## 2016-10-19 DIAGNOSIS — D509 Iron deficiency anemia, unspecified: Secondary | ICD-10-CM | POA: Diagnosis not present

## 2016-10-19 DIAGNOSIS — N186 End stage renal disease: Secondary | ICD-10-CM | POA: Diagnosis not present

## 2016-10-22 ENCOUNTER — Encounter: Payer: Self-pay | Admitting: *Deleted

## 2016-10-22 ENCOUNTER — Other Ambulatory Visit: Payer: Self-pay | Admitting: *Deleted

## 2016-10-22 ENCOUNTER — Ambulatory Visit (INDEPENDENT_AMBULATORY_CARE_PROVIDER_SITE_OTHER): Payer: Medicare Other | Admitting: Surgery

## 2016-10-22 ENCOUNTER — Encounter: Payer: Self-pay | Admitting: Surgery

## 2016-10-22 ENCOUNTER — Telehealth: Payer: Self-pay | Admitting: Family Medicine

## 2016-10-22 VITALS — BP 190/105 | HR 81 | Temp 97.1°F | Resp 20 | Ht 68.0 in | Wt 134.0 lb

## 2016-10-22 DIAGNOSIS — N2581 Secondary hyperparathyroidism of renal origin: Secondary | ICD-10-CM | POA: Diagnosis not present

## 2016-10-22 DIAGNOSIS — Z992 Dependence on renal dialysis: Secondary | ICD-10-CM

## 2016-10-22 DIAGNOSIS — N186 End stage renal disease: Secondary | ICD-10-CM | POA: Diagnosis not present

## 2016-10-22 DIAGNOSIS — D509 Iron deficiency anemia, unspecified: Secondary | ICD-10-CM | POA: Diagnosis not present

## 2016-10-22 DIAGNOSIS — D631 Anemia in chronic kidney disease: Secondary | ICD-10-CM | POA: Diagnosis not present
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
## 2016-10-22 NOTE — Telephone Encounter (Signed)
Pt. Needs to be seen for this. Thanks, WS 

## 2016-10-22 NOTE — Telephone Encounter (Signed)
Pt given an appt 10/2 at 3:10.

## 2016-10-22 NOTE — Telephone Encounter (Signed)
Pt is having continued elevated BP readings Wants RX for hypertension Please review and advise Pt would not come in for appt

## 2016-10-23 ENCOUNTER — Ambulatory Visit (HOSPITAL_COMMUNITY)
Admission: RE | Admit: 2016-10-23 | Discharge: 2016-10-23 | Disposition: A | Discharge status: Home / Self Care | Payer: Medicare Other | Source: Ambulatory Visit | Attending: Surgery | Admitting: Surgery

## 2016-10-23 ENCOUNTER — Telehealth: Payer: Self-pay | Admitting: Family Medicine

## 2016-10-23 ENCOUNTER — Encounter (HOSPITAL_COMMUNITY): Payer: Self-pay | Admitting: Surgery

## 2016-10-23 ENCOUNTER — Ambulatory Visit: Payer: Medicare Other

## 2016-10-23 ENCOUNTER — Encounter (HOSPITAL_COMMUNITY)
Admission: RE | Disposition: A | Discharge status: Home / Self Care | Payer: Self-pay | Source: Ambulatory Visit | Attending: Surgery

## 2016-10-23 DIAGNOSIS — Z882 Allergy status to sulfonamides status: Secondary | ICD-10-CM | POA: Insufficient documentation

## 2016-10-23 DIAGNOSIS — E079 Disorder of thyroid, unspecified: Secondary | ICD-10-CM | POA: Diagnosis not present

## 2016-10-23 DIAGNOSIS — N186 End stage renal disease: Secondary | ICD-10-CM | POA: Diagnosis not present

## 2016-10-23 DIAGNOSIS — Z992 Dependence on renal dialysis: Secondary | ICD-10-CM | POA: Diagnosis not present

## 2016-10-23 DIAGNOSIS — K900 Celiac disease: Secondary | ICD-10-CM | POA: Diagnosis not present

## 2016-10-23 DIAGNOSIS — F319 Bipolar disorder, unspecified: Secondary | ICD-10-CM | POA: Insufficient documentation

## 2016-10-23 HISTORY — PX: UPPER EXTREMITY VENOGRAPHY: CATH118272

## 2016-10-23 LAB — POCT I-STAT, CHEM 8
BUN: 55 mg/dL — ABNORMAL HIGH (ref 6–20)
CHLORIDE: 95 mmol/L — AB (ref 101–111)
Calcium, Ion: 1.17 mmol/L (ref 1.15–1.40)
Creatinine, Ser: 5.2 mg/dL — ABNORMAL HIGH (ref 0.61–1.24)
GLUCOSE: 79 mg/dL (ref 65–99)
HCT: 45 % (ref 39.0–52.0)
Hemoglobin: 15.3 g/dL (ref 13.0–17.0)
POTASSIUM: 5 mmol/L (ref 3.5–5.1)
Sodium: 135 mmol/L (ref 135–145)
TCO2: 32 mmol/L (ref 22–32)

## 2016-10-23 SURGERY — UPPER EXTREMITY VENOGRAPHY
Anesthesia: LOCAL

## 2016-10-23 MED ORDER — HEPARIN (PORCINE) IN NACL 2-0.9 UNIT/ML-% IJ SOLN
INTRAMUSCULAR | Status: AC | PRN
  Administered 2016-10-23: 500 mL

## 2016-10-23 MED ORDER — SODIUM CHLORIDE 0.9 % IV SOLN: 250.0000 mL | INTRAVENOUS | Status: DC | PRN

## 2016-10-23 MED ORDER — HEPARIN (PORCINE) IN NACL 2-0.9 UNIT/ML-% IJ SOLN
INTRAMUSCULAR | Status: AC
  Filled 2016-10-23: qty 500

## 2016-10-23 MED ORDER — SODIUM CHLORIDE 0.9% FLUSH: 3.0000 mL | INTRAVENOUS | Status: DC | PRN

## 2016-10-23 MED ORDER — SODIUM CHLORIDE 0.9% FLUSH: 3.0000 mL | Freq: Two times a day (BID) | INTRAVENOUS | Status: DC

## 2016-10-23 SURGICAL SUPPLY — 4 items
PROTECTION STATION PRESSURIZED (MISCELLANEOUS) ×2
STATION PROTECTION PRESSURIZED (MISCELLANEOUS) IMPLANT
STOPCOCK MORSE 400PSI 3WAY (MISCELLANEOUS) ×1 IMPLANT
TUBING CIL FLEX 10 FLL-RA (TUBING) ×1 IMPLANT

## 2016-10-23 NOTE — Discharge Instructions (Signed)
Venogram, Care After °This sheet gives you information about how to care for yourself after your procedure. Your health care provider may also give you more specific instructions. If you have problems or questions, contact your health care provider. °What can I expect after the procedure? °After the procedure, it is common to have: °· Bruising or mild discomfort in the area where the IV was inserted (insertion site). ° °Follow these instructions at home: °Eating and drinking °· Follow instructions from your health care provider about eating or drinking restrictions. °· Drink a lot of fluids for the first several days after the procedure, as directed by your health care provider. This helps to wash (flush) the contrast out of your body. Examples of healthy fluids include water or low-calorie drinks. °General instructions °· Check your IV insertion area every day for signs of infection. Check for: °? Redness, swelling, or pain. °? Fluid or blood. °? Warmth. °? Pus or a bad smell. °· Take over-the-counter and prescription medicines only as told by your health care provider. °· Rest and return to your normal activities as told by your health care provider. Ask your health care provider what activities are safe for you. °· Do not drive for 24 hours if you were given a medicine to help you relax (sedative), or until your health care provider approves. °· Keep all follow-up visits as told by your health care provider. This is important. °Contact a health care provider if: °· Your skin becomes itchy or you develop a rash or hives. °· You have a fever that does not get better with medicine. °· You feel nauseous. °· You vomit. °· You have redness, swelling, or pain around the insertion site. °· You have fluid or blood coming from the insertion site. °· Your insertion area feels warm to the touch. °· You have pus or a bad smell coming from the insertion site. °Get help right away if: °· You have difficulty breathing or  shortness of breath. °· You develop chest pain. °· You faint. °· You feel very dizzy. °These symptoms may represent a serious problem that is an emergency. Do not wait to see if the symptoms will go away. Get medical help right away. Call your local emergency services (911 in the [place] Do not drive yourself to the hospital. °Summary °· After your procedure, it is common to have bruising or mild discomfort in the area where the IV was inserted. °· You should check your IV insertion area every day for signs of infection. °· Take over-the-counter and prescription medicines only as told by your health care provider. °· You should drink a lot of fluids for the first several days after the procedure to help flush the contrast from your body. °This information is not intended to replace advice given to you by your health care provider. Make sure you discuss any questions you have with your health care provider. °Document Released: 11/05/2012 Document Revised: 12/10/2015 Document Reviewed: 12/10/2015 °Elsevier Interactive Patient Education © 2017 Elsevier Inc. ° °
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
## 2016-10-23 NOTE — Telephone Encounter (Signed)
Patient talked to triage

## 2016-10-23 NOTE — Op Note (Signed)
    Patient name: Darren Dunn MRN: [id number] DOB: 15-Apr-1945 Sex: male  10/23/2016 Pre-operative Diagnosis: ESRD Post-operative diagnosis:  Same Surgeon:  [name] Procedure Performed:  1.  Left arm venogram    Indications:  Patient is here for access planning  Procedure:  The patient was identified in the holding area and taken to room 8.  Contrast injections were performed through his IV  Findings:   No evidence of central vein stenosis   Intervention:  none  Impression:  #1  No central venous stenossi  #2  Pt to be scheduled for left upper arm AVGG    V. [name], M.D. [facility] Ve[facility] Specialists of [place] Office: [phone] Pager:  [phone]

## 2016-10-23 NOTE — Interval H&P Note (Signed)
History and Physical Interval Note:  10/23/2016 9:38 AM  Darren Dunn  has presented today for surgery, with the diagnosis of left are swelling - poor flow  The various methods of treatment have been discussed with the patient and family. After consideration of risks, benefits and other options for treatment, the patient has consented to  Procedure(s): Upper Extremity Venography - Left Arm and Central (N/A) as a surgical intervention .  The patient's history has been reviewed, patient examined, no change in status, stable for surgery.  I have reviewed the patient's chart and labs.  Questions were answered to the patient's satisfaction.     [name]

## 2016-10-23 NOTE — Progress Notes (Signed)
Dr Trula Slade in to see pt .  Son in ot take pt home.  Discharge instructions given to son and he was able to verbally recall the instructions.  Pt to car via wheelchair.

## 2016-10-24 DIAGNOSIS — N186 End stage renal disease: Secondary | ICD-10-CM | POA: Diagnosis not present

## 2016-10-24 DIAGNOSIS — D631 Anemia in chronic kidney disease: Secondary | ICD-10-CM | POA: Diagnosis not present

## 2016-10-24 DIAGNOSIS — N2581 Secondary hyperparathyroidism of renal origin: Secondary | ICD-10-CM | POA: Diagnosis not present

## 2016-10-24 DIAGNOSIS — D509 Iron deficiency anemia, unspecified: Secondary | ICD-10-CM | POA: Diagnosis not present

## 2016-10-26 ENCOUNTER — Encounter: Payer: Self-pay | Admitting: Family Medicine

## 2016-10-26 DIAGNOSIS — D631 Anemia in chronic kidney disease: Secondary | ICD-10-CM | POA: Diagnosis not present

## 2016-10-26 DIAGNOSIS — N2581 Secondary hyperparathyroidism of renal origin: Secondary | ICD-10-CM | POA: Diagnosis not present

## 2016-10-26 DIAGNOSIS — N186 End stage renal disease: Secondary | ICD-10-CM | POA: Diagnosis not present

## 2016-10-26 DIAGNOSIS — D509 Iron deficiency anemia, unspecified: Secondary | ICD-10-CM | POA: Diagnosis not present

## 2016-10-28 DIAGNOSIS — I7789 Other specified disorders of arteries and arterioles: Secondary | ICD-10-CM | POA: Diagnosis not present

## 2016-10-28 DIAGNOSIS — Z992 Dependence on renal dialysis: Secondary | ICD-10-CM | POA: Diagnosis not present

## 2016-10-28 DIAGNOSIS — N186 End stage renal disease: Secondary | ICD-10-CM | POA: Diagnosis not present

## 2016-10-29 DIAGNOSIS — N2581 Secondary hyperparathyroidism of renal origin: Secondary | ICD-10-CM | POA: Diagnosis not present

## 2016-10-29 DIAGNOSIS — D509 Iron deficiency anemia, unspecified: Secondary | ICD-10-CM | POA: Diagnosis not present

## 2016-10-29 DIAGNOSIS — N186 End stage renal disease: Secondary | ICD-10-CM | POA: Diagnosis not present

## 2016-10-29 DIAGNOSIS — Z23 Encounter for immunization: Secondary | ICD-10-CM | POA: Diagnosis not present

## 2016-10-29 DIAGNOSIS — D631 Anemia in chronic kidney disease: Secondary | ICD-10-CM | POA: Diagnosis not present

## 2016-10-30 ENCOUNTER — Ambulatory Visit: Payer: Medicare Other | Admitting: Family Medicine

## 2016-10-30 NOTE — Telephone Encounter (Signed)
I would not recommend the Losartan since it may interfere with the goals of dialysis. His kidney doctor will address his blood pressure. Please let him know your concerns.  Best Regards Claretta Fraise

## 2016-10-31 DIAGNOSIS — N2581 Secondary hyperparathyroidism of renal origin: Secondary | ICD-10-CM | POA: Diagnosis not present

## 2016-10-31 DIAGNOSIS — D631 Anemia in chronic kidney disease: Secondary | ICD-10-CM | POA: Diagnosis not present

## 2016-10-31 DIAGNOSIS — N186 End stage renal disease: Secondary | ICD-10-CM | POA: Diagnosis not present

## 2016-10-31 DIAGNOSIS — D509 Iron deficiency anemia, unspecified: Secondary | ICD-10-CM | POA: Diagnosis not present

## 2016-10-31 DIAGNOSIS — Z23 Encounter for immunization: Secondary | ICD-10-CM | POA: Diagnosis not present

## 2016-11-01 ENCOUNTER — Other Ambulatory Visit: Payer: Self-pay | Admitting: Family Medicine

## 2016-11-02 DIAGNOSIS — D509 Iron deficiency anemia, unspecified: Secondary | ICD-10-CM | POA: Diagnosis not present

## 2016-11-02 DIAGNOSIS — D631 Anemia in chronic kidney disease: Secondary | ICD-10-CM | POA: Diagnosis not present

## 2016-11-02 DIAGNOSIS — Z23 Encounter for immunization: Secondary | ICD-10-CM | POA: Diagnosis not present

## 2016-11-02 DIAGNOSIS — N2581 Secondary hyperparathyroidism of renal origin: Secondary | ICD-10-CM | POA: Diagnosis not present

## 2016-11-02 DIAGNOSIS — N186 End stage renal disease: Secondary | ICD-10-CM | POA: Diagnosis not present

## 2016-11-05 DIAGNOSIS — N186 End stage renal disease: Secondary | ICD-10-CM | POA: Diagnosis not present

## 2016-11-05 DIAGNOSIS — Z23 Encounter for immunization: Secondary | ICD-10-CM | POA: Diagnosis not present

## 2016-11-05 DIAGNOSIS — D631 Anemia in chronic kidney disease: Secondary | ICD-10-CM | POA: Diagnosis not present

## 2016-11-05 DIAGNOSIS — N2581 Secondary hyperparathyroidism of renal origin: Secondary | ICD-10-CM | POA: Diagnosis not present

## 2016-11-05 DIAGNOSIS — D509 Iron deficiency anemia, unspecified: Secondary | ICD-10-CM | POA: Diagnosis not present

## 2016-11-07 ENCOUNTER — Ambulatory Visit: Payer: Medicare Other | Admitting: Family Medicine

## 2016-11-07 ENCOUNTER — Encounter: Payer: Self-pay | Admitting: Family Medicine

## 2016-11-07 DIAGNOSIS — N2581 Secondary hyperparathyroidism of renal origin: Secondary | ICD-10-CM | POA: Diagnosis not present

## 2016-11-07 DIAGNOSIS — D509 Iron deficiency anemia, unspecified: Secondary | ICD-10-CM | POA: Diagnosis not present

## 2016-11-07 DIAGNOSIS — Z23 Encounter for immunization: Secondary | ICD-10-CM | POA: Diagnosis not present

## 2016-11-07 DIAGNOSIS — N186 End stage renal disease: Secondary | ICD-10-CM | POA: Diagnosis not present

## 2016-11-07 DIAGNOSIS — D631 Anemia in chronic kidney disease: Secondary | ICD-10-CM | POA: Diagnosis not present

## 2016-11-08 ENCOUNTER — Encounter: Payer: Self-pay | Admitting: Family

## 2016-11-08 ENCOUNTER — Ambulatory Visit (INDEPENDENT_AMBULATORY_CARE_PROVIDER_SITE_OTHER): Payer: Medicare Other | Admitting: Family

## 2016-11-08 ENCOUNTER — Ambulatory Visit (INDEPENDENT_AMBULATORY_CARE_PROVIDER_SITE_OTHER): Payer: Medicare Other

## 2016-11-08 VITALS — BP 178/99 | HR 91 | Temp 97.7°F | Ht 68.0 in

## 2016-11-08 DIAGNOSIS — M25571 Pain in right ankle and joints of right foot: Secondary | ICD-10-CM | POA: Diagnosis not present

## 2016-11-08 DIAGNOSIS — S93401A Sprain of unspecified ligament of right ankle, initial encounter: Secondary | ICD-10-CM

## 2016-11-08 NOTE — Progress Notes (Addendum)
   Subjective:    Patient ID: Darren Dunn, male    DOB: [birth year], 71 y.o.   MRN: [id number]  Ankle Injury   The incident occurred more than 1 week ago. The injury mechanism was a twisting injury. The pain is present in the right ankle. The quality of the pain is described as aching. The pain is at a severity of 2/10. The pain is mild. Pertinent negatives include no loss of motion, numbness or tingling. He reports no foreign bodies present. The symptoms are aggravated by weight bearing. He has tried rest for the symptoms. The treatment provided mild relief.      Review of Systems  Neurological: Negative for tingling and numbness.  All other systems reviewed and are negative.      Objective:   Physical Exam  Constitutional: He is oriented to person, place, and time. He appears well-developed and well-nourished. No distress.  HENT:  Head: Normocephalic.  Right Ear: External ear normal.  Left Ear: External ear normal.  Eyes: Pupils are equal, round, and reactive to light. Right eye exhibits no discharge. Left eye exhibits no discharge.  Cardiovascular: Normal rate, regular rhythm, normal heart sounds and intact distal pulses.   No murmur heard. Pulmonary/Chest: Effort normal and breath sounds normal. No respiratory distress. He has no wheezes.  Abdominal: Soft. Bowel sounds are normal. He exhibits no distension. There is no tenderness.  Musculoskeletal: He exhibits edema (3+ right ankle swelling and tenderness with flexion or extension) and tenderness.  Neurological: He is alert and oriented to person, place, and time.  Skin: Skin is warm and dry. No rash noted. No erythema.  Psychiatric: He has a normal mood and affect. His behavior is normal. Judgment and thought content normal.  Vitals reviewed.  X-ray- Negative Preliminary reading by Evelina Dun, FNP [facility]   BP (!) 178/99   Pulse 91   Temp 97.7 F (36.5 C) (Oral)   Ht 5\' 8"  (1.727 m)      Assessment & Plan:  1.  Acute right ankle pain - DG Ankle Complete Right; Future  2. Sprain of right ankle, unspecified ligament, initial encounter  Rest Ice  Long discussion about fall precautions  Ice Compression RTO prn   Evelina Dun, FNP

## 2016-11-08 NOTE — Patient Instructions (Signed)
Ankle Sprain An ankle sprain is a stretch or tear in one of the tough, fiber-like tissues (ligaments) in the ankle. The ligaments in your ankle help to hold the bones of the ankle together. What are the causes? This condition is often caused by stepping on or falling on the outer edge of the foot. What increases the risk? This condition is more likely to develop in people who play sports. What are the signs or symptoms? Symptoms of this condition include:  Pain in your ankle.  Swelling.  Bruising. Bruising may develop right after you sprain your ankle or 1-2 days later.  Trouble standing or walking, especially when you turn or change directions.  How is this diagnosed? This condition is diagnosed with a physical exam. During the exam, your health care provider will press on certain parts of your foot and ankle and try to move them in certain ways. X-rays may be taken to see how severe the sprain is and to check for broken bones. How is this treated? This condition may be treated with:  A brace. This is used to keep the ankle from moving until it heals.  An elastic bandage. This is used to support the ankle.  Crutches.  Pain medicine.  Surgery. This may be needed if the sprain is severe.  Physical therapy. This may help to improve the range of motion in the ankle.  Follow these instructions at home:  Rest your ankle.  Take over-the-counter and prescription medicines only as told by your health care provider.  For 2-3 days, keep your ankle raised (elevated) above the level of your heart as much as possible.  If directed, apply ice to the area: ? Put ice in a plastic bag. ? Place a towel between your skin and the bag. ? Leave the ice on for 20 minutes, 2-3 times a day.  If you were given a brace: ? Wear it as directed. ? Remove it to shower or bathe. ? Try not to move your ankle much, but wiggle your toes from time to time. This helps to prevent swelling.  If you were  given an elastic bandage (dressing): ? Remove it to shower or bathe. ? Try not to move your ankle much, but wiggle your toes from time to time. This helps to prevent swelling. ? Adjust the dressing to make it more comfortable if it feels too tight. ? Loosen the dressing if you have numbness or tingling in your foot, or if your foot becomes cold and blue.  If you have crutches, use them as told by your health care provider. Continue to use them until you can walk without feeling pain in your ankle. Contact a health care provider if:  You have rapidly increasing bruising or swelling.  Your pain is not relieved with medicine. Get help right away if:  Your toes or foot becomes numb or blue.  You have severe pain that gets worse. This information is not intended to replace advice given to you by your health care provider. Make sure you discuss any questions you have with your health care provider. Document Released: 01/15/2005 Document Revised: 05/25/2015 Document Reviewed: 08/17/2014 Elsevier Interactive Patient Education  2017 [employer].

## 2016-11-09 DIAGNOSIS — Z23 Encounter for immunization: Secondary | ICD-10-CM | POA: Diagnosis not present

## 2016-11-09 DIAGNOSIS — N2581 Secondary hyperparathyroidism of renal origin: Secondary | ICD-10-CM | POA: Diagnosis not present

## 2016-11-09 DIAGNOSIS — N186 End stage renal disease: Secondary | ICD-10-CM | POA: Diagnosis not present

## 2016-11-09 DIAGNOSIS — D509 Iron deficiency anemia, unspecified: Secondary | ICD-10-CM | POA: Diagnosis not present

## 2016-11-09 DIAGNOSIS — D631 Anemia in chronic kidney disease: Secondary | ICD-10-CM | POA: Diagnosis not present

## 2016-11-12 DIAGNOSIS — Z23 Encounter for immunization: Secondary | ICD-10-CM | POA: Diagnosis not present

## 2016-11-12 DIAGNOSIS — D509 Iron deficiency anemia, unspecified: Secondary | ICD-10-CM | POA: Diagnosis not present

## 2016-11-12 DIAGNOSIS — D631 Anemia in chronic kidney disease: Secondary | ICD-10-CM | POA: Diagnosis not present

## 2016-11-12 DIAGNOSIS — N186 End stage renal disease: Secondary | ICD-10-CM | POA: Diagnosis not present

## 2016-11-12 DIAGNOSIS — N2581 Secondary hyperparathyroidism of renal origin: Secondary | ICD-10-CM | POA: Diagnosis not present

## 2016-11-14 ENCOUNTER — Telehealth: Payer: Self-pay | Admitting: *Deleted

## 2016-11-14 DIAGNOSIS — N186 End stage renal disease: Secondary | ICD-10-CM | POA: Diagnosis not present

## 2016-11-14 DIAGNOSIS — D509 Iron deficiency anemia, unspecified: Secondary | ICD-10-CM | POA: Diagnosis not present

## 2016-11-14 DIAGNOSIS — Z23 Encounter for immunization: Secondary | ICD-10-CM | POA: Diagnosis not present

## 2016-11-14 DIAGNOSIS — N2581 Secondary hyperparathyroidism of renal origin: Secondary | ICD-10-CM | POA: Diagnosis not present

## 2016-11-14 DIAGNOSIS — D631 Anemia in chronic kidney disease: Secondary | ICD-10-CM | POA: Diagnosis not present

## 2016-11-14 NOTE — Telephone Encounter (Signed)
Pt called to get a prescription for a wheelchair I did inform the patient that he may need another visit to discuss just the need for a wheelchair Please advise

## 2016-11-15 NOTE — Telephone Encounter (Signed)
Will write pt rx for wheelchair. Insurance may not approve and will need to see face to face

## 2016-11-15 NOTE — Telephone Encounter (Signed)
Patient aware script for wheelchair is ready.Marland Kitchen

## 2016-11-16 DIAGNOSIS — N2581 Secondary hyperparathyroidism of renal origin: Secondary | ICD-10-CM | POA: Diagnosis not present

## 2016-11-16 DIAGNOSIS — D509 Iron deficiency anemia, unspecified: Secondary | ICD-10-CM | POA: Diagnosis not present

## 2016-11-16 DIAGNOSIS — D631 Anemia in chronic kidney disease: Secondary | ICD-10-CM | POA: Diagnosis not present

## 2016-11-16 DIAGNOSIS — N186 End stage renal disease: Secondary | ICD-10-CM | POA: Diagnosis not present

## 2016-11-16 DIAGNOSIS — Z23 Encounter for immunization: Secondary | ICD-10-CM | POA: Diagnosis not present

## 2016-11-19 DIAGNOSIS — Z23 Encounter for immunization: Secondary | ICD-10-CM | POA: Diagnosis not present

## 2016-11-19 DIAGNOSIS — N2581 Secondary hyperparathyroidism of renal origin: Secondary | ICD-10-CM | POA: Diagnosis not present

## 2016-11-19 DIAGNOSIS — N186 End stage renal disease: Secondary | ICD-10-CM | POA: Diagnosis not present

## 2016-11-19 DIAGNOSIS — D631 Anemia in chronic kidney disease: Secondary | ICD-10-CM | POA: Diagnosis not present

## 2016-11-19 DIAGNOSIS — D509 Iron deficiency anemia, unspecified: Secondary | ICD-10-CM | POA: Diagnosis not present

## 2016-11-21 ENCOUNTER — Other Ambulatory Visit (HOSPITAL_COMMUNITY): Payer: Self-pay | Admitting: Nephrology

## 2016-11-21 DIAGNOSIS — M7989 Other specified soft tissue disorders: Secondary | ICD-10-CM

## 2016-11-21 DIAGNOSIS — N2581 Secondary hyperparathyroidism of renal origin: Secondary | ICD-10-CM | POA: Diagnosis not present

## 2016-11-21 DIAGNOSIS — N186 End stage renal disease: Secondary | ICD-10-CM | POA: Diagnosis not present

## 2016-11-21 DIAGNOSIS — D509 Iron deficiency anemia, unspecified: Secondary | ICD-10-CM | POA: Diagnosis not present

## 2016-11-21 DIAGNOSIS — Z23 Encounter for immunization: Secondary | ICD-10-CM | POA: Diagnosis not present

## 2016-11-21 DIAGNOSIS — D631 Anemia in chronic kidney disease: Secondary | ICD-10-CM | POA: Diagnosis not present

## 2016-11-22 ENCOUNTER — Inpatient Hospital Stay (HOSPITAL_COMMUNITY): Admission: RE | Admit: 2016-11-22 | Payer: Medicare Other | Source: Ambulatory Visit

## 2016-11-23 DIAGNOSIS — Z23 Encounter for immunization: Secondary | ICD-10-CM | POA: Diagnosis not present

## 2016-11-23 DIAGNOSIS — D631 Anemia in chronic kidney disease: Secondary | ICD-10-CM | POA: Diagnosis not present

## 2016-11-23 DIAGNOSIS — D509 Iron deficiency anemia, unspecified: Secondary | ICD-10-CM | POA: Diagnosis not present

## 2016-11-23 DIAGNOSIS — N2581 Secondary hyperparathyroidism of renal origin: Secondary | ICD-10-CM | POA: Diagnosis not present

## 2016-11-23 DIAGNOSIS — N186 End stage renal disease: Secondary | ICD-10-CM | POA: Diagnosis not present

## 2016-11-26 DIAGNOSIS — D509 Iron deficiency anemia, unspecified: Secondary | ICD-10-CM | POA: Diagnosis not present

## 2016-11-26 DIAGNOSIS — D631 Anemia in chronic kidney disease: Secondary | ICD-10-CM | POA: Diagnosis not present

## 2016-11-26 DIAGNOSIS — N2581 Secondary hyperparathyroidism of renal origin: Secondary | ICD-10-CM | POA: Diagnosis not present

## 2016-11-26 DIAGNOSIS — N186 End stage renal disease: Secondary | ICD-10-CM | POA: Diagnosis not present

## 2016-11-26 DIAGNOSIS — Z23 Encounter for immunization: Secondary | ICD-10-CM | POA: Diagnosis not present

## 2016-11-27 ENCOUNTER — Ambulatory Visit (HOSPITAL_COMMUNITY)
Admission: RE | Admit: 2016-11-27 | Discharge: 2016-11-27 | Disposition: A | Discharge status: Home / Self Care | Payer: Medicare Other | Source: Ambulatory Visit | Attending: Nephrology | Admitting: Nephrology

## 2016-11-27 DIAGNOSIS — M7989 Other specified soft tissue disorders: Secondary | ICD-10-CM | POA: Insufficient documentation

## 2016-11-28 DIAGNOSIS — D631 Anemia in chronic kidney disease: Secondary | ICD-10-CM | POA: Diagnosis not present

## 2016-11-28 DIAGNOSIS — I7789 Other specified disorders of arteries and arterioles: Secondary | ICD-10-CM | POA: Diagnosis not present

## 2016-11-28 DIAGNOSIS — N2581 Secondary hyperparathyroidism of renal origin: Secondary | ICD-10-CM | POA: Diagnosis not present

## 2016-11-28 DIAGNOSIS — Z23 Encounter for immunization: Secondary | ICD-10-CM | POA: Diagnosis not present

## 2016-11-28 DIAGNOSIS — N186 End stage renal disease: Secondary | ICD-10-CM | POA: Diagnosis not present

## 2016-11-28 DIAGNOSIS — Z992 Dependence on renal dialysis: Secondary | ICD-10-CM | POA: Diagnosis not present

## 2016-11-28 DIAGNOSIS — D509 Iron deficiency anemia, unspecified: Secondary | ICD-10-CM | POA: Diagnosis not present

## 2016-11-30 DIAGNOSIS — N2581 Secondary hyperparathyroidism of renal origin: Secondary | ICD-10-CM | POA: Diagnosis not present

## 2016-11-30 DIAGNOSIS — N186 End stage renal disease: Secondary | ICD-10-CM | POA: Diagnosis not present

## 2016-11-30 DIAGNOSIS — D509 Iron deficiency anemia, unspecified: Secondary | ICD-10-CM | POA: Diagnosis not present

## 2016-11-30 DIAGNOSIS — D631 Anemia in chronic kidney disease: Secondary | ICD-10-CM | POA: Diagnosis not present

## 2016-12-03 DIAGNOSIS — N2581 Secondary hyperparathyroidism of renal origin: Secondary | ICD-10-CM | POA: Diagnosis not present

## 2016-12-03 DIAGNOSIS — N186 End stage renal disease: Secondary | ICD-10-CM | POA: Diagnosis not present

## 2016-12-03 DIAGNOSIS — D631 Anemia in chronic kidney disease: Secondary | ICD-10-CM | POA: Diagnosis not present

## 2016-12-03 DIAGNOSIS — D509 Iron deficiency anemia, unspecified: Secondary | ICD-10-CM | POA: Diagnosis not present

## 2016-12-05 DIAGNOSIS — N2581 Secondary hyperparathyroidism of renal origin: Secondary | ICD-10-CM | POA: Diagnosis not present

## 2016-12-05 DIAGNOSIS — D631 Anemia in chronic kidney disease: Secondary | ICD-10-CM | POA: Diagnosis not present

## 2016-12-05 DIAGNOSIS — N186 End stage renal disease: Secondary | ICD-10-CM | POA: Diagnosis not present

## 2016-12-05 DIAGNOSIS — D509 Iron deficiency anemia, unspecified: Secondary | ICD-10-CM | POA: Diagnosis not present

## 2016-12-07 DIAGNOSIS — N2581 Secondary hyperparathyroidism of renal origin: Secondary | ICD-10-CM | POA: Diagnosis not present

## 2016-12-07 DIAGNOSIS — D509 Iron deficiency anemia, unspecified: Secondary | ICD-10-CM | POA: Diagnosis not present

## 2016-12-07 DIAGNOSIS — N186 End stage renal disease: Secondary | ICD-10-CM | POA: Diagnosis not present

## 2016-12-07 DIAGNOSIS — D631 Anemia in chronic kidney disease: Secondary | ICD-10-CM | POA: Diagnosis not present

## 2016-12-10 DIAGNOSIS — N2581 Secondary hyperparathyroidism of renal origin: Secondary | ICD-10-CM | POA: Diagnosis not present

## 2016-12-10 DIAGNOSIS — D631 Anemia in chronic kidney disease: Secondary | ICD-10-CM | POA: Diagnosis not present

## 2016-12-10 DIAGNOSIS — N186 End stage renal disease: Secondary | ICD-10-CM | POA: Diagnosis not present

## 2016-12-10 DIAGNOSIS — D509 Iron deficiency anemia, unspecified: Secondary | ICD-10-CM | POA: Diagnosis not present

## 2016-12-12 DIAGNOSIS — D509 Iron deficiency anemia, unspecified: Secondary | ICD-10-CM | POA: Diagnosis not present

## 2016-12-12 DIAGNOSIS — D631 Anemia in chronic kidney disease: Secondary | ICD-10-CM | POA: Diagnosis not present

## 2016-12-12 DIAGNOSIS — N2581 Secondary hyperparathyroidism of renal origin: Secondary | ICD-10-CM | POA: Diagnosis not present

## 2016-12-12 DIAGNOSIS — N186 End stage renal disease: Secondary | ICD-10-CM | POA: Diagnosis not present

## 2016-12-14 ENCOUNTER — Telehealth: Payer: Self-pay | Admitting: *Deleted

## 2016-12-14 DIAGNOSIS — N186 End stage renal disease: Secondary | ICD-10-CM | POA: Diagnosis not present

## 2016-12-14 DIAGNOSIS — D509 Iron deficiency anemia, unspecified: Secondary | ICD-10-CM | POA: Diagnosis not present

## 2016-12-14 DIAGNOSIS — D631 Anemia in chronic kidney disease: Secondary | ICD-10-CM | POA: Diagnosis not present

## 2016-12-14 DIAGNOSIS — N2581 Secondary hyperparathyroidism of renal origin: Secondary | ICD-10-CM | POA: Diagnosis not present

## 2016-12-14 NOTE — Telephone Encounter (Signed)
Call to patient's son [name]. Told him I would ask Dr. Trula Slade (11/19)  if patient need to keep the 12/10 appt or not. Son states that the surgery has been discussed and they are ready to proceed.

## 2016-12-14 NOTE — Telephone Encounter (Signed)
-----   Message from Rufina Falco sent at 12/13/2016  2:43 PM EST ----- Regarding: Is office visit needed Pt son called regarding the appointment schedule in the office 12/2016.  He is questioning if the office visit is really needed or can this be bypassed?  The patient is in need of a left arm avgg.  Please call [name] (son) at [phone]  [facility]

## 2016-12-16 DIAGNOSIS — N186 End stage renal disease: Secondary | ICD-10-CM | POA: Diagnosis not present

## 2016-12-16 DIAGNOSIS — D631 Anemia in chronic kidney disease: Secondary | ICD-10-CM | POA: Diagnosis not present

## 2016-12-16 DIAGNOSIS — N2581 Secondary hyperparathyroidism of renal origin: Secondary | ICD-10-CM | POA: Diagnosis not present

## 2016-12-16 DIAGNOSIS — D509 Iron deficiency anemia, unspecified: Secondary | ICD-10-CM | POA: Diagnosis not present

## 2016-12-17 ENCOUNTER — Telehealth: Payer: Self-pay | Admitting: *Deleted

## 2016-12-17 ENCOUNTER — Other Ambulatory Visit: Payer: Self-pay | Admitting: *Deleted

## 2016-12-17 NOTE — Telephone Encounter (Signed)
May cancel office appointment on 01/07/17 and go ahead and schedule surgery for access for dialysis per Dr. Trula Slade. Called Son(per his request)  and reviewed patient's medical history and instructed I would call him back with details reg. Surgery schedule.

## 2016-12-17 NOTE — Telephone Encounter (Signed)
Spoke to patient's son [name]. Instructed NPO past MN for surgery on 12/27/16 to be at [facility] @5 :30 am. Follow the detailed instructions from the [facility] Pre- Admission testing Department about this surgery. Must have driver for home and someone with him that evening. Verbalizes understanding.

## 2016-12-18 DIAGNOSIS — N186 End stage renal disease: Secondary | ICD-10-CM | POA: Diagnosis not present

## 2016-12-18 DIAGNOSIS — N2581 Secondary hyperparathyroidism of renal origin: Secondary | ICD-10-CM | POA: Diagnosis not present

## 2016-12-18 DIAGNOSIS — D631 Anemia in chronic kidney disease: Secondary | ICD-10-CM | POA: Diagnosis not present

## 2016-12-18 DIAGNOSIS — D509 Iron deficiency anemia, unspecified: Secondary | ICD-10-CM | POA: Diagnosis not present

## 2016-12-21 DIAGNOSIS — N2581 Secondary hyperparathyroidism of renal origin: Secondary | ICD-10-CM | POA: Diagnosis not present

## 2016-12-21 DIAGNOSIS — D509 Iron deficiency anemia, unspecified: Secondary | ICD-10-CM | POA: Diagnosis not present

## 2016-12-21 DIAGNOSIS — D631 Anemia in chronic kidney disease: Secondary | ICD-10-CM | POA: Diagnosis not present

## 2016-12-21 DIAGNOSIS — N186 End stage renal disease: Secondary | ICD-10-CM | POA: Diagnosis not present

## 2016-12-24 DIAGNOSIS — N186 End stage renal disease: Secondary | ICD-10-CM | POA: Diagnosis not present

## 2016-12-24 DIAGNOSIS — D509 Iron deficiency anemia, unspecified: Secondary | ICD-10-CM | POA: Diagnosis not present

## 2016-12-24 DIAGNOSIS — N2581 Secondary hyperparathyroidism of renal origin: Secondary | ICD-10-CM | POA: Diagnosis not present

## 2016-12-24 DIAGNOSIS — D631 Anemia in chronic kidney disease: Secondary | ICD-10-CM | POA: Diagnosis not present

## 2016-12-26 ENCOUNTER — Other Ambulatory Visit: Payer: Self-pay

## 2016-12-26 ENCOUNTER — Encounter (HOSPITAL_COMMUNITY): Payer: Self-pay | Admitting: *Deleted

## 2016-12-26 DIAGNOSIS — D631 Anemia in chronic kidney disease: Secondary | ICD-10-CM | POA: Diagnosis not present

## 2016-12-26 DIAGNOSIS — N186 End stage renal disease: Secondary | ICD-10-CM | POA: Diagnosis not present

## 2016-12-26 DIAGNOSIS — D509 Iron deficiency anemia, unspecified: Secondary | ICD-10-CM | POA: Diagnosis not present

## 2016-12-26 DIAGNOSIS — N2581 Secondary hyperparathyroidism of renal origin: Secondary | ICD-10-CM | POA: Diagnosis not present

## 2016-12-26 NOTE — Progress Notes (Signed)
Pt SDW-pre-op call completed by pt son, [name] (DPR). Son denies that pt C/O SOB and chest pain. Son denies that pt is under the care of a cardiologist. Son denies pt had a stress test and cardiac cath. Son made aware to have pt stop taking vitamins, fish oil, and herbal medications. Do not take any NSAIDs ie: Ibuprofen, Advil, Naproxen (Aleve), Motrin, BC and Goody Powder. Son verbalized understanding of all pre-op instructions.

## 2016-12-27 ENCOUNTER — Ambulatory Visit (HOSPITAL_COMMUNITY)
Admission: RE | Admit: 2016-12-27 | Discharge: 2016-12-27 | Disposition: A | Discharge status: Home / Self Care | Payer: Medicare Other | Source: Ambulatory Visit | Attending: Surgery | Admitting: Surgery

## 2016-12-27 ENCOUNTER — Ambulatory Visit (HOSPITAL_COMMUNITY): Payer: Medicare Other | Admitting: Anesthesiology

## 2016-12-27 ENCOUNTER — Encounter (HOSPITAL_COMMUNITY)
Admission: RE | Disposition: A | Discharge status: Home / Self Care | Payer: Self-pay | Source: Ambulatory Visit | Attending: Surgery

## 2016-12-27 ENCOUNTER — Telehealth: Payer: Self-pay | Admitting: Surgery

## 2016-12-27 ENCOUNTER — Encounter (HOSPITAL_COMMUNITY): Payer: Self-pay | Admitting: *Deleted

## 2016-12-27 DIAGNOSIS — N186 End stage renal disease: Secondary | ICD-10-CM | POA: Insufficient documentation

## 2016-12-27 DIAGNOSIS — Z992 Dependence on renal dialysis: Secondary | ICD-10-CM | POA: Diagnosis not present

## 2016-12-27 DIAGNOSIS — E039 Hypothyroidism, unspecified: Secondary | ICD-10-CM | POA: Insufficient documentation

## 2016-12-27 DIAGNOSIS — J189 Pneumonia, unspecified organism: Secondary | ICD-10-CM | POA: Diagnosis not present

## 2016-12-27 DIAGNOSIS — F319 Bipolar disorder, unspecified: Secondary | ICD-10-CM | POA: Diagnosis not present

## 2016-12-27 DIAGNOSIS — I120 Hypertensive chronic kidney disease with stage 5 chronic kidney disease or end stage renal disease: Secondary | ICD-10-CM | POA: Insufficient documentation

## 2016-12-27 HISTORY — DX: Presence of external hearing-aid: Z97.4

## 2016-12-27 HISTORY — DX: Pneumonia, unspecified organism: J18.9

## 2016-12-27 HISTORY — PX: AV FISTULA PLACEMENT: SHX1204

## 2016-12-27 LAB — POCT I-STAT 4, (NA,K, GLUC, HGB,HCT)
GLUCOSE: 81 mg/dL (ref 65–99)
HEMATOCRIT: 31 % — AB (ref 39.0–52.0)
Hemoglobin: 10.5 g/dL — ABNORMAL LOW (ref 13.0–17.0)
Potassium: 3.8 mmol/L (ref 3.5–5.1)
Sodium: 138 mmol/L (ref 135–145)

## 2016-12-27 SURGERY — INSERTION OF ARTERIOVENOUS (AV) GORE-TEX GRAFT ARM
Anesthesia: Monitor Anesthesia Care | Site: Arm Upper | Laterality: Left

## 2016-12-27 MED ORDER — PROTAMINE SULFATE 10 MG/ML IV SOLN
INTRAVENOUS | Status: DC | PRN
  Administered 2016-12-27: 50 mg via INTRAVENOUS

## 2016-12-27 MED ORDER — HEPARIN SODIUM (PORCINE) 1000 UNIT/ML IJ SOLN
INTRAMUSCULAR | Status: AC
  Filled 2016-12-27: qty 1

## 2016-12-27 MED ORDER — SODIUM CHLORIDE 0.9 % IV SOLN
INTRAVENOUS | Status: DC
  Administered 2016-12-27: 07:00:00 via INTRAVENOUS

## 2016-12-27 MED ORDER — HEPARIN SODIUM (PORCINE) 1000 UNIT/ML IJ SOLN
INTRAMUSCULAR | Status: DC | PRN
  Administered 2016-12-27: 4000 [IU] via INTRAVENOUS

## 2016-12-27 MED ORDER — DEXTROSE 5 % IV SOLN
1.5000 g | INTRAVENOUS | Status: AC
  Administered 2016-12-27: 1.5 g via INTRAVENOUS

## 2016-12-27 MED ORDER — EPHEDRINE SULFATE 50 MG/ML IJ SOLN
INTRAMUSCULAR | Status: DC | PRN
  Administered 2016-12-27 (×2): 5 mg via INTRAVENOUS

## 2016-12-27 MED ORDER — 0.9 % SODIUM CHLORIDE (POUR BTL) OPTIME
TOPICAL | Status: DC | PRN
  Administered 2016-12-27: 1000 mL

## 2016-12-27 MED ORDER — LIDOCAINE-EPINEPHRINE (PF) 1 %-1:200000 IJ SOLN
INTRAMUSCULAR | Status: DC | PRN
  Administered 2016-12-27: 20 mL

## 2016-12-27 MED ORDER — LIDOCAINE 2% (20 MG/ML) 5 ML SYRINGE
INTRAMUSCULAR | Status: AC
  Filled 2016-12-27: qty 5

## 2016-12-27 MED ORDER — EPHEDRINE 5 MG/ML INJ
INTRAVENOUS | Status: AC
  Filled 2016-12-27: qty 10

## 2016-12-27 MED ORDER — MIDAZOLAM HCL 2 MG/2ML IJ SOLN
INTRAMUSCULAR | Status: AC
  Filled 2016-12-27: qty 2

## 2016-12-27 MED ORDER — MIDAZOLAM HCL 5 MG/5ML IJ SOLN
INTRAMUSCULAR | Status: DC | PRN
  Administered 2016-12-27 (×2): 0.5 mg via INTRAVENOUS

## 2016-12-27 MED ORDER — PHENYLEPHRINE 40 MCG/ML (10ML) SYRINGE FOR IV PUSH (FOR BLOOD PRESSURE SUPPORT)
PREFILLED_SYRINGE | INTRAVENOUS | Status: AC
  Filled 2016-12-27: qty 10

## 2016-12-27 MED ORDER — FENTANYL CITRATE (PF) 100 MCG/2ML IJ SOLN
INTRAMUSCULAR | Status: DC | PRN
  Administered 2016-12-27: 50 ug via INTRAVENOUS
  Administered 2016-12-27 (×4): 25 ug via INTRAVENOUS

## 2016-12-27 MED ORDER — PROPOFOL 10 MG/ML IV BOLUS
INTRAVENOUS | Status: AC
  Filled 2016-12-27: qty 40

## 2016-12-27 MED ORDER — PHENYLEPHRINE HCL 10 MG/ML IJ SOLN
INTRAVENOUS | Status: DC | PRN
  Administered 2016-12-27: 20 ug/min via INTRAVENOUS

## 2016-12-27 MED ORDER — SODIUM CHLORIDE 0.9 % IV SOLN
INTRAVENOUS | Status: DC | PRN
  Administered 2016-12-27: 09:00:00 500 mL

## 2016-12-27 MED ORDER — PHENYLEPHRINE HCL 10 MG/ML IJ SOLN
INTRAMUSCULAR | Status: DC | PRN
  Administered 2016-12-27 (×2): 40 ug via INTRAVENOUS
  Administered 2016-12-27: 80 ug via INTRAVENOUS
  Administered 2016-12-27 (×2): 40 ug via INTRAVENOUS

## 2016-12-27 MED ORDER — ONDANSETRON HCL 4 MG/2ML IJ SOLN
INTRAMUSCULAR | Status: AC
  Filled 2016-12-27: qty 2

## 2016-12-27 MED ORDER — DEXTROSE 5 % IV SOLN
INTRAVENOUS | Status: AC
  Filled 2016-12-27: qty 1.5

## 2016-12-27 MED ORDER — OXYCODONE-ACETAMINOPHEN 5-325 MG PO TABS: 1.0000 | ORAL_TABLET | ORAL | 0 refills | Status: AC | PRN

## 2016-12-27 MED ORDER — ONDANSETRON HCL 4 MG/2ML IJ SOLN
INTRAMUSCULAR | Status: DC | PRN
  Administered 2016-12-27: 4 mg via INTRAVENOUS

## 2016-12-27 MED ORDER — FENTANYL CITRATE (PF) 250 MCG/5ML IJ SOLN
INTRAMUSCULAR | Status: AC
  Filled 2016-12-27: qty 5

## 2016-12-27 MED ORDER — LIDOCAINE HCL (CARDIAC) 20 MG/ML IV SOLN
INTRAVENOUS | Status: DC | PRN
  Administered 2016-12-27: 40 mg via INTRAVENOUS

## 2016-12-27 MED ORDER — PROPOFOL 500 MG/50ML IV EMUL
INTRAVENOUS | Status: DC | PRN
  Administered 2016-12-27: 30 ug/kg/min via INTRAVENOUS

## 2016-12-27 MED ORDER — PROTAMINE SULFATE 10 MG/ML IV SOLN
INTRAVENOUS | Status: AC
  Filled 2016-12-27: qty 5

## 2016-12-27 MED ORDER — LIDOCAINE-EPINEPHRINE (PF) 1 %-1:200000 IJ SOLN
INTRAMUSCULAR | Status: AC
  Filled 2016-12-27: qty 30

## 2016-12-27 SURGICAL SUPPLY — 31 items
ADH SKN CLS APL DERMABOND .7 (GAUZE/BANDAGES/DRESSINGS) ×1
ARMBAND PINK RESTRICT EXTREMIT (MISCELLANEOUS) ×6 IMPLANT
CANISTER SUCT 3000ML PPV (MISCELLANEOUS) ×3 IMPLANT
CLIP TI MEDIUM 6 (CLIP) ×2 IMPLANT
CLIP VESOCCLUDE MED 6/CT (CLIP) ×3 IMPLANT
CLIP VESOCCLUDE SM WIDE 6/CT (CLIP) ×3 IMPLANT
DERMABOND ADVANCED (GAUZE/BANDAGES/DRESSINGS) ×2
DERMABOND ADVANCED .7 DNX12 (GAUZE/BANDAGES/DRESSINGS) ×1 IMPLANT
ELECT REM PT RETURN 9FT ADLT (ELECTROSURGICAL) ×3
ELECTRODE REM PT RTRN 9FT ADLT (ELECTROSURGICAL) ×1 IMPLANT
GLOVE BIOGEL PI IND STRL 7.5 (GLOVE) ×1 IMPLANT
GLOVE BIOGEL PI INDICATOR 7.5 (GLOVE) ×2
GLOVE SURG SS PI 7.5 STRL IVOR (GLOVE) ×3 IMPLANT
GOWN STRL REUS W/ TWL LRG LVL3 (GOWN DISPOSABLE) ×2 IMPLANT
GOWN STRL REUS W/ TWL XL LVL3 (GOWN DISPOSABLE) ×1 IMPLANT
GOWN STRL REUS W/TWL LRG LVL3 (GOWN DISPOSABLE) ×6
GOWN STRL REUS W/TWL XL LVL3 (GOWN DISPOSABLE) ×3
GRAFT GORETEX STRT 4-7X45 (Vascular Products) ×2 IMPLANT
HEMOSTAT SNOW SURGICEL 2X4 (HEMOSTASIS) ×2 IMPLANT
KIT BASIN OR (CUSTOM PROCEDURE TRAY) ×3 IMPLANT
KIT ROOM TURNOVER OR (KITS) ×3 IMPLANT
NS IRRIG 1000ML POUR BTL (IV SOLUTION) ×3 IMPLANT
PACK CV ACCESS (CUSTOM PROCEDURE TRAY) ×3 IMPLANT
PAD ARMBOARD 7.5X6 YLW CONV (MISCELLANEOUS) ×6 IMPLANT
SUT PROLENE 6 0 BV (SUTURE) ×8 IMPLANT
SUT VIC AB 3-0 SH 27 (SUTURE) ×6
SUT VIC AB 3-0 SH 27X BRD (SUTURE) ×2 IMPLANT
SUT VICRYL 4-0 PS2 18IN ABS (SUTURE) IMPLANT
SYR TOOMEY 50ML (SYRINGE) IMPLANT
UNDERPAD 30X30 (UNDERPADS AND DIAPERS) ×3 IMPLANT
WATER STERILE IRR 1000ML POUR (IV SOLUTION) ×3 IMPLANT

## 2016-12-27 NOTE — Anesthesia Preprocedure Evaluation (Signed)
Anesthesia Evaluation  Patient identified by MRN, date of birth, ID band Patient awake    Reviewed: Allergy & Precautions, H&P , NPO status , Patient's Chart, lab work & pertinent test results  Airway Mallampati: II  TM Distance: >3 FB Neck ROM: Full    Dental  (+) Edentulous Upper, Edentulous Lower   Pulmonary neg pulmonary ROS,    breath sounds clear to auscultation       Cardiovascular hypertension, Pt. on medications  Rhythm:Regular Rate:Normal     Neuro/Psych PSYCHIATRIC DISORDERS Bipolar Disorder  Neuromuscular disease    GI/Hepatic negative GI ROS, Neg liver ROS,   Endo/Other  Hypothyroidism   Renal/GU ESRF and DialysisRenal diseaseDialysis yesterday     Musculoskeletal   Abdominal   Peds  Hematology  (+) anemia ,   Anesthesia Other Findings   Reproductive/Obstetrics                             Anesthesia Physical Anesthesia Plan  ASA: III  Anesthesia Plan: MAC   Post-op Pain Management:    Induction: Intravenous  PONV Risk Score and Plan: 1 and Ondansetron and Treatment may vary due to age or medical condition  Airway Management Planned: Nasal Cannula  Additional Equipment: None  Intra-op Plan:   Post-operative Plan:   Informed Consent: I have reviewed the patients History and Physical, chart, labs and discussed the procedure including the risks, benefits and alternatives for the proposed anesthesia with the patient or authorized representative who has indicated his/her understanding and acceptance.   Dental advisory given  Plan Discussed with: CRNA and Surgeon  Anesthesia Plan Comments:         Anesthesia Quick Evaluation

## 2016-12-27 NOTE — Telephone Encounter (Signed)
-----   Message from Mena Goes, RN sent at 12/27/2016  9:25 AM EST ----- Regarding: 4 weeks w/ duplex   ----- Message ----- From: Iline Oven Sent: 12/27/2016   9:12 AM To: Vvs Charge Pool  Can you schedule an appt for this pt with Dr. Trula Slade in about 4 weeks with AV graft duplex.  PO L arm brach-ax dialysis graft. Thanks, [facility]

## 2016-12-27 NOTE — H&P (Signed)
   Patient name: Darren Dunn MRN: [id number] DOB: Nov 17, 1945 Sex: male  REASON FOR VISIT:     ESRD  HISTORY OF PRESENT ILLNESS:   Darren Dunn is a 71 y.o. male who is here for HD access.  Venogram on left was normal  CURRENT MEDICATIONS:    Current Facility-Administered Medications  Medication Dose Route Frequency Provider Last Rate Last Dose  . dextrose 5 % with cefUROXime (ZINACEF) ADS Med           . 0.9 %  sodium chloride infusion   Intravenous Continuous Serafina Mitchell, MD      . cefUROXime (ZINACEF) 1.5 g in dextrose 5 % 50 mL IVPB  1.5 g Intravenous 30 min Pre-Op Serafina Mitchell, MD        REVIEW OF SYSTEMS:   [X]  denotes positive finding, [ ]  denotes negative finding Cardiac  Comments:  Chest pain or chest pressure:    Shortness of breath upon exertion:    Short of breath when lying flat:    Irregular heart rhythm:    Constitutional    Fever or chills:      PHYSICAL EXAM:   Vitals:   12/27/16 0603  BP: (!) 153/82  Pulse: 77  Resp: 18  Temp: 97.9 F (36.6 C)  TempSrc: Oral  SpO2: 100%  Weight: 134 lb (60.8 kg)  Height: 5' 8"  (1.727 m)    GENERAL: The patient is a well-nourished male, in no acute distress. The vital signs are documented above. CARDIOVASCULAR: There is a regular rate and rhythm. PULMONARY: Non-labored respirations Palp radial pulse  STUDIES:   none   MEDICAL ISSUES:   For LEft upper arm AVGG.   Risks and benefits discussed  [name], MD [facility] Specialists of [facility] [phone] Pager [phone]

## 2016-12-27 NOTE — Telephone Encounter (Signed)
Sched lab 01/18/17 at 4:00 and MD 01/23/17 at 10:30. Spoke to pt.

## 2016-12-27 NOTE — Brief Op Note (Signed)
12/27/2016  9:25 PM  PATIENT:  Darren Dunn  71 y.o. male  PRE-OPERATIVE DIAGNOSIS:  END STAGE RENAL DISEASE  POST-OPERATIVE DIAGNOSIS:  END STAGE RENAL DISEASE  PROCEDURE:  Procedure(s): INSERTION OF ARTERIOVENOUS (AV) GORE-TEX GRAFT LEFT UPPER ARM (Left)  SURGEON:  Surgeon(s) and Role:    * Serafina Mitchell, MD - Primary  PHYSICIAN ASSISTANT:   ASSISTANTS: Matt Evelans   ANESTHESIA:   MAC  EBL:  25 mL   BLOOD ADMINISTERED:none  DRAINS: none   LOCAL MEDICATIONS USED:  LIDOCAINE   SPECIMEN:  No Specimen  DISPOSITION OF SPECIMEN:  N/A  COUNTS:  YES  TOURNIQUET:  * No tourniquets in log *  DICTATION: .Dragon Dictation  PLAN OF CARE: Discharge to home after PACU  PATIENT DISPOSITION:  PACU - hemodynamically stable.   Delay start of Pharmacological VTE agent (>24hrs) due to surgical blood loss or risk of bleeding: not applicable

## 2016-12-27 NOTE — Transfer of Care (Signed)
Immediate Anesthesia Transfer of Care Note  Patient: Darren Dunn  Procedure(s) Performed: INSERTION OF ARTERIOVENOUS (AV) GORE-TEX GRAFT LEFT UPPER ARM (Left Arm Upper)  Patient Location: PACU  Anesthesia Type:MAC  Level of Consciousness: awake, alert , oriented and sedated  Airway & Oxygen Therapy: Patient Spontanous Breathing and Patient connected to nasal cannula oxygen  Post-op Assessment: Report given to RN, Post -op Vital signs reviewed and stable and Patient moving all extremities  Post vital signs: Reviewed and stable  Last Vitals:  Vitals:   12/27/16 0603  BP: (!) 153/82  Pulse: 77  Resp: 18  Temp: 36.6 C  SpO2: 100%    Last Pain:  Vitals:   12/27/16 0603  TempSrc: Oral      Patients Stated Pain Goal: 6 (61/22/44 9753)  Complications: No apparent anesthesia complications

## 2016-12-27 NOTE — Discharge Instructions (Signed)
° °  [facility] ° °Discharge Instructions ° °AV Fistula or Graft Surgery for Dialysis Access ° °Please refer to the following instructions for your post-procedure care. Your surgeon or physician assistant will discuss any changes with you. ° °Activity ° °You may drive the day following your surgery, if you are comfortable and no longer taking prescription pain medication. Resume full activity as the soreness in your incision resolves. ° °Bathing/Showering ° °You may shower after you go home. Keep your incision dry for 48 hours. Do not soak in a bathtub, hot tub, or swim until the incision heals completely. You may not shower if you have a hemodialysis catheter. ° °Incision Care ° °Clean your incision with mild soap and water after 48 hours. Pat the area dry with a clean towel. You do not need a bandage unless otherwise instructed. Do not apply any ointments or creams to your incision. You may have skin glue on your incision. Do not peel it off. It will come off on its own in about one week. Your arm may swell a bit after surgery. To reduce swelling use pillows to elevate your arm so it is above your heart. Your doctor will tell you if you need to lightly wrap your arm with an ACE bandage. ° °Diet ° °Resume your normal diet. There are not special food restrictions following this procedure. In order to heal from your surgery, it is CRITICAL to get adequate nutrition. Your body requires vitamins, minerals, and protein. Vegetables are the best source of vitamins and minerals. Vegetables also provide the perfect balance of protein. Processed food has little nutritional value, so try to avoid this. ° °Medications ° °Resume taking all of your medications. If your incision is causing pain, you may take over-the counter pain relievers such as acetaminophen (Tylenol). If you were prescribed a stronger pain medication, please be aware these medications can cause nausea and constipation. Prevent  nausea by taking the medication with a snack or meal. Avoid constipation by drinking plenty of fluids and eating foods with high amount of fiber, such as fruits, vegetables, and grains. Do not take Tylenol if you are taking prescription pain medications. ° ° ° ° °Follow up °Your surgeon may want to see you in the office following your access surgery. If so, this will be arranged at the time of your surgery. ° °Please call us immediately for any of the following conditions: ° °Increased pain, redness, drainage (pus) from your incision site °Fever of 101 degrees or higher °Severe or worsening pain at your incision site °Hand pain or numbness. ° °Reduce your risk of vascular disease: ° °Stop smoking. If you would like help, call [facility] at 1-800-QUIT-NOW (1-800-784-8669) or [place] at 336-586-4000 ° °Manage your cholesterol °Maintain a desired weight °Control your diabetes °Keep your blood pressure down ° °Dialysis ° °It will take several weeks to several months for your new dialysis access to be ready for use. Your surgeon will determine when it is OK to use it. Your nephrologist will continue to direct your dialysis. You can continue to use your Permcath until your new access is ready for use. ° °If you have any questions, please call the office at [phone]. ° °

## 2016-12-28 ENCOUNTER — Encounter (HOSPITAL_COMMUNITY): Payer: Self-pay | Admitting: Surgery

## 2016-12-28 DIAGNOSIS — D509 Iron deficiency anemia, unspecified: Secondary | ICD-10-CM | POA: Diagnosis not present

## 2016-12-28 DIAGNOSIS — N2581 Secondary hyperparathyroidism of renal origin: Secondary | ICD-10-CM | POA: Diagnosis not present

## 2016-12-28 DIAGNOSIS — D631 Anemia in chronic kidney disease: Secondary | ICD-10-CM | POA: Diagnosis not present

## 2016-12-28 DIAGNOSIS — I7789 Other specified disorders of arteries and arterioles: Secondary | ICD-10-CM | POA: Diagnosis not present

## 2016-12-28 DIAGNOSIS — Z992 Dependence on renal dialysis: Secondary | ICD-10-CM | POA: Diagnosis not present

## 2016-12-28 DIAGNOSIS — N186 End stage renal disease: Secondary | ICD-10-CM | POA: Diagnosis not present

## 2016-12-28 NOTE — Anesthesia Postprocedure Evaluation (Signed)
Anesthesia Post Note  Patient: Darren Dunn  Procedure(s) Performed: INSERTION OF ARTERIOVENOUS (AV) GORE-TEX GRAFT LEFT UPPER ARM (Left Arm Upper)     Patient location during evaluation: PACU Anesthesia Type: MAC Level of consciousness: awake and alert Pain management: pain level controlled Vital Signs Assessment: post-procedure vital signs reviewed and stable Respiratory status: spontaneous breathing, nonlabored ventilation, respiratory function stable and patient connected to nasal cannula oxygen Cardiovascular status: stable and blood pressure returned to baseline Postop Assessment: no apparent nausea or vomiting Anesthetic complications: no    Last Vitals:  Vitals:   12/27/16 0930 12/27/16 0937  BP: 117/60 (!) 129/46  Pulse: 83 82  Resp:  16  Temp:    SpO2: 97% 98%    Last Pain:  Vitals:   12/27/16 0603  TempSrc: Oral                 Carrell Rahmani

## 2016-12-28 NOTE — Op Note (Signed)
    Patient name: Darren Dunn MRN: [id number] DOB: [birth year] Sex: male  12/27/2016 Pre-operative Diagnosis: ESRD Post-operative diagnosis:  Same Surgeon:  [name] Assistants:  Arlee Muslim Procedure:   L UE AVGG Anesthesia:  MAC Blood Loss:  minimal Specimens:  none  Findings:  Normal artery, 69m axillary vein.  End to side venous anastamosis  Indications: The patient comes in today for left upper arm dialysis graft.  He has previously had a right HeRO graft which has failed.  This was exchanged out for a catheter.  I performed a left-sided central venogram which confirmed patency of the central venous system.  He is here today for upper arm graft.  Procedure:  The patient was [email or web address]  The patient was then placed supine on the table. MAC anesthesia was administered.  The patient was prepped and draped in the usual sterile fashion.  A time out was called and antibiotics were administered.  A longitudinal incision was made at the antecubital crease.  Through this incision I dissected out the brachial artery which was disease-free in approximately 3-4 mm.  It was exposed for a distance of approximately 2 cm.  I then made a longitudinal incision up near the axilla.  I dissected out the axillary vein.  I then used a curved tunneler to create a tunnel between the 2 incisions.  The patient was fully heparinized.  I selected a 4 x 7 stretch Gore-Tex graft.  The artery was occluded with vascular clamps.  A #11 blade was used to make an arteriotomy which was extended longitudinally with Potts scissors.  A running anastomosis was created with 6-0 Prolene.  Prior to completion the appropriate flushing maneuvers were performed and the anastomosis was completed.  There was excellent pulsatile flow through the graft.  The graft was then occluded with a vascular clamp and flushed with heparin saline.  I then occluded the axillary vein.  I made a  arteriotomy with a #11 blade which was extended longitudinally with Potts scissors.  The graft was then beveled to fit the size of the venotomy and a running anastomosis was created with 6-0 Prolene.  Prior to completion, the appropriate flushing maneuvers were performed and the anastomosis was completed.  The patient had a great thrill within the graft and Doppler signals at the wrist.  Protamine was given.  Once hemostasis was satisfactory, both incisions were closed with 2 layers of Vicryl followed by Dermabond.  There were no immediate complications.  The foramen stable condition.   Disposition: To PACU stable   V. WAnnamarie Major M.D. [facility]: [phone]:  [phone]

## 2016-12-31 DIAGNOSIS — D509 Iron deficiency anemia, unspecified: Secondary | ICD-10-CM | POA: Diagnosis not present

## 2016-12-31 DIAGNOSIS — N186 End stage renal disease: Secondary | ICD-10-CM | POA: Diagnosis not present

## 2016-12-31 DIAGNOSIS — N2581 Secondary hyperparathyroidism of renal origin: Secondary | ICD-10-CM | POA: Diagnosis not present

## 2016-12-31 DIAGNOSIS — D631 Anemia in chronic kidney disease: Secondary | ICD-10-CM | POA: Diagnosis not present

## 2017-01-02 DIAGNOSIS — N2581 Secondary hyperparathyroidism of renal origin: Secondary | ICD-10-CM | POA: Diagnosis not present

## 2017-01-02 DIAGNOSIS — D509 Iron deficiency anemia, unspecified: Secondary | ICD-10-CM | POA: Diagnosis not present

## 2017-01-02 DIAGNOSIS — D631 Anemia in chronic kidney disease: Secondary | ICD-10-CM | POA: Diagnosis not present

## 2017-01-02 DIAGNOSIS — N186 End stage renal disease: Secondary | ICD-10-CM | POA: Diagnosis not present

## 2017-01-04 DIAGNOSIS — N186 End stage renal disease: Secondary | ICD-10-CM | POA: Diagnosis not present

## 2017-01-04 DIAGNOSIS — D509 Iron deficiency anemia, unspecified: Secondary | ICD-10-CM | POA: Diagnosis not present

## 2017-01-04 DIAGNOSIS — D631 Anemia in chronic kidney disease: Secondary | ICD-10-CM | POA: Diagnosis not present

## 2017-01-04 DIAGNOSIS — N2581 Secondary hyperparathyroidism of renal origin: Secondary | ICD-10-CM | POA: Diagnosis not present

## 2017-01-07 ENCOUNTER — Ambulatory Visit: Payer: Medicare Other | Admitting: Surgery

## 2017-01-07 DIAGNOSIS — D631 Anemia in chronic kidney disease: Secondary | ICD-10-CM | POA: Diagnosis not present

## 2017-01-07 DIAGNOSIS — D509 Iron deficiency anemia, unspecified: Secondary | ICD-10-CM | POA: Diagnosis not present

## 2017-01-07 DIAGNOSIS — N186 End stage renal disease: Secondary | ICD-10-CM | POA: Diagnosis not present

## 2017-01-07 DIAGNOSIS — N2581 Secondary hyperparathyroidism of renal origin: Secondary | ICD-10-CM | POA: Diagnosis not present

## 2017-01-09 DIAGNOSIS — N186 End stage renal disease: Secondary | ICD-10-CM | POA: Diagnosis not present

## 2017-01-09 DIAGNOSIS — N2581 Secondary hyperparathyroidism of renal origin: Secondary | ICD-10-CM | POA: Diagnosis not present

## 2017-01-09 DIAGNOSIS — D631 Anemia in chronic kidney disease: Secondary | ICD-10-CM | POA: Diagnosis not present

## 2017-01-09 DIAGNOSIS — D509 Iron deficiency anemia, unspecified: Secondary | ICD-10-CM | POA: Diagnosis not present

## 2017-01-11 DIAGNOSIS — N2581 Secondary hyperparathyroidism of renal origin: Secondary | ICD-10-CM | POA: Diagnosis not present

## 2017-01-11 DIAGNOSIS — D509 Iron deficiency anemia, unspecified: Secondary | ICD-10-CM | POA: Diagnosis not present

## 2017-01-11 DIAGNOSIS — N186 End stage renal disease: Secondary | ICD-10-CM | POA: Diagnosis not present

## 2017-01-11 DIAGNOSIS — D631 Anemia in chronic kidney disease: Secondary | ICD-10-CM | POA: Diagnosis not present

## 2017-01-14 DIAGNOSIS — N186 End stage renal disease: Secondary | ICD-10-CM | POA: Diagnosis not present

## 2017-01-14 DIAGNOSIS — D509 Iron deficiency anemia, unspecified: Secondary | ICD-10-CM | POA: Diagnosis not present

## 2017-01-14 DIAGNOSIS — N2581 Secondary hyperparathyroidism of renal origin: Secondary | ICD-10-CM | POA: Diagnosis not present

## 2017-01-14 DIAGNOSIS — D631 Anemia in chronic kidney disease: Secondary | ICD-10-CM | POA: Diagnosis not present

## 2017-01-16 DIAGNOSIS — D509 Iron deficiency anemia, unspecified: Secondary | ICD-10-CM | POA: Diagnosis not present

## 2017-01-16 DIAGNOSIS — D631 Anemia in chronic kidney disease: Secondary | ICD-10-CM | POA: Diagnosis not present

## 2017-01-16 DIAGNOSIS — N2581 Secondary hyperparathyroidism of renal origin: Secondary | ICD-10-CM | POA: Diagnosis not present

## 2017-01-16 DIAGNOSIS — N186 End stage renal disease: Secondary | ICD-10-CM | POA: Diagnosis not present

## 2017-01-17 ENCOUNTER — Encounter (HOSPITAL_COMMUNITY): Payer: Medicare Other

## 2017-01-18 ENCOUNTER — Encounter (HOSPITAL_COMMUNITY): Payer: Medicare Other

## 2017-01-18 DIAGNOSIS — N2581 Secondary hyperparathyroidism of renal origin: Secondary | ICD-10-CM | POA: Diagnosis not present

## 2017-01-18 DIAGNOSIS — D631 Anemia in chronic kidney disease: Secondary | ICD-10-CM | POA: Diagnosis not present

## 2017-01-18 DIAGNOSIS — D509 Iron deficiency anemia, unspecified: Secondary | ICD-10-CM | POA: Diagnosis not present

## 2017-01-18 DIAGNOSIS — F319 Bipolar disorder, unspecified: Secondary | ICD-10-CM | POA: Diagnosis not present

## 2017-01-18 DIAGNOSIS — N186 End stage renal disease: Secondary | ICD-10-CM | POA: Diagnosis not present

## 2017-01-20 DIAGNOSIS — D631 Anemia in chronic kidney disease: Secondary | ICD-10-CM | POA: Diagnosis not present

## 2017-01-20 DIAGNOSIS — N186 End stage renal disease: Secondary | ICD-10-CM | POA: Diagnosis not present

## 2017-01-20 DIAGNOSIS — D509 Iron deficiency anemia, unspecified: Secondary | ICD-10-CM | POA: Diagnosis not present

## 2017-01-20 DIAGNOSIS — N2581 Secondary hyperparathyroidism of renal origin: Secondary | ICD-10-CM | POA: Diagnosis not present

## 2017-01-23 ENCOUNTER — Ambulatory Visit (HOSPITAL_COMMUNITY)
Admission: RE | Admit: 2017-01-23 | Discharge: 2017-01-23 | Disposition: A | Discharge status: Home / Self Care | Payer: Medicare Other | Source: Ambulatory Visit | Attending: Surgery | Admitting: Surgery

## 2017-01-23 ENCOUNTER — Other Ambulatory Visit: Payer: Self-pay

## 2017-01-23 ENCOUNTER — Encounter: Payer: Medicare Other | Admitting: Surgery

## 2017-01-23 DIAGNOSIS — N2581 Secondary hyperparathyroidism of renal origin: Secondary | ICD-10-CM | POA: Diagnosis not present

## 2017-01-23 DIAGNOSIS — Z992 Dependence on renal dialysis: Secondary | ICD-10-CM

## 2017-01-23 DIAGNOSIS — Z9889 Other specified postprocedural states: Secondary | ICD-10-CM | POA: Diagnosis not present

## 2017-01-23 DIAGNOSIS — N186 End stage renal disease: Secondary | ICD-10-CM

## 2017-01-23 DIAGNOSIS — D631 Anemia in chronic kidney disease: Secondary | ICD-10-CM | POA: Diagnosis not present

## 2017-01-23 DIAGNOSIS — D509 Iron deficiency anemia, unspecified: Secondary | ICD-10-CM | POA: Diagnosis not present

## 2017-01-25 DIAGNOSIS — D631 Anemia in chronic kidney disease: Secondary | ICD-10-CM | POA: Diagnosis not present

## 2017-01-25 DIAGNOSIS — N2581 Secondary hyperparathyroidism of renal origin: Secondary | ICD-10-CM | POA: Diagnosis not present

## 2017-01-25 DIAGNOSIS — N186 End stage renal disease: Secondary | ICD-10-CM | POA: Diagnosis not present

## 2017-01-25 DIAGNOSIS — D509 Iron deficiency anemia, unspecified: Secondary | ICD-10-CM | POA: Diagnosis not present

## 2017-01-27 DIAGNOSIS — N186 End stage renal disease: Secondary | ICD-10-CM | POA: Diagnosis not present

## 2017-01-27 DIAGNOSIS — D509 Iron deficiency anemia, unspecified: Secondary | ICD-10-CM | POA: Diagnosis not present

## 2017-01-27 DIAGNOSIS — D631 Anemia in chronic kidney disease: Secondary | ICD-10-CM | POA: Diagnosis not present

## 2017-01-27 DIAGNOSIS — N2581 Secondary hyperparathyroidism of renal origin: Secondary | ICD-10-CM | POA: Diagnosis not present

## 2017-01-28 DIAGNOSIS — N186 End stage renal disease: Secondary | ICD-10-CM | POA: Diagnosis not present

## 2017-01-28 DIAGNOSIS — I7789 Other specified disorders of arteries and arterioles: Secondary | ICD-10-CM | POA: Diagnosis not present

## 2017-01-28 DIAGNOSIS — Z992 Dependence on renal dialysis: Secondary | ICD-10-CM | POA: Diagnosis not present

## 2017-01-30 DIAGNOSIS — D509 Iron deficiency anemia, unspecified: Secondary | ICD-10-CM | POA: Diagnosis not present

## 2017-01-30 DIAGNOSIS — D631 Anemia in chronic kidney disease: Secondary | ICD-10-CM | POA: Diagnosis not present

## 2017-01-30 DIAGNOSIS — N2581 Secondary hyperparathyroidism of renal origin: Secondary | ICD-10-CM | POA: Diagnosis not present

## 2017-01-30 DIAGNOSIS — N186 End stage renal disease: Secondary | ICD-10-CM | POA: Diagnosis not present

## 2017-02-01 DIAGNOSIS — D631 Anemia in chronic kidney disease: Secondary | ICD-10-CM | POA: Diagnosis not present

## 2017-02-01 DIAGNOSIS — N2581 Secondary hyperparathyroidism of renal origin: Secondary | ICD-10-CM | POA: Diagnosis not present

## 2017-02-01 DIAGNOSIS — D509 Iron deficiency anemia, unspecified: Secondary | ICD-10-CM | POA: Diagnosis not present

## 2017-02-01 DIAGNOSIS — N186 End stage renal disease: Secondary | ICD-10-CM | POA: Diagnosis not present

## 2017-02-04 ENCOUNTER — Encounter: Payer: Medicare Other | Admitting: Surgery

## 2017-02-04 DIAGNOSIS — D631 Anemia in chronic kidney disease: Secondary | ICD-10-CM | POA: Diagnosis not present

## 2017-02-04 DIAGNOSIS — Z992 Dependence on renal dialysis: Secondary | ICD-10-CM | POA: Diagnosis not present

## 2017-02-04 DIAGNOSIS — D509 Iron deficiency anemia, unspecified: Secondary | ICD-10-CM | POA: Diagnosis not present

## 2017-02-04 DIAGNOSIS — N186 End stage renal disease: Secondary | ICD-10-CM | POA: Diagnosis not present

## 2017-02-04 DIAGNOSIS — N2581 Secondary hyperparathyroidism of renal origin: Secondary | ICD-10-CM | POA: Diagnosis not present

## 2017-02-06 DIAGNOSIS — D509 Iron deficiency anemia, unspecified: Secondary | ICD-10-CM | POA: Diagnosis not present

## 2017-02-06 DIAGNOSIS — D631 Anemia in chronic kidney disease: Secondary | ICD-10-CM | POA: Diagnosis not present

## 2017-02-06 DIAGNOSIS — N2581 Secondary hyperparathyroidism of renal origin: Secondary | ICD-10-CM | POA: Diagnosis not present

## 2017-02-06 DIAGNOSIS — N186 End stage renal disease: Secondary | ICD-10-CM | POA: Diagnosis not present

## 2017-02-08 DIAGNOSIS — N2581 Secondary hyperparathyroidism of renal origin: Secondary | ICD-10-CM | POA: Diagnosis not present

## 2017-02-08 DIAGNOSIS — D509 Iron deficiency anemia, unspecified: Secondary | ICD-10-CM | POA: Diagnosis not present

## 2017-02-08 DIAGNOSIS — N186 End stage renal disease: Secondary | ICD-10-CM | POA: Diagnosis not present

## 2017-02-08 DIAGNOSIS — D631 Anemia in chronic kidney disease: Secondary | ICD-10-CM | POA: Diagnosis not present

## 2017-02-12 DIAGNOSIS — Z539 Procedure and treatment not carried out, unspecified reason: Secondary | ICD-10-CM | POA: Diagnosis not present

## 2017-02-12 DIAGNOSIS — N186 End stage renal disease: Secondary | ICD-10-CM | POA: Diagnosis not present

## 2017-02-13 DIAGNOSIS — D509 Iron deficiency anemia, unspecified: Secondary | ICD-10-CM | POA: Diagnosis not present

## 2017-02-13 DIAGNOSIS — N186 End stage renal disease: Secondary | ICD-10-CM | POA: Diagnosis not present

## 2017-02-13 DIAGNOSIS — D631 Anemia in chronic kidney disease: Secondary | ICD-10-CM | POA: Diagnosis not present

## 2017-02-13 DIAGNOSIS — N2581 Secondary hyperparathyroidism of renal origin: Secondary | ICD-10-CM | POA: Diagnosis not present

## 2017-02-14 DIAGNOSIS — N19 Unspecified kidney failure: Secondary | ICD-10-CM | POA: Diagnosis not present

## 2017-02-14 DIAGNOSIS — H9190 Unspecified hearing loss, unspecified ear: Secondary | ICD-10-CM | POA: Diagnosis not present

## 2017-02-14 DIAGNOSIS — Z79899 Other long term (current) drug therapy: Secondary | ICD-10-CM | POA: Diagnosis not present

## 2017-02-14 DIAGNOSIS — N189 Chronic kidney disease, unspecified: Secondary | ICD-10-CM | POA: Diagnosis not present

## 2017-02-14 DIAGNOSIS — I120 Hypertensive chronic kidney disease with stage 5 chronic kidney disease or end stage renal disease: Secondary | ICD-10-CM | POA: Diagnosis not present

## 2017-02-14 DIAGNOSIS — Z992 Dependence on renal dialysis: Secondary | ICD-10-CM | POA: Diagnosis not present

## 2017-02-14 DIAGNOSIS — N185 Chronic kidney disease, stage 5: Secondary | ICD-10-CM | POA: Diagnosis not present

## 2017-02-14 DIAGNOSIS — F17220 Nicotine dependence, chewing tobacco, uncomplicated: Secondary | ICD-10-CM | POA: Diagnosis not present

## 2017-02-14 DIAGNOSIS — F319 Bipolar disorder, unspecified: Secondary | ICD-10-CM | POA: Diagnosis not present

## 2017-02-14 DIAGNOSIS — Z452 Encounter for adjustment and management of vascular access device: Secondary | ICD-10-CM | POA: Diagnosis not present

## 2017-02-14 DIAGNOSIS — E079 Disorder of thyroid, unspecified: Secondary | ICD-10-CM | POA: Diagnosis not present

## 2017-02-14 DIAGNOSIS — Z79890 Hormone replacement therapy (postmenopausal): Secondary | ICD-10-CM | POA: Diagnosis not present

## 2017-02-14 DIAGNOSIS — Z4902 Encounter for fitting and adjustment of peritoneal dialysis catheter: Secondary | ICD-10-CM | POA: Diagnosis not present

## 2017-02-14 DIAGNOSIS — E8770 Fluid overload, unspecified: Secondary | ICD-10-CM | POA: Diagnosis not present

## 2017-02-14 DIAGNOSIS — Z882 Allergy status to sulfonamides status: Secondary | ICD-10-CM | POA: Diagnosis not present

## 2017-02-14 DIAGNOSIS — N186 End stage renal disease: Secondary | ICD-10-CM | POA: Diagnosis not present

## 2017-02-15 DIAGNOSIS — N2581 Secondary hyperparathyroidism of renal origin: Secondary | ICD-10-CM | POA: Diagnosis not present

## 2017-02-15 DIAGNOSIS — D509 Iron deficiency anemia, unspecified: Secondary | ICD-10-CM | POA: Diagnosis not present

## 2017-02-15 DIAGNOSIS — N186 End stage renal disease: Secondary | ICD-10-CM | POA: Diagnosis not present

## 2017-02-15 DIAGNOSIS — D631 Anemia in chronic kidney disease: Secondary | ICD-10-CM | POA: Diagnosis not present

## 2017-02-18 DIAGNOSIS — D631 Anemia in chronic kidney disease: Secondary | ICD-10-CM | POA: Diagnosis not present

## 2017-02-18 DIAGNOSIS — D509 Iron deficiency anemia, unspecified: Secondary | ICD-10-CM | POA: Diagnosis not present

## 2017-02-18 DIAGNOSIS — N2581 Secondary hyperparathyroidism of renal origin: Secondary | ICD-10-CM | POA: Diagnosis not present

## 2017-02-18 DIAGNOSIS — N186 End stage renal disease: Secondary | ICD-10-CM | POA: Diagnosis not present

## 2017-02-20 DIAGNOSIS — N2581 Secondary hyperparathyroidism of renal origin: Secondary | ICD-10-CM | POA: Diagnosis not present

## 2017-02-20 DIAGNOSIS — D631 Anemia in chronic kidney disease: Secondary | ICD-10-CM | POA: Diagnosis not present

## 2017-02-20 DIAGNOSIS — D509 Iron deficiency anemia, unspecified: Secondary | ICD-10-CM | POA: Diagnosis not present

## 2017-02-20 DIAGNOSIS — N186 End stage renal disease: Secondary | ICD-10-CM | POA: Diagnosis not present

## 2017-02-22 DIAGNOSIS — D631 Anemia in chronic kidney disease: Secondary | ICD-10-CM | POA: Diagnosis not present

## 2017-02-22 DIAGNOSIS — D509 Iron deficiency anemia, unspecified: Secondary | ICD-10-CM | POA: Diagnosis not present

## 2017-02-22 DIAGNOSIS — N2581 Secondary hyperparathyroidism of renal origin: Secondary | ICD-10-CM | POA: Diagnosis not present

## 2017-02-22 DIAGNOSIS — N186 End stage renal disease: Secondary | ICD-10-CM | POA: Diagnosis not present

## 2017-02-25 DIAGNOSIS — D509 Iron deficiency anemia, unspecified: Secondary | ICD-10-CM | POA: Diagnosis not present

## 2017-02-25 DIAGNOSIS — N186 End stage renal disease: Secondary | ICD-10-CM | POA: Diagnosis not present

## 2017-02-25 DIAGNOSIS — D631 Anemia in chronic kidney disease: Secondary | ICD-10-CM | POA: Diagnosis not present

## 2017-02-25 DIAGNOSIS — N2581 Secondary hyperparathyroidism of renal origin: Secondary | ICD-10-CM | POA: Diagnosis not present

## 2017-02-27 DIAGNOSIS — N186 End stage renal disease: Secondary | ICD-10-CM | POA: Diagnosis not present

## 2017-02-27 DIAGNOSIS — D631 Anemia in chronic kidney disease: Secondary | ICD-10-CM | POA: Diagnosis not present

## 2017-02-27 DIAGNOSIS — N2581 Secondary hyperparathyroidism of renal origin: Secondary | ICD-10-CM | POA: Diagnosis not present

## 2017-02-27 DIAGNOSIS — D509 Iron deficiency anemia, unspecified: Secondary | ICD-10-CM | POA: Diagnosis not present

## 2017-02-28 DIAGNOSIS — Z992 Dependence on renal dialysis: Secondary | ICD-10-CM | POA: Diagnosis not present

## 2017-02-28 DIAGNOSIS — N186 End stage renal disease: Secondary | ICD-10-CM | POA: Diagnosis not present

## 2017-02-28 DIAGNOSIS — I7789 Other specified disorders of arteries and arterioles: Secondary | ICD-10-CM | POA: Diagnosis not present

## 2017-03-01 DIAGNOSIS — N186 End stage renal disease: Secondary | ICD-10-CM | POA: Diagnosis not present

## 2017-03-01 DIAGNOSIS — Z992 Dependence on renal dialysis: Secondary | ICD-10-CM | POA: Diagnosis not present

## 2017-03-01 DIAGNOSIS — I7789 Other specified disorders of arteries and arterioles: Secondary | ICD-10-CM | POA: Diagnosis not present

## 2017-03-01 DIAGNOSIS — N2581 Secondary hyperparathyroidism of renal origin: Secondary | ICD-10-CM | POA: Diagnosis not present

## 2017-03-04 DIAGNOSIS — N2581 Secondary hyperparathyroidism of renal origin: Secondary | ICD-10-CM | POA: Diagnosis not present

## 2017-03-04 DIAGNOSIS — N186 End stage renal disease: Secondary | ICD-10-CM | POA: Diagnosis not present

## 2017-03-06 DIAGNOSIS — N186 End stage renal disease: Secondary | ICD-10-CM | POA: Diagnosis not present

## 2017-03-06 DIAGNOSIS — N2581 Secondary hyperparathyroidism of renal origin: Secondary | ICD-10-CM | POA: Diagnosis not present

## 2017-03-08 DIAGNOSIS — N2581 Secondary hyperparathyroidism of renal origin: Secondary | ICD-10-CM | POA: Diagnosis not present

## 2017-03-08 DIAGNOSIS — N186 End stage renal disease: Secondary | ICD-10-CM | POA: Diagnosis not present

## 2017-03-11 DIAGNOSIS — N2581 Secondary hyperparathyroidism of renal origin: Secondary | ICD-10-CM | POA: Diagnosis not present

## 2017-03-11 DIAGNOSIS — N2589 Other disorders resulting from impaired renal tubular function: Secondary | ICD-10-CM | POA: Diagnosis not present

## 2017-03-11 DIAGNOSIS — K769 Liver disease, unspecified: Secondary | ICD-10-CM | POA: Diagnosis not present

## 2017-03-11 DIAGNOSIS — E790 Hyperuricemia without signs of inflammatory arthritis and tophaceous disease: Secondary | ICD-10-CM | POA: Diagnosis not present

## 2017-03-11 DIAGNOSIS — D513 Other dietary vitamin B12 deficiency anemia: Secondary | ICD-10-CM | POA: Diagnosis not present

## 2017-03-11 DIAGNOSIS — D631 Anemia in chronic kidney disease: Secondary | ICD-10-CM | POA: Diagnosis not present

## 2017-03-11 DIAGNOSIS — D509 Iron deficiency anemia, unspecified: Secondary | ICD-10-CM | POA: Diagnosis not present

## 2017-03-11 DIAGNOSIS — N186 End stage renal disease: Secondary | ICD-10-CM | POA: Diagnosis not present

## 2017-03-11 DIAGNOSIS — Z4932 Encounter for adequacy testing for peritoneal dialysis: Secondary | ICD-10-CM | POA: Diagnosis not present

## 2017-03-11 DIAGNOSIS — E785 Hyperlipidemia, unspecified: Secondary | ICD-10-CM | POA: Diagnosis not present

## 2017-03-12 DIAGNOSIS — K769 Liver disease, unspecified: Secondary | ICD-10-CM | POA: Diagnosis not present

## 2017-03-12 DIAGNOSIS — N2589 Other disorders resulting from impaired renal tubular function: Secondary | ICD-10-CM | POA: Diagnosis not present

## 2017-03-12 DIAGNOSIS — D513 Other dietary vitamin B12 deficiency anemia: Secondary | ICD-10-CM | POA: Diagnosis not present

## 2017-03-12 DIAGNOSIS — N2581 Secondary hyperparathyroidism of renal origin: Secondary | ICD-10-CM | POA: Diagnosis not present

## 2017-03-12 DIAGNOSIS — N186 End stage renal disease: Secondary | ICD-10-CM | POA: Diagnosis not present

## 2017-03-12 DIAGNOSIS — D509 Iron deficiency anemia, unspecified: Secondary | ICD-10-CM | POA: Diagnosis not present

## 2017-03-13 DIAGNOSIS — N2581 Secondary hyperparathyroidism of renal origin: Secondary | ICD-10-CM | POA: Diagnosis not present

## 2017-03-13 DIAGNOSIS — N186 End stage renal disease: Secondary | ICD-10-CM | POA: Diagnosis not present

## 2017-03-14 DIAGNOSIS — N186 End stage renal disease: Secondary | ICD-10-CM | POA: Diagnosis not present

## 2017-03-14 DIAGNOSIS — N2581 Secondary hyperparathyroidism of renal origin: Secondary | ICD-10-CM | POA: Diagnosis not present

## 2017-03-15 DIAGNOSIS — N2581 Secondary hyperparathyroidism of renal origin: Secondary | ICD-10-CM | POA: Diagnosis not present

## 2017-03-15 DIAGNOSIS — N186 End stage renal disease: Secondary | ICD-10-CM | POA: Diagnosis not present

## 2017-03-16 DIAGNOSIS — N2581 Secondary hyperparathyroidism of renal origin: Secondary | ICD-10-CM | POA: Diagnosis not present

## 2017-03-16 DIAGNOSIS — Z4932 Encounter for adequacy testing for peritoneal dialysis: Secondary | ICD-10-CM | POA: Diagnosis not present

## 2017-03-16 DIAGNOSIS — N186 End stage renal disease: Secondary | ICD-10-CM | POA: Diagnosis not present

## 2017-03-16 DIAGNOSIS — D631 Anemia in chronic kidney disease: Secondary | ICD-10-CM | POA: Diagnosis not present

## 2017-03-17 DIAGNOSIS — Z4932 Encounter for adequacy testing for peritoneal dialysis: Secondary | ICD-10-CM | POA: Diagnosis not present

## 2017-03-17 DIAGNOSIS — N2581 Secondary hyperparathyroidism of renal origin: Secondary | ICD-10-CM | POA: Diagnosis not present

## 2017-03-17 DIAGNOSIS — D631 Anemia in chronic kidney disease: Secondary | ICD-10-CM | POA: Diagnosis not present

## 2017-03-17 DIAGNOSIS — N186 End stage renal disease: Secondary | ICD-10-CM | POA: Diagnosis not present

## 2017-03-18 DIAGNOSIS — N186 End stage renal disease: Secondary | ICD-10-CM | POA: Diagnosis not present

## 2017-03-18 DIAGNOSIS — Z4932 Encounter for adequacy testing for peritoneal dialysis: Secondary | ICD-10-CM | POA: Diagnosis not present

## 2017-03-18 DIAGNOSIS — N2581 Secondary hyperparathyroidism of renal origin: Secondary | ICD-10-CM | POA: Diagnosis not present

## 2017-03-18 DIAGNOSIS — D631 Anemia in chronic kidney disease: Secondary | ICD-10-CM | POA: Diagnosis not present

## 2017-03-19 DIAGNOSIS — N186 End stage renal disease: Secondary | ICD-10-CM | POA: Diagnosis not present

## 2017-03-19 DIAGNOSIS — N2581 Secondary hyperparathyroidism of renal origin: Secondary | ICD-10-CM | POA: Diagnosis not present

## 2017-03-19 DIAGNOSIS — D631 Anemia in chronic kidney disease: Secondary | ICD-10-CM | POA: Diagnosis not present

## 2017-03-19 DIAGNOSIS — Z4932 Encounter for adequacy testing for peritoneal dialysis: Secondary | ICD-10-CM | POA: Diagnosis not present

## 2017-03-20 DIAGNOSIS — D631 Anemia in chronic kidney disease: Secondary | ICD-10-CM | POA: Diagnosis not present

## 2017-03-20 DIAGNOSIS — N2581 Secondary hyperparathyroidism of renal origin: Secondary | ICD-10-CM | POA: Diagnosis not present

## 2017-03-20 DIAGNOSIS — Z4932 Encounter for adequacy testing for peritoneal dialysis: Secondary | ICD-10-CM | POA: Diagnosis not present

## 2017-03-20 DIAGNOSIS — N186 End stage renal disease: Secondary | ICD-10-CM | POA: Diagnosis not present

## 2017-03-21 DIAGNOSIS — N2581 Secondary hyperparathyroidism of renal origin: Secondary | ICD-10-CM | POA: Diagnosis not present

## 2017-03-21 DIAGNOSIS — D631 Anemia in chronic kidney disease: Secondary | ICD-10-CM | POA: Diagnosis not present

## 2017-03-21 DIAGNOSIS — Z4932 Encounter for adequacy testing for peritoneal dialysis: Secondary | ICD-10-CM | POA: Diagnosis not present

## 2017-03-21 DIAGNOSIS — N186 End stage renal disease: Secondary | ICD-10-CM | POA: Diagnosis not present

## 2017-03-22 DIAGNOSIS — N2581 Secondary hyperparathyroidism of renal origin: Secondary | ICD-10-CM | POA: Diagnosis not present

## 2017-03-22 DIAGNOSIS — Z4932 Encounter for adequacy testing for peritoneal dialysis: Secondary | ICD-10-CM | POA: Diagnosis not present

## 2017-03-22 DIAGNOSIS — D631 Anemia in chronic kidney disease: Secondary | ICD-10-CM | POA: Diagnosis not present

## 2017-03-22 DIAGNOSIS — N186 End stage renal disease: Secondary | ICD-10-CM | POA: Diagnosis not present

## 2017-03-23 DIAGNOSIS — Z4932 Encounter for adequacy testing for peritoneal dialysis: Secondary | ICD-10-CM | POA: Diagnosis not present

## 2017-03-23 DIAGNOSIS — N2581 Secondary hyperparathyroidism of renal origin: Secondary | ICD-10-CM | POA: Diagnosis not present

## 2017-03-23 DIAGNOSIS — N186 End stage renal disease: Secondary | ICD-10-CM | POA: Diagnosis not present

## 2017-03-23 DIAGNOSIS — D631 Anemia in chronic kidney disease: Secondary | ICD-10-CM | POA: Diagnosis not present

## 2017-03-24 DIAGNOSIS — Z4932 Encounter for adequacy testing for peritoneal dialysis: Secondary | ICD-10-CM | POA: Diagnosis not present

## 2017-03-24 DIAGNOSIS — D631 Anemia in chronic kidney disease: Secondary | ICD-10-CM | POA: Diagnosis not present

## 2017-03-24 DIAGNOSIS — N186 End stage renal disease: Secondary | ICD-10-CM | POA: Diagnosis not present

## 2017-03-24 DIAGNOSIS — N2581 Secondary hyperparathyroidism of renal origin: Secondary | ICD-10-CM | POA: Diagnosis not present

## 2017-03-25 DIAGNOSIS — D631 Anemia in chronic kidney disease: Secondary | ICD-10-CM | POA: Diagnosis not present

## 2017-03-25 DIAGNOSIS — N186 End stage renal disease: Secondary | ICD-10-CM | POA: Diagnosis not present

## 2017-03-25 DIAGNOSIS — Z4932 Encounter for adequacy testing for peritoneal dialysis: Secondary | ICD-10-CM | POA: Diagnosis not present

## 2017-03-25 DIAGNOSIS — N2581 Secondary hyperparathyroidism of renal origin: Secondary | ICD-10-CM | POA: Diagnosis not present

## 2017-03-26 DIAGNOSIS — Z4932 Encounter for adequacy testing for peritoneal dialysis: Secondary | ICD-10-CM | POA: Diagnosis not present

## 2017-03-26 DIAGNOSIS — N2581 Secondary hyperparathyroidism of renal origin: Secondary | ICD-10-CM | POA: Diagnosis not present

## 2017-03-26 DIAGNOSIS — N186 End stage renal disease: Secondary | ICD-10-CM | POA: Diagnosis not present

## 2017-03-26 DIAGNOSIS — D631 Anemia in chronic kidney disease: Secondary | ICD-10-CM | POA: Diagnosis not present

## 2017-03-27 DIAGNOSIS — Z4932 Encounter for adequacy testing for peritoneal dialysis: Secondary | ICD-10-CM | POA: Diagnosis not present

## 2017-03-27 DIAGNOSIS — N2581 Secondary hyperparathyroidism of renal origin: Secondary | ICD-10-CM | POA: Diagnosis not present

## 2017-03-27 DIAGNOSIS — N186 End stage renal disease: Secondary | ICD-10-CM | POA: Diagnosis not present

## 2017-03-27 DIAGNOSIS — D631 Anemia in chronic kidney disease: Secondary | ICD-10-CM | POA: Diagnosis not present

## 2017-03-28 DIAGNOSIS — Z4932 Encounter for adequacy testing for peritoneal dialysis: Secondary | ICD-10-CM | POA: Diagnosis not present

## 2017-03-28 DIAGNOSIS — N186 End stage renal disease: Secondary | ICD-10-CM | POA: Diagnosis not present

## 2017-03-28 DIAGNOSIS — D631 Anemia in chronic kidney disease: Secondary | ICD-10-CM | POA: Diagnosis not present

## 2017-03-28 DIAGNOSIS — N2581 Secondary hyperparathyroidism of renal origin: Secondary | ICD-10-CM | POA: Diagnosis not present

## 2017-03-29 DIAGNOSIS — D630 Anemia in neoplastic disease: Secondary | ICD-10-CM | POA: Diagnosis not present

## 2017-03-29 DIAGNOSIS — Z23 Encounter for immunization: Secondary | ICD-10-CM | POA: Diagnosis not present

## 2017-03-29 DIAGNOSIS — I7789 Other specified disorders of arteries and arterioles: Secondary | ICD-10-CM | POA: Diagnosis not present

## 2017-03-29 DIAGNOSIS — Z992 Dependence on renal dialysis: Secondary | ICD-10-CM | POA: Diagnosis not present

## 2017-03-29 DIAGNOSIS — N186 End stage renal disease: Secondary | ICD-10-CM | POA: Diagnosis not present

## 2017-03-29 DIAGNOSIS — N2581 Secondary hyperparathyroidism of renal origin: Secondary | ICD-10-CM | POA: Diagnosis not present

## 2017-03-30 DIAGNOSIS — Z23 Encounter for immunization: Secondary | ICD-10-CM | POA: Diagnosis not present

## 2017-03-30 DIAGNOSIS — N2581 Secondary hyperparathyroidism of renal origin: Secondary | ICD-10-CM | POA: Diagnosis not present

## 2017-03-30 DIAGNOSIS — N186 End stage renal disease: Secondary | ICD-10-CM | POA: Diagnosis not present

## 2017-03-30 DIAGNOSIS — D630 Anemia in neoplastic disease: Secondary | ICD-10-CM | POA: Diagnosis not present

## 2017-03-31 DIAGNOSIS — D630 Anemia in neoplastic disease: Secondary | ICD-10-CM | POA: Diagnosis not present

## 2017-03-31 DIAGNOSIS — N186 End stage renal disease: Secondary | ICD-10-CM | POA: Diagnosis not present

## 2017-03-31 DIAGNOSIS — Z23 Encounter for immunization: Secondary | ICD-10-CM | POA: Diagnosis not present

## 2017-03-31 DIAGNOSIS — N2581 Secondary hyperparathyroidism of renal origin: Secondary | ICD-10-CM | POA: Diagnosis not present

## 2017-04-01 DIAGNOSIS — D630 Anemia in neoplastic disease: Secondary | ICD-10-CM | POA: Diagnosis not present

## 2017-04-01 DIAGNOSIS — Z23 Encounter for immunization: Secondary | ICD-10-CM | POA: Diagnosis not present

## 2017-04-01 DIAGNOSIS — N186 End stage renal disease: Secondary | ICD-10-CM | POA: Diagnosis not present

## 2017-04-01 DIAGNOSIS — N2581 Secondary hyperparathyroidism of renal origin: Secondary | ICD-10-CM | POA: Diagnosis not present

## 2017-04-02 DIAGNOSIS — D630 Anemia in neoplastic disease: Secondary | ICD-10-CM | POA: Diagnosis not present

## 2017-04-02 DIAGNOSIS — N2581 Secondary hyperparathyroidism of renal origin: Secondary | ICD-10-CM | POA: Diagnosis not present

## 2017-04-02 DIAGNOSIS — Z23 Encounter for immunization: Secondary | ICD-10-CM | POA: Diagnosis not present

## 2017-04-02 DIAGNOSIS — N186 End stage renal disease: Secondary | ICD-10-CM | POA: Diagnosis not present

## 2017-04-03 DIAGNOSIS — N186 End stage renal disease: Secondary | ICD-10-CM | POA: Diagnosis not present

## 2017-04-03 DIAGNOSIS — N2581 Secondary hyperparathyroidism of renal origin: Secondary | ICD-10-CM | POA: Diagnosis not present

## 2017-04-03 DIAGNOSIS — Z23 Encounter for immunization: Secondary | ICD-10-CM | POA: Diagnosis not present

## 2017-04-03 DIAGNOSIS — D630 Anemia in neoplastic disease: Secondary | ICD-10-CM | POA: Diagnosis not present

## 2017-04-04 DIAGNOSIS — Z23 Encounter for immunization: Secondary | ICD-10-CM | POA: Diagnosis not present

## 2017-04-04 DIAGNOSIS — N2581 Secondary hyperparathyroidism of renal origin: Secondary | ICD-10-CM | POA: Diagnosis not present

## 2017-04-04 DIAGNOSIS — D630 Anemia in neoplastic disease: Secondary | ICD-10-CM | POA: Diagnosis not present

## 2017-04-04 DIAGNOSIS — N186 End stage renal disease: Secondary | ICD-10-CM | POA: Diagnosis not present

## 2017-04-05 DIAGNOSIS — Z23 Encounter for immunization: Secondary | ICD-10-CM | POA: Diagnosis not present

## 2017-04-05 DIAGNOSIS — N186 End stage renal disease: Secondary | ICD-10-CM | POA: Diagnosis not present

## 2017-04-05 DIAGNOSIS — D630 Anemia in neoplastic disease: Secondary | ICD-10-CM | POA: Diagnosis not present

## 2017-04-05 DIAGNOSIS — N2581 Secondary hyperparathyroidism of renal origin: Secondary | ICD-10-CM | POA: Diagnosis not present

## 2017-04-06 DIAGNOSIS — N2581 Secondary hyperparathyroidism of renal origin: Secondary | ICD-10-CM | POA: Diagnosis not present

## 2017-04-06 DIAGNOSIS — Z23 Encounter for immunization: Secondary | ICD-10-CM | POA: Diagnosis not present

## 2017-04-06 DIAGNOSIS — N186 End stage renal disease: Secondary | ICD-10-CM | POA: Diagnosis not present

## 2017-04-06 DIAGNOSIS — D630 Anemia in neoplastic disease: Secondary | ICD-10-CM | POA: Diagnosis not present

## 2017-04-07 DIAGNOSIS — Z23 Encounter for immunization: Secondary | ICD-10-CM | POA: Diagnosis not present

## 2017-04-07 DIAGNOSIS — D630 Anemia in neoplastic disease: Secondary | ICD-10-CM | POA: Diagnosis not present

## 2017-04-07 DIAGNOSIS — N186 End stage renal disease: Secondary | ICD-10-CM | POA: Diagnosis not present

## 2017-04-07 DIAGNOSIS — N2581 Secondary hyperparathyroidism of renal origin: Secondary | ICD-10-CM | POA: Diagnosis not present

## 2017-04-08 DIAGNOSIS — N2581 Secondary hyperparathyroidism of renal origin: Secondary | ICD-10-CM | POA: Diagnosis not present

## 2017-04-08 DIAGNOSIS — Z23 Encounter for immunization: Secondary | ICD-10-CM | POA: Diagnosis not present

## 2017-04-08 DIAGNOSIS — D630 Anemia in neoplastic disease: Secondary | ICD-10-CM | POA: Diagnosis not present

## 2017-04-08 DIAGNOSIS — N186 End stage renal disease: Secondary | ICD-10-CM | POA: Diagnosis not present

## 2017-04-09 DIAGNOSIS — D630 Anemia in neoplastic disease: Secondary | ICD-10-CM | POA: Diagnosis not present

## 2017-04-09 DIAGNOSIS — Z23 Encounter for immunization: Secondary | ICD-10-CM | POA: Diagnosis not present

## 2017-04-09 DIAGNOSIS — N186 End stage renal disease: Secondary | ICD-10-CM | POA: Diagnosis not present

## 2017-04-09 DIAGNOSIS — N2581 Secondary hyperparathyroidism of renal origin: Secondary | ICD-10-CM | POA: Diagnosis not present

## 2017-04-10 DIAGNOSIS — D630 Anemia in neoplastic disease: Secondary | ICD-10-CM | POA: Diagnosis not present

## 2017-04-10 DIAGNOSIS — Z23 Encounter for immunization: Secondary | ICD-10-CM | POA: Diagnosis not present

## 2017-04-10 DIAGNOSIS — N186 End stage renal disease: Secondary | ICD-10-CM | POA: Diagnosis not present

## 2017-04-10 DIAGNOSIS — N2581 Secondary hyperparathyroidism of renal origin: Secondary | ICD-10-CM | POA: Diagnosis not present

## 2017-04-11 DIAGNOSIS — N186 End stage renal disease: Secondary | ICD-10-CM | POA: Diagnosis not present

## 2017-04-11 DIAGNOSIS — N2581 Secondary hyperparathyroidism of renal origin: Secondary | ICD-10-CM | POA: Diagnosis not present

## 2017-04-11 DIAGNOSIS — Z23 Encounter for immunization: Secondary | ICD-10-CM | POA: Diagnosis not present

## 2017-04-11 DIAGNOSIS — D630 Anemia in neoplastic disease: Secondary | ICD-10-CM | POA: Diagnosis not present

## 2017-04-12 DIAGNOSIS — N186 End stage renal disease: Secondary | ICD-10-CM | POA: Diagnosis not present

## 2017-04-12 DIAGNOSIS — N2581 Secondary hyperparathyroidism of renal origin: Secondary | ICD-10-CM | POA: Diagnosis not present

## 2017-04-12 DIAGNOSIS — D630 Anemia in neoplastic disease: Secondary | ICD-10-CM | POA: Diagnosis not present

## 2017-04-12 DIAGNOSIS — Z23 Encounter for immunization: Secondary | ICD-10-CM | POA: Diagnosis not present

## 2017-04-13 DIAGNOSIS — Z23 Encounter for immunization: Secondary | ICD-10-CM | POA: Diagnosis not present

## 2017-04-13 DIAGNOSIS — D630 Anemia in neoplastic disease: Secondary | ICD-10-CM | POA: Diagnosis not present

## 2017-04-13 DIAGNOSIS — N186 End stage renal disease: Secondary | ICD-10-CM | POA: Diagnosis not present

## 2017-04-13 DIAGNOSIS — N2581 Secondary hyperparathyroidism of renal origin: Secondary | ICD-10-CM | POA: Diagnosis not present

## 2017-04-14 DIAGNOSIS — N186 End stage renal disease: Secondary | ICD-10-CM | POA: Diagnosis not present

## 2017-04-14 DIAGNOSIS — N2581 Secondary hyperparathyroidism of renal origin: Secondary | ICD-10-CM | POA: Diagnosis not present

## 2017-04-14 DIAGNOSIS — Z23 Encounter for immunization: Secondary | ICD-10-CM | POA: Diagnosis not present

## 2017-04-14 DIAGNOSIS — D630 Anemia in neoplastic disease: Secondary | ICD-10-CM | POA: Diagnosis not present

## 2017-04-15 DIAGNOSIS — Z23 Encounter for immunization: Secondary | ICD-10-CM | POA: Diagnosis not present

## 2017-04-15 DIAGNOSIS — N186 End stage renal disease: Secondary | ICD-10-CM | POA: Diagnosis not present

## 2017-04-15 DIAGNOSIS — N2581 Secondary hyperparathyroidism of renal origin: Secondary | ICD-10-CM | POA: Diagnosis not present

## 2017-04-15 DIAGNOSIS — D630 Anemia in neoplastic disease: Secondary | ICD-10-CM | POA: Diagnosis not present

## 2017-04-16 DIAGNOSIS — D630 Anemia in neoplastic disease: Secondary | ICD-10-CM | POA: Diagnosis not present

## 2017-04-16 DIAGNOSIS — N186 End stage renal disease: Secondary | ICD-10-CM | POA: Diagnosis not present

## 2017-04-16 DIAGNOSIS — N2581 Secondary hyperparathyroidism of renal origin: Secondary | ICD-10-CM | POA: Diagnosis not present

## 2017-04-16 DIAGNOSIS — Z23 Encounter for immunization: Secondary | ICD-10-CM | POA: Diagnosis not present

## 2017-04-17 DIAGNOSIS — D630 Anemia in neoplastic disease: Secondary | ICD-10-CM | POA: Diagnosis not present

## 2017-04-17 DIAGNOSIS — Z23 Encounter for immunization: Secondary | ICD-10-CM | POA: Diagnosis not present

## 2017-04-17 DIAGNOSIS — N2581 Secondary hyperparathyroidism of renal origin: Secondary | ICD-10-CM | POA: Diagnosis not present

## 2017-04-17 DIAGNOSIS — N186 End stage renal disease: Secondary | ICD-10-CM | POA: Diagnosis not present

## 2017-04-18 DIAGNOSIS — N2581 Secondary hyperparathyroidism of renal origin: Secondary | ICD-10-CM | POA: Diagnosis not present

## 2017-04-18 DIAGNOSIS — N186 End stage renal disease: Secondary | ICD-10-CM | POA: Diagnosis not present

## 2017-04-18 DIAGNOSIS — Z23 Encounter for immunization: Secondary | ICD-10-CM | POA: Diagnosis not present

## 2017-04-18 DIAGNOSIS — D630 Anemia in neoplastic disease: Secondary | ICD-10-CM | POA: Diagnosis not present

## 2017-04-19 ENCOUNTER — Encounter (INDEPENDENT_AMBULATORY_CARE_PROVIDER_SITE_OTHER): Payer: Self-pay | Admitting: Orthopaedic Surgery

## 2017-04-19 ENCOUNTER — Ambulatory Visit (INDEPENDENT_AMBULATORY_CARE_PROVIDER_SITE_OTHER): Payer: Medicare Other

## 2017-04-19 ENCOUNTER — Ambulatory Visit (INDEPENDENT_AMBULATORY_CARE_PROVIDER_SITE_OTHER): Payer: Medicare Other | Admitting: Orthopaedic Surgery

## 2017-04-19 VITALS — BP 150/71 | HR 88 | Ht 63.0 in | Wt 134.0 lb

## 2017-04-19 DIAGNOSIS — M79672 Pain in left foot: Secondary | ICD-10-CM | POA: Diagnosis not present

## 2017-04-19 DIAGNOSIS — N2581 Secondary hyperparathyroidism of renal origin: Secondary | ICD-10-CM | POA: Diagnosis not present

## 2017-04-19 DIAGNOSIS — Z23 Encounter for immunization: Secondary | ICD-10-CM | POA: Diagnosis not present

## 2017-04-19 DIAGNOSIS — D630 Anemia in neoplastic disease: Secondary | ICD-10-CM | POA: Diagnosis not present

## 2017-04-19 DIAGNOSIS — N186 End stage renal disease: Secondary | ICD-10-CM | POA: Diagnosis not present

## 2017-04-20 DIAGNOSIS — D630 Anemia in neoplastic disease: Secondary | ICD-10-CM | POA: Diagnosis not present

## 2017-04-20 DIAGNOSIS — N2581 Secondary hyperparathyroidism of renal origin: Secondary | ICD-10-CM | POA: Diagnosis not present

## 2017-04-20 DIAGNOSIS — N186 End stage renal disease: Secondary | ICD-10-CM | POA: Diagnosis not present

## 2017-04-20 DIAGNOSIS — Z23 Encounter for immunization: Secondary | ICD-10-CM | POA: Diagnosis not present

## 2017-04-21 DIAGNOSIS — Z23 Encounter for immunization: Secondary | ICD-10-CM | POA: Diagnosis not present

## 2017-04-21 DIAGNOSIS — N186 End stage renal disease: Secondary | ICD-10-CM | POA: Diagnosis not present

## 2017-04-21 DIAGNOSIS — D630 Anemia in neoplastic disease: Secondary | ICD-10-CM | POA: Diagnosis not present

## 2017-04-21 DIAGNOSIS — N2581 Secondary hyperparathyroidism of renal origin: Secondary | ICD-10-CM | POA: Diagnosis not present

## 2017-04-22 ENCOUNTER — Encounter (INDEPENDENT_AMBULATORY_CARE_PROVIDER_SITE_OTHER): Payer: Self-pay | Admitting: Orthopaedic Surgery

## 2017-04-22 DIAGNOSIS — Z23 Encounter for immunization: Secondary | ICD-10-CM | POA: Diagnosis not present

## 2017-04-22 DIAGNOSIS — N2581 Secondary hyperparathyroidism of renal origin: Secondary | ICD-10-CM | POA: Diagnosis not present

## 2017-04-22 DIAGNOSIS — M79672 Pain in left foot: Secondary | ICD-10-CM | POA: Insufficient documentation

## 2017-04-22 DIAGNOSIS — N186 End stage renal disease: Secondary | ICD-10-CM | POA: Diagnosis not present

## 2017-04-22 DIAGNOSIS — D630 Anemia in neoplastic disease: Secondary | ICD-10-CM | POA: Diagnosis not present
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
## 2017-04-23 DIAGNOSIS — N2581 Secondary hyperparathyroidism of renal origin: Secondary | ICD-10-CM | POA: Diagnosis not present

## 2017-04-23 DIAGNOSIS — D630 Anemia in neoplastic disease: Secondary | ICD-10-CM | POA: Diagnosis not present

## 2017-04-23 DIAGNOSIS — N186 End stage renal disease: Secondary | ICD-10-CM | POA: Diagnosis not present

## 2017-04-23 DIAGNOSIS — Z23 Encounter for immunization: Secondary | ICD-10-CM | POA: Diagnosis not present

## 2017-04-24 DIAGNOSIS — Z23 Encounter for immunization: Secondary | ICD-10-CM | POA: Diagnosis not present

## 2017-04-24 DIAGNOSIS — D630 Anemia in neoplastic disease: Secondary | ICD-10-CM | POA: Diagnosis not present

## 2017-04-24 DIAGNOSIS — N186 End stage renal disease: Secondary | ICD-10-CM | POA: Diagnosis not present

## 2017-04-24 DIAGNOSIS — N2581 Secondary hyperparathyroidism of renal origin: Secondary | ICD-10-CM | POA: Diagnosis not present

## 2017-04-25 DIAGNOSIS — N186 End stage renal disease: Secondary | ICD-10-CM | POA: Diagnosis not present

## 2017-04-25 DIAGNOSIS — D630 Anemia in neoplastic disease: Secondary | ICD-10-CM | POA: Diagnosis not present

## 2017-04-25 DIAGNOSIS — N2581 Secondary hyperparathyroidism of renal origin: Secondary | ICD-10-CM | POA: Diagnosis not present

## 2017-04-25 DIAGNOSIS — Z23 Encounter for immunization: Secondary | ICD-10-CM | POA: Diagnosis not present

## 2017-04-26 DIAGNOSIS — N2581 Secondary hyperparathyroidism of renal origin: Secondary | ICD-10-CM | POA: Diagnosis not present

## 2017-04-26 DIAGNOSIS — D630 Anemia in neoplastic disease: Secondary | ICD-10-CM | POA: Diagnosis not present

## 2017-04-26 DIAGNOSIS — N186 End stage renal disease: Secondary | ICD-10-CM | POA: Diagnosis not present

## 2017-04-26 DIAGNOSIS — Z23 Encounter for immunization: Secondary | ICD-10-CM | POA: Diagnosis not present

## 2017-04-27 DIAGNOSIS — D630 Anemia in neoplastic disease: Secondary | ICD-10-CM | POA: Diagnosis not present

## 2017-04-27 DIAGNOSIS — N2581 Secondary hyperparathyroidism of renal origin: Secondary | ICD-10-CM | POA: Diagnosis not present

## 2017-04-27 DIAGNOSIS — N186 End stage renal disease: Secondary | ICD-10-CM | POA: Diagnosis not present

## 2017-04-27 DIAGNOSIS — Z23 Encounter for immunization: Secondary | ICD-10-CM | POA: Diagnosis not present

## 2017-04-28 DIAGNOSIS — D630 Anemia in neoplastic disease: Secondary | ICD-10-CM | POA: Diagnosis not present

## 2017-04-28 DIAGNOSIS — N2581 Secondary hyperparathyroidism of renal origin: Secondary | ICD-10-CM | POA: Diagnosis not present

## 2017-04-28 DIAGNOSIS — N186 End stage renal disease: Secondary | ICD-10-CM | POA: Diagnosis not present

## 2017-04-28 DIAGNOSIS — Z23 Encounter for immunization: Secondary | ICD-10-CM | POA: Diagnosis not present

## 2017-04-29 DIAGNOSIS — D631 Anemia in chronic kidney disease: Secondary | ICD-10-CM | POA: Diagnosis not present

## 2017-04-29 DIAGNOSIS — Z4932 Encounter for adequacy testing for peritoneal dialysis: Secondary | ICD-10-CM | POA: Diagnosis not present

## 2017-04-29 DIAGNOSIS — I7789 Other specified disorders of arteries and arterioles: Secondary | ICD-10-CM | POA: Diagnosis not present

## 2017-04-29 DIAGNOSIS — N2581 Secondary hyperparathyroidism of renal origin: Secondary | ICD-10-CM | POA: Diagnosis not present

## 2017-04-29 DIAGNOSIS — N186 End stage renal disease: Secondary | ICD-10-CM | POA: Diagnosis not present

## 2017-04-29 DIAGNOSIS — Z992 Dependence on renal dialysis: Secondary | ICD-10-CM | POA: Diagnosis not present

## 2017-04-29 DIAGNOSIS — D509 Iron deficiency anemia, unspecified: Secondary | ICD-10-CM | POA: Diagnosis not present

## 2017-04-30 DIAGNOSIS — Z4932 Encounter for adequacy testing for peritoneal dialysis: Secondary | ICD-10-CM | POA: Diagnosis not present

## 2017-04-30 DIAGNOSIS — D509 Iron deficiency anemia, unspecified: Secondary | ICD-10-CM | POA: Diagnosis not present

## 2017-04-30 DIAGNOSIS — D631 Anemia in chronic kidney disease: Secondary | ICD-10-CM | POA: Diagnosis not present

## 2017-04-30 DIAGNOSIS — N2581 Secondary hyperparathyroidism of renal origin: Secondary | ICD-10-CM | POA: Diagnosis not present

## 2017-04-30 DIAGNOSIS — N186 End stage renal disease: Secondary | ICD-10-CM | POA: Diagnosis not present

## 2017-05-01 DIAGNOSIS — N2581 Secondary hyperparathyroidism of renal origin: Secondary | ICD-10-CM | POA: Diagnosis not present

## 2017-05-01 DIAGNOSIS — N186 End stage renal disease: Secondary | ICD-10-CM | POA: Diagnosis not present

## 2017-05-01 DIAGNOSIS — D631 Anemia in chronic kidney disease: Secondary | ICD-10-CM | POA: Diagnosis not present

## 2017-05-01 DIAGNOSIS — D509 Iron deficiency anemia, unspecified: Secondary | ICD-10-CM | POA: Diagnosis not present

## 2017-05-01 DIAGNOSIS — Z4932 Encounter for adequacy testing for peritoneal dialysis: Secondary | ICD-10-CM | POA: Diagnosis not present

## 2017-05-02 DIAGNOSIS — D509 Iron deficiency anemia, unspecified: Secondary | ICD-10-CM | POA: Diagnosis not present

## 2017-05-02 DIAGNOSIS — Z4932 Encounter for adequacy testing for peritoneal dialysis: Secondary | ICD-10-CM | POA: Diagnosis not present

## 2017-05-02 DIAGNOSIS — N186 End stage renal disease: Secondary | ICD-10-CM | POA: Diagnosis not present

## 2017-05-02 DIAGNOSIS — E7849 Other hyperlipidemia: Secondary | ICD-10-CM | POA: Diagnosis not present

## 2017-05-02 DIAGNOSIS — D631 Anemia in chronic kidney disease: Secondary | ICD-10-CM | POA: Diagnosis not present

## 2017-05-02 DIAGNOSIS — N2581 Secondary hyperparathyroidism of renal origin: Secondary | ICD-10-CM | POA: Diagnosis not present

## 2017-05-03 DIAGNOSIS — D631 Anemia in chronic kidney disease: Secondary | ICD-10-CM | POA: Diagnosis not present

## 2017-05-03 DIAGNOSIS — Z4932 Encounter for adequacy testing for peritoneal dialysis: Secondary | ICD-10-CM | POA: Diagnosis not present

## 2017-05-03 DIAGNOSIS — N186 End stage renal disease: Secondary | ICD-10-CM | POA: Diagnosis not present

## 2017-05-03 DIAGNOSIS — N2581 Secondary hyperparathyroidism of renal origin: Secondary | ICD-10-CM | POA: Diagnosis not present

## 2017-05-03 DIAGNOSIS — D509 Iron deficiency anemia, unspecified: Secondary | ICD-10-CM | POA: Diagnosis not present

## 2017-05-04 DIAGNOSIS — N2581 Secondary hyperparathyroidism of renal origin: Secondary | ICD-10-CM | POA: Diagnosis not present

## 2017-05-04 DIAGNOSIS — N186 End stage renal disease: Secondary | ICD-10-CM | POA: Diagnosis not present

## 2017-05-04 DIAGNOSIS — D631 Anemia in chronic kidney disease: Secondary | ICD-10-CM | POA: Diagnosis not present

## 2017-05-04 DIAGNOSIS — Z4932 Encounter for adequacy testing for peritoneal dialysis: Secondary | ICD-10-CM | POA: Diagnosis not present

## 2017-05-04 DIAGNOSIS — D509 Iron deficiency anemia, unspecified: Secondary | ICD-10-CM | POA: Diagnosis not present

## 2017-05-05 DIAGNOSIS — D509 Iron deficiency anemia, unspecified: Secondary | ICD-10-CM | POA: Diagnosis not present

## 2017-05-05 DIAGNOSIS — D631 Anemia in chronic kidney disease: Secondary | ICD-10-CM | POA: Diagnosis not present

## 2017-05-05 DIAGNOSIS — N186 End stage renal disease: Secondary | ICD-10-CM | POA: Diagnosis not present

## 2017-05-05 DIAGNOSIS — Z4932 Encounter for adequacy testing for peritoneal dialysis: Secondary | ICD-10-CM | POA: Diagnosis not present

## 2017-05-05 DIAGNOSIS — N2581 Secondary hyperparathyroidism of renal origin: Secondary | ICD-10-CM | POA: Diagnosis not present

## 2017-05-06 DIAGNOSIS — N186 End stage renal disease: Secondary | ICD-10-CM | POA: Diagnosis not present

## 2017-05-06 DIAGNOSIS — D631 Anemia in chronic kidney disease: Secondary | ICD-10-CM | POA: Diagnosis not present

## 2017-05-06 DIAGNOSIS — N2581 Secondary hyperparathyroidism of renal origin: Secondary | ICD-10-CM | POA: Diagnosis not present

## 2017-05-06 DIAGNOSIS — Z4932 Encounter for adequacy testing for peritoneal dialysis: Secondary | ICD-10-CM | POA: Diagnosis not present

## 2017-05-06 DIAGNOSIS — D509 Iron deficiency anemia, unspecified: Secondary | ICD-10-CM | POA: Diagnosis not present

## 2017-05-07 DIAGNOSIS — D509 Iron deficiency anemia, unspecified: Secondary | ICD-10-CM | POA: Diagnosis not present

## 2017-05-07 DIAGNOSIS — Z4932 Encounter for adequacy testing for peritoneal dialysis: Secondary | ICD-10-CM | POA: Diagnosis not present

## 2017-05-07 DIAGNOSIS — D631 Anemia in chronic kidney disease: Secondary | ICD-10-CM | POA: Diagnosis not present

## 2017-05-07 DIAGNOSIS — N2581 Secondary hyperparathyroidism of renal origin: Secondary | ICD-10-CM | POA: Diagnosis not present

## 2017-05-07 DIAGNOSIS — N186 End stage renal disease: Secondary | ICD-10-CM | POA: Diagnosis not present

## 2017-05-08 DIAGNOSIS — Z4932 Encounter for adequacy testing for peritoneal dialysis: Secondary | ICD-10-CM | POA: Diagnosis not present

## 2017-05-08 DIAGNOSIS — D631 Anemia in chronic kidney disease: Secondary | ICD-10-CM | POA: Diagnosis not present

## 2017-05-08 DIAGNOSIS — N186 End stage renal disease: Secondary | ICD-10-CM | POA: Diagnosis not present

## 2017-05-08 DIAGNOSIS — N2581 Secondary hyperparathyroidism of renal origin: Secondary | ICD-10-CM | POA: Diagnosis not present

## 2017-05-08 DIAGNOSIS — D509 Iron deficiency anemia, unspecified: Secondary | ICD-10-CM | POA: Diagnosis not present

## 2017-05-09 DIAGNOSIS — N186 End stage renal disease: Secondary | ICD-10-CM | POA: Diagnosis not present

## 2017-05-09 DIAGNOSIS — Z4932 Encounter for adequacy testing for peritoneal dialysis: Secondary | ICD-10-CM | POA: Diagnosis not present

## 2017-05-09 DIAGNOSIS — D631 Anemia in chronic kidney disease: Secondary | ICD-10-CM | POA: Diagnosis not present

## 2017-05-09 DIAGNOSIS — N2581 Secondary hyperparathyroidism of renal origin: Secondary | ICD-10-CM | POA: Diagnosis not present

## 2017-05-09 DIAGNOSIS — D509 Iron deficiency anemia, unspecified: Secondary | ICD-10-CM | POA: Diagnosis not present

## 2017-05-10 DIAGNOSIS — Z4932 Encounter for adequacy testing for peritoneal dialysis: Secondary | ICD-10-CM | POA: Diagnosis not present

## 2017-05-10 DIAGNOSIS — N2581 Secondary hyperparathyroidism of renal origin: Secondary | ICD-10-CM | POA: Diagnosis not present

## 2017-05-10 DIAGNOSIS — D631 Anemia in chronic kidney disease: Secondary | ICD-10-CM | POA: Diagnosis not present

## 2017-05-10 DIAGNOSIS — N186 End stage renal disease: Secondary | ICD-10-CM | POA: Diagnosis not present

## 2017-05-10 DIAGNOSIS — D509 Iron deficiency anemia, unspecified: Secondary | ICD-10-CM | POA: Diagnosis not present

## 2017-05-11 DIAGNOSIS — N186 End stage renal disease: Secondary | ICD-10-CM | POA: Diagnosis not present

## 2017-05-11 DIAGNOSIS — N2581 Secondary hyperparathyroidism of renal origin: Secondary | ICD-10-CM | POA: Diagnosis not present

## 2017-05-11 DIAGNOSIS — D509 Iron deficiency anemia, unspecified: Secondary | ICD-10-CM | POA: Diagnosis not present

## 2017-05-11 DIAGNOSIS — D631 Anemia in chronic kidney disease: Secondary | ICD-10-CM | POA: Diagnosis not present

## 2017-05-11 DIAGNOSIS — Z4932 Encounter for adequacy testing for peritoneal dialysis: Secondary | ICD-10-CM | POA: Diagnosis not present

## 2017-05-12 DIAGNOSIS — D631 Anemia in chronic kidney disease: Secondary | ICD-10-CM | POA: Diagnosis not present

## 2017-05-12 DIAGNOSIS — D509 Iron deficiency anemia, unspecified: Secondary | ICD-10-CM | POA: Diagnosis not present

## 2017-05-12 DIAGNOSIS — Z4932 Encounter for adequacy testing for peritoneal dialysis: Secondary | ICD-10-CM | POA: Diagnosis not present

## 2017-05-12 DIAGNOSIS — N186 End stage renal disease: Secondary | ICD-10-CM | POA: Diagnosis not present

## 2017-05-12 DIAGNOSIS — N2581 Secondary hyperparathyroidism of renal origin: Secondary | ICD-10-CM | POA: Diagnosis not present

## 2017-05-13 DIAGNOSIS — D509 Iron deficiency anemia, unspecified: Secondary | ICD-10-CM | POA: Diagnosis not present

## 2017-05-13 DIAGNOSIS — N186 End stage renal disease: Secondary | ICD-10-CM | POA: Diagnosis not present

## 2017-05-13 DIAGNOSIS — N2581 Secondary hyperparathyroidism of renal origin: Secondary | ICD-10-CM | POA: Diagnosis not present

## 2017-05-13 DIAGNOSIS — Z4932 Encounter for adequacy testing for peritoneal dialysis: Secondary | ICD-10-CM | POA: Diagnosis not present

## 2017-05-13 DIAGNOSIS — D631 Anemia in chronic kidney disease: Secondary | ICD-10-CM | POA: Diagnosis not present

## 2017-05-14 DIAGNOSIS — Z4932 Encounter for adequacy testing for peritoneal dialysis: Secondary | ICD-10-CM | POA: Diagnosis not present

## 2017-05-14 DIAGNOSIS — N186 End stage renal disease: Secondary | ICD-10-CM | POA: Diagnosis not present

## 2017-05-14 DIAGNOSIS — D631 Anemia in chronic kidney disease: Secondary | ICD-10-CM | POA: Diagnosis not present

## 2017-05-14 DIAGNOSIS — D509 Iron deficiency anemia, unspecified: Secondary | ICD-10-CM | POA: Diagnosis not present

## 2017-05-14 DIAGNOSIS — N2581 Secondary hyperparathyroidism of renal origin: Secondary | ICD-10-CM | POA: Diagnosis not present

## 2017-05-15 DIAGNOSIS — N186 End stage renal disease: Secondary | ICD-10-CM | POA: Diagnosis not present

## 2017-05-15 DIAGNOSIS — N2581 Secondary hyperparathyroidism of renal origin: Secondary | ICD-10-CM | POA: Diagnosis not present

## 2017-05-15 DIAGNOSIS — Z4932 Encounter for adequacy testing for peritoneal dialysis: Secondary | ICD-10-CM | POA: Diagnosis not present

## 2017-05-15 DIAGNOSIS — D509 Iron deficiency anemia, unspecified: Secondary | ICD-10-CM | POA: Diagnosis not present

## 2017-05-15 DIAGNOSIS — D631 Anemia in chronic kidney disease: Secondary | ICD-10-CM | POA: Diagnosis not present

## 2017-05-16 DIAGNOSIS — D509 Iron deficiency anemia, unspecified: Secondary | ICD-10-CM | POA: Diagnosis not present

## 2017-05-16 DIAGNOSIS — N2581 Secondary hyperparathyroidism of renal origin: Secondary | ICD-10-CM | POA: Diagnosis not present

## 2017-05-16 DIAGNOSIS — Z4932 Encounter for adequacy testing for peritoneal dialysis: Secondary | ICD-10-CM | POA: Diagnosis not present

## 2017-05-16 DIAGNOSIS — N186 End stage renal disease: Secondary | ICD-10-CM | POA: Diagnosis not present

## 2017-05-16 DIAGNOSIS — D631 Anemia in chronic kidney disease: Secondary | ICD-10-CM | POA: Diagnosis not present

## 2017-05-17 DIAGNOSIS — D631 Anemia in chronic kidney disease: Secondary | ICD-10-CM | POA: Diagnosis not present

## 2017-05-17 DIAGNOSIS — N186 End stage renal disease: Secondary | ICD-10-CM | POA: Diagnosis not present

## 2017-05-17 DIAGNOSIS — N2581 Secondary hyperparathyroidism of renal origin: Secondary | ICD-10-CM | POA: Diagnosis not present

## 2017-05-17 DIAGNOSIS — D509 Iron deficiency anemia, unspecified: Secondary | ICD-10-CM | POA: Diagnosis not present

## 2017-05-17 DIAGNOSIS — Z4932 Encounter for adequacy testing for peritoneal dialysis: Secondary | ICD-10-CM | POA: Diagnosis not present

## 2017-05-18 DIAGNOSIS — N2581 Secondary hyperparathyroidism of renal origin: Secondary | ICD-10-CM | POA: Diagnosis not present

## 2017-05-18 DIAGNOSIS — N186 End stage renal disease: Secondary | ICD-10-CM | POA: Diagnosis not present

## 2017-05-18 DIAGNOSIS — D631 Anemia in chronic kidney disease: Secondary | ICD-10-CM | POA: Diagnosis not present

## 2017-05-18 DIAGNOSIS — Z4932 Encounter for adequacy testing for peritoneal dialysis: Secondary | ICD-10-CM | POA: Diagnosis not present

## 2017-05-18 DIAGNOSIS — D509 Iron deficiency anemia, unspecified: Secondary | ICD-10-CM | POA: Diagnosis not present

## 2017-05-19 DIAGNOSIS — N2581 Secondary hyperparathyroidism of renal origin: Secondary | ICD-10-CM | POA: Diagnosis not present

## 2017-05-19 DIAGNOSIS — Z4932 Encounter for adequacy testing for peritoneal dialysis: Secondary | ICD-10-CM | POA: Diagnosis not present

## 2017-05-19 DIAGNOSIS — D631 Anemia in chronic kidney disease: Secondary | ICD-10-CM | POA: Diagnosis not present

## 2017-05-19 DIAGNOSIS — N186 End stage renal disease: Secondary | ICD-10-CM | POA: Diagnosis not present

## 2017-05-19 DIAGNOSIS — D509 Iron deficiency anemia, unspecified: Secondary | ICD-10-CM | POA: Diagnosis not present

## 2017-05-20 ENCOUNTER — Ambulatory Visit (INDEPENDENT_AMBULATORY_CARE_PROVIDER_SITE_OTHER): Payer: Medicare Other | Admitting: Family

## 2017-05-20 ENCOUNTER — Encounter: Payer: Self-pay | Admitting: Family

## 2017-05-20 VITALS — BP 125/66 | HR 72 | Temp 99.0°F | Ht 63.0 in | Wt 135.0 lb

## 2017-05-20 DIAGNOSIS — D509 Iron deficiency anemia, unspecified: Secondary | ICD-10-CM | POA: Diagnosis not present

## 2017-05-20 DIAGNOSIS — N2581 Secondary hyperparathyroidism of renal origin: Secondary | ICD-10-CM | POA: Diagnosis not present

## 2017-05-20 DIAGNOSIS — N3001 Acute cystitis with hematuria: Secondary | ICD-10-CM | POA: Diagnosis not present

## 2017-05-20 DIAGNOSIS — D631 Anemia in chronic kidney disease: Secondary | ICD-10-CM | POA: Diagnosis not present

## 2017-05-20 DIAGNOSIS — N186 End stage renal disease: Secondary | ICD-10-CM | POA: Diagnosis not present

## 2017-05-20 DIAGNOSIS — R4182 Altered mental status, unspecified: Secondary | ICD-10-CM | POA: Diagnosis not present

## 2017-05-20 DIAGNOSIS — Z4932 Encounter for adequacy testing for peritoneal dialysis: Secondary | ICD-10-CM | POA: Diagnosis not present

## 2017-05-20 MED ORDER — CIPROFLOXACIN HCL 250 MG PO TABS: 250.0000 mg | ORAL_TABLET | Freq: Two times a day (BID) | ORAL | 0 refills | Status: DC

## 2017-05-20 NOTE — Progress Notes (Signed)
   Subjective:    Patient ID: Darren Dunn, male    DOB: 07-Jan-1946, 72 y.o.   MRN: [id number]    HPI Pt presents to the office today with son today for alter mental status change that started about a week ago. Son states last year he had the same symptoms and he was diagnosed with a UTI. PT is on dialysis daily. He is unable to leave a sample.    Review of Systems  All other systems reviewed and are negative.      Objective:   Physical Exam  Constitutional: He is oriented to person, place, and time. He appears well-developed and well-nourished. No distress.  HENT:  Head: Normocephalic.  Eyes: Pupils are equal, round, and reactive to light. Right eye exhibits no discharge. Left eye exhibits no discharge.  Neck: Normal range of motion. Neck supple. No thyromegaly present.  Cardiovascular: Normal rate, regular rhythm, normal heart sounds and intact distal pulses.  No murmur heard. Pulmonary/Chest: Effort normal and breath sounds normal. No respiratory distress. He has no wheezes.  Abdominal: Soft. Bowel sounds are normal. He exhibits no distension. There is no tenderness.  Musculoskeletal: Normal range of motion. He exhibits edema (2+ BLE). He exhibits no tenderness.  Neurological: He is alert and oriented to person, place, and time.  Skin: Skin is warm and dry. No rash noted. No erythema.  Psychiatric: He has a normal mood and affect. His behavior is normal. Judgment and thought content normal.  Vitals reviewed.  Straight cath patient with sterile technique and had approx 50 ml of dark cloudy urine.  BP 125/66   Pulse 72   Temp 99 F (37.2 C) (Oral)   Ht 5\' 3"  (1.6 m)   Wt 135 lb (61.2 kg)   BMI 23.91 kg/m      Assessment & Plan:  1. Altered mental status, unspecified altered mental status type - Urinalysis, Complete - Urine Culture  2. Acute cystitis with hematuria Will treat today with cipro Keep all appts with Nephrologists Culture pending  RTO prn or if  symptoms worsen and do not improve - ciprofloxacin (CIPRO) 250 MG tablet; Take 1 tablet (250 mg total) by mouth 2 (two) times daily.  Dispense: 14 tablet; Refill: 0     Evelina Dun, FNP

## 2017-05-20 NOTE — Patient Instructions (Signed)
Urinary Tract Infection, Adult °A urinary tract infection (UTI) is an infection of any part of the urinary tract. The urinary tract includes the: °· Kidneys. °· Ureters. °· Bladder. °· Urethra. ° °These organs make, store, and get rid of pee (urine) in the body. °Follow these instructions at home: °· Take over-the-counter and prescription medicines only as told by your doctor. °· If you were prescribed an antibiotic medicine, take it as told by your doctor. Do not stop taking the antibiotic even if you start to feel better. °· Avoid the following drinks: °? Alcohol. °? Caffeine. °? Tea. °? Carbonated drinks. °· Drink enough fluid to keep your pee clear or pale yellow. °· Keep all follow-up visits as told by your doctor. This is important. °· Make sure to: °? Empty your bladder often and completely. Do not to hold pee for long periods of time. °? Empty your bladder before and after sex. °? Wipe from front to back after a bowel movement if you are male. Use each tissue one time when you wipe. °Contact a doctor if: °· You have back pain. °· You have a fever. °· You feel sick to your stomach (nauseous). °· You throw up (vomit). °· Your symptoms do not get better after 3 days. °· Your symptoms go away and then come back. °Get help right away if: °· You have very bad back pain. °· You have very bad lower belly (abdominal) pain. °· You are throwing up and cannot keep down any medicines or water. °This information is not intended to replace advice given to you by your health care provider. Make sure you discuss any questions you have with your health care provider. °Document Released: 07/04/2007 Document Revised: 06/23/2015 Document Reviewed: 12/06/2014 °Elsevier Interactive Patient Education © 2018 [employer] ° °

## 2017-05-21 ENCOUNTER — Ambulatory Visit: Payer: Medicare Other | Admitting: Family Medicine

## 2017-05-21 DIAGNOSIS — D631 Anemia in chronic kidney disease: Secondary | ICD-10-CM | POA: Diagnosis not present

## 2017-05-21 DIAGNOSIS — N186 End stage renal disease: Secondary | ICD-10-CM | POA: Diagnosis not present

## 2017-05-21 DIAGNOSIS — N2581 Secondary hyperparathyroidism of renal origin: Secondary | ICD-10-CM | POA: Diagnosis not present

## 2017-05-21 DIAGNOSIS — D509 Iron deficiency anemia, unspecified: Secondary | ICD-10-CM | POA: Diagnosis not present

## 2017-05-21 DIAGNOSIS — Z4932 Encounter for adequacy testing for peritoneal dialysis: Secondary | ICD-10-CM | POA: Diagnosis not present

## 2017-05-21 LAB — URINE CULTURE: Organism ID, Bacteria: NO GROWTH

## 2017-05-21 LAB — URINALYSIS, COMPLETE
BILIRUBIN UA: NEGATIVE
KETONES UA: NEGATIVE
LEUKOCYTES UA: NEGATIVE
Nitrite, UA: NEGATIVE
PH UA: 5.5 (ref 5.0–7.5)
Specific Gravity, UA: <1.005 — ABNORMAL LOW (ref 1.005–1.030)
UUROB: 0.2 mg/dL (ref 0.2–1.0)

## 2017-05-21 LAB — MICROSCOPIC EXAMINATION: Renal Epithel, UA: NONE SEEN /hpf

## 2017-05-22 DIAGNOSIS — D509 Iron deficiency anemia, unspecified: Secondary | ICD-10-CM | POA: Diagnosis not present

## 2017-05-22 DIAGNOSIS — D631 Anemia in chronic kidney disease: Secondary | ICD-10-CM | POA: Diagnosis not present

## 2017-05-22 DIAGNOSIS — N186 End stage renal disease: Secondary | ICD-10-CM | POA: Diagnosis not present

## 2017-05-22 DIAGNOSIS — N2581 Secondary hyperparathyroidism of renal origin: Secondary | ICD-10-CM | POA: Diagnosis not present

## 2017-05-22 DIAGNOSIS — Z4932 Encounter for adequacy testing for peritoneal dialysis: Secondary | ICD-10-CM | POA: Diagnosis not present

## 2017-05-23 DIAGNOSIS — D631 Anemia in chronic kidney disease: Secondary | ICD-10-CM | POA: Diagnosis not present

## 2017-05-23 DIAGNOSIS — N186 End stage renal disease: Secondary | ICD-10-CM | POA: Diagnosis not present

## 2017-05-23 DIAGNOSIS — N2581 Secondary hyperparathyroidism of renal origin: Secondary | ICD-10-CM | POA: Diagnosis not present

## 2017-05-23 DIAGNOSIS — Z4932 Encounter for adequacy testing for peritoneal dialysis: Secondary | ICD-10-CM | POA: Diagnosis not present

## 2017-05-23 DIAGNOSIS — D509 Iron deficiency anemia, unspecified: Secondary | ICD-10-CM | POA: Diagnosis not present

## 2017-05-24 DIAGNOSIS — N186 End stage renal disease: Secondary | ICD-10-CM | POA: Diagnosis not present

## 2017-05-24 DIAGNOSIS — D631 Anemia in chronic kidney disease: Secondary | ICD-10-CM | POA: Diagnosis not present

## 2017-05-24 DIAGNOSIS — D509 Iron deficiency anemia, unspecified: Secondary | ICD-10-CM | POA: Diagnosis not present

## 2017-05-24 DIAGNOSIS — Z4932 Encounter for adequacy testing for peritoneal dialysis: Secondary | ICD-10-CM | POA: Diagnosis not present

## 2017-05-24 DIAGNOSIS — N2581 Secondary hyperparathyroidism of renal origin: Secondary | ICD-10-CM | POA: Diagnosis not present

## 2017-05-25 DIAGNOSIS — Z4932 Encounter for adequacy testing for peritoneal dialysis: Secondary | ICD-10-CM | POA: Diagnosis not present

## 2017-05-25 DIAGNOSIS — D631 Anemia in chronic kidney disease: Secondary | ICD-10-CM | POA: Diagnosis not present

## 2017-05-25 DIAGNOSIS — N186 End stage renal disease: Secondary | ICD-10-CM | POA: Diagnosis not present

## 2017-05-25 DIAGNOSIS — N2581 Secondary hyperparathyroidism of renal origin: Secondary | ICD-10-CM | POA: Diagnosis not present

## 2017-05-25 DIAGNOSIS — D509 Iron deficiency anemia, unspecified: Secondary | ICD-10-CM | POA: Diagnosis not present

## 2017-05-26 DIAGNOSIS — Z4932 Encounter for adequacy testing for peritoneal dialysis: Secondary | ICD-10-CM | POA: Diagnosis not present

## 2017-05-26 DIAGNOSIS — N2581 Secondary hyperparathyroidism of renal origin: Secondary | ICD-10-CM | POA: Diagnosis not present

## 2017-05-26 DIAGNOSIS — D631 Anemia in chronic kidney disease: Secondary | ICD-10-CM | POA: Diagnosis not present

## 2017-05-26 DIAGNOSIS — D509 Iron deficiency anemia, unspecified: Secondary | ICD-10-CM | POA: Diagnosis not present

## 2017-05-26 DIAGNOSIS — N186 End stage renal disease: Secondary | ICD-10-CM | POA: Diagnosis not present

## 2017-05-27 ENCOUNTER — Encounter: Payer: Self-pay | Admitting: Physician Assistant

## 2017-05-27 ENCOUNTER — Ambulatory Visit (INDEPENDENT_AMBULATORY_CARE_PROVIDER_SITE_OTHER): Payer: Medicare Other | Admitting: Physician Assistant

## 2017-05-27 VITALS — BP 148/66 | HR 70 | Ht 63.0 in | Wt 141.0 lb

## 2017-05-27 DIAGNOSIS — D631 Anemia in chronic kidney disease: Secondary | ICD-10-CM | POA: Diagnosis not present

## 2017-05-27 DIAGNOSIS — Z87448 Personal history of other diseases of urinary system: Secondary | ICD-10-CM | POA: Diagnosis not present

## 2017-05-27 DIAGNOSIS — Z4932 Encounter for adequacy testing for peritoneal dialysis: Secondary | ICD-10-CM | POA: Diagnosis not present

## 2017-05-27 DIAGNOSIS — N2581 Secondary hyperparathyroidism of renal origin: Secondary | ICD-10-CM | POA: Diagnosis not present

## 2017-05-27 DIAGNOSIS — R4182 Altered mental status, unspecified: Secondary | ICD-10-CM

## 2017-05-27 DIAGNOSIS — D509 Iron deficiency anemia, unspecified: Secondary | ICD-10-CM | POA: Diagnosis not present

## 2017-05-27 DIAGNOSIS — J159 Unspecified bacterial pneumonia: Secondary | ICD-10-CM | POA: Diagnosis not present

## 2017-05-27 DIAGNOSIS — N186 End stage renal disease: Secondary | ICD-10-CM | POA: Diagnosis not present

## 2017-05-27 MED ORDER — CEFTRIAXONE SODIUM 1 G IJ SOLR
1.0000 g | Freq: Once | INTRAMUSCULAR | Status: AC
  Administered 2017-05-27: 1 g via INTRAMUSCULAR

## 2017-05-28 ENCOUNTER — Observation Stay (HOSPITAL_COMMUNITY)
Admission: EM | Admit: 2017-05-28 | Discharge: 2017-05-30 | Disposition: A | Discharge status: Home Health | Payer: Medicare Other | Attending: Family Medicine | Admitting: Family Medicine

## 2017-05-28 ENCOUNTER — Emergency Department (HOSPITAL_COMMUNITY): Payer: Medicare Other

## 2017-05-28 ENCOUNTER — Other Ambulatory Visit: Payer: Self-pay

## 2017-05-28 ENCOUNTER — Encounter (HOSPITAL_COMMUNITY): Payer: Self-pay | Admitting: Emergency Medicine

## 2017-05-28 DIAGNOSIS — R2681 Unsteadiness on feet: Secondary | ICD-10-CM | POA: Diagnosis not present

## 2017-05-28 DIAGNOSIS — R5383 Other fatigue: Secondary | ICD-10-CM | POA: Diagnosis not present

## 2017-05-28 DIAGNOSIS — R4781 Slurred speech: Secondary | ICD-10-CM | POA: Diagnosis not present

## 2017-05-28 DIAGNOSIS — Z87891 Personal history of nicotine dependence: Secondary | ICD-10-CM | POA: Insufficient documentation

## 2017-05-28 DIAGNOSIS — Z992 Dependence on renal dialysis: Secondary | ICD-10-CM | POA: Insufficient documentation

## 2017-05-28 DIAGNOSIS — E876 Hypokalemia: Secondary | ICD-10-CM | POA: Diagnosis present

## 2017-05-28 DIAGNOSIS — E8770 Fluid overload, unspecified: Secondary | ICD-10-CM | POA: Insufficient documentation

## 2017-05-28 DIAGNOSIS — R531 Weakness: Secondary | ICD-10-CM | POA: Diagnosis not present

## 2017-05-28 DIAGNOSIS — N186 End stage renal disease: Secondary | ICD-10-CM | POA: Diagnosis not present

## 2017-05-28 DIAGNOSIS — Z79890 Hormone replacement therapy (postmenopausal): Secondary | ICD-10-CM | POA: Diagnosis not present

## 2017-05-28 DIAGNOSIS — Z4932 Encounter for adequacy testing for peritoneal dialysis: Secondary | ICD-10-CM | POA: Diagnosis not present

## 2017-05-28 DIAGNOSIS — I10 Essential (primary) hypertension: Secondary | ICD-10-CM | POA: Diagnosis present

## 2017-05-28 DIAGNOSIS — E871 Hypo-osmolality and hyponatremia: Secondary | ICD-10-CM | POA: Diagnosis not present

## 2017-05-28 DIAGNOSIS — D631 Anemia in chronic kidney disease: Secondary | ICD-10-CM | POA: Insufficient documentation

## 2017-05-28 DIAGNOSIS — E039 Hypothyroidism, unspecified: Secondary | ICD-10-CM | POA: Diagnosis present

## 2017-05-28 DIAGNOSIS — Z79899 Other long term (current) drug therapy: Secondary | ICD-10-CM | POA: Insufficient documentation

## 2017-05-28 DIAGNOSIS — N2581 Secondary hyperparathyroidism of renal origin: Secondary | ICD-10-CM | POA: Diagnosis not present

## 2017-05-28 DIAGNOSIS — F319 Bipolar disorder, unspecified: Secondary | ICD-10-CM | POA: Diagnosis present

## 2017-05-28 DIAGNOSIS — J90 Pleural effusion, not elsewhere classified: Secondary | ICD-10-CM | POA: Diagnosis not present

## 2017-05-28 DIAGNOSIS — I120 Hypertensive chronic kidney disease with stage 5 chronic kidney disease or end stage renal disease: Secondary | ICD-10-CM | POA: Insufficient documentation

## 2017-05-28 DIAGNOSIS — D649 Anemia, unspecified: Secondary | ICD-10-CM | POA: Diagnosis present

## 2017-05-28 DIAGNOSIS — D509 Iron deficiency anemia, unspecified: Secondary | ICD-10-CM | POA: Diagnosis not present

## 2017-05-28 LAB — COMPREHENSIVE METABOLIC PANEL
ALBUMIN: 2.4 g/dL — AB (ref 3.5–5.0)
ALT: 27 U/L (ref 17–63)
AST: 45 U/L — AB (ref 15–41)
Alkaline Phosphatase: 139 U/L — ABNORMAL HIGH (ref 38–126)
Anion gap: 15 (ref 5–15)
BUN: 43 mg/dL — AB (ref 6–20)
CHLORIDE: 77 mmol/L — AB (ref 101–111)
CO2: 26 mmol/L (ref 22–32)
Calcium: 8.5 mg/dL — ABNORMAL LOW (ref 8.9–10.3)
Creatinine, Ser: 6.23 mg/dL — ABNORMAL HIGH (ref 0.61–1.24)
GFR calc Af Amer: 9 mL/min — ABNORMAL LOW (ref >60)
GFR, EST NON AFRICAN AMERICAN: 8 mL/min — AB (ref >60)
Glucose, Bld: 99 mg/dL (ref 65–99)
POTASSIUM: 3.4 mmol/L — AB (ref 3.5–5.1)
SODIUM: 118 mmol/L — AB (ref 135–145)
Total Bilirubin: 0.4 mg/dL (ref 0.3–1.2)
Total Protein: 6 g/dL — ABNORMAL LOW (ref 6.5–8.1)

## 2017-05-28 LAB — CBC WITH DIFFERENTIAL/PLATELET
BASOS ABS: 0 10*3/uL (ref 0.0–0.2)
Basophils Absolute: 0 10*3/uL (ref 0.0–0.1)
Basophils Relative: 0 %
Basos: 0 %
EOS (ABSOLUTE): 0 10*3/uL (ref 0.0–0.4)
EOS PCT: 1 %
EOS: 0 %
Eosinophils Absolute: 0.1 10*3/uL (ref 0.0–0.7)
HCT: 30.4 % — ABNORMAL LOW (ref 39.0–52.0)
HEMATOCRIT: 31.1 % — AB (ref 37.5–51.0)
HEMOGLOBIN: 10.9 g/dL — AB (ref 13.0–17.7)
Hemoglobin: 10.3 g/dL — ABNORMAL LOW (ref 13.0–17.0)
LYMPHS ABS: 0.6 10*3/uL — AB (ref 0.7–4.0)
LYMPHS PCT: 7 %
Lymphocytes Absolute: 1 10*3/uL (ref 0.7–3.1)
Lymphs: 8 %
MCH: 30.4 pg (ref 26.6–33.0)
MCH: 30.2 pg (ref 26.0–34.0)
MCHC: 35 g/dL (ref 31.5–35.7)
MCHC: 33.9 g/dL (ref 30.0–36.0)
MCV: 87 fL (ref 79–97)
MCV: 89.1 fL (ref 78.0–100.0)
MONOCYTES: 8 %
Monocytes Absolute: 1 10*3/uL — ABNORMAL HIGH (ref 0.1–0.9)
Monocytes Absolute: 1 10*3/uL (ref 0.1–1.0)
Monocytes Relative: 11 %
NEUTROS PCT: 49 %
Neutro Abs: 6.9 10*3/uL (ref 1.7–7.7)
Neutrophils Absolute: 10 10*3/uL — ABNORMAL HIGH (ref 1.4–7.0)
Neutrophils Relative %: 81 %
PLATELETS: 137 10*3/uL — AB (ref 150–400)
Platelets: 151 10*3/uL (ref 150–379)
RBC: 3.58 x10E6/uL — AB (ref 4.14–5.80)
RBC: 3.41 MIL/uL — AB (ref 4.22–5.81)
RDW: 19.1 % — ABNORMAL HIGH (ref 12.3–15.4)
RDW: 17.4 % — ABNORMAL HIGH (ref 11.5–15.5)
WBC: 12.7 10*3/uL — ABNORMAL HIGH (ref 3.4–10.8)
WBC: 8.6 10*3/uL (ref 4.0–10.5)

## 2017-05-28 LAB — CMP14+EGFR
ALBUMIN: 3.2 g/dL — AB (ref 3.5–4.8)
ALK PHOS: 191 IU/L — AB (ref 39–117)
ALT: 28 IU/L (ref 0–44)
AST: 38 IU/L (ref 0–40)
Albumin/Globulin Ratio: 1.2 (ref 1.2–2.2)
BUN/Creatinine Ratio: 7 — ABNORMAL LOW (ref 10–24)
BUN: 47 mg/dL — AB (ref 8–27)
Bilirubin Total: 0.3 mg/dL (ref 0.0–1.2)
CALCIUM: 8.4 mg/dL — AB (ref 8.6–10.2)
CO2: 25 mmol/L (ref 20–29)
CREATININE: 6.81 mg/dL — AB (ref 0.76–1.27)
Chloride: 78 mmol/L — ABNORMAL LOW (ref 96–106)
GFR calc Af Amer: 9 mL/min/{1.73_m2} — ABNORMAL LOW (ref >59)
GFR, EST NON AFRICAN AMERICAN: 7 mL/min/{1.73_m2} — AB (ref >59)
GLUCOSE: 98 mg/dL (ref 65–99)
Globulin, Total: 2.6 g/dL (ref 1.5–4.5)
Potassium: 6.6 mmol/L (ref 3.5–5.2)
Sodium: 112 mmol/L — CL (ref 134–144)
Total Protein: 5.8 g/dL — ABNORMAL LOW (ref 6.0–8.5)

## 2017-05-28 LAB — VALPROIC ACID LEVEL: Valproic Acid Lvl: 25 ug/mL — ABNORMAL LOW (ref 50.0–100.0)

## 2017-05-28 LAB — IMMATURE CELLS
Bands(Auto) Relative: 30 %
METAMYELOCYTES: 4 % — AB (ref 0–0)
MYELOCYTES: 1 % — AB (ref 0–0)

## 2017-05-28 LAB — TSH: TSH: 4.851 u[IU]/mL — ABNORMAL HIGH (ref 0.350–4.500)

## 2017-05-28 LAB — MAGNESIUM: Magnesium: 2.3 mg/dL (ref 1.7–2.4)

## 2017-05-28 LAB — PHOSPHORUS: PHOSPHORUS: 3.1 mg/dL (ref 2.5–4.6)

## 2017-05-28 MED ORDER — SENNOSIDES-DOCUSATE SODIUM 8.6-50 MG PO TABS: 1.0000 | ORAL_TABLET | Freq: Every evening | ORAL | Status: DC | PRN

## 2017-05-28 MED ORDER — SODIUM CHLORIDE 0.9 % IV BOLUS: 500.0000 mL | Freq: Once | INTRAVENOUS | Status: DC

## 2017-05-28 MED ORDER — ONDANSETRON HCL 4 MG/2ML IJ SOLN
4.0000 mg | Freq: Four times a day (QID) | INTRAMUSCULAR | Status: DC | PRN
Start: 2017-05-28 — End: 2017-05-30

## 2017-05-28 MED ORDER — ZOLPIDEM TARTRATE 5 MG PO TABS
5.0000 mg | ORAL_TABLET | Freq: Every evening | ORAL | Status: DC | PRN
  Administered 2017-05-28 – 2017-05-29 (×2): 5 mg via ORAL
  Filled 2017-05-28 (×2): qty 1

## 2017-05-28 MED ORDER — CALCITRIOL 0.25 MCG PO CAPS
0.2500 ug | ORAL_CAPSULE | Freq: Every day | ORAL | Status: DC
  Administered 2017-05-29 – 2017-05-30 (×2): 0.25 ug via ORAL
  Filled 2017-05-28: qty 1

## 2017-05-28 MED ORDER — SODIUM CHLORIDE 0.9 % IV SOLN
INTRAVENOUS | Status: DC
  Administered 2017-05-28: 21:00:00 via INTRAVENOUS

## 2017-05-28 MED ORDER — HYDRALAZINE HCL 20 MG/ML IJ SOLN: 5.0000 mg | INTRAMUSCULAR | Status: DC | PRN

## 2017-05-28 MED ORDER — GENTAMICIN SULFATE 0.1 % EX CREA
TOPICAL_CREAM | Freq: Three times a day (TID) | CUTANEOUS | Status: DC
  Administered 2017-05-29 – 2017-05-30 (×4): via TOPICAL
  Filled 2017-05-28: qty 15

## 2017-05-28 MED ORDER — RISPERIDONE 1 MG PO TABS
1.0000 mg | ORAL_TABLET | Freq: Every day | ORAL | Status: DC
  Administered 2017-05-29: 1 mg via ORAL
  Filled 2017-05-28: qty 1

## 2017-05-28 MED ORDER — SODIUM CHLORIDE 0.9 % IV SOLN
Freq: Once | INTRAVENOUS | Status: AC
  Administered 2017-05-28: 20:00:00 via INTRAVENOUS

## 2017-05-28 MED ORDER — DIVALPROEX SODIUM 500 MG PO DR TAB
500.0000 mg | DELAYED_RELEASE_TABLET | Freq: Two times a day (BID) | ORAL | Status: DC
  Administered 2017-05-28 – 2017-05-30 (×4): 500 mg via ORAL
  Filled 2017-05-28: qty 1
  Filled 2017-05-28: qty 2
  Filled 2017-05-28 (×2): qty 1

## 2017-05-28 MED ORDER — ACETAMINOPHEN 325 MG PO TABS: 650.0000 mg | ORAL_TABLET | Freq: Four times a day (QID) | ORAL | Status: DC | PRN

## 2017-05-28 MED ORDER — RENA-VITE PO TABS
1.0000 | ORAL_TABLET | Freq: Every day | ORAL | Status: DC
  Administered 2017-05-29 (×2): 1 via ORAL
  Filled 2017-05-28 (×2): qty 1

## 2017-05-28 MED ORDER — SODIUM CHLORIDE 0.9 % IV BOLUS: 1000.0000 mL | Freq: Once | INTRAVENOUS | Status: DC

## 2017-05-28 MED ORDER — HEPARIN SODIUM (PORCINE) 5000 UNIT/ML IJ SOLN
5000.0000 [IU] | Freq: Three times a day (TID) | INTRAMUSCULAR | Status: DC
  Administered 2017-05-29 – 2017-05-30 (×4): 5000 [IU] via SUBCUTANEOUS
  Filled 2017-05-28 (×5): qty 1

## 2017-05-28 MED ORDER — LEVOTHYROXINE SODIUM 50 MCG PO TABS: 50.0000 ug | ORAL_TABLET | Freq: Every day | ORAL | Status: DC

## 2017-05-28 MED ORDER — AMLODIPINE BESYLATE 5 MG PO TABS
5.0000 mg | ORAL_TABLET | Freq: Every day | ORAL | Status: DC
  Administered 2017-05-28 – 2017-05-29 (×2): 5 mg via ORAL
  Filled 2017-05-28 (×2): qty 1

## 2017-05-28 MED ORDER — ONDANSETRON HCL 4 MG PO TABS: 4.0000 mg | ORAL_TABLET | Freq: Four times a day (QID) | ORAL | Status: DC | PRN

## 2017-05-28 MED ORDER — SODIUM CHLORIDE 0.9 % IV SOLN: Freq: Once | INTRAVENOUS | Status: DC

## 2017-05-28 MED ORDER — ACETAMINOPHEN 650 MG RE SUPP: 650.0000 mg | Freq: Four times a day (QID) | RECTAL | Status: DC | PRN

## 2017-05-28 NOTE — ED Notes (Signed)
Patient seen and examined by Dr. Graylon Gunning ( nephrologist).
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
## 2017-05-28 NOTE — Progress Notes (Signed)
Alerted floor ED RN Nolon Lennert that IV was placed in patient access arm.

## 2017-05-28 NOTE — ED Notes (Signed)
Patient transported to X-ray 
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
## 2017-05-28 NOTE — ED Provider Notes (Addendum)
Patient placed in Quick Look pathway, seen and evaluated   Chief Complaint: AMS, Abnormal labs.   HPI:   72 y.o. M of CKD, Hyponatremia, anemia, who presents for evaluation of persistent AMS and abnormal labs.  Family at bedside reports that for the last week, patient has been slightly altered and not acting like himself.  They report he is more tired than usual and is not as active.  He states he took him to the doctor and was diagnosed with UTI.  Patient was started on Cipro and given a shot of Rocephin.  He reported that he finished antibiotics without any difficulty.  Patient was seen by MD last night who did lab work.  He was called by his doctor today and told to go to the emergency department because his sodium and potassium were too high.  Patient does participate in peritoneal dialysis.  He last completed peritoneal dialysis last night.  Family denies any fevers.  Patient denies any chest pain, difficulty breathing, abdominal pain.  ROS: AMS, Abnormal lab  Physical Exam:   Gen: No distress  Neuro: Awake and Alert  Skin: Warm    Focused Exam: 5/5 strength BUE and BLE. Slurred speech and slight facial droop noted to the left (family reports that this is baseline is not new). Abdomen is soft, non-distended, non-tender. No rigidity, No guarding. No peritoneal signs.    Initiation of care has begun. The patient has been counseled on the process, plan, and necessity for staying for the completion/evaluation, and the remainder of the medical screening examination  7:05 PM: Notified Charge RN of sodium    Volanda Napoleon, PA-C 05/28/17 1723    Volanda Napoleon, PA-C 05/28/17 1905    Mesner, Corene Cornea, MD 05/30/17 [phone]

## 2017-05-28 NOTE — ED Triage Notes (Addendum)
Pt went to MD last week for concerns of UTI due to altered mental status, has been on antibiotics with no improvement of the altered mental status. Pt's family states they went back to MD last night and they drew labs.Pt was told by his MD last night that his K and NA was too high and informed to come to ED. Pt received peritoneal dialysis nightly at home.
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
## 2017-05-29 DIAGNOSIS — E871 Hypo-osmolality and hyponatremia: Secondary | ICD-10-CM | POA: Diagnosis not present

## 2017-05-29 DIAGNOSIS — Z992 Dependence on renal dialysis: Secondary | ICD-10-CM

## 2017-05-29 DIAGNOSIS — E039 Hypothyroidism, unspecified: Secondary | ICD-10-CM

## 2017-05-29 DIAGNOSIS — N186 End stage renal disease: Secondary | ICD-10-CM

## 2017-05-29 DIAGNOSIS — F319 Bipolar disorder, unspecified: Secondary | ICD-10-CM | POA: Diagnosis not present

## 2017-05-29 DIAGNOSIS — I10 Essential (primary) hypertension: Secondary | ICD-10-CM

## 2017-05-29 DIAGNOSIS — R531 Weakness: Secondary | ICD-10-CM

## 2017-05-29 DIAGNOSIS — I7789 Other specified disorders of arteries and arterioles: Secondary | ICD-10-CM | POA: Diagnosis not present

## 2017-05-29 DIAGNOSIS — E876 Hypokalemia: Secondary | ICD-10-CM

## 2017-05-29 LAB — CBC
HCT: 28.8 % — ABNORMAL LOW (ref 39.0–52.0)
HEMOGLOBIN: 9.7 g/dL — AB (ref 13.0–17.0)
MCH: 30 pg (ref 26.0–34.0)
MCHC: 33.7 g/dL (ref 30.0–36.0)
MCV: 89.2 fL (ref 78.0–100.0)
PLATELETS: 132 10*3/uL — AB (ref 150–400)
RBC: 3.23 MIL/uL — AB (ref 4.22–5.81)
RDW: 17.5 % — ABNORMAL HIGH (ref 11.5–15.5)
WBC: 5.9 10*3/uL (ref 4.0–10.5)

## 2017-05-29 LAB — BASIC METABOLIC PANEL
ANION GAP: 9 (ref 5–15)
BUN: 19 mg/dL (ref 6–20)
CALCIUM: 7.6 mg/dL — AB (ref 8.9–10.3)
CHLORIDE: 94 mmol/L — AB (ref 101–111)
CO2: 26 mmol/L (ref 22–32)
Creatinine, Ser: 3.75 mg/dL — ABNORMAL HIGH (ref 0.61–1.24)
GFR calc non Af Amer: 15 mL/min — ABNORMAL LOW (ref >60)
GFR, EST AFRICAN AMERICAN: 17 mL/min — AB (ref >60)
Glucose, Bld: 80 mg/dL (ref 65–99)
Potassium: 3.3 mmol/L — ABNORMAL LOW (ref 3.5–5.1)
SODIUM: 129 mmol/L — AB (ref 135–145)

## 2017-05-29 LAB — OSMOLALITY: OSMOLALITY: 269 mosm/kg — AB (ref 275–295)

## 2017-05-29 MED ORDER — LEVOTHYROXINE SODIUM 50 MCG PO TABS
50.0000 ug | ORAL_TABLET | Freq: Every day | ORAL | Status: DC
  Administered 2017-05-29 – 2017-05-30 (×2): 50 ug via ORAL
  Filled 2017-05-29 (×2): qty 1

## 2017-05-29 MED ORDER — POTASSIUM CHLORIDE CRYS ER 20 MEQ PO TBCR
30.0000 meq | EXTENDED_RELEASE_TABLET | Freq: Three times a day (TID) | ORAL | Status: AC
  Administered 2017-05-29 (×2): 30 meq via ORAL
  Filled 2017-05-29 (×2): qty 1

## 2017-05-29 NOTE — Care Management Note (Addendum)
Case Management Note  Patient Details  Name: Darren Dunn MRN: [id number] Date of Birth: 28-Sep-1945  Subjective/Objective:   Admitted for hyponatremia.  PCP noted.              Action/Plan: Prior to admission patient lived at home.  In to speak with patient discussed physical therapy recommendation for home health with patient.  Offered patient choice he selected [facility] for PT/OT services.  Orders for home health needed prior to discharge.  NCM will continue to follow for discharge transition needs.  Expected Discharge Date:   05/30/2017               Expected Discharge Plan:  [place]  In-House Referral:   N/A  Discharge planning Services  CM Consult  Post Acute Care Choice:  Home Health Choice offered to:  Patient   HH Arranged:  PT, OT HH Agency:  [facility]  Status of Service:  In process, will continue to follow  Additional Comments: 05/30/2017 1:15 PM faxed home health service request to [facility] at [phone].  Referral accepted per Meghan with [facility], advised patient discharging today.     Kristen Cardinal, RN 05/29/2017, 3:11 PM

## 2017-05-29 NOTE — Progress Notes (Addendum)
[facility] Progress Note  Subjective: had hd w 2L off overnight and na this am is up form 118 to 129 walked with a walker and pt assistance. He has slurred speech but I believe this is baseline.   Vitals:   05/29/17 0200 05/29/17 0309 05/29/17 0601 05/29/17 0751  BP: (!) 97/48 (!) 139/59 (!) 114/59 124/69  Pulse: 81 85 72 85  Resp: 18 16 16 18   Temp: 97.6 F (36.4 C) 98.7 F (37.1 C) 98 F (36.7 C) 98.6 F (37 C)  TempSrc: Axillary Oral    SpO2: 98% 97% 97% 100%    Inpatient medications: . amLODipine  5 mg Oral QHS  . calcitRIOL  0.25 mcg Oral Daily  . divalproex  500 mg Oral BID  . gentamicin cream   Topical TID  . heparin  5,000 Units Subcutaneous Q8H  . levothyroxine  50 mcg Oral QAC breakfast  . multivitamin  1 tablet Oral QHS  . risperiDONE  1 mg Oral QHS    acetaminophen **OR** acetaminophen, hydrALAZINE, ondansetron **OR** ondansetron (ZOFRAN) IV, senna-docusate, zolpidem  Exam:  alert no distress frail in protective boots  no jvd  chest cta bilat  cor reg no mrg  abd soft distended nontender no guarding  ext 2+ leg edema bilat, improved  lua avg w bruit  neuro alert slurred speech nonfocal gen'd weakness  Dialysis: ccpd get records      Impression: 1 hypervolemic hyponatremia: improving w hd overnight na up 129, still vol overloaded though, pt was told to drink extra fluid due to a dehydration episode which is how this happened 2 vol overload- w lower ext edema improving 3 esrd on peritoneal dialysis 4 hypokalemia 3.6 today 5 anemia ckd hb 9.7 6 bipolar d/o  7 hypertension- bp's on lower side will dc norvasc 8 bipolar d/o on risperidone  Plan - po kcl, hemodialysis again tomorrow am then may be ok for dc home to resume outpt peritoneal dialysis   Kelly Splinter MD [facility] pager [phone]   05/29/2017, 2:24 PM   Recent Labs  Lab 05/27/17 1851 05/28/17 1712 05/28/17 1949 05/29/17 0446  NA 112* 118*  --  129*   K 6.6* 3.4*  --  3.3*  CL 78* 77*  --  94*  CO2 25 26  --  26  GLUCOSE 98 99  --  80  BUN 47* 43*  --  19  CREATININE 6.81* 6.23*  --  3.75*  CALCIUM 8.4* 8.5*  --  7.6*  PHOS  --   --  3.1  --    Recent Labs  Lab 05/27/17 1851 05/28/17 1712  AST 38 45*  ALT 28 27  ALKPHOS 191* 139*  BILITOT 0.3 0.4  PROT 5.8* 6.0*  ALBUMIN 3.2* 2.4*   Recent Labs  Lab 05/27/17 1851 05/28/17 1712 05/29/17 0446  WBC 12.7* 8.6 5.9  NEUTROABS 10.0* 6.9  --   HGB 10.9* 10.3* 9.7*  HCT 31.1* 30.4* 28.8*  MCV 87 89.1 89.2  PLT 151 137* 132*   Iron/TIBC/Ferritin/ %Sat    Component Value Date/Time   IRON 95 03/06/2016 0846   TIBC 153 (L) 03/06/2016 0846   FERRITIN 974 (H) 07/26/2006 1340   IRONPCTSAT 62 (H) 03/06/2016 3818
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
## 2017-05-29 NOTE — Progress Notes (Signed)
PROGRESS NOTE    Darren Dunn  KYH:[id number] DOB: 1945/02/04 DOA: 05/28/2017 PCP: Claretta Fraise, MD   Brief Narrative: Darren Dunn is a 72 y.o. male with a history of ESRD on peritoneal dialysis, hypertension, hypothyroidism, anemia, bipolar disorder, chronic hyponatremia.  He presented with weakness and significant hyponatremia.  He has been treated initially with fluids, and then fluid restriction with the addition of a hemodialysis session.  Sodium significantly improved.  Plan for 1 more hemodialysis session prior to discharge home with home health PT and OT.   Assessment & Plan:   Principal Problem:   Hyponatremia Active Problems:   Anemia   Bipolar disorder (HCC)   Hypothyroidism   HTN (hypertension)   ESRD on peritoneal dialysis (HCC)   Hypokalemia   Generalized weakness   Severe hyponatremia Symptomatic on admission.  Likely secondary to fluid overload in a patient with ESRD.  Sodium improved with fluid restriction and hemodialysis. -Nephrology recommendations: Plan for hemodialysis on 5/2  ESRD on peritoneal dialysis -Per nephrology  Hypokalemia Corrected with hemodialysis  ?  Urinary tract infection Treated as an outpatient with fluoroquinolone.  Urine culture is pending.  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  Stable.  No evidence of bleeding.  Bipolar disorder Stable -Continue Depakote and risperidone  Hypothyroidism TSH slightly elevated.  Would be surprised if this contributed to significant hyponatremia -Continue Synthroid; outpatient follow-up  Essential hypertension -Continue amlodipine and hydralazine as needed  Generalized weakness PT and OT recommending home health  Slurred speech Chronic.  No evidence of remote stroke on CT scan from last year.   DVT prophylaxis: Heparin subcu Code Status:   Code Status: Full Code Family Communication: None at bedside Disposition Plan: Discharge home with home health PT  and OT after nephrology management of hyponatremia likely in 24 hours   Consultants:   Nephrology  Procedures:   Hemodialysis  Antimicrobials:  None   Subjective: Patient reports no issues overnight.  He feels better than yesterday.  Objective: Vitals:   05/29/17 0309 05/29/17 0601 05/29/17 0751 05/29/17 1541  BP: (!) 139/59 (!) 114/59 124/69 106/61  Pulse: 85 72 85 82  Resp: 16 16 18 18   Temp: 98.7 F (37.1 C) 98 F (36.7 C) 98.6 F (37 C) 98.3 F (36.8 C)  TempSrc: Oral     SpO2: 97% 97% 100% 100%    Intake/Output Summary (Last 24 hours) at 05/29/2017 1545 Last data filed at 05/29/2017 1404 Gross per 24 hour  Intake 420 ml  Output 2064 ml  Net -1644 ml   Filed Weights    Examination:  General exam: Appears calm and comfortable slurred speech Respiratory system: Clear to auscultation. Respiratory effort normal. Cardiovascular system: S1 & S2 heard, RRR. No murmurs, rubs, gallops or clicks. Gastrointestinal system: Abdomen is nondistended, soft and nontender. No organomegaly or masses felt. Normal bowel sounds heard. Central nervous system: Alert and oriented. No focal neurological deficits. Extremities: No edema. No calf tenderness Skin: No cyanosis. No rashes Psychiatry: Judgement and insight appear normal. Mood & affect appropriate.     Data Reviewed: I have personally reviewed following labs and imaging studies  CBC: Recent Labs  Lab 05/27/17 1851 05/28/17 1712 05/29/17 0446  WBC 12.7* 8.6 5.9  NEUTROABS 10.0* 6.9  --   HGB 10.9* 10.3* 9.7*  HCT 31.1* 30.4* 28.8*  MCV 87 89.1 89.2  PLT 151 137* 151*   Basic Metabolic Panel: Recent Labs  Lab 05/27/17 1851 05/28/17 1712 05/28/17 1949 05/29/17  0446  NA 112* 118*  --  129*  K 6.6* 3.4*  --  3.3*  CL 78* 77*  --  94*  CO2 25 26  --  26  GLUCOSE 98 99  --  80  BUN 47* 43*  --  19  CREATININE 6.81* 6.23*  --  3.75*  CALCIUM 8.4* 8.5*  --  7.6*  MG  --   --  2.3  --   PHOS  --   --  3.1   --    GFR: Estimated Creatinine Clearance: 14.5 mL/min (A) (by C-G formula based on SCr of 3.75 mg/dL (H)). Liver Function Tests: Recent Labs  Lab 05/27/17 1851 05/28/17 1712  AST 38 45*  ALT 28 27  ALKPHOS 191* 139*  BILITOT 0.3 0.4  PROT 5.8* 6.0*  ALBUMIN 3.2* 2.4*   No results for input(s): LIPASE, AMYLASE in the last 168 hours. No results for input(s): AMMONIA in the last 168 hours. Coagulation Profile: No results for input(s): INR, PROTIME in the last 168 hours. Cardiac Enzymes: No results for input(s): CKTOTAL, CKMB, CKMBINDEX, TROPONINI in the last 168 hours. BNP (last 3 results) No results for input(s): PROBNP in the last 8760 hours. HbA1C: No results for input(s): HGBA1C in the last 72 hours. CBG: No results for input(s): GLUCAP in the last 168 hours. Lipid Profile: No results for input(s): CHOL, HDL, LDLCALC, TRIG, CHOLHDL, LDLDIRECT in the last 72 hours. Thyroid Function Tests: Recent Labs    05/28/17 1949  TSH 4.851*   Anemia Panel: No results for input(s): VITAMINB12, FOLATE, FERRITIN, TIBC, IRON, RETICCTPCT in the last 72 hours. Sepsis Labs: No results for input(s): PROCALCITON, LATICACIDVEN in the last 168 hours.  Recent Results (from the past 240 hour(s))  Urine Culture     Status: None   Collection Time: 05/20/17  5:11 PM  Result Value Ref Range Status   Urine Culture, Routine Final report  Final   Organism ID, Bacteria No growth  Final  Microscopic Examination     Status: Abnormal   Collection Time: 05/20/17  5:11 PM  Result Value Ref Range Status   WBC, UA 0-5 0 - 5 /hpf Final   RBC, UA 3-10 (A) 0 - 2 /hpf Final   Epithelial Cells (non renal) 0-10 0 - 10 /hpf Final   Renal Epithel, UA None seen None seen /hpf Final   Bacteria, UA Many (A) None seen/Few Final         Radiology Studies: Dg Chest 2 View  Result Date: 05/28/2017 CLINICAL DATA:  Altered mental status. Elevated potassium and sodium. Peritoneal dialysis at home. EXAM: CHEST  - 2 VIEW COMPARISON:  08/29/2016. FINDINGS: Poor inspiration. Borderline enlarged cardiac silhouette. Mild bibasilar atelectasis. Interval small right pleural effusion. Diffuse osteopenia. Mild thoracic spine degenerative changes. IMPRESSION: 1. Interval small right pleural effusion. 2. Poor inspiration with mild bibasilar atelectasis. Electronically Signed   By: Claudie Revering M.D.   On: 05/28/2017 18:03        Scheduled Meds: . amLODipine  5 mg Oral QHS  . calcitRIOL  0.25 mcg Oral Daily  . divalproex  500 mg Oral BID  . gentamicin cream   Topical TID  . heparin  5,000 Units Subcutaneous Q8H  . levothyroxine  50 mcg Oral QAC breakfast  . multivitamin  1 tablet Oral QHS  . potassium chloride  30 mEq Oral TID  . risperiDONE  1 mg Oral QHS   Continuous Infusions:   LOS: 0 days  Cordelia Poche, MD [facility] 05/29/2017, 3:45 PM Pager: [phone]  If 7PM-7AM, please contact night-coverage [email or web address] Password [facility] 05/29/2017, 3:45 PM

## 2017-05-30 DIAGNOSIS — E440 Moderate protein-calorie malnutrition: Secondary | ICD-10-CM | POA: Diagnosis not present

## 2017-05-30 DIAGNOSIS — N186 End stage renal disease: Secondary | ICD-10-CM | POA: Diagnosis not present

## 2017-05-30 DIAGNOSIS — F319 Bipolar disorder, unspecified: Secondary | ICD-10-CM | POA: Diagnosis not present

## 2017-05-30 DIAGNOSIS — Z79899 Other long term (current) drug therapy: Secondary | ICD-10-CM | POA: Diagnosis not present

## 2017-05-30 DIAGNOSIS — D631 Anemia in chronic kidney disease: Secondary | ICD-10-CM | POA: Diagnosis not present

## 2017-05-30 DIAGNOSIS — E039 Hypothyroidism, unspecified: Secondary | ICD-10-CM | POA: Diagnosis not present

## 2017-05-30 DIAGNOSIS — R82998 Other abnormal findings in urine: Secondary | ICD-10-CM | POA: Diagnosis not present

## 2017-05-30 DIAGNOSIS — D509 Iron deficiency anemia, unspecified: Secondary | ICD-10-CM | POA: Diagnosis not present

## 2017-05-30 DIAGNOSIS — Z992 Dependence on renal dialysis: Secondary | ICD-10-CM | POA: Diagnosis not present

## 2017-05-30 DIAGNOSIS — R531 Weakness: Secondary | ICD-10-CM | POA: Diagnosis not present

## 2017-05-30 DIAGNOSIS — N2581 Secondary hyperparathyroidism of renal origin: Secondary | ICD-10-CM | POA: Diagnosis not present

## 2017-05-30 DIAGNOSIS — R17 Unspecified jaundice: Secondary | ICD-10-CM | POA: Diagnosis not present

## 2017-05-30 DIAGNOSIS — I10 Essential (primary) hypertension: Secondary | ICD-10-CM | POA: Diagnosis not present

## 2017-05-30 DIAGNOSIS — E871 Hypo-osmolality and hyponatremia: Secondary | ICD-10-CM | POA: Diagnosis not present

## 2017-05-30 LAB — BASIC METABOLIC PANEL
Anion gap: 11 (ref 5–15)
BUN: 38 mg/dL — AB (ref 6–20)
CHLORIDE: 92 mmol/L — AB (ref 101–111)
CO2: 25 mmol/L (ref 22–32)
Calcium: 8 mg/dL — ABNORMAL LOW (ref 8.9–10.3)
Creatinine, Ser: 5.83 mg/dL — ABNORMAL HIGH (ref 0.61–1.24)
GFR calc Af Amer: 10 mL/min — ABNORMAL LOW (ref >60)
GFR calc non Af Amer: 9 mL/min — ABNORMAL LOW (ref >60)
GLUCOSE: 81 mg/dL (ref 65–99)
POTASSIUM: 4.6 mmol/L (ref 3.5–5.1)
Sodium: 128 mmol/L — ABNORMAL LOW (ref 135–145)

## 2017-05-30 LAB — HEPATITIS B SURFACE ANTIBODY,QUALITATIVE: HEP B S AB: NONREACTIVE

## 2017-05-30 LAB — HEPATITIS B SURFACE ANTIGEN: HEP B S AG: NEGATIVE

## 2017-05-30 LAB — HEPATITIS B CORE ANTIBODY, TOTAL: Hep B Core Total Ab: NEGATIVE

## 2017-05-30 MED ORDER — CALCITRIOL 0.25 MCG PO CAPS
ORAL_CAPSULE | ORAL | Status: AC
  Administered 2017-05-30: 0.25 ug via ORAL
  Filled 2017-05-30: qty 1
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
## 2017-05-30 NOTE — Progress Notes (Signed)
OT Cancellation Note  Patient Details Name: Darren Dunn MRN: [id number] DOB: 1945/10/02   Cancelled Treatment:    Reason Eval/Treat Not Completed: Patient at procedure or test/ unavailable(HD).  [name] 05/30/2017, 10:07 AM  05/30/2017 Nestor Lewandowsky, OTR/L Pager: [phone]

## 2017-05-30 NOTE — Discharge Instructions (Signed)
Hyponatremia Hyponatremia is when the amount of salt (sodium) in your blood is too low. When sodium levels are low, your cells absorb extra water and they swell. The swelling happens throughout the body, but it mostly affects the brain. What are the causes? This condition may be caused by:  Heart, kidney, or liver problems.  Thyroid problems.  Adrenal gland problems.  Metabolic conditions, such as syndrome of inappropriate antidiuretic hormone (SIADH).  Severe vomiting and diarrhea.  Certain medicines or illegal drugs.  Dehydration.  Drinking too much water.  Eating a diet that is low in sodium.  Large burns on your body.  Sweating.  What increases the risk? This condition is more likely to develop in people who:  Have long-term (chronic) kidney disease.  Have heart failure.  Have a medical condition that causes frequent or excessive diarrhea.  Have metabolic conditions, such as Addison disease or SIADH.  Take certain medicines that affect the sodium and fluid balance in the blood. Some of these medicine types include: ? Diuretics. ? NSAIDs. ? Some opioid pain medicines. ? Some antidepressants. ? Some seizure prevention medicines.  What are the signs or symptoms? Symptoms of this condition include:  Nausea and vomiting.  Confusion.  Lethargy.  Agitation.  Headache.  Seizures.  Unconsciousness.  Appetite loss.  Muscle weakness and cramping.  Feeling weak or light-headed.  Having a rapid heart rate.  Fainting, in severe cases.  How is this diagnosed? This condition is diagnosed with a medical history and physical exam. You will also have other tests, including:  Blood tests.  Urine tests.  How is this treated? Treatment for this condition depends on the cause. Treatment may include:  Fluids given through an IV tube that is inserted into one of your veins.  Medicines to correct the sodium imbalance. If medicines are causing the  condition, the medicines will need to be adjusted.  Limiting water or fluid intake to get the correct sodium balance.  Follow these instructions at home:  Take medicines only as directed by your health care provider. Many medicines can make this condition worse. Talk with your health care provider about any medicines that you are currently taking.  Carefully follow a recommended diet as directed by your health care provider.  Carefully follow instructions from your health care provider about fluid restrictions.  Keep all follow-up visits as directed by your health care provider. This is important.  Do not drink alcohol. Contact a health care provider if:  You develop worsening nausea, fatigue, headache, confusion, or weakness.  Your symptoms go away and then return.  You have problems following the recommended diet. Get help right away if:  You have a seizure.  You faint.  You have ongoing diarrhea or vomiting. This information is not intended to replace advice given to you by your health care provider. Make sure you discuss any questions you have with your health care provider. Document Released: 01/05/2002 Document Revised: 06/23/2015 Document Reviewed: 02/04/2014 Elsevier Interactive Patient Education  [employer].

## 2017-05-31 ENCOUNTER — Telehealth: Payer: Self-pay | Admitting: *Deleted

## 2017-05-31 DIAGNOSIS — E871 Hypo-osmolality and hyponatremia: Secondary | ICD-10-CM | POA: Diagnosis not present

## 2017-05-31 DIAGNOSIS — D631 Anemia in chronic kidney disease: Secondary | ICD-10-CM | POA: Diagnosis not present

## 2017-05-31 DIAGNOSIS — I120 Hypertensive chronic kidney disease with stage 5 chronic kidney disease or end stage renal disease: Secondary | ICD-10-CM | POA: Diagnosis not present

## 2017-05-31 DIAGNOSIS — N2581 Secondary hyperparathyroidism of renal origin: Secondary | ICD-10-CM | POA: Diagnosis not present

## 2017-05-31 DIAGNOSIS — E440 Moderate protein-calorie malnutrition: Secondary | ICD-10-CM | POA: Diagnosis not present

## 2017-05-31 DIAGNOSIS — N186 End stage renal disease: Secondary | ICD-10-CM | POA: Diagnosis not present

## 2017-05-31 DIAGNOSIS — D509 Iron deficiency anemia, unspecified: Secondary | ICD-10-CM | POA: Diagnosis not present

## 2017-05-31 DIAGNOSIS — Z79899 Other long term (current) drug therapy: Secondary | ICD-10-CM | POA: Diagnosis not present

## 2017-05-31 NOTE — Telephone Encounter (Signed)
If he is on dialysis, BP is closely managed bby nephrologist. However, he should DC the amlodipine for now. If pt exhibits signs of instability, call 911.

## 2017-05-31 NOTE — Telephone Encounter (Signed)
Pt son, [name] aware and Timmothy Sours with [facility] aware of changes made

## 2017-06-01 DIAGNOSIS — D509 Iron deficiency anemia, unspecified: Secondary | ICD-10-CM | POA: Diagnosis not present

## 2017-06-01 DIAGNOSIS — N186 End stage renal disease: Secondary | ICD-10-CM | POA: Diagnosis not present

## 2017-06-01 DIAGNOSIS — D631 Anemia in chronic kidney disease: Secondary | ICD-10-CM | POA: Diagnosis not present

## 2017-06-01 DIAGNOSIS — Z79899 Other long term (current) drug therapy: Secondary | ICD-10-CM | POA: Diagnosis not present

## 2017-06-01 DIAGNOSIS — N2581 Secondary hyperparathyroidism of renal origin: Secondary | ICD-10-CM | POA: Diagnosis not present

## 2017-06-01 DIAGNOSIS — E440 Moderate protein-calorie malnutrition: Secondary | ICD-10-CM | POA: Diagnosis not present

## 2017-06-02 DIAGNOSIS — D509 Iron deficiency anemia, unspecified: Secondary | ICD-10-CM | POA: Diagnosis not present

## 2017-06-02 DIAGNOSIS — E440 Moderate protein-calorie malnutrition: Secondary | ICD-10-CM | POA: Diagnosis not present

## 2017-06-02 DIAGNOSIS — N2581 Secondary hyperparathyroidism of renal origin: Secondary | ICD-10-CM | POA: Diagnosis not present

## 2017-06-02 DIAGNOSIS — D631 Anemia in chronic kidney disease: Secondary | ICD-10-CM | POA: Diagnosis not present

## 2017-06-02 DIAGNOSIS — Z79899 Other long term (current) drug therapy: Secondary | ICD-10-CM | POA: Diagnosis not present

## 2017-06-02 DIAGNOSIS — N186 End stage renal disease: Secondary | ICD-10-CM | POA: Diagnosis not present

## 2017-06-03 DIAGNOSIS — N2581 Secondary hyperparathyroidism of renal origin: Secondary | ICD-10-CM | POA: Diagnosis not present

## 2017-06-03 DIAGNOSIS — E440 Moderate protein-calorie malnutrition: Secondary | ICD-10-CM | POA: Diagnosis not present

## 2017-06-03 DIAGNOSIS — Z79899 Other long term (current) drug therapy: Secondary | ICD-10-CM | POA: Diagnosis not present

## 2017-06-03 DIAGNOSIS — D631 Anemia in chronic kidney disease: Secondary | ICD-10-CM | POA: Diagnosis not present

## 2017-06-03 DIAGNOSIS — N186 End stage renal disease: Secondary | ICD-10-CM | POA: Diagnosis not present

## 2017-06-03 DIAGNOSIS — D509 Iron deficiency anemia, unspecified: Secondary | ICD-10-CM | POA: Diagnosis not present

## 2017-06-04 DIAGNOSIS — D631 Anemia in chronic kidney disease: Secondary | ICD-10-CM | POA: Diagnosis not present

## 2017-06-04 DIAGNOSIS — E7849 Other hyperlipidemia: Secondary | ICD-10-CM | POA: Diagnosis not present

## 2017-06-04 DIAGNOSIS — Z79899 Other long term (current) drug therapy: Secondary | ICD-10-CM | POA: Diagnosis not present

## 2017-06-04 DIAGNOSIS — E440 Moderate protein-calorie malnutrition: Secondary | ICD-10-CM | POA: Diagnosis not present

## 2017-06-04 DIAGNOSIS — N2581 Secondary hyperparathyroidism of renal origin: Secondary | ICD-10-CM | POA: Diagnosis not present

## 2017-06-04 DIAGNOSIS — N186 End stage renal disease: Secondary | ICD-10-CM | POA: Diagnosis not present

## 2017-06-04 DIAGNOSIS — R82998 Other abnormal findings in urine: Secondary | ICD-10-CM | POA: Diagnosis not present

## 2017-06-04 DIAGNOSIS — D509 Iron deficiency anemia, unspecified: Secondary | ICD-10-CM | POA: Diagnosis not present

## 2017-06-05 ENCOUNTER — Ambulatory Visit (INDEPENDENT_AMBULATORY_CARE_PROVIDER_SITE_OTHER): Payer: Medicare Other | Admitting: Family Medicine

## 2017-06-05 ENCOUNTER — Telehealth: Payer: Self-pay | Admitting: *Deleted

## 2017-06-05 ENCOUNTER — Encounter: Payer: Self-pay | Admitting: Family Medicine

## 2017-06-05 VITALS — BP 74/36 | HR 98 | Temp 97.5°F | Ht 63.0 in | Wt 131.2 lb

## 2017-06-05 DIAGNOSIS — N3289 Other specified disorders of bladder: Secondary | ICD-10-CM | POA: Diagnosis not present

## 2017-06-05 DIAGNOSIS — R82998 Other abnormal findings in urine: Secondary | ICD-10-CM | POA: Diagnosis not present

## 2017-06-05 DIAGNOSIS — D509 Iron deficiency anemia, unspecified: Secondary | ICD-10-CM | POA: Diagnosis not present

## 2017-06-05 DIAGNOSIS — R9431 Abnormal electrocardiogram [ECG] [EKG]: Secondary | ICD-10-CM | POA: Diagnosis not present

## 2017-06-05 DIAGNOSIS — E871 Hypo-osmolality and hyponatremia: Secondary | ICD-10-CM | POA: Diagnosis not present

## 2017-06-05 DIAGNOSIS — X749XXS Intentional self-harm by unspecified firearm discharge, sequela: Secondary | ICD-10-CM | POA: Diagnosis not present

## 2017-06-05 DIAGNOSIS — N2581 Secondary hyperparathyroidism of renal origin: Secondary | ICD-10-CM | POA: Diagnosis not present

## 2017-06-05 DIAGNOSIS — E861 Hypovolemia: Secondary | ICD-10-CM

## 2017-06-05 DIAGNOSIS — C259 Malignant neoplasm of pancreas, unspecified: Secondary | ICD-10-CM | POA: Diagnosis not present

## 2017-06-05 DIAGNOSIS — E039 Hypothyroidism, unspecified: Secondary | ICD-10-CM | POA: Diagnosis present

## 2017-06-05 DIAGNOSIS — R4781 Slurred speech: Secondary | ICD-10-CM | POA: Diagnosis not present

## 2017-06-05 DIAGNOSIS — I9589 Other hypotension: Secondary | ICD-10-CM

## 2017-06-05 DIAGNOSIS — F319 Bipolar disorder, unspecified: Secondary | ICD-10-CM | POA: Diagnosis not present

## 2017-06-05 DIAGNOSIS — A4151 Sepsis due to Escherichia coli [E. coli]: Secondary | ICD-10-CM | POA: Diagnosis not present

## 2017-06-05 DIAGNOSIS — Z79899 Other long term (current) drug therapy: Secondary | ICD-10-CM | POA: Diagnosis not present

## 2017-06-05 DIAGNOSIS — E785 Hyperlipidemia, unspecified: Secondary | ICD-10-CM | POA: Diagnosis present

## 2017-06-05 DIAGNOSIS — R1314 Dysphagia, pharyngoesophageal phase: Secondary | ICD-10-CM | POA: Diagnosis not present

## 2017-06-05 DIAGNOSIS — A419 Sepsis, unspecified organism: Secondary | ICD-10-CM | POA: Diagnosis present

## 2017-06-05 DIAGNOSIS — Z992 Dependence on renal dialysis: Secondary | ICD-10-CM

## 2017-06-05 DIAGNOSIS — N12 Tubulo-interstitial nephritis, not specified as acute or chronic: Secondary | ICD-10-CM | POA: Diagnosis not present

## 2017-06-05 DIAGNOSIS — I959 Hypotension, unspecified: Secondary | ICD-10-CM | POA: Diagnosis present

## 2017-06-05 DIAGNOSIS — M7989 Other specified soft tissue disorders: Secondary | ICD-10-CM | POA: Diagnosis not present

## 2017-06-05 DIAGNOSIS — E440 Moderate protein-calorie malnutrition: Secondary | ICD-10-CM | POA: Diagnosis not present

## 2017-06-05 DIAGNOSIS — N390 Urinary tract infection, site not specified: Secondary | ICD-10-CM | POA: Diagnosis not present

## 2017-06-05 DIAGNOSIS — T43505S Adverse effect of unspecified antipsychotics and neuroleptics, sequela: Secondary | ICD-10-CM | POA: Diagnosis not present

## 2017-06-05 DIAGNOSIS — D631 Anemia in chronic kidney disease: Secondary | ICD-10-CM | POA: Diagnosis not present

## 2017-06-05 DIAGNOSIS — R918 Other nonspecific abnormal finding of lung field: Secondary | ICD-10-CM | POA: Diagnosis not present

## 2017-06-05 DIAGNOSIS — N186 End stage renal disease: Secondary | ICD-10-CM | POA: Diagnosis not present

## 2017-06-05 DIAGNOSIS — R531 Weakness: Secondary | ICD-10-CM | POA: Diagnosis not present

## 2017-06-05 DIAGNOSIS — R5383 Other fatigue: Secondary | ICD-10-CM | POA: Diagnosis not present

## 2017-06-05 DIAGNOSIS — B9620 Unspecified Escherichia coli [E. coli] as the cause of diseases classified elsewhere: Secondary | ICD-10-CM | POA: Diagnosis present

## 2017-06-05 DIAGNOSIS — E876 Hypokalemia: Secondary | ICD-10-CM | POA: Diagnosis present

## 2017-06-05 DIAGNOSIS — S069X0S Unspecified intracranial injury without loss of consciousness, sequela: Secondary | ICD-10-CM | POA: Diagnosis not present

## 2017-06-05 DIAGNOSIS — R600 Localized edema: Secondary | ICD-10-CM | POA: Diagnosis not present

## 2017-06-05 DIAGNOSIS — G2401 Drug induced subacute dyskinesia: Secondary | ICD-10-CM | POA: Diagnosis not present

## 2017-06-05 DIAGNOSIS — I120 Hypertensive chronic kidney disease with stage 5 chronic kidney disease or end stage renal disease: Secondary | ICD-10-CM | POA: Diagnosis not present

## 2017-06-05 DIAGNOSIS — R0989 Other specified symptoms and signs involving the circulatory and respiratory systems: Secondary | ICD-10-CM | POA: Diagnosis not present

## 2017-06-05 NOTE — Telephone Encounter (Signed)
PT called to inform pt is very weak and bp is low  80/50 appt scheduled for evaluation Pt's son notified

## 2017-06-07 ENCOUNTER — Ambulatory Visit (INDEPENDENT_AMBULATORY_CARE_PROVIDER_SITE_OTHER): Payer: Medicare Other

## 2017-06-07 DIAGNOSIS — E559 Vitamin D deficiency, unspecified: Secondary | ICD-10-CM

## 2017-06-07 DIAGNOSIS — D631 Anemia in chronic kidney disease: Secondary | ICD-10-CM | POA: Diagnosis not present

## 2017-06-07 DIAGNOSIS — X749XXS Intentional self-harm by unspecified firearm discharge, sequela: Secondary | ICD-10-CM

## 2017-06-07 DIAGNOSIS — G2401 Drug induced subacute dyskinesia: Secondary | ICD-10-CM | POA: Diagnosis not present

## 2017-06-07 DIAGNOSIS — E039 Hypothyroidism, unspecified: Secondary | ICD-10-CM

## 2017-06-07 DIAGNOSIS — D509 Iron deficiency anemia, unspecified: Secondary | ICD-10-CM | POA: Diagnosis not present

## 2017-06-07 DIAGNOSIS — S069X0S Unspecified intracranial injury without loss of consciousness, sequela: Secondary | ICD-10-CM

## 2017-06-07 DIAGNOSIS — R600 Localized edema: Secondary | ICD-10-CM | POA: Diagnosis not present

## 2017-06-07 DIAGNOSIS — T43505S Adverse effect of unspecified antipsychotics and neuroleptics, sequela: Secondary | ICD-10-CM

## 2017-06-07 DIAGNOSIS — I120 Hypertensive chronic kidney disease with stage 5 chronic kidney disease or end stage renal disease: Secondary | ICD-10-CM | POA: Diagnosis not present

## 2017-06-07 DIAGNOSIS — N186 End stage renal disease: Secondary | ICD-10-CM | POA: Diagnosis not present

## 2017-06-07 DIAGNOSIS — E871 Hypo-osmolality and hyponatremia: Secondary | ICD-10-CM

## 2017-06-07 DIAGNOSIS — R4781 Slurred speech: Secondary | ICD-10-CM | POA: Diagnosis not present

## 2017-06-07 DIAGNOSIS — F319 Bipolar disorder, unspecified: Secondary | ICD-10-CM

## 2017-06-07 DIAGNOSIS — K900 Celiac disease: Secondary | ICD-10-CM

## 2017-06-07 DIAGNOSIS — R1314 Dysphagia, pharyngoesophageal phase: Secondary | ICD-10-CM

## 2017-06-07 DIAGNOSIS — M79672 Pain in left foot: Secondary | ICD-10-CM

## 2017-06-07 DIAGNOSIS — Z79899 Other long term (current) drug therapy: Secondary | ICD-10-CM | POA: Diagnosis not present

## 2017-06-07 DIAGNOSIS — H9190 Unspecified hearing loss, unspecified ear: Secondary | ICD-10-CM

## 2017-06-07 DIAGNOSIS — N2581 Secondary hyperparathyroidism of renal origin: Secondary | ICD-10-CM | POA: Diagnosis not present

## 2017-06-07 DIAGNOSIS — E440 Moderate protein-calorie malnutrition: Secondary | ICD-10-CM | POA: Diagnosis not present

## 2017-06-07 MED ORDER — GENERIC EXTERNAL MEDICATION
1.00 | Status: DC
Start: 2017-06-09 — End: 2017-06-07

## 2017-06-07 MED ORDER — GENERIC EXTERNAL MEDICATION
3.38 g | Status: DC
Start: 2017-06-07 — End: 2017-06-07

## 2017-06-07 MED ORDER — DIVALPROEX SODIUM 500 MG PO DR TAB
500.00 | DELAYED_RELEASE_TABLET | ORAL | Status: DC
Start: 2017-06-08 — End: 2017-06-07

## 2017-06-07 MED ORDER — LEVOTHYROXINE SODIUM 50 MCG PO TABS
50.00 | ORAL_TABLET | ORAL | Status: DC
Start: 2017-06-09 — End: 2017-06-07

## 2017-06-07 MED ORDER — GENTAMICIN SULFATE 0.1 % EX CREA
1.00 | TOPICAL_CREAM | CUTANEOUS | Status: DC
Start: ? — End: 2017-06-07

## 2017-06-07 MED ORDER — HEPARIN SODIUM (PORCINE) 5000 UNIT/ML IJ SOLN
5000.00 | INTRAMUSCULAR | Status: DC
Start: 2017-06-08 — End: 2017-06-07

## 2017-06-07 MED ORDER — RISPERIDONE 1 MG PO TABS
1.00 | ORAL_TABLET | ORAL | Status: DC
Start: 2017-06-08 — End: 2017-06-07

## 2017-06-07 MED ORDER — CALCITRIOL 0.25 MCG PO CAPS
0.50 | ORAL_CAPSULE | ORAL | Status: DC
Start: 2017-06-09 — End: 2017-06-07

## 2017-06-08 ENCOUNTER — Encounter: Payer: Self-pay | Admitting: Family Medicine

## 2017-06-08 DIAGNOSIS — I120 Hypertensive chronic kidney disease with stage 5 chronic kidney disease or end stage renal disease: Secondary | ICD-10-CM | POA: Diagnosis not present

## 2017-06-08 DIAGNOSIS — D509 Iron deficiency anemia, unspecified: Secondary | ICD-10-CM | POA: Diagnosis not present

## 2017-06-08 DIAGNOSIS — E871 Hypo-osmolality and hyponatremia: Secondary | ICD-10-CM | POA: Diagnosis not present

## 2017-06-08 DIAGNOSIS — E440 Moderate protein-calorie malnutrition: Secondary | ICD-10-CM | POA: Diagnosis not present

## 2017-06-08 DIAGNOSIS — N2581 Secondary hyperparathyroidism of renal origin: Secondary | ICD-10-CM | POA: Diagnosis not present

## 2017-06-08 DIAGNOSIS — N186 End stage renal disease: Secondary | ICD-10-CM | POA: Diagnosis not present

## 2017-06-08 DIAGNOSIS — D631 Anemia in chronic kidney disease: Secondary | ICD-10-CM | POA: Diagnosis not present

## 2017-06-08 DIAGNOSIS — Z79899 Other long term (current) drug therapy: Secondary | ICD-10-CM | POA: Diagnosis not present
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
## 2017-06-09 DIAGNOSIS — E440 Moderate protein-calorie malnutrition: Secondary | ICD-10-CM | POA: Diagnosis not present

## 2017-06-09 DIAGNOSIS — D631 Anemia in chronic kidney disease: Secondary | ICD-10-CM | POA: Diagnosis not present

## 2017-06-09 DIAGNOSIS — Z79899 Other long term (current) drug therapy: Secondary | ICD-10-CM | POA: Diagnosis not present

## 2017-06-09 DIAGNOSIS — D509 Iron deficiency anemia, unspecified: Secondary | ICD-10-CM | POA: Diagnosis not present

## 2017-06-09 DIAGNOSIS — N2581 Secondary hyperparathyroidism of renal origin: Secondary | ICD-10-CM | POA: Diagnosis not present

## 2017-06-09 DIAGNOSIS — N186 End stage renal disease: Secondary | ICD-10-CM | POA: Diagnosis not present

## 2017-06-10 DIAGNOSIS — I120 Hypertensive chronic kidney disease with stage 5 chronic kidney disease or end stage renal disease: Secondary | ICD-10-CM | POA: Diagnosis not present

## 2017-06-10 DIAGNOSIS — E440 Moderate protein-calorie malnutrition: Secondary | ICD-10-CM | POA: Diagnosis not present

## 2017-06-10 DIAGNOSIS — D631 Anemia in chronic kidney disease: Secondary | ICD-10-CM | POA: Diagnosis not present

## 2017-06-10 DIAGNOSIS — Z79899 Other long term (current) drug therapy: Secondary | ICD-10-CM | POA: Diagnosis not present

## 2017-06-10 DIAGNOSIS — D509 Iron deficiency anemia, unspecified: Secondary | ICD-10-CM | POA: Diagnosis not present

## 2017-06-10 DIAGNOSIS — E871 Hypo-osmolality and hyponatremia: Secondary | ICD-10-CM | POA: Diagnosis not present

## 2017-06-10 DIAGNOSIS — N186 End stage renal disease: Secondary | ICD-10-CM | POA: Diagnosis not present

## 2017-06-10 DIAGNOSIS — N2581 Secondary hyperparathyroidism of renal origin: Secondary | ICD-10-CM | POA: Diagnosis not present

## 2017-06-10 MED ORDER — GENERIC EXTERNAL MEDICATION
500.00 | Status: DC
Start: 2017-06-09 — End: 2017-06-10

## 2017-06-11 DIAGNOSIS — D631 Anemia in chronic kidney disease: Secondary | ICD-10-CM | POA: Diagnosis not present

## 2017-06-11 DIAGNOSIS — E871 Hypo-osmolality and hyponatremia: Secondary | ICD-10-CM | POA: Diagnosis not present

## 2017-06-11 DIAGNOSIS — N186 End stage renal disease: Secondary | ICD-10-CM | POA: Diagnosis not present

## 2017-06-11 DIAGNOSIS — E440 Moderate protein-calorie malnutrition: Secondary | ICD-10-CM | POA: Diagnosis not present

## 2017-06-11 DIAGNOSIS — N2581 Secondary hyperparathyroidism of renal origin: Secondary | ICD-10-CM | POA: Diagnosis not present

## 2017-06-11 DIAGNOSIS — I120 Hypertensive chronic kidney disease with stage 5 chronic kidney disease or end stage renal disease: Secondary | ICD-10-CM | POA: Diagnosis not present

## 2017-06-11 DIAGNOSIS — Z79899 Other long term (current) drug therapy: Secondary | ICD-10-CM | POA: Diagnosis not present

## 2017-06-11 DIAGNOSIS — D509 Iron deficiency anemia, unspecified: Secondary | ICD-10-CM | POA: Diagnosis not present

## 2017-06-12 ENCOUNTER — Encounter: Payer: Self-pay | Admitting: Family

## 2017-06-12 DIAGNOSIS — I120 Hypertensive chronic kidney disease with stage 5 chronic kidney disease or end stage renal disease: Secondary | ICD-10-CM | POA: Diagnosis not present

## 2017-06-12 DIAGNOSIS — D509 Iron deficiency anemia, unspecified: Secondary | ICD-10-CM | POA: Diagnosis not present

## 2017-06-12 DIAGNOSIS — E440 Moderate protein-calorie malnutrition: Secondary | ICD-10-CM | POA: Diagnosis not present

## 2017-06-12 DIAGNOSIS — E871 Hypo-osmolality and hyponatremia: Secondary | ICD-10-CM | POA: Diagnosis not present

## 2017-06-12 DIAGNOSIS — N186 End stage renal disease: Secondary | ICD-10-CM | POA: Diagnosis not present

## 2017-06-12 DIAGNOSIS — D631 Anemia in chronic kidney disease: Secondary | ICD-10-CM | POA: Diagnosis not present

## 2017-06-12 DIAGNOSIS — N2581 Secondary hyperparathyroidism of renal origin: Secondary | ICD-10-CM | POA: Diagnosis not present

## 2017-06-12 DIAGNOSIS — Z79899 Other long term (current) drug therapy: Secondary | ICD-10-CM | POA: Diagnosis not present

## 2017-06-13 DIAGNOSIS — Z79899 Other long term (current) drug therapy: Secondary | ICD-10-CM | POA: Diagnosis not present

## 2017-06-13 DIAGNOSIS — N186 End stage renal disease: Secondary | ICD-10-CM | POA: Diagnosis not present

## 2017-06-13 DIAGNOSIS — D509 Iron deficiency anemia, unspecified: Secondary | ICD-10-CM | POA: Diagnosis not present

## 2017-06-13 DIAGNOSIS — E440 Moderate protein-calorie malnutrition: Secondary | ICD-10-CM | POA: Diagnosis not present

## 2017-06-13 DIAGNOSIS — N2581 Secondary hyperparathyroidism of renal origin: Secondary | ICD-10-CM | POA: Diagnosis not present

## 2017-06-13 DIAGNOSIS — D631 Anemia in chronic kidney disease: Secondary | ICD-10-CM | POA: Diagnosis not present

## 2017-06-14 DIAGNOSIS — N186 End stage renal disease: Secondary | ICD-10-CM | POA: Diagnosis not present

## 2017-06-14 DIAGNOSIS — E440 Moderate protein-calorie malnutrition: Secondary | ICD-10-CM | POA: Diagnosis not present

## 2017-06-14 DIAGNOSIS — D631 Anemia in chronic kidney disease: Secondary | ICD-10-CM | POA: Diagnosis not present

## 2017-06-14 DIAGNOSIS — T8241XD Breakdown (mechanical) of vascular dialysis catheter, subsequent encounter: Secondary | ICD-10-CM | POA: Diagnosis not present

## 2017-06-14 DIAGNOSIS — D509 Iron deficiency anemia, unspecified: Secondary | ICD-10-CM | POA: Diagnosis not present

## 2017-06-14 DIAGNOSIS — N2581 Secondary hyperparathyroidism of renal origin: Secondary | ICD-10-CM | POA: Diagnosis not present

## 2017-06-14 DIAGNOSIS — Z79899 Other long term (current) drug therapy: Secondary | ICD-10-CM | POA: Diagnosis not present

## 2017-06-14 DIAGNOSIS — Z452 Encounter for adjustment and management of vascular access device: Secondary | ICD-10-CM | POA: Diagnosis not present

## 2017-06-15 DIAGNOSIS — D631 Anemia in chronic kidney disease: Secondary | ICD-10-CM | POA: Diagnosis not present

## 2017-06-15 DIAGNOSIS — E871 Hypo-osmolality and hyponatremia: Secondary | ICD-10-CM | POA: Diagnosis not present

## 2017-06-15 DIAGNOSIS — E440 Moderate protein-calorie malnutrition: Secondary | ICD-10-CM | POA: Diagnosis not present

## 2017-06-15 DIAGNOSIS — I120 Hypertensive chronic kidney disease with stage 5 chronic kidney disease or end stage renal disease: Secondary | ICD-10-CM | POA: Diagnosis not present

## 2017-06-15 DIAGNOSIS — N186 End stage renal disease: Secondary | ICD-10-CM | POA: Diagnosis not present

## 2017-06-15 DIAGNOSIS — Z79899 Other long term (current) drug therapy: Secondary | ICD-10-CM | POA: Diagnosis not present

## 2017-06-15 DIAGNOSIS — N2581 Secondary hyperparathyroidism of renal origin: Secondary | ICD-10-CM | POA: Diagnosis not present

## 2017-06-15 DIAGNOSIS — D509 Iron deficiency anemia, unspecified: Secondary | ICD-10-CM | POA: Diagnosis not present

## 2017-06-16 DIAGNOSIS — D509 Iron deficiency anemia, unspecified: Secondary | ICD-10-CM | POA: Diagnosis not present

## 2017-06-16 DIAGNOSIS — E440 Moderate protein-calorie malnutrition: Secondary | ICD-10-CM | POA: Diagnosis not present

## 2017-06-16 DIAGNOSIS — N2581 Secondary hyperparathyroidism of renal origin: Secondary | ICD-10-CM | POA: Diagnosis not present

## 2017-06-16 DIAGNOSIS — N186 End stage renal disease: Secondary | ICD-10-CM | POA: Diagnosis not present

## 2017-06-16 DIAGNOSIS — D631 Anemia in chronic kidney disease: Secondary | ICD-10-CM | POA: Diagnosis not present

## 2017-06-16 DIAGNOSIS — Z79899 Other long term (current) drug therapy: Secondary | ICD-10-CM | POA: Diagnosis not present

## 2017-06-17 ENCOUNTER — Ambulatory Visit: Payer: Medicare Other | Admitting: Family

## 2017-06-17 DIAGNOSIS — Z79899 Other long term (current) drug therapy: Secondary | ICD-10-CM | POA: Diagnosis not present

## 2017-06-17 DIAGNOSIS — N186 End stage renal disease: Secondary | ICD-10-CM | POA: Diagnosis not present

## 2017-06-17 DIAGNOSIS — E440 Moderate protein-calorie malnutrition: Secondary | ICD-10-CM | POA: Diagnosis not present

## 2017-06-17 DIAGNOSIS — I120 Hypertensive chronic kidney disease with stage 5 chronic kidney disease or end stage renal disease: Secondary | ICD-10-CM | POA: Diagnosis not present

## 2017-06-17 DIAGNOSIS — E871 Hypo-osmolality and hyponatremia: Secondary | ICD-10-CM | POA: Diagnosis not present

## 2017-06-17 DIAGNOSIS — D509 Iron deficiency anemia, unspecified: Secondary | ICD-10-CM | POA: Diagnosis not present

## 2017-06-17 DIAGNOSIS — N2581 Secondary hyperparathyroidism of renal origin: Secondary | ICD-10-CM | POA: Diagnosis not present

## 2017-06-17 DIAGNOSIS — D631 Anemia in chronic kidney disease: Secondary | ICD-10-CM | POA: Diagnosis not present

## 2017-06-18 DIAGNOSIS — D631 Anemia in chronic kidney disease: Secondary | ICD-10-CM | POA: Diagnosis not present

## 2017-06-18 DIAGNOSIS — N186 End stage renal disease: Secondary | ICD-10-CM | POA: Diagnosis not present

## 2017-06-18 DIAGNOSIS — E440 Moderate protein-calorie malnutrition: Secondary | ICD-10-CM | POA: Diagnosis not present

## 2017-06-18 DIAGNOSIS — Z79899 Other long term (current) drug therapy: Secondary | ICD-10-CM | POA: Diagnosis not present

## 2017-06-18 DIAGNOSIS — D509 Iron deficiency anemia, unspecified: Secondary | ICD-10-CM | POA: Diagnosis not present

## 2017-06-18 DIAGNOSIS — E871 Hypo-osmolality and hyponatremia: Secondary | ICD-10-CM | POA: Diagnosis not present

## 2017-06-18 DIAGNOSIS — I120 Hypertensive chronic kidney disease with stage 5 chronic kidney disease or end stage renal disease: Secondary | ICD-10-CM | POA: Diagnosis not present

## 2017-06-18 DIAGNOSIS — N2581 Secondary hyperparathyroidism of renal origin: Secondary | ICD-10-CM | POA: Diagnosis not present

## 2017-06-19 DIAGNOSIS — E440 Moderate protein-calorie malnutrition: Secondary | ICD-10-CM | POA: Diagnosis not present

## 2017-06-19 DIAGNOSIS — D509 Iron deficiency anemia, unspecified: Secondary | ICD-10-CM | POA: Diagnosis not present

## 2017-06-19 DIAGNOSIS — I120 Hypertensive chronic kidney disease with stage 5 chronic kidney disease or end stage renal disease: Secondary | ICD-10-CM | POA: Diagnosis not present

## 2017-06-19 DIAGNOSIS — D631 Anemia in chronic kidney disease: Secondary | ICD-10-CM | POA: Diagnosis not present

## 2017-06-19 DIAGNOSIS — N2581 Secondary hyperparathyroidism of renal origin: Secondary | ICD-10-CM | POA: Diagnosis not present

## 2017-06-19 DIAGNOSIS — Z79899 Other long term (current) drug therapy: Secondary | ICD-10-CM | POA: Diagnosis not present

## 2017-06-19 DIAGNOSIS — N186 End stage renal disease: Secondary | ICD-10-CM | POA: Diagnosis not present

## 2017-06-19 DIAGNOSIS — E871 Hypo-osmolality and hyponatremia: Secondary | ICD-10-CM | POA: Diagnosis not present

## 2017-06-20 DIAGNOSIS — E440 Moderate protein-calorie malnutrition: Secondary | ICD-10-CM | POA: Diagnosis not present

## 2017-06-20 DIAGNOSIS — N2581 Secondary hyperparathyroidism of renal origin: Secondary | ICD-10-CM | POA: Diagnosis not present

## 2017-06-20 DIAGNOSIS — D631 Anemia in chronic kidney disease: Secondary | ICD-10-CM | POA: Diagnosis not present

## 2017-06-20 DIAGNOSIS — Z79899 Other long term (current) drug therapy: Secondary | ICD-10-CM | POA: Diagnosis not present

## 2017-06-20 DIAGNOSIS — D509 Iron deficiency anemia, unspecified: Secondary | ICD-10-CM | POA: Diagnosis not present

## 2017-06-20 DIAGNOSIS — N186 End stage renal disease: Secondary | ICD-10-CM | POA: Diagnosis not present

## 2017-06-21 DIAGNOSIS — E440 Moderate protein-calorie malnutrition: Secondary | ICD-10-CM | POA: Diagnosis not present

## 2017-06-21 DIAGNOSIS — Z79899 Other long term (current) drug therapy: Secondary | ICD-10-CM | POA: Diagnosis not present

## 2017-06-21 DIAGNOSIS — D509 Iron deficiency anemia, unspecified: Secondary | ICD-10-CM | POA: Diagnosis not present

## 2017-06-21 DIAGNOSIS — I120 Hypertensive chronic kidney disease with stage 5 chronic kidney disease or end stage renal disease: Secondary | ICD-10-CM | POA: Diagnosis not present

## 2017-06-21 DIAGNOSIS — E871 Hypo-osmolality and hyponatremia: Secondary | ICD-10-CM | POA: Diagnosis not present

## 2017-06-21 DIAGNOSIS — N2581 Secondary hyperparathyroidism of renal origin: Secondary | ICD-10-CM | POA: Diagnosis not present

## 2017-06-21 DIAGNOSIS — N186 End stage renal disease: Secondary | ICD-10-CM | POA: Diagnosis not present

## 2017-06-21 DIAGNOSIS — D631 Anemia in chronic kidney disease: Secondary | ICD-10-CM | POA: Diagnosis not present

## 2017-06-22 DIAGNOSIS — E440 Moderate protein-calorie malnutrition: Secondary | ICD-10-CM | POA: Diagnosis not present

## 2017-06-22 DIAGNOSIS — D509 Iron deficiency anemia, unspecified: Secondary | ICD-10-CM | POA: Diagnosis not present

## 2017-06-22 DIAGNOSIS — Z79899 Other long term (current) drug therapy: Secondary | ICD-10-CM | POA: Diagnosis not present

## 2017-06-22 DIAGNOSIS — N186 End stage renal disease: Secondary | ICD-10-CM | POA: Diagnosis not present

## 2017-06-22 DIAGNOSIS — D631 Anemia in chronic kidney disease: Secondary | ICD-10-CM | POA: Diagnosis not present

## 2017-06-22 DIAGNOSIS — N2581 Secondary hyperparathyroidism of renal origin: Secondary | ICD-10-CM | POA: Diagnosis not present

## 2017-06-23 DIAGNOSIS — N2581 Secondary hyperparathyroidism of renal origin: Secondary | ICD-10-CM | POA: Diagnosis not present

## 2017-06-23 DIAGNOSIS — E440 Moderate protein-calorie malnutrition: Secondary | ICD-10-CM | POA: Diagnosis not present

## 2017-06-23 DIAGNOSIS — D631 Anemia in chronic kidney disease: Secondary | ICD-10-CM | POA: Diagnosis not present

## 2017-06-23 DIAGNOSIS — N186 End stage renal disease: Secondary | ICD-10-CM | POA: Diagnosis not present

## 2017-06-23 DIAGNOSIS — D509 Iron deficiency anemia, unspecified: Secondary | ICD-10-CM | POA: Diagnosis not present

## 2017-06-23 DIAGNOSIS — Z79899 Other long term (current) drug therapy: Secondary | ICD-10-CM | POA: Diagnosis not present

## 2017-06-24 DIAGNOSIS — E871 Hypo-osmolality and hyponatremia: Secondary | ICD-10-CM | POA: Diagnosis not present

## 2017-06-24 DIAGNOSIS — D509 Iron deficiency anemia, unspecified: Secondary | ICD-10-CM | POA: Diagnosis not present

## 2017-06-24 DIAGNOSIS — E440 Moderate protein-calorie malnutrition: Secondary | ICD-10-CM | POA: Diagnosis not present

## 2017-06-24 DIAGNOSIS — Z79899 Other long term (current) drug therapy: Secondary | ICD-10-CM | POA: Diagnosis not present

## 2017-06-24 DIAGNOSIS — D631 Anemia in chronic kidney disease: Secondary | ICD-10-CM | POA: Diagnosis not present

## 2017-06-24 DIAGNOSIS — I120 Hypertensive chronic kidney disease with stage 5 chronic kidney disease or end stage renal disease: Secondary | ICD-10-CM | POA: Diagnosis not present

## 2017-06-24 DIAGNOSIS — N2581 Secondary hyperparathyroidism of renal origin: Secondary | ICD-10-CM | POA: Diagnosis not present

## 2017-06-24 DIAGNOSIS — N186 End stage renal disease: Secondary | ICD-10-CM | POA: Diagnosis not present

## 2017-06-25 DIAGNOSIS — N186 End stage renal disease: Secondary | ICD-10-CM | POA: Diagnosis not present

## 2017-06-25 DIAGNOSIS — D509 Iron deficiency anemia, unspecified: Secondary | ICD-10-CM | POA: Diagnosis not present

## 2017-06-25 DIAGNOSIS — D631 Anemia in chronic kidney disease: Secondary | ICD-10-CM | POA: Diagnosis not present

## 2017-06-25 DIAGNOSIS — Z79899 Other long term (current) drug therapy: Secondary | ICD-10-CM | POA: Diagnosis not present

## 2017-06-25 DIAGNOSIS — E440 Moderate protein-calorie malnutrition: Secondary | ICD-10-CM | POA: Diagnosis not present

## 2017-06-25 DIAGNOSIS — N2581 Secondary hyperparathyroidism of renal origin: Secondary | ICD-10-CM | POA: Diagnosis not present

## 2017-06-26 DIAGNOSIS — N186 End stage renal disease: Secondary | ICD-10-CM | POA: Diagnosis not present

## 2017-06-26 DIAGNOSIS — E440 Moderate protein-calorie malnutrition: Secondary | ICD-10-CM | POA: Diagnosis not present

## 2017-06-26 DIAGNOSIS — N2581 Secondary hyperparathyroidism of renal origin: Secondary | ICD-10-CM | POA: Diagnosis not present

## 2017-06-26 DIAGNOSIS — D509 Iron deficiency anemia, unspecified: Secondary | ICD-10-CM | POA: Diagnosis not present

## 2017-06-26 DIAGNOSIS — D631 Anemia in chronic kidney disease: Secondary | ICD-10-CM | POA: Diagnosis not present

## 2017-06-26 DIAGNOSIS — Z79899 Other long term (current) drug therapy: Secondary | ICD-10-CM | POA: Diagnosis not present

## 2017-06-27 DIAGNOSIS — D509 Iron deficiency anemia, unspecified: Secondary | ICD-10-CM | POA: Diagnosis not present

## 2017-06-27 DIAGNOSIS — D631 Anemia in chronic kidney disease: Secondary | ICD-10-CM | POA: Diagnosis not present

## 2017-06-27 DIAGNOSIS — E440 Moderate protein-calorie malnutrition: Secondary | ICD-10-CM | POA: Diagnosis not present

## 2017-06-27 DIAGNOSIS — N186 End stage renal disease: Secondary | ICD-10-CM | POA: Diagnosis not present

## 2017-06-27 DIAGNOSIS — I120 Hypertensive chronic kidney disease with stage 5 chronic kidney disease or end stage renal disease: Secondary | ICD-10-CM | POA: Diagnosis not present

## 2017-06-27 DIAGNOSIS — Z79899 Other long term (current) drug therapy: Secondary | ICD-10-CM | POA: Diagnosis not present

## 2017-06-27 DIAGNOSIS — E871 Hypo-osmolality and hyponatremia: Secondary | ICD-10-CM | POA: Diagnosis not present

## 2017-06-27 DIAGNOSIS — N2581 Secondary hyperparathyroidism of renal origin: Secondary | ICD-10-CM | POA: Diagnosis not present

## 2017-06-28 DIAGNOSIS — I120 Hypertensive chronic kidney disease with stage 5 chronic kidney disease or end stage renal disease: Secondary | ICD-10-CM | POA: Diagnosis not present

## 2017-06-28 DIAGNOSIS — D631 Anemia in chronic kidney disease: Secondary | ICD-10-CM | POA: Diagnosis not present

## 2017-06-28 DIAGNOSIS — D509 Iron deficiency anemia, unspecified: Secondary | ICD-10-CM | POA: Diagnosis not present

## 2017-06-28 DIAGNOSIS — Z79899 Other long term (current) drug therapy: Secondary | ICD-10-CM | POA: Diagnosis not present

## 2017-06-28 DIAGNOSIS — N186 End stage renal disease: Secondary | ICD-10-CM | POA: Diagnosis not present

## 2017-06-28 DIAGNOSIS — E871 Hypo-osmolality and hyponatremia: Secondary | ICD-10-CM | POA: Diagnosis not present

## 2017-06-28 DIAGNOSIS — N2581 Secondary hyperparathyroidism of renal origin: Secondary | ICD-10-CM | POA: Diagnosis not present

## 2017-06-28 DIAGNOSIS — E440 Moderate protein-calorie malnutrition: Secondary | ICD-10-CM | POA: Diagnosis not present

## 2017-06-29 DIAGNOSIS — E440 Moderate protein-calorie malnutrition: Secondary | ICD-10-CM | POA: Diagnosis not present

## 2017-06-29 DIAGNOSIS — N2581 Secondary hyperparathyroidism of renal origin: Secondary | ICD-10-CM | POA: Diagnosis not present

## 2017-06-29 DIAGNOSIS — I7789 Other specified disorders of arteries and arterioles: Secondary | ICD-10-CM | POA: Diagnosis not present

## 2017-06-29 DIAGNOSIS — N186 End stage renal disease: Secondary | ICD-10-CM | POA: Diagnosis not present

## 2017-06-29 DIAGNOSIS — Z79899 Other long term (current) drug therapy: Secondary | ICD-10-CM | POA: Diagnosis not present

## 2017-06-29 DIAGNOSIS — Z992 Dependence on renal dialysis: Secondary | ICD-10-CM | POA: Diagnosis not present

## 2017-06-29 DIAGNOSIS — D509 Iron deficiency anemia, unspecified: Secondary | ICD-10-CM | POA: Diagnosis not present

## 2017-06-29 DIAGNOSIS — D631 Anemia in chronic kidney disease: Secondary | ICD-10-CM | POA: Diagnosis not present

## 2017-06-29 DIAGNOSIS — R17 Unspecified jaundice: Secondary | ICD-10-CM | POA: Diagnosis not present

## 2017-06-30 DIAGNOSIS — D509 Iron deficiency anemia, unspecified: Secondary | ICD-10-CM | POA: Diagnosis not present

## 2017-06-30 DIAGNOSIS — N186 End stage renal disease: Secondary | ICD-10-CM | POA: Diagnosis not present

## 2017-06-30 DIAGNOSIS — Z79899 Other long term (current) drug therapy: Secondary | ICD-10-CM | POA: Diagnosis not present

## 2017-06-30 DIAGNOSIS — N2581 Secondary hyperparathyroidism of renal origin: Secondary | ICD-10-CM | POA: Diagnosis not present

## 2017-06-30 DIAGNOSIS — E440 Moderate protein-calorie malnutrition: Secondary | ICD-10-CM | POA: Diagnosis not present

## 2017-06-30 DIAGNOSIS — D631 Anemia in chronic kidney disease: Secondary | ICD-10-CM | POA: Diagnosis not present

## 2017-07-01 ENCOUNTER — Telehealth: Payer: Self-pay | Admitting: *Deleted

## 2017-07-01 DIAGNOSIS — N186 End stage renal disease: Secondary | ICD-10-CM | POA: Diagnosis not present

## 2017-07-01 DIAGNOSIS — Z79899 Other long term (current) drug therapy: Secondary | ICD-10-CM | POA: Diagnosis not present

## 2017-07-01 DIAGNOSIS — D631 Anemia in chronic kidney disease: Secondary | ICD-10-CM | POA: Diagnosis not present

## 2017-07-01 DIAGNOSIS — D509 Iron deficiency anemia, unspecified: Secondary | ICD-10-CM | POA: Diagnosis not present

## 2017-07-01 DIAGNOSIS — E871 Hypo-osmolality and hyponatremia: Secondary | ICD-10-CM | POA: Diagnosis not present

## 2017-07-01 DIAGNOSIS — I120 Hypertensive chronic kidney disease with stage 5 chronic kidney disease or end stage renal disease: Secondary | ICD-10-CM | POA: Diagnosis not present

## 2017-07-01 DIAGNOSIS — E440 Moderate protein-calorie malnutrition: Secondary | ICD-10-CM | POA: Diagnosis not present

## 2017-07-01 DIAGNOSIS — N2581 Secondary hyperparathyroidism of renal origin: Secondary | ICD-10-CM | POA: Diagnosis not present

## 2017-07-01 DIAGNOSIS — F319 Bipolar disorder, unspecified: Secondary | ICD-10-CM | POA: Diagnosis not present
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
## 2017-07-02 DIAGNOSIS — N2581 Secondary hyperparathyroidism of renal origin: Secondary | ICD-10-CM | POA: Diagnosis not present

## 2017-07-02 DIAGNOSIS — Z79899 Other long term (current) drug therapy: Secondary | ICD-10-CM | POA: Diagnosis not present

## 2017-07-02 DIAGNOSIS — D509 Iron deficiency anemia, unspecified: Secondary | ICD-10-CM | POA: Diagnosis not present

## 2017-07-02 DIAGNOSIS — D631 Anemia in chronic kidney disease: Secondary | ICD-10-CM | POA: Diagnosis not present

## 2017-07-02 DIAGNOSIS — N186 End stage renal disease: Secondary | ICD-10-CM | POA: Diagnosis not present

## 2017-07-02 DIAGNOSIS — E440 Moderate protein-calorie malnutrition: Secondary | ICD-10-CM | POA: Diagnosis not present

## 2017-07-03 DIAGNOSIS — D509 Iron deficiency anemia, unspecified: Secondary | ICD-10-CM | POA: Diagnosis not present

## 2017-07-03 DIAGNOSIS — N186 End stage renal disease: Secondary | ICD-10-CM | POA: Diagnosis not present

## 2017-07-03 DIAGNOSIS — E7849 Other hyperlipidemia: Secondary | ICD-10-CM | POA: Diagnosis not present

## 2017-07-03 DIAGNOSIS — R82998 Other abnormal findings in urine: Secondary | ICD-10-CM | POA: Diagnosis not present

## 2017-07-03 DIAGNOSIS — D631 Anemia in chronic kidney disease: Secondary | ICD-10-CM | POA: Diagnosis not present

## 2017-07-03 DIAGNOSIS — E440 Moderate protein-calorie malnutrition: Secondary | ICD-10-CM | POA: Diagnosis not present

## 2017-07-03 DIAGNOSIS — Z79899 Other long term (current) drug therapy: Secondary | ICD-10-CM | POA: Diagnosis not present

## 2017-07-03 DIAGNOSIS — N2581 Secondary hyperparathyroidism of renal origin: Secondary | ICD-10-CM | POA: Diagnosis not present

## 2017-07-04 DIAGNOSIS — E440 Moderate protein-calorie malnutrition: Secondary | ICD-10-CM | POA: Diagnosis not present

## 2017-07-04 DIAGNOSIS — D631 Anemia in chronic kidney disease: Secondary | ICD-10-CM | POA: Diagnosis not present

## 2017-07-04 DIAGNOSIS — N186 End stage renal disease: Secondary | ICD-10-CM | POA: Diagnosis not present

## 2017-07-04 DIAGNOSIS — N2581 Secondary hyperparathyroidism of renal origin: Secondary | ICD-10-CM | POA: Diagnosis not present

## 2017-07-04 DIAGNOSIS — Z79899 Other long term (current) drug therapy: Secondary | ICD-10-CM | POA: Diagnosis not present

## 2017-07-04 DIAGNOSIS — D509 Iron deficiency anemia, unspecified: Secondary | ICD-10-CM | POA: Diagnosis not present

## 2017-07-04 DIAGNOSIS — Z992 Dependence on renal dialysis: Secondary | ICD-10-CM | POA: Diagnosis not present

## 2017-07-05 DIAGNOSIS — N186 End stage renal disease: Secondary | ICD-10-CM | POA: Diagnosis not present

## 2017-07-05 DIAGNOSIS — I120 Hypertensive chronic kidney disease with stage 5 chronic kidney disease or end stage renal disease: Secondary | ICD-10-CM | POA: Diagnosis not present

## 2017-07-05 DIAGNOSIS — D509 Iron deficiency anemia, unspecified: Secondary | ICD-10-CM | POA: Diagnosis not present

## 2017-07-05 DIAGNOSIS — D631 Anemia in chronic kidney disease: Secondary | ICD-10-CM | POA: Diagnosis not present

## 2017-07-05 DIAGNOSIS — Z79899 Other long term (current) drug therapy: Secondary | ICD-10-CM | POA: Diagnosis not present

## 2017-07-05 DIAGNOSIS — N2581 Secondary hyperparathyroidism of renal origin: Secondary | ICD-10-CM | POA: Diagnosis not present

## 2017-07-05 DIAGNOSIS — E440 Moderate protein-calorie malnutrition: Secondary | ICD-10-CM | POA: Diagnosis not present

## 2017-07-05 DIAGNOSIS — E871 Hypo-osmolality and hyponatremia: Secondary | ICD-10-CM | POA: Diagnosis not present

## 2017-07-06 DIAGNOSIS — N2581 Secondary hyperparathyroidism of renal origin: Secondary | ICD-10-CM | POA: Diagnosis not present

## 2017-07-06 DIAGNOSIS — D509 Iron deficiency anemia, unspecified: Secondary | ICD-10-CM | POA: Diagnosis not present

## 2017-07-06 DIAGNOSIS — D631 Anemia in chronic kidney disease: Secondary | ICD-10-CM | POA: Diagnosis not present

## 2017-07-06 DIAGNOSIS — Z79899 Other long term (current) drug therapy: Secondary | ICD-10-CM | POA: Diagnosis not present

## 2017-07-06 DIAGNOSIS — E440 Moderate protein-calorie malnutrition: Secondary | ICD-10-CM | POA: Diagnosis not present

## 2017-07-06 DIAGNOSIS — N186 End stage renal disease: Secondary | ICD-10-CM | POA: Diagnosis not present

## 2017-07-07 DIAGNOSIS — Z79899 Other long term (current) drug therapy: Secondary | ICD-10-CM | POA: Diagnosis not present

## 2017-07-07 DIAGNOSIS — E440 Moderate protein-calorie malnutrition: Secondary | ICD-10-CM | POA: Diagnosis not present

## 2017-07-07 DIAGNOSIS — D631 Anemia in chronic kidney disease: Secondary | ICD-10-CM | POA: Diagnosis not present

## 2017-07-07 DIAGNOSIS — N186 End stage renal disease: Secondary | ICD-10-CM | POA: Diagnosis not present

## 2017-07-07 DIAGNOSIS — N2581 Secondary hyperparathyroidism of renal origin: Secondary | ICD-10-CM | POA: Diagnosis not present

## 2017-07-07 DIAGNOSIS — D509 Iron deficiency anemia, unspecified: Secondary | ICD-10-CM | POA: Diagnosis not present

## 2017-07-08 DIAGNOSIS — E440 Moderate protein-calorie malnutrition: Secondary | ICD-10-CM | POA: Diagnosis not present

## 2017-07-08 DIAGNOSIS — I120 Hypertensive chronic kidney disease with stage 5 chronic kidney disease or end stage renal disease: Secondary | ICD-10-CM | POA: Diagnosis not present

## 2017-07-08 DIAGNOSIS — D509 Iron deficiency anemia, unspecified: Secondary | ICD-10-CM | POA: Diagnosis not present

## 2017-07-08 DIAGNOSIS — N2581 Secondary hyperparathyroidism of renal origin: Secondary | ICD-10-CM | POA: Diagnosis not present

## 2017-07-08 DIAGNOSIS — E871 Hypo-osmolality and hyponatremia: Secondary | ICD-10-CM | POA: Diagnosis not present

## 2017-07-08 DIAGNOSIS — Z79899 Other long term (current) drug therapy: Secondary | ICD-10-CM | POA: Diagnosis not present

## 2017-07-08 DIAGNOSIS — D631 Anemia in chronic kidney disease: Secondary | ICD-10-CM | POA: Diagnosis not present

## 2017-07-08 DIAGNOSIS — N186 End stage renal disease: Secondary | ICD-10-CM | POA: Diagnosis not present

## 2017-07-09 DIAGNOSIS — N2581 Secondary hyperparathyroidism of renal origin: Secondary | ICD-10-CM | POA: Diagnosis not present

## 2017-07-09 DIAGNOSIS — D509 Iron deficiency anemia, unspecified: Secondary | ICD-10-CM | POA: Diagnosis not present

## 2017-07-09 DIAGNOSIS — N186 End stage renal disease: Secondary | ICD-10-CM | POA: Diagnosis not present

## 2017-07-09 DIAGNOSIS — E440 Moderate protein-calorie malnutrition: Secondary | ICD-10-CM | POA: Diagnosis not present

## 2017-07-09 DIAGNOSIS — Z79899 Other long term (current) drug therapy: Secondary | ICD-10-CM | POA: Diagnosis not present

## 2017-07-09 DIAGNOSIS — D631 Anemia in chronic kidney disease: Secondary | ICD-10-CM | POA: Diagnosis not present

## 2017-07-10 DIAGNOSIS — D509 Iron deficiency anemia, unspecified: Secondary | ICD-10-CM | POA: Diagnosis not present

## 2017-07-10 DIAGNOSIS — I120 Hypertensive chronic kidney disease with stage 5 chronic kidney disease or end stage renal disease: Secondary | ICD-10-CM | POA: Diagnosis not present

## 2017-07-10 DIAGNOSIS — Z79899 Other long term (current) drug therapy: Secondary | ICD-10-CM | POA: Diagnosis not present

## 2017-07-10 DIAGNOSIS — D631 Anemia in chronic kidney disease: Secondary | ICD-10-CM | POA: Diagnosis not present

## 2017-07-10 DIAGNOSIS — N186 End stage renal disease: Secondary | ICD-10-CM | POA: Diagnosis not present

## 2017-07-10 DIAGNOSIS — N2581 Secondary hyperparathyroidism of renal origin: Secondary | ICD-10-CM | POA: Diagnosis not present

## 2017-07-10 DIAGNOSIS — E871 Hypo-osmolality and hyponatremia: Secondary | ICD-10-CM | POA: Diagnosis not present

## 2017-07-10 DIAGNOSIS — E440 Moderate protein-calorie malnutrition: Secondary | ICD-10-CM | POA: Diagnosis not present

## 2017-07-11 DIAGNOSIS — Z79899 Other long term (current) drug therapy: Secondary | ICD-10-CM | POA: Diagnosis not present

## 2017-07-11 DIAGNOSIS — D509 Iron deficiency anemia, unspecified: Secondary | ICD-10-CM | POA: Diagnosis not present

## 2017-07-11 DIAGNOSIS — N186 End stage renal disease: Secondary | ICD-10-CM | POA: Diagnosis not present

## 2017-07-11 DIAGNOSIS — E440 Moderate protein-calorie malnutrition: Secondary | ICD-10-CM | POA: Diagnosis not present

## 2017-07-11 DIAGNOSIS — N2581 Secondary hyperparathyroidism of renal origin: Secondary | ICD-10-CM | POA: Diagnosis not present

## 2017-07-11 DIAGNOSIS — D631 Anemia in chronic kidney disease: Secondary | ICD-10-CM | POA: Diagnosis not present

## 2017-07-12 DIAGNOSIS — E440 Moderate protein-calorie malnutrition: Secondary | ICD-10-CM | POA: Diagnosis not present

## 2017-07-12 DIAGNOSIS — D509 Iron deficiency anemia, unspecified: Secondary | ICD-10-CM | POA: Diagnosis not present

## 2017-07-12 DIAGNOSIS — D631 Anemia in chronic kidney disease: Secondary | ICD-10-CM | POA: Diagnosis not present

## 2017-07-12 DIAGNOSIS — N2581 Secondary hyperparathyroidism of renal origin: Secondary | ICD-10-CM | POA: Diagnosis not present

## 2017-07-12 DIAGNOSIS — N186 End stage renal disease: Secondary | ICD-10-CM | POA: Diagnosis not present

## 2017-07-12 DIAGNOSIS — Z79899 Other long term (current) drug therapy: Secondary | ICD-10-CM | POA: Diagnosis not present

## 2017-07-13 DIAGNOSIS — Z79899 Other long term (current) drug therapy: Secondary | ICD-10-CM | POA: Diagnosis not present

## 2017-07-13 DIAGNOSIS — N186 End stage renal disease: Secondary | ICD-10-CM | POA: Diagnosis not present

## 2017-07-13 DIAGNOSIS — D509 Iron deficiency anemia, unspecified: Secondary | ICD-10-CM | POA: Diagnosis not present

## 2017-07-13 DIAGNOSIS — N2581 Secondary hyperparathyroidism of renal origin: Secondary | ICD-10-CM | POA: Diagnosis not present

## 2017-07-13 DIAGNOSIS — E440 Moderate protein-calorie malnutrition: Secondary | ICD-10-CM | POA: Diagnosis not present

## 2017-07-13 DIAGNOSIS — D631 Anemia in chronic kidney disease: Secondary | ICD-10-CM | POA: Diagnosis not present

## 2017-07-14 DIAGNOSIS — N186 End stage renal disease: Secondary | ICD-10-CM | POA: Diagnosis not present

## 2017-07-14 DIAGNOSIS — D509 Iron deficiency anemia, unspecified: Secondary | ICD-10-CM | POA: Diagnosis not present

## 2017-07-14 DIAGNOSIS — N2581 Secondary hyperparathyroidism of renal origin: Secondary | ICD-10-CM | POA: Diagnosis not present

## 2017-07-14 DIAGNOSIS — D631 Anemia in chronic kidney disease: Secondary | ICD-10-CM | POA: Diagnosis not present

## 2017-07-14 DIAGNOSIS — E440 Moderate protein-calorie malnutrition: Secondary | ICD-10-CM | POA: Diagnosis not present

## 2017-07-14 DIAGNOSIS — Z79899 Other long term (current) drug therapy: Secondary | ICD-10-CM | POA: Diagnosis not present

## 2017-07-15 DIAGNOSIS — D509 Iron deficiency anemia, unspecified: Secondary | ICD-10-CM | POA: Diagnosis not present

## 2017-07-15 DIAGNOSIS — Z79899 Other long term (current) drug therapy: Secondary | ICD-10-CM | POA: Diagnosis not present

## 2017-07-15 DIAGNOSIS — N186 End stage renal disease: Secondary | ICD-10-CM | POA: Diagnosis not present

## 2017-07-15 DIAGNOSIS — N2581 Secondary hyperparathyroidism of renal origin: Secondary | ICD-10-CM | POA: Diagnosis not present

## 2017-07-15 DIAGNOSIS — I120 Hypertensive chronic kidney disease with stage 5 chronic kidney disease or end stage renal disease: Secondary | ICD-10-CM | POA: Diagnosis not present

## 2017-07-15 DIAGNOSIS — E871 Hypo-osmolality and hyponatremia: Secondary | ICD-10-CM | POA: Diagnosis not present

## 2017-07-15 DIAGNOSIS — E440 Moderate protein-calorie malnutrition: Secondary | ICD-10-CM | POA: Diagnosis not present

## 2017-07-15 DIAGNOSIS — D631 Anemia in chronic kidney disease: Secondary | ICD-10-CM | POA: Diagnosis not present

## 2017-07-16 DIAGNOSIS — D509 Iron deficiency anemia, unspecified: Secondary | ICD-10-CM | POA: Diagnosis not present

## 2017-07-16 DIAGNOSIS — E440 Moderate protein-calorie malnutrition: Secondary | ICD-10-CM | POA: Diagnosis not present

## 2017-07-16 DIAGNOSIS — N2581 Secondary hyperparathyroidism of renal origin: Secondary | ICD-10-CM | POA: Diagnosis not present

## 2017-07-16 DIAGNOSIS — D631 Anemia in chronic kidney disease: Secondary | ICD-10-CM | POA: Diagnosis not present

## 2017-07-16 DIAGNOSIS — Z79899 Other long term (current) drug therapy: Secondary | ICD-10-CM | POA: Diagnosis not present

## 2017-07-16 DIAGNOSIS — N186 End stage renal disease: Secondary | ICD-10-CM | POA: Diagnosis not present

## 2017-07-17 DIAGNOSIS — D509 Iron deficiency anemia, unspecified: Secondary | ICD-10-CM | POA: Diagnosis not present

## 2017-07-17 DIAGNOSIS — N2581 Secondary hyperparathyroidism of renal origin: Secondary | ICD-10-CM | POA: Diagnosis not present

## 2017-07-17 DIAGNOSIS — D631 Anemia in chronic kidney disease: Secondary | ICD-10-CM | POA: Diagnosis not present

## 2017-07-17 DIAGNOSIS — E440 Moderate protein-calorie malnutrition: Secondary | ICD-10-CM | POA: Diagnosis not present

## 2017-07-17 DIAGNOSIS — Z79899 Other long term (current) drug therapy: Secondary | ICD-10-CM | POA: Diagnosis not present

## 2017-07-17 DIAGNOSIS — N186 End stage renal disease: Secondary | ICD-10-CM | POA: Diagnosis not present

## 2017-07-18 DIAGNOSIS — N186 End stage renal disease: Secondary | ICD-10-CM | POA: Diagnosis not present

## 2017-07-18 DIAGNOSIS — N2581 Secondary hyperparathyroidism of renal origin: Secondary | ICD-10-CM | POA: Diagnosis not present

## 2017-07-18 DIAGNOSIS — Z79899 Other long term (current) drug therapy: Secondary | ICD-10-CM | POA: Diagnosis not present

## 2017-07-18 DIAGNOSIS — D631 Anemia in chronic kidney disease: Secondary | ICD-10-CM | POA: Diagnosis not present

## 2017-07-18 DIAGNOSIS — D509 Iron deficiency anemia, unspecified: Secondary | ICD-10-CM | POA: Diagnosis not present

## 2017-07-18 DIAGNOSIS — E440 Moderate protein-calorie malnutrition: Secondary | ICD-10-CM | POA: Diagnosis not present

## 2017-07-19 DIAGNOSIS — I120 Hypertensive chronic kidney disease with stage 5 chronic kidney disease or end stage renal disease: Secondary | ICD-10-CM | POA: Diagnosis not present

## 2017-07-19 DIAGNOSIS — E871 Hypo-osmolality and hyponatremia: Secondary | ICD-10-CM | POA: Diagnosis not present

## 2017-07-19 DIAGNOSIS — N186 End stage renal disease: Secondary | ICD-10-CM | POA: Diagnosis not present

## 2017-07-19 DIAGNOSIS — D509 Iron deficiency anemia, unspecified: Secondary | ICD-10-CM | POA: Diagnosis not present

## 2017-07-19 DIAGNOSIS — D631 Anemia in chronic kidney disease: Secondary | ICD-10-CM | POA: Diagnosis not present

## 2017-07-19 DIAGNOSIS — E440 Moderate protein-calorie malnutrition: Secondary | ICD-10-CM | POA: Diagnosis not present

## 2017-07-19 DIAGNOSIS — N2581 Secondary hyperparathyroidism of renal origin: Secondary | ICD-10-CM | POA: Diagnosis not present

## 2017-07-19 DIAGNOSIS — Z79899 Other long term (current) drug therapy: Secondary | ICD-10-CM | POA: Diagnosis not present

## 2017-07-20 DIAGNOSIS — Z79899 Other long term (current) drug therapy: Secondary | ICD-10-CM | POA: Diagnosis not present

## 2017-07-20 DIAGNOSIS — D509 Iron deficiency anemia, unspecified: Secondary | ICD-10-CM | POA: Diagnosis not present

## 2017-07-20 DIAGNOSIS — E440 Moderate protein-calorie malnutrition: Secondary | ICD-10-CM | POA: Diagnosis not present

## 2017-07-20 DIAGNOSIS — D631 Anemia in chronic kidney disease: Secondary | ICD-10-CM | POA: Diagnosis not present

## 2017-07-20 DIAGNOSIS — N2581 Secondary hyperparathyroidism of renal origin: Secondary | ICD-10-CM | POA: Diagnosis not present

## 2017-07-20 DIAGNOSIS — N186 End stage renal disease: Secondary | ICD-10-CM | POA: Diagnosis not present

## 2017-07-21 DIAGNOSIS — N186 End stage renal disease: Secondary | ICD-10-CM | POA: Diagnosis not present

## 2017-07-21 DIAGNOSIS — D631 Anemia in chronic kidney disease: Secondary | ICD-10-CM | POA: Diagnosis not present

## 2017-07-21 DIAGNOSIS — E440 Moderate protein-calorie malnutrition: Secondary | ICD-10-CM | POA: Diagnosis not present

## 2017-07-21 DIAGNOSIS — Z79899 Other long term (current) drug therapy: Secondary | ICD-10-CM | POA: Diagnosis not present

## 2017-07-21 DIAGNOSIS — N2581 Secondary hyperparathyroidism of renal origin: Secondary | ICD-10-CM | POA: Diagnosis not present

## 2017-07-21 DIAGNOSIS — D509 Iron deficiency anemia, unspecified: Secondary | ICD-10-CM | POA: Diagnosis not present

## 2017-07-22 DIAGNOSIS — Z79899 Other long term (current) drug therapy: Secondary | ICD-10-CM | POA: Diagnosis not present

## 2017-07-22 DIAGNOSIS — D509 Iron deficiency anemia, unspecified: Secondary | ICD-10-CM | POA: Diagnosis not present

## 2017-07-22 DIAGNOSIS — N2581 Secondary hyperparathyroidism of renal origin: Secondary | ICD-10-CM | POA: Diagnosis not present

## 2017-07-22 DIAGNOSIS — D631 Anemia in chronic kidney disease: Secondary | ICD-10-CM | POA: Diagnosis not present

## 2017-07-22 DIAGNOSIS — E871 Hypo-osmolality and hyponatremia: Secondary | ICD-10-CM | POA: Diagnosis not present

## 2017-07-22 DIAGNOSIS — E440 Moderate protein-calorie malnutrition: Secondary | ICD-10-CM | POA: Diagnosis not present

## 2017-07-22 DIAGNOSIS — I120 Hypertensive chronic kidney disease with stage 5 chronic kidney disease or end stage renal disease: Secondary | ICD-10-CM | POA: Diagnosis not present

## 2017-07-22 DIAGNOSIS — N186 End stage renal disease: Secondary | ICD-10-CM | POA: Diagnosis not present

## 2017-07-23 DIAGNOSIS — N2581 Secondary hyperparathyroidism of renal origin: Secondary | ICD-10-CM | POA: Diagnosis not present

## 2017-07-23 DIAGNOSIS — D509 Iron deficiency anemia, unspecified: Secondary | ICD-10-CM | POA: Diagnosis not present

## 2017-07-23 DIAGNOSIS — Z79899 Other long term (current) drug therapy: Secondary | ICD-10-CM | POA: Diagnosis not present

## 2017-07-23 DIAGNOSIS — D631 Anemia in chronic kidney disease: Secondary | ICD-10-CM | POA: Diagnosis not present

## 2017-07-23 DIAGNOSIS — E440 Moderate protein-calorie malnutrition: Secondary | ICD-10-CM | POA: Diagnosis not present

## 2017-07-23 DIAGNOSIS — N186 End stage renal disease: Secondary | ICD-10-CM | POA: Diagnosis not present

## 2017-07-24 DIAGNOSIS — Z79899 Other long term (current) drug therapy: Secondary | ICD-10-CM | POA: Diagnosis not present

## 2017-07-24 DIAGNOSIS — N2581 Secondary hyperparathyroidism of renal origin: Secondary | ICD-10-CM | POA: Diagnosis not present

## 2017-07-24 DIAGNOSIS — E440 Moderate protein-calorie malnutrition: Secondary | ICD-10-CM | POA: Diagnosis not present

## 2017-07-24 DIAGNOSIS — N186 End stage renal disease: Secondary | ICD-10-CM | POA: Diagnosis not present

## 2017-07-24 DIAGNOSIS — D631 Anemia in chronic kidney disease: Secondary | ICD-10-CM | POA: Diagnosis not present

## 2017-07-24 DIAGNOSIS — D509 Iron deficiency anemia, unspecified: Secondary | ICD-10-CM | POA: Diagnosis not present

## 2017-07-25 DIAGNOSIS — E440 Moderate protein-calorie malnutrition: Secondary | ICD-10-CM | POA: Diagnosis not present

## 2017-07-25 DIAGNOSIS — Z79899 Other long term (current) drug therapy: Secondary | ICD-10-CM | POA: Diagnosis not present

## 2017-07-25 DIAGNOSIS — D509 Iron deficiency anemia, unspecified: Secondary | ICD-10-CM | POA: Diagnosis not present

## 2017-07-25 DIAGNOSIS — D631 Anemia in chronic kidney disease: Secondary | ICD-10-CM | POA: Diagnosis not present

## 2017-07-25 DIAGNOSIS — N2581 Secondary hyperparathyroidism of renal origin: Secondary | ICD-10-CM | POA: Diagnosis not present

## 2017-07-25 DIAGNOSIS — N186 End stage renal disease: Secondary | ICD-10-CM | POA: Diagnosis not present

## 2017-07-26 DIAGNOSIS — N186 End stage renal disease: Secondary | ICD-10-CM | POA: Diagnosis not present

## 2017-07-26 DIAGNOSIS — E440 Moderate protein-calorie malnutrition: Secondary | ICD-10-CM | POA: Diagnosis not present

## 2017-07-26 DIAGNOSIS — N2581 Secondary hyperparathyroidism of renal origin: Secondary | ICD-10-CM | POA: Diagnosis not present

## 2017-07-26 DIAGNOSIS — D509 Iron deficiency anemia, unspecified: Secondary | ICD-10-CM | POA: Diagnosis not present

## 2017-07-26 DIAGNOSIS — D631 Anemia in chronic kidney disease: Secondary | ICD-10-CM | POA: Diagnosis not present

## 2017-07-26 DIAGNOSIS — Z79899 Other long term (current) drug therapy: Secondary | ICD-10-CM | POA: Diagnosis not present

## 2017-07-27 DIAGNOSIS — D509 Iron deficiency anemia, unspecified: Secondary | ICD-10-CM | POA: Diagnosis not present

## 2017-07-27 DIAGNOSIS — Z79899 Other long term (current) drug therapy: Secondary | ICD-10-CM | POA: Diagnosis not present

## 2017-07-27 DIAGNOSIS — N2581 Secondary hyperparathyroidism of renal origin: Secondary | ICD-10-CM | POA: Diagnosis not present

## 2017-07-27 DIAGNOSIS — N186 End stage renal disease: Secondary | ICD-10-CM | POA: Diagnosis not present

## 2017-07-27 DIAGNOSIS — E440 Moderate protein-calorie malnutrition: Secondary | ICD-10-CM | POA: Diagnosis not present

## 2017-07-27 DIAGNOSIS — D631 Anemia in chronic kidney disease: Secondary | ICD-10-CM | POA: Diagnosis not present

## 2017-07-28 DIAGNOSIS — D631 Anemia in chronic kidney disease: Secondary | ICD-10-CM | POA: Diagnosis not present

## 2017-07-28 DIAGNOSIS — N2581 Secondary hyperparathyroidism of renal origin: Secondary | ICD-10-CM | POA: Diagnosis not present

## 2017-07-28 DIAGNOSIS — N186 End stage renal disease: Secondary | ICD-10-CM | POA: Diagnosis not present

## 2017-07-28 DIAGNOSIS — Z79899 Other long term (current) drug therapy: Secondary | ICD-10-CM | POA: Diagnosis not present

## 2017-07-28 DIAGNOSIS — E440 Moderate protein-calorie malnutrition: Secondary | ICD-10-CM | POA: Diagnosis not present

## 2017-07-28 DIAGNOSIS — D509 Iron deficiency anemia, unspecified: Secondary | ICD-10-CM | POA: Diagnosis not present

## 2017-07-29 DIAGNOSIS — N2581 Secondary hyperparathyroidism of renal origin: Secondary | ICD-10-CM | POA: Diagnosis not present

## 2017-07-29 DIAGNOSIS — N186 End stage renal disease: Secondary | ICD-10-CM | POA: Diagnosis not present

## 2017-07-29 DIAGNOSIS — N2589 Other disorders resulting from impaired renal tubular function: Secondary | ICD-10-CM | POA: Diagnosis not present

## 2017-07-29 DIAGNOSIS — D509 Iron deficiency anemia, unspecified: Secondary | ICD-10-CM | POA: Diagnosis not present

## 2017-07-29 DIAGNOSIS — D631 Anemia in chronic kidney disease: Secondary | ICD-10-CM | POA: Diagnosis not present

## 2017-07-29 DIAGNOSIS — I7789 Other specified disorders of arteries and arterioles: Secondary | ICD-10-CM | POA: Diagnosis not present

## 2017-07-29 DIAGNOSIS — E440 Moderate protein-calorie malnutrition: Secondary | ICD-10-CM | POA: Diagnosis not present

## 2017-07-29 DIAGNOSIS — Z992 Dependence on renal dialysis: Secondary | ICD-10-CM | POA: Diagnosis not present

## 2017-07-29 DIAGNOSIS — D630 Anemia in neoplastic disease: Secondary | ICD-10-CM | POA: Diagnosis not present

## 2017-07-29 DIAGNOSIS — Z4932 Encounter for adequacy testing for peritoneal dialysis: Secondary | ICD-10-CM | POA: Diagnosis not present

## 2017-07-30 DIAGNOSIS — N186 End stage renal disease: Secondary | ICD-10-CM | POA: Diagnosis not present

## 2017-07-30 DIAGNOSIS — D631 Anemia in chronic kidney disease: Secondary | ICD-10-CM | POA: Diagnosis not present

## 2017-07-30 DIAGNOSIS — N2581 Secondary hyperparathyroidism of renal origin: Secondary | ICD-10-CM | POA: Diagnosis not present

## 2017-07-30 DIAGNOSIS — E440 Moderate protein-calorie malnutrition: Secondary | ICD-10-CM | POA: Diagnosis not present

## 2017-07-30 DIAGNOSIS — D630 Anemia in neoplastic disease: Secondary | ICD-10-CM | POA: Diagnosis not present

## 2017-07-31 DIAGNOSIS — N2581 Secondary hyperparathyroidism of renal origin: Secondary | ICD-10-CM | POA: Diagnosis not present

## 2017-07-31 DIAGNOSIS — N186 End stage renal disease: Secondary | ICD-10-CM | POA: Diagnosis not present

## 2017-07-31 DIAGNOSIS — E440 Moderate protein-calorie malnutrition: Secondary | ICD-10-CM | POA: Diagnosis not present

## 2017-07-31 DIAGNOSIS — D630 Anemia in neoplastic disease: Secondary | ICD-10-CM | POA: Diagnosis not present

## 2017-07-31 DIAGNOSIS — D631 Anemia in chronic kidney disease: Secondary | ICD-10-CM | POA: Diagnosis not present

## 2017-08-01 DIAGNOSIS — E440 Moderate protein-calorie malnutrition: Secondary | ICD-10-CM | POA: Diagnosis not present

## 2017-08-01 DIAGNOSIS — D631 Anemia in chronic kidney disease: Secondary | ICD-10-CM | POA: Diagnosis not present

## 2017-08-01 DIAGNOSIS — D630 Anemia in neoplastic disease: Secondary | ICD-10-CM | POA: Diagnosis not present

## 2017-08-01 DIAGNOSIS — N186 End stage renal disease: Secondary | ICD-10-CM | POA: Diagnosis not present

## 2017-08-01 DIAGNOSIS — N2581 Secondary hyperparathyroidism of renal origin: Secondary | ICD-10-CM | POA: Diagnosis not present

## 2017-08-02 DIAGNOSIS — E440 Moderate protein-calorie malnutrition: Secondary | ICD-10-CM | POA: Diagnosis not present

## 2017-08-02 DIAGNOSIS — N2581 Secondary hyperparathyroidism of renal origin: Secondary | ICD-10-CM | POA: Diagnosis not present

## 2017-08-02 DIAGNOSIS — D630 Anemia in neoplastic disease: Secondary | ICD-10-CM | POA: Diagnosis not present

## 2017-08-02 DIAGNOSIS — N186 End stage renal disease: Secondary | ICD-10-CM | POA: Diagnosis not present

## 2017-08-02 DIAGNOSIS — D631 Anemia in chronic kidney disease: Secondary | ICD-10-CM | POA: Diagnosis not present

## 2017-08-03 DIAGNOSIS — D630 Anemia in neoplastic disease: Secondary | ICD-10-CM | POA: Diagnosis not present

## 2017-08-03 DIAGNOSIS — N2581 Secondary hyperparathyroidism of renal origin: Secondary | ICD-10-CM | POA: Diagnosis not present

## 2017-08-03 DIAGNOSIS — D631 Anemia in chronic kidney disease: Secondary | ICD-10-CM | POA: Diagnosis not present

## 2017-08-03 DIAGNOSIS — N186 End stage renal disease: Secondary | ICD-10-CM | POA: Diagnosis not present

## 2017-08-03 DIAGNOSIS — E440 Moderate protein-calorie malnutrition: Secondary | ICD-10-CM | POA: Diagnosis not present

## 2017-08-04 DIAGNOSIS — D631 Anemia in chronic kidney disease: Secondary | ICD-10-CM | POA: Diagnosis not present

## 2017-08-04 DIAGNOSIS — N186 End stage renal disease: Secondary | ICD-10-CM | POA: Diagnosis not present

## 2017-08-04 DIAGNOSIS — E440 Moderate protein-calorie malnutrition: Secondary | ICD-10-CM | POA: Diagnosis not present

## 2017-08-04 DIAGNOSIS — D630 Anemia in neoplastic disease: Secondary | ICD-10-CM | POA: Diagnosis not present

## 2017-08-04 DIAGNOSIS — N2581 Secondary hyperparathyroidism of renal origin: Secondary | ICD-10-CM | POA: Diagnosis not present

## 2017-08-05 DIAGNOSIS — D630 Anemia in neoplastic disease: Secondary | ICD-10-CM | POA: Diagnosis not present

## 2017-08-05 DIAGNOSIS — N2581 Secondary hyperparathyroidism of renal origin: Secondary | ICD-10-CM | POA: Diagnosis not present

## 2017-08-05 DIAGNOSIS — E440 Moderate protein-calorie malnutrition: Secondary | ICD-10-CM | POA: Diagnosis not present

## 2017-08-05 DIAGNOSIS — D631 Anemia in chronic kidney disease: Secondary | ICD-10-CM | POA: Diagnosis not present

## 2017-08-05 DIAGNOSIS — N186 End stage renal disease: Secondary | ICD-10-CM | POA: Diagnosis not present

## 2017-08-06 DIAGNOSIS — E440 Moderate protein-calorie malnutrition: Secondary | ICD-10-CM | POA: Diagnosis not present

## 2017-08-06 DIAGNOSIS — N186 End stage renal disease: Secondary | ICD-10-CM | POA: Diagnosis not present

## 2017-08-06 DIAGNOSIS — E7849 Other hyperlipidemia: Secondary | ICD-10-CM | POA: Diagnosis not present

## 2017-08-06 DIAGNOSIS — R82998 Other abnormal findings in urine: Secondary | ICD-10-CM | POA: Diagnosis not present

## 2017-08-06 DIAGNOSIS — D630 Anemia in neoplastic disease: Secondary | ICD-10-CM | POA: Diagnosis not present

## 2017-08-06 DIAGNOSIS — N2581 Secondary hyperparathyroidism of renal origin: Secondary | ICD-10-CM | POA: Diagnosis not present

## 2017-08-06 DIAGNOSIS — D631 Anemia in chronic kidney disease: Secondary | ICD-10-CM | POA: Diagnosis not present

## 2017-08-07 DIAGNOSIS — N186 End stage renal disease: Secondary | ICD-10-CM | POA: Diagnosis not present

## 2017-08-07 DIAGNOSIS — E440 Moderate protein-calorie malnutrition: Secondary | ICD-10-CM | POA: Diagnosis not present

## 2017-08-07 DIAGNOSIS — N2581 Secondary hyperparathyroidism of renal origin: Secondary | ICD-10-CM | POA: Diagnosis not present

## 2017-08-07 DIAGNOSIS — D631 Anemia in chronic kidney disease: Secondary | ICD-10-CM | POA: Diagnosis not present

## 2017-08-07 DIAGNOSIS — D630 Anemia in neoplastic disease: Secondary | ICD-10-CM | POA: Diagnosis not present

## 2017-08-08 DIAGNOSIS — D630 Anemia in neoplastic disease: Secondary | ICD-10-CM | POA: Diagnosis not present

## 2017-08-08 DIAGNOSIS — N2581 Secondary hyperparathyroidism of renal origin: Secondary | ICD-10-CM | POA: Diagnosis not present

## 2017-08-08 DIAGNOSIS — E440 Moderate protein-calorie malnutrition: Secondary | ICD-10-CM | POA: Diagnosis not present

## 2017-08-08 DIAGNOSIS — D631 Anemia in chronic kidney disease: Secondary | ICD-10-CM | POA: Diagnosis not present

## 2017-08-08 DIAGNOSIS — N186 End stage renal disease: Secondary | ICD-10-CM | POA: Diagnosis not present

## 2017-08-09 DIAGNOSIS — D631 Anemia in chronic kidney disease: Secondary | ICD-10-CM | POA: Diagnosis not present

## 2017-08-09 DIAGNOSIS — D630 Anemia in neoplastic disease: Secondary | ICD-10-CM | POA: Diagnosis not present

## 2017-08-09 DIAGNOSIS — N186 End stage renal disease: Secondary | ICD-10-CM | POA: Diagnosis not present

## 2017-08-09 DIAGNOSIS — E440 Moderate protein-calorie malnutrition: Secondary | ICD-10-CM | POA: Diagnosis not present

## 2017-08-09 DIAGNOSIS — N2581 Secondary hyperparathyroidism of renal origin: Secondary | ICD-10-CM | POA: Diagnosis not present

## 2017-08-10 DIAGNOSIS — N2581 Secondary hyperparathyroidism of renal origin: Secondary | ICD-10-CM | POA: Diagnosis not present

## 2017-08-10 DIAGNOSIS — D631 Anemia in chronic kidney disease: Secondary | ICD-10-CM | POA: Diagnosis not present

## 2017-08-10 DIAGNOSIS — N186 End stage renal disease: Secondary | ICD-10-CM | POA: Diagnosis not present

## 2017-08-10 DIAGNOSIS — D630 Anemia in neoplastic disease: Secondary | ICD-10-CM | POA: Diagnosis not present

## 2017-08-10 DIAGNOSIS — E440 Moderate protein-calorie malnutrition: Secondary | ICD-10-CM | POA: Diagnosis not present

## 2017-08-11 DIAGNOSIS — N186 End stage renal disease: Secondary | ICD-10-CM | POA: Diagnosis not present

## 2017-08-11 DIAGNOSIS — E440 Moderate protein-calorie malnutrition: Secondary | ICD-10-CM | POA: Diagnosis not present

## 2017-08-11 DIAGNOSIS — N2581 Secondary hyperparathyroidism of renal origin: Secondary | ICD-10-CM | POA: Diagnosis not present

## 2017-08-11 DIAGNOSIS — D631 Anemia in chronic kidney disease: Secondary | ICD-10-CM | POA: Diagnosis not present

## 2017-08-11 DIAGNOSIS — D630 Anemia in neoplastic disease: Secondary | ICD-10-CM | POA: Diagnosis not present

## 2017-08-12 DIAGNOSIS — X58XXXA Exposure to other specified factors, initial encounter: Secondary | ICD-10-CM | POA: Diagnosis not present

## 2017-08-12 DIAGNOSIS — R5383 Other fatigue: Secondary | ICD-10-CM | POA: Diagnosis not present

## 2017-08-12 DIAGNOSIS — I120 Hypertensive chronic kidney disease with stage 5 chronic kidney disease or end stage renal disease: Secondary | ICD-10-CM | POA: Diagnosis not present

## 2017-08-12 DIAGNOSIS — N186 End stage renal disease: Secondary | ICD-10-CM | POA: Diagnosis not present

## 2017-08-12 DIAGNOSIS — R109 Unspecified abdominal pain: Secondary | ICD-10-CM | POA: Diagnosis not present

## 2017-08-12 DIAGNOSIS — I6381 Other cerebral infarction due to occlusion or stenosis of small artery: Secondary | ICD-10-CM | POA: Diagnosis not present

## 2017-08-12 DIAGNOSIS — E871 Hypo-osmolality and hyponatremia: Secondary | ICD-10-CM | POA: Diagnosis not present

## 2017-08-12 DIAGNOSIS — J9811 Atelectasis: Secondary | ICD-10-CM | POA: Diagnosis not present

## 2017-08-12 DIAGNOSIS — R4182 Altered mental status, unspecified: Secondary | ICD-10-CM | POA: Diagnosis not present

## 2017-08-12 DIAGNOSIS — N2581 Secondary hyperparathyroidism of renal origin: Secondary | ICD-10-CM | POA: Diagnosis not present

## 2017-08-12 DIAGNOSIS — R188 Other ascites: Secondary | ICD-10-CM | POA: Diagnosis not present

## 2017-08-12 DIAGNOSIS — N390 Urinary tract infection, site not specified: Secondary | ICD-10-CM | POA: Diagnosis not present

## 2017-08-12 DIAGNOSIS — T68XXXA Hypothermia, initial encounter: Secondary | ICD-10-CM | POA: Diagnosis not present

## 2017-08-12 DIAGNOSIS — K5900 Constipation, unspecified: Secondary | ICD-10-CM | POA: Diagnosis not present

## 2017-08-12 DIAGNOSIS — R531 Weakness: Secondary | ICD-10-CM | POA: Diagnosis not present

## 2017-08-12 DIAGNOSIS — Y998 Other external cause status: Secondary | ICD-10-CM | POA: Diagnosis not present

## 2017-08-13 DIAGNOSIS — J9811 Atelectasis: Secondary | ICD-10-CM | POA: Diagnosis not present

## 2017-08-13 DIAGNOSIS — I6381 Other cerebral infarction due to occlusion or stenosis of small artery: Secondary | ICD-10-CM | POA: Diagnosis present

## 2017-08-13 DIAGNOSIS — R1310 Dysphagia, unspecified: Secondary | ICD-10-CM | POA: Diagnosis not present

## 2017-08-13 DIAGNOSIS — R4182 Altered mental status, unspecified: Secondary | ICD-10-CM | POA: Diagnosis not present

## 2017-08-13 DIAGNOSIS — R109 Unspecified abdominal pain: Secondary | ICD-10-CM | POA: Diagnosis not present

## 2017-08-13 DIAGNOSIS — F209 Schizophrenia, unspecified: Secondary | ICD-10-CM | POA: Diagnosis not present

## 2017-08-13 DIAGNOSIS — I10 Essential (primary) hypertension: Secondary | ICD-10-CM | POA: Diagnosis not present

## 2017-08-13 DIAGNOSIS — F319 Bipolar disorder, unspecified: Secondary | ICD-10-CM | POA: Diagnosis present

## 2017-08-13 DIAGNOSIS — N2889 Other specified disorders of kidney and ureter: Secondary | ICD-10-CM | POA: Diagnosis not present

## 2017-08-13 DIAGNOSIS — E039 Hypothyroidism, unspecified: Secondary | ICD-10-CM | POA: Diagnosis not present

## 2017-08-13 DIAGNOSIS — R296 Repeated falls: Secondary | ICD-10-CM | POA: Diagnosis present

## 2017-08-13 DIAGNOSIS — R9431 Abnormal electrocardiogram [ECG] [EKG]: Secondary | ICD-10-CM | POA: Diagnosis not present

## 2017-08-13 DIAGNOSIS — R410 Disorientation, unspecified: Secondary | ICD-10-CM | POA: Diagnosis not present

## 2017-08-13 DIAGNOSIS — N390 Urinary tract infection, site not specified: Secondary | ICD-10-CM | POA: Diagnosis not present

## 2017-08-13 DIAGNOSIS — E876 Hypokalemia: Secondary | ICD-10-CM | POA: Diagnosis present

## 2017-08-13 DIAGNOSIS — I639 Cerebral infarction, unspecified: Secondary | ICD-10-CM | POA: Diagnosis not present

## 2017-08-13 DIAGNOSIS — Z992 Dependence on renal dialysis: Secondary | ICD-10-CM | POA: Diagnosis not present

## 2017-08-13 DIAGNOSIS — R680 Hypothermia, not associated with low environmental temperature: Secondary | ICD-10-CM | POA: Diagnosis not present

## 2017-08-13 DIAGNOSIS — B9620 Unspecified Escherichia coli [E. coli] as the cause of diseases classified elsewhere: Secondary | ICD-10-CM | POA: Diagnosis not present

## 2017-08-13 DIAGNOSIS — I120 Hypertensive chronic kidney disease with stage 5 chronic kidney disease or end stage renal disease: Secondary | ICD-10-CM | POA: Diagnosis present

## 2017-08-13 DIAGNOSIS — R188 Other ascites: Secondary | ICD-10-CM | POA: Diagnosis not present

## 2017-08-13 DIAGNOSIS — Z7409 Other reduced mobility: Secondary | ICD-10-CM | POA: Diagnosis not present

## 2017-08-13 DIAGNOSIS — T68XXXA Hypothermia, initial encounter: Secondary | ICD-10-CM | POA: Diagnosis not present

## 2017-08-13 DIAGNOSIS — R918 Other nonspecific abnormal finding of lung field: Secondary | ICD-10-CM | POA: Diagnosis not present

## 2017-08-13 DIAGNOSIS — E871 Hypo-osmolality and hyponatremia: Secondary | ICD-10-CM | POA: Diagnosis not present

## 2017-08-13 DIAGNOSIS — Z1612 Extended spectrum beta lactamase (ESBL) resistance: Secondary | ICD-10-CM | POA: Diagnosis not present

## 2017-08-13 DIAGNOSIS — Z792 Long term (current) use of antibiotics: Secondary | ICD-10-CM | POA: Diagnosis not present

## 2017-08-13 DIAGNOSIS — R5383 Other fatigue: Secondary | ICD-10-CM | POA: Diagnosis not present

## 2017-08-13 DIAGNOSIS — N186 End stage renal disease: Secondary | ICD-10-CM | POA: Diagnosis present

## 2017-08-13 DIAGNOSIS — I6389 Other cerebral infarction: Secondary | ICD-10-CM | POA: Diagnosis not present

## 2017-08-13 DIAGNOSIS — G9340 Encephalopathy, unspecified: Secondary | ICD-10-CM | POA: Diagnosis not present

## 2017-08-13 DIAGNOSIS — K5900 Constipation, unspecified: Secondary | ICD-10-CM | POA: Diagnosis not present

## 2017-08-13 DIAGNOSIS — D631 Anemia in chronic kidney disease: Secondary | ICD-10-CM | POA: Diagnosis not present

## 2017-08-13 DIAGNOSIS — Z7982 Long term (current) use of aspirin: Secondary | ICD-10-CM | POA: Diagnosis not present

## 2017-08-13 DIAGNOSIS — Z781 Physical restraint status: Secondary | ICD-10-CM | POA: Diagnosis not present

## 2017-08-13 DIAGNOSIS — E8770 Fluid overload, unspecified: Secondary | ICD-10-CM | POA: Diagnosis present

## 2017-08-13 DIAGNOSIS — N2581 Secondary hyperparathyroidism of renal origin: Secondary | ICD-10-CM | POA: Diagnosis present

## 2017-08-13 DIAGNOSIS — R531 Weakness: Secondary | ICD-10-CM | POA: Diagnosis not present

## 2017-08-21 DIAGNOSIS — N390 Urinary tract infection, site not specified: Secondary | ICD-10-CM | POA: Diagnosis not present

## 2017-08-21 DIAGNOSIS — N186 End stage renal disease: Secondary | ICD-10-CM | POA: Diagnosis not present

## 2017-08-21 DIAGNOSIS — D630 Anemia in neoplastic disease: Secondary | ICD-10-CM | POA: Diagnosis not present

## 2017-08-21 DIAGNOSIS — D631 Anemia in chronic kidney disease: Secondary | ICD-10-CM | POA: Diagnosis not present

## 2017-08-21 DIAGNOSIS — B9629 Other Escherichia coli [E. coli] as the cause of diseases classified elsewhere: Secondary | ICD-10-CM | POA: Diagnosis not present

## 2017-08-21 DIAGNOSIS — E440 Moderate protein-calorie malnutrition: Secondary | ICD-10-CM | POA: Diagnosis not present

## 2017-08-21 DIAGNOSIS — N2581 Secondary hyperparathyroidism of renal origin: Secondary | ICD-10-CM | POA: Diagnosis not present

## 2017-08-22 DIAGNOSIS — E440 Moderate protein-calorie malnutrition: Secondary | ICD-10-CM | POA: Diagnosis not present

## 2017-08-22 DIAGNOSIS — B9629 Other Escherichia coli [E. coli] as the cause of diseases classified elsewhere: Secondary | ICD-10-CM | POA: Diagnosis not present

## 2017-08-22 DIAGNOSIS — D631 Anemia in chronic kidney disease: Secondary | ICD-10-CM | POA: Diagnosis not present

## 2017-08-22 DIAGNOSIS — N390 Urinary tract infection, site not specified: Secondary | ICD-10-CM | POA: Diagnosis not present

## 2017-08-22 DIAGNOSIS — D630 Anemia in neoplastic disease: Secondary | ICD-10-CM | POA: Diagnosis not present

## 2017-08-22 DIAGNOSIS — N186 End stage renal disease: Secondary | ICD-10-CM | POA: Diagnosis not present

## 2017-08-22 DIAGNOSIS — N2581 Secondary hyperparathyroidism of renal origin: Secondary | ICD-10-CM | POA: Diagnosis not present

## 2017-08-22 MED ORDER — IPRATROPIUM-ALBUTEROL 0.5-2.5 (3) MG/3ML IN SOLN
3.00 | RESPIRATORY_TRACT | Status: DC
Start: ? — End: 2017-08-22

## 2017-08-22 MED ORDER — ACETAMINOPHEN 325 MG PO TABS
650.00 | ORAL_TABLET | ORAL | Status: DC
Start: ? — End: 2017-08-22

## 2017-08-22 MED ORDER — BISACODYL 10 MG RE SUPP
10.00 | RECTAL | Status: DC
Start: ? — End: 2017-08-22

## 2017-08-22 MED ORDER — LEVOTHYROXINE SODIUM 125 MCG PO TABS
62.50 | ORAL_TABLET | ORAL | Status: DC
Start: 2017-08-21 — End: 2017-08-22

## 2017-08-22 MED ORDER — NYSTATIN 100000 UNIT/ML MT SUSP
500000.00 | OROMUCOSAL | Status: DC
Start: 2017-08-20 — End: 2017-08-22

## 2017-08-22 MED ORDER — ASPIRIN EC 81 MG PO TBEC
81.00 | DELAYED_RELEASE_TABLET | ORAL | Status: DC
Start: 2017-08-21 — End: 2017-08-22

## 2017-08-22 MED ORDER — GENERIC EXTERNAL MEDICATION
1.00 | Status: DC
Start: 2017-08-21 — End: 2017-08-22

## 2017-08-22 MED ORDER — GENERIC EXTERNAL MEDICATION
500.00 | Status: DC
Start: 2017-08-21 — End: 2017-08-22

## 2017-08-22 MED ORDER — DIVALPROEX SODIUM 250 MG PO DR TAB
500.00 | DELAYED_RELEASE_TABLET | ORAL | Status: DC
Start: 2017-08-20 — End: 2017-08-22

## 2017-08-22 MED ORDER — GENTAMICIN SULFATE 0.1 % EX CREA
1.00 | TOPICAL_CREAM | CUTANEOUS | Status: DC
Start: ? — End: 2017-08-22

## 2017-08-22 MED ORDER — ONDANSETRON 4 MG PO TBDP
4.00 | ORAL_TABLET | ORAL | Status: DC
Start: ? — End: 2017-08-22

## 2017-08-22 MED ORDER — RISPERIDONE 1 MG PO TABS
1.00 | ORAL_TABLET | ORAL | Status: DC
Start: 2017-08-20 — End: 2017-08-22

## 2017-08-22 MED ORDER — ATORVASTATIN CALCIUM 10 MG PO TABS
10.00 | ORAL_TABLET | ORAL | Status: DC
Start: 2017-08-20 — End: 2017-08-22

## 2017-08-23 ENCOUNTER — Telehealth: Payer: Self-pay | Admitting: Family Medicine

## 2017-08-23 DIAGNOSIS — D631 Anemia in chronic kidney disease: Secondary | ICD-10-CM | POA: Diagnosis not present

## 2017-08-23 DIAGNOSIS — D630 Anemia in neoplastic disease: Secondary | ICD-10-CM | POA: Diagnosis not present

## 2017-08-23 DIAGNOSIS — E440 Moderate protein-calorie malnutrition: Secondary | ICD-10-CM | POA: Diagnosis not present

## 2017-08-23 DIAGNOSIS — N2581 Secondary hyperparathyroidism of renal origin: Secondary | ICD-10-CM | POA: Diagnosis not present

## 2017-08-23 DIAGNOSIS — N186 End stage renal disease: Secondary | ICD-10-CM | POA: Diagnosis not present

## 2017-08-24 DIAGNOSIS — D631 Anemia in chronic kidney disease: Secondary | ICD-10-CM | POA: Diagnosis not present

## 2017-08-24 DIAGNOSIS — N186 End stage renal disease: Secondary | ICD-10-CM | POA: Diagnosis not present

## 2017-08-24 DIAGNOSIS — D630 Anemia in neoplastic disease: Secondary | ICD-10-CM | POA: Diagnosis not present

## 2017-08-24 DIAGNOSIS — N2581 Secondary hyperparathyroidism of renal origin: Secondary | ICD-10-CM | POA: Diagnosis not present

## 2017-08-24 DIAGNOSIS — E440 Moderate protein-calorie malnutrition: Secondary | ICD-10-CM | POA: Diagnosis not present

## 2017-08-25 DIAGNOSIS — D630 Anemia in neoplastic disease: Secondary | ICD-10-CM | POA: Diagnosis not present

## 2017-08-25 DIAGNOSIS — D631 Anemia in chronic kidney disease: Secondary | ICD-10-CM | POA: Diagnosis not present

## 2017-08-25 DIAGNOSIS — N186 End stage renal disease: Secondary | ICD-10-CM | POA: Diagnosis not present

## 2017-08-25 DIAGNOSIS — N2581 Secondary hyperparathyroidism of renal origin: Secondary | ICD-10-CM | POA: Diagnosis not present

## 2017-08-25 DIAGNOSIS — E440 Moderate protein-calorie malnutrition: Secondary | ICD-10-CM | POA: Diagnosis not present

## 2017-08-26 DIAGNOSIS — N390 Urinary tract infection, site not specified: Secondary | ICD-10-CM | POA: Diagnosis not present

## 2017-08-26 DIAGNOSIS — N2581 Secondary hyperparathyroidism of renal origin: Secondary | ICD-10-CM | POA: Diagnosis not present

## 2017-08-26 DIAGNOSIS — N186 End stage renal disease: Secondary | ICD-10-CM | POA: Diagnosis not present

## 2017-08-26 DIAGNOSIS — B9629 Other Escherichia coli [E. coli] as the cause of diseases classified elsewhere: Secondary | ICD-10-CM | POA: Diagnosis not present

## 2017-08-26 DIAGNOSIS — E440 Moderate protein-calorie malnutrition: Secondary | ICD-10-CM | POA: Diagnosis not present

## 2017-08-26 DIAGNOSIS — D630 Anemia in neoplastic disease: Secondary | ICD-10-CM | POA: Diagnosis not present

## 2017-08-26 DIAGNOSIS — D631 Anemia in chronic kidney disease: Secondary | ICD-10-CM | POA: Diagnosis not present

## 2017-08-27 DIAGNOSIS — E440 Moderate protein-calorie malnutrition: Secondary | ICD-10-CM | POA: Diagnosis not present

## 2017-08-27 DIAGNOSIS — N186 End stage renal disease: Secondary | ICD-10-CM | POA: Diagnosis not present

## 2017-08-27 DIAGNOSIS — D631 Anemia in chronic kidney disease: Secondary | ICD-10-CM | POA: Diagnosis not present

## 2017-08-27 DIAGNOSIS — D630 Anemia in neoplastic disease: Secondary | ICD-10-CM | POA: Diagnosis not present

## 2017-08-27 DIAGNOSIS — N2581 Secondary hyperparathyroidism of renal origin: Secondary | ICD-10-CM | POA: Diagnosis not present

## 2017-08-28 DIAGNOSIS — N2581 Secondary hyperparathyroidism of renal origin: Secondary | ICD-10-CM | POA: Diagnosis not present

## 2017-08-28 DIAGNOSIS — B9629 Other Escherichia coli [E. coli] as the cause of diseases classified elsewhere: Secondary | ICD-10-CM | POA: Diagnosis not present

## 2017-08-28 DIAGNOSIS — N390 Urinary tract infection, site not specified: Secondary | ICD-10-CM | POA: Diagnosis not present

## 2017-08-28 DIAGNOSIS — N186 End stage renal disease: Secondary | ICD-10-CM | POA: Diagnosis not present

## 2017-08-28 DIAGNOSIS — D630 Anemia in neoplastic disease: Secondary | ICD-10-CM | POA: Diagnosis not present

## 2017-08-28 DIAGNOSIS — D631 Anemia in chronic kidney disease: Secondary | ICD-10-CM | POA: Diagnosis not present

## 2017-08-28 DIAGNOSIS — E440 Moderate protein-calorie malnutrition: Secondary | ICD-10-CM | POA: Diagnosis not present

## 2017-08-29 ENCOUNTER — Telehealth: Payer: Self-pay | Admitting: Family Medicine

## 2017-08-29 DIAGNOSIS — D509 Iron deficiency anemia, unspecified: Secondary | ICD-10-CM | POA: Diagnosis not present

## 2017-08-29 DIAGNOSIS — Z992 Dependence on renal dialysis: Secondary | ICD-10-CM | POA: Diagnosis not present

## 2017-08-29 DIAGNOSIS — R17 Unspecified jaundice: Secondary | ICD-10-CM | POA: Diagnosis not present

## 2017-08-29 DIAGNOSIS — D631 Anemia in chronic kidney disease: Secondary | ICD-10-CM | POA: Diagnosis not present

## 2017-08-29 DIAGNOSIS — E440 Moderate protein-calorie malnutrition: Secondary | ICD-10-CM | POA: Diagnosis not present

## 2017-08-29 DIAGNOSIS — I7789 Other specified disorders of arteries and arterioles: Secondary | ICD-10-CM | POA: Diagnosis not present

## 2017-08-29 DIAGNOSIS — Z4932 Encounter for adequacy testing for peritoneal dialysis: Secondary | ICD-10-CM | POA: Diagnosis not present

## 2017-08-29 DIAGNOSIS — R4182 Altered mental status, unspecified: Secondary | ICD-10-CM | POA: Diagnosis not present

## 2017-08-29 DIAGNOSIS — Z452 Encounter for adjustment and management of vascular access device: Secondary | ICD-10-CM | POA: Diagnosis not present

## 2017-08-29 DIAGNOSIS — N2581 Secondary hyperparathyroidism of renal origin: Secondary | ICD-10-CM | POA: Diagnosis not present

## 2017-08-29 DIAGNOSIS — N186 End stage renal disease: Secondary | ICD-10-CM | POA: Diagnosis not present

## 2017-08-29 DIAGNOSIS — Z79899 Other long term (current) drug therapy: Secondary | ICD-10-CM | POA: Diagnosis not present

## 2017-08-29 NOTE — Telephone Encounter (Signed)
Darren Dunn at [facility] is needing orders on patient to treat a skin tear on his left arm.  Size is 5 x 5.  If they treat this, they will need to have home health extended for at least 2 more weeks.  Patient was admitted to [facility] on 08-13-17 and discharged to home care on 08-20-17.  Please call.

## 2017-08-29 NOTE — Telephone Encounter (Signed)
Verbal ok given.

## 2017-08-30 DIAGNOSIS — E440 Moderate protein-calorie malnutrition: Secondary | ICD-10-CM | POA: Diagnosis not present

## 2017-08-30 DIAGNOSIS — D631 Anemia in chronic kidney disease: Secondary | ICD-10-CM | POA: Diagnosis not present

## 2017-08-30 DIAGNOSIS — N186 End stage renal disease: Secondary | ICD-10-CM | POA: Diagnosis not present

## 2017-08-30 DIAGNOSIS — N2581 Secondary hyperparathyroidism of renal origin: Secondary | ICD-10-CM | POA: Diagnosis not present

## 2017-08-30 DIAGNOSIS — Z452 Encounter for adjustment and management of vascular access device: Secondary | ICD-10-CM | POA: Diagnosis not present

## 2017-08-30 DIAGNOSIS — R4182 Altered mental status, unspecified: Secondary | ICD-10-CM | POA: Diagnosis not present

## 2017-08-30 DIAGNOSIS — D509 Iron deficiency anemia, unspecified: Secondary | ICD-10-CM | POA: Diagnosis not present

## 2017-08-30 DIAGNOSIS — Z4932 Encounter for adequacy testing for peritoneal dialysis: Secondary | ICD-10-CM | POA: Diagnosis not present

## 2017-08-31 DIAGNOSIS — D509 Iron deficiency anemia, unspecified: Secondary | ICD-10-CM | POA: Diagnosis not present

## 2017-08-31 DIAGNOSIS — Z4932 Encounter for adequacy testing for peritoneal dialysis: Secondary | ICD-10-CM | POA: Diagnosis not present

## 2017-08-31 DIAGNOSIS — N186 End stage renal disease: Secondary | ICD-10-CM | POA: Diagnosis not present

## 2017-08-31 DIAGNOSIS — E440 Moderate protein-calorie malnutrition: Secondary | ICD-10-CM | POA: Diagnosis not present

## 2017-08-31 DIAGNOSIS — D631 Anemia in chronic kidney disease: Secondary | ICD-10-CM | POA: Diagnosis not present

## 2017-08-31 DIAGNOSIS — N2581 Secondary hyperparathyroidism of renal origin: Secondary | ICD-10-CM | POA: Diagnosis not present

## 2017-09-01 DIAGNOSIS — D509 Iron deficiency anemia, unspecified: Secondary | ICD-10-CM | POA: Diagnosis not present

## 2017-09-01 DIAGNOSIS — N186 End stage renal disease: Secondary | ICD-10-CM | POA: Diagnosis not present

## 2017-09-01 DIAGNOSIS — D631 Anemia in chronic kidney disease: Secondary | ICD-10-CM | POA: Diagnosis not present

## 2017-09-01 DIAGNOSIS — N2581 Secondary hyperparathyroidism of renal origin: Secondary | ICD-10-CM | POA: Diagnosis not present

## 2017-09-01 DIAGNOSIS — E440 Moderate protein-calorie malnutrition: Secondary | ICD-10-CM | POA: Diagnosis not present

## 2017-09-01 DIAGNOSIS — Z4932 Encounter for adequacy testing for peritoneal dialysis: Secondary | ICD-10-CM | POA: Diagnosis not present

## 2017-09-02 DIAGNOSIS — D631 Anemia in chronic kidney disease: Secondary | ICD-10-CM | POA: Diagnosis not present

## 2017-09-02 DIAGNOSIS — E440 Moderate protein-calorie malnutrition: Secondary | ICD-10-CM | POA: Diagnosis not present

## 2017-09-02 DIAGNOSIS — Z4932 Encounter for adequacy testing for peritoneal dialysis: Secondary | ICD-10-CM | POA: Diagnosis not present

## 2017-09-02 DIAGNOSIS — N2581 Secondary hyperparathyroidism of renal origin: Secondary | ICD-10-CM | POA: Diagnosis not present

## 2017-09-02 DIAGNOSIS — D509 Iron deficiency anemia, unspecified: Secondary | ICD-10-CM | POA: Diagnosis not present

## 2017-09-02 DIAGNOSIS — N186 End stage renal disease: Secondary | ICD-10-CM | POA: Diagnosis not present

## 2017-09-02 DIAGNOSIS — N390 Urinary tract infection, site not specified: Secondary | ICD-10-CM | POA: Diagnosis not present

## 2017-09-02 DIAGNOSIS — B9629 Other Escherichia coli [E. coli] as the cause of diseases classified elsewhere: Secondary | ICD-10-CM | POA: Diagnosis not present

## 2017-09-03 DIAGNOSIS — N2581 Secondary hyperparathyroidism of renal origin: Secondary | ICD-10-CM | POA: Diagnosis not present

## 2017-09-03 DIAGNOSIS — N186 End stage renal disease: Secondary | ICD-10-CM | POA: Diagnosis not present

## 2017-09-03 DIAGNOSIS — Z4932 Encounter for adequacy testing for peritoneal dialysis: Secondary | ICD-10-CM | POA: Diagnosis not present

## 2017-09-03 DIAGNOSIS — E440 Moderate protein-calorie malnutrition: Secondary | ICD-10-CM | POA: Diagnosis not present

## 2017-09-03 DIAGNOSIS — D631 Anemia in chronic kidney disease: Secondary | ICD-10-CM | POA: Diagnosis not present

## 2017-09-03 DIAGNOSIS — D509 Iron deficiency anemia, unspecified: Secondary | ICD-10-CM | POA: Diagnosis not present

## 2017-09-04 DIAGNOSIS — E440 Moderate protein-calorie malnutrition: Secondary | ICD-10-CM | POA: Diagnosis not present

## 2017-09-04 DIAGNOSIS — N186 End stage renal disease: Secondary | ICD-10-CM | POA: Diagnosis not present

## 2017-09-04 DIAGNOSIS — N2581 Secondary hyperparathyroidism of renal origin: Secondary | ICD-10-CM | POA: Diagnosis not present

## 2017-09-04 DIAGNOSIS — B9629 Other Escherichia coli [E. coli] as the cause of diseases classified elsewhere: Secondary | ICD-10-CM | POA: Diagnosis not present

## 2017-09-04 DIAGNOSIS — D631 Anemia in chronic kidney disease: Secondary | ICD-10-CM | POA: Diagnosis not present

## 2017-09-04 DIAGNOSIS — N390 Urinary tract infection, site not specified: Secondary | ICD-10-CM | POA: Diagnosis not present

## 2017-09-04 DIAGNOSIS — D509 Iron deficiency anemia, unspecified: Secondary | ICD-10-CM | POA: Diagnosis not present

## 2017-09-04 DIAGNOSIS — Z4932 Encounter for adequacy testing for peritoneal dialysis: Secondary | ICD-10-CM | POA: Diagnosis not present

## 2017-09-05 DIAGNOSIS — E440 Moderate protein-calorie malnutrition: Secondary | ICD-10-CM | POA: Diagnosis not present

## 2017-09-05 DIAGNOSIS — D509 Iron deficiency anemia, unspecified: Secondary | ICD-10-CM | POA: Diagnosis not present

## 2017-09-05 DIAGNOSIS — N2581 Secondary hyperparathyroidism of renal origin: Secondary | ICD-10-CM | POA: Diagnosis not present

## 2017-09-05 DIAGNOSIS — Z4932 Encounter for adequacy testing for peritoneal dialysis: Secondary | ICD-10-CM | POA: Diagnosis not present

## 2017-09-05 DIAGNOSIS — D631 Anemia in chronic kidney disease: Secondary | ICD-10-CM | POA: Diagnosis not present

## 2017-09-05 DIAGNOSIS — N186 End stage renal disease: Secondary | ICD-10-CM | POA: Diagnosis not present

## 2017-09-06 DIAGNOSIS — Z4932 Encounter for adequacy testing for peritoneal dialysis: Secondary | ICD-10-CM | POA: Diagnosis not present

## 2017-09-06 DIAGNOSIS — N186 End stage renal disease: Secondary | ICD-10-CM | POA: Diagnosis not present

## 2017-09-06 DIAGNOSIS — D631 Anemia in chronic kidney disease: Secondary | ICD-10-CM | POA: Diagnosis not present

## 2017-09-06 DIAGNOSIS — E440 Moderate protein-calorie malnutrition: Secondary | ICD-10-CM | POA: Diagnosis not present

## 2017-09-06 DIAGNOSIS — N2581 Secondary hyperparathyroidism of renal origin: Secondary | ICD-10-CM | POA: Diagnosis not present

## 2017-09-06 DIAGNOSIS — N390 Urinary tract infection, site not specified: Secondary | ICD-10-CM | POA: Diagnosis not present

## 2017-09-06 DIAGNOSIS — D509 Iron deficiency anemia, unspecified: Secondary | ICD-10-CM | POA: Diagnosis not present

## 2017-09-06 DIAGNOSIS — B9629 Other Escherichia coli [E. coli] as the cause of diseases classified elsewhere: Secondary | ICD-10-CM | POA: Diagnosis not present

## 2017-09-07 DIAGNOSIS — N186 End stage renal disease: Secondary | ICD-10-CM | POA: Diagnosis not present

## 2017-09-07 DIAGNOSIS — N2581 Secondary hyperparathyroidism of renal origin: Secondary | ICD-10-CM | POA: Diagnosis not present

## 2017-09-07 DIAGNOSIS — Z4932 Encounter for adequacy testing for peritoneal dialysis: Secondary | ICD-10-CM | POA: Diagnosis not present

## 2017-09-07 DIAGNOSIS — D509 Iron deficiency anemia, unspecified: Secondary | ICD-10-CM | POA: Diagnosis not present

## 2017-09-07 DIAGNOSIS — E440 Moderate protein-calorie malnutrition: Secondary | ICD-10-CM | POA: Diagnosis not present

## 2017-09-07 DIAGNOSIS — D631 Anemia in chronic kidney disease: Secondary | ICD-10-CM | POA: Diagnosis not present

## 2017-09-08 DIAGNOSIS — Z4932 Encounter for adequacy testing for peritoneal dialysis: Secondary | ICD-10-CM | POA: Diagnosis not present

## 2017-09-08 DIAGNOSIS — E440 Moderate protein-calorie malnutrition: Secondary | ICD-10-CM | POA: Diagnosis not present

## 2017-09-08 DIAGNOSIS — D631 Anemia in chronic kidney disease: Secondary | ICD-10-CM | POA: Diagnosis not present

## 2017-09-08 DIAGNOSIS — D509 Iron deficiency anemia, unspecified: Secondary | ICD-10-CM | POA: Diagnosis not present

## 2017-09-08 DIAGNOSIS — N186 End stage renal disease: Secondary | ICD-10-CM | POA: Diagnosis not present

## 2017-09-08 DIAGNOSIS — N2581 Secondary hyperparathyroidism of renal origin: Secondary | ICD-10-CM | POA: Diagnosis not present

## 2017-09-09 DIAGNOSIS — D509 Iron deficiency anemia, unspecified: Secondary | ICD-10-CM | POA: Diagnosis not present

## 2017-09-09 DIAGNOSIS — N2581 Secondary hyperparathyroidism of renal origin: Secondary | ICD-10-CM | POA: Diagnosis not present

## 2017-09-09 DIAGNOSIS — D631 Anemia in chronic kidney disease: Secondary | ICD-10-CM | POA: Diagnosis not present

## 2017-09-09 DIAGNOSIS — N186 End stage renal disease: Secondary | ICD-10-CM | POA: Diagnosis not present

## 2017-09-09 DIAGNOSIS — E440 Moderate protein-calorie malnutrition: Secondary | ICD-10-CM | POA: Diagnosis not present

## 2017-09-09 DIAGNOSIS — Z4932 Encounter for adequacy testing for peritoneal dialysis: Secondary | ICD-10-CM | POA: Diagnosis not present

## 2017-09-10 DIAGNOSIS — E7849 Other hyperlipidemia: Secondary | ICD-10-CM | POA: Diagnosis not present

## 2017-09-10 DIAGNOSIS — D509 Iron deficiency anemia, unspecified: Secondary | ICD-10-CM | POA: Diagnosis not present

## 2017-09-10 DIAGNOSIS — Z4932 Encounter for adequacy testing for peritoneal dialysis: Secondary | ICD-10-CM | POA: Diagnosis not present

## 2017-09-10 DIAGNOSIS — N2581 Secondary hyperparathyroidism of renal origin: Secondary | ICD-10-CM | POA: Diagnosis not present

## 2017-09-10 DIAGNOSIS — D631 Anemia in chronic kidney disease: Secondary | ICD-10-CM | POA: Diagnosis not present

## 2017-09-10 DIAGNOSIS — R82998 Other abnormal findings in urine: Secondary | ICD-10-CM | POA: Diagnosis not present

## 2017-09-10 DIAGNOSIS — N186 End stage renal disease: Secondary | ICD-10-CM | POA: Diagnosis not present

## 2017-09-10 DIAGNOSIS — E440 Moderate protein-calorie malnutrition: Secondary | ICD-10-CM | POA: Diagnosis not present

## 2017-09-11 DIAGNOSIS — N2581 Secondary hyperparathyroidism of renal origin: Secondary | ICD-10-CM | POA: Diagnosis not present

## 2017-09-11 DIAGNOSIS — B9629 Other Escherichia coli [E. coli] as the cause of diseases classified elsewhere: Secondary | ICD-10-CM | POA: Diagnosis not present

## 2017-09-11 DIAGNOSIS — D631 Anemia in chronic kidney disease: Secondary | ICD-10-CM | POA: Diagnosis not present

## 2017-09-11 DIAGNOSIS — D509 Iron deficiency anemia, unspecified: Secondary | ICD-10-CM | POA: Diagnosis not present

## 2017-09-11 DIAGNOSIS — N186 End stage renal disease: Secondary | ICD-10-CM | POA: Diagnosis not present

## 2017-09-11 DIAGNOSIS — N390 Urinary tract infection, site not specified: Secondary | ICD-10-CM | POA: Diagnosis not present

## 2017-09-11 DIAGNOSIS — Z4932 Encounter for adequacy testing for peritoneal dialysis: Secondary | ICD-10-CM | POA: Diagnosis not present

## 2017-09-11 DIAGNOSIS — E440 Moderate protein-calorie malnutrition: Secondary | ICD-10-CM | POA: Diagnosis not present

## 2017-09-12 DIAGNOSIS — Z4932 Encounter for adequacy testing for peritoneal dialysis: Secondary | ICD-10-CM | POA: Diagnosis not present

## 2017-09-12 DIAGNOSIS — E440 Moderate protein-calorie malnutrition: Secondary | ICD-10-CM | POA: Diagnosis not present

## 2017-09-12 DIAGNOSIS — D509 Iron deficiency anemia, unspecified: Secondary | ICD-10-CM | POA: Diagnosis not present

## 2017-09-12 DIAGNOSIS — N186 End stage renal disease: Secondary | ICD-10-CM | POA: Diagnosis not present

## 2017-09-12 DIAGNOSIS — N2581 Secondary hyperparathyroidism of renal origin: Secondary | ICD-10-CM | POA: Diagnosis not present

## 2017-09-12 DIAGNOSIS — D631 Anemia in chronic kidney disease: Secondary | ICD-10-CM | POA: Diagnosis not present

## 2017-09-13 DIAGNOSIS — Z992 Dependence on renal dialysis: Secondary | ICD-10-CM | POA: Diagnosis not present

## 2017-09-13 DIAGNOSIS — D631 Anemia in chronic kidney disease: Secondary | ICD-10-CM | POA: Diagnosis not present

## 2017-09-13 DIAGNOSIS — Z4932 Encounter for adequacy testing for peritoneal dialysis: Secondary | ICD-10-CM | POA: Diagnosis not present

## 2017-09-13 DIAGNOSIS — B9629 Other Escherichia coli [E. coli] as the cause of diseases classified elsewhere: Secondary | ICD-10-CM | POA: Diagnosis not present

## 2017-09-13 DIAGNOSIS — D509 Iron deficiency anemia, unspecified: Secondary | ICD-10-CM | POA: Diagnosis not present

## 2017-09-13 DIAGNOSIS — N186 End stage renal disease: Secondary | ICD-10-CM | POA: Diagnosis not present

## 2017-09-13 DIAGNOSIS — E440 Moderate protein-calorie malnutrition: Secondary | ICD-10-CM | POA: Diagnosis not present

## 2017-09-13 DIAGNOSIS — N2581 Secondary hyperparathyroidism of renal origin: Secondary | ICD-10-CM | POA: Diagnosis not present

## 2017-09-13 DIAGNOSIS — N390 Urinary tract infection, site not specified: Secondary | ICD-10-CM | POA: Diagnosis not present

## 2017-09-14 DIAGNOSIS — D509 Iron deficiency anemia, unspecified: Secondary | ICD-10-CM | POA: Diagnosis not present

## 2017-09-14 DIAGNOSIS — N2581 Secondary hyperparathyroidism of renal origin: Secondary | ICD-10-CM | POA: Diagnosis not present

## 2017-09-14 DIAGNOSIS — E440 Moderate protein-calorie malnutrition: Secondary | ICD-10-CM | POA: Diagnosis not present

## 2017-09-14 DIAGNOSIS — D631 Anemia in chronic kidney disease: Secondary | ICD-10-CM | POA: Diagnosis not present

## 2017-09-14 DIAGNOSIS — Z4932 Encounter for adequacy testing for peritoneal dialysis: Secondary | ICD-10-CM | POA: Diagnosis not present

## 2017-09-14 DIAGNOSIS — N186 End stage renal disease: Secondary | ICD-10-CM | POA: Diagnosis not present

## 2017-09-15 DIAGNOSIS — Z4932 Encounter for adequacy testing for peritoneal dialysis: Secondary | ICD-10-CM | POA: Diagnosis not present

## 2017-09-15 DIAGNOSIS — N2581 Secondary hyperparathyroidism of renal origin: Secondary | ICD-10-CM | POA: Diagnosis not present

## 2017-09-15 DIAGNOSIS — D631 Anemia in chronic kidney disease: Secondary | ICD-10-CM | POA: Diagnosis not present

## 2017-09-15 DIAGNOSIS — D509 Iron deficiency anemia, unspecified: Secondary | ICD-10-CM | POA: Diagnosis not present

## 2017-09-15 DIAGNOSIS — E440 Moderate protein-calorie malnutrition: Secondary | ICD-10-CM | POA: Diagnosis not present

## 2017-09-15 DIAGNOSIS — N186 End stage renal disease: Secondary | ICD-10-CM | POA: Diagnosis not present

## 2017-09-16 DIAGNOSIS — N2581 Secondary hyperparathyroidism of renal origin: Secondary | ICD-10-CM | POA: Diagnosis not present

## 2017-09-16 DIAGNOSIS — E440 Moderate protein-calorie malnutrition: Secondary | ICD-10-CM | POA: Diagnosis not present

## 2017-09-16 DIAGNOSIS — D509 Iron deficiency anemia, unspecified: Secondary | ICD-10-CM | POA: Diagnosis not present

## 2017-09-16 DIAGNOSIS — N390 Urinary tract infection, site not specified: Secondary | ICD-10-CM | POA: Diagnosis not present

## 2017-09-16 DIAGNOSIS — Z4932 Encounter for adequacy testing for peritoneal dialysis: Secondary | ICD-10-CM | POA: Diagnosis not present

## 2017-09-16 DIAGNOSIS — B9629 Other Escherichia coli [E. coli] as the cause of diseases classified elsewhere: Secondary | ICD-10-CM | POA: Diagnosis not present

## 2017-09-16 DIAGNOSIS — D631 Anemia in chronic kidney disease: Secondary | ICD-10-CM | POA: Diagnosis not present

## 2017-09-16 DIAGNOSIS — N186 End stage renal disease: Secondary | ICD-10-CM | POA: Diagnosis not present

## 2017-09-17 DIAGNOSIS — N2581 Secondary hyperparathyroidism of renal origin: Secondary | ICD-10-CM | POA: Diagnosis not present

## 2017-09-17 DIAGNOSIS — D631 Anemia in chronic kidney disease: Secondary | ICD-10-CM | POA: Diagnosis not present

## 2017-09-17 DIAGNOSIS — N390 Urinary tract infection, site not specified: Secondary | ICD-10-CM | POA: Diagnosis not present

## 2017-09-17 DIAGNOSIS — B9629 Other Escherichia coli [E. coli] as the cause of diseases classified elsewhere: Secondary | ICD-10-CM | POA: Diagnosis not present

## 2017-09-17 DIAGNOSIS — E440 Moderate protein-calorie malnutrition: Secondary | ICD-10-CM | POA: Diagnosis not present

## 2017-09-17 DIAGNOSIS — Z4932 Encounter for adequacy testing for peritoneal dialysis: Secondary | ICD-10-CM | POA: Diagnosis not present

## 2017-09-17 DIAGNOSIS — D509 Iron deficiency anemia, unspecified: Secondary | ICD-10-CM | POA: Diagnosis not present

## 2017-09-17 DIAGNOSIS — N186 End stage renal disease: Secondary | ICD-10-CM | POA: Diagnosis not present

## 2017-09-18 DIAGNOSIS — D631 Anemia in chronic kidney disease: Secondary | ICD-10-CM | POA: Diagnosis not present

## 2017-09-18 DIAGNOSIS — N186 End stage renal disease: Secondary | ICD-10-CM | POA: Diagnosis not present

## 2017-09-18 DIAGNOSIS — Z4932 Encounter for adequacy testing for peritoneal dialysis: Secondary | ICD-10-CM | POA: Diagnosis not present

## 2017-09-18 DIAGNOSIS — N2581 Secondary hyperparathyroidism of renal origin: Secondary | ICD-10-CM | POA: Diagnosis not present

## 2017-09-18 DIAGNOSIS — E440 Moderate protein-calorie malnutrition: Secondary | ICD-10-CM | POA: Diagnosis not present

## 2017-09-18 DIAGNOSIS — B9629 Other Escherichia coli [E. coli] as the cause of diseases classified elsewhere: Secondary | ICD-10-CM | POA: Diagnosis not present

## 2017-09-18 DIAGNOSIS — N390 Urinary tract infection, site not specified: Secondary | ICD-10-CM | POA: Diagnosis not present

## 2017-09-18 DIAGNOSIS — D509 Iron deficiency anemia, unspecified: Secondary | ICD-10-CM | POA: Diagnosis not present

## 2017-09-19 DIAGNOSIS — E440 Moderate protein-calorie malnutrition: Secondary | ICD-10-CM | POA: Diagnosis not present

## 2017-09-19 DIAGNOSIS — Z4932 Encounter for adequacy testing for peritoneal dialysis: Secondary | ICD-10-CM | POA: Diagnosis not present

## 2017-09-19 DIAGNOSIS — D509 Iron deficiency anemia, unspecified: Secondary | ICD-10-CM | POA: Diagnosis not present

## 2017-09-19 DIAGNOSIS — N2581 Secondary hyperparathyroidism of renal origin: Secondary | ICD-10-CM | POA: Diagnosis not present

## 2017-09-19 DIAGNOSIS — B9629 Other Escherichia coli [E. coli] as the cause of diseases classified elsewhere: Secondary | ICD-10-CM | POA: Diagnosis not present

## 2017-09-19 DIAGNOSIS — N390 Urinary tract infection, site not specified: Secondary | ICD-10-CM | POA: Diagnosis not present

## 2017-09-19 DIAGNOSIS — N186 End stage renal disease: Secondary | ICD-10-CM | POA: Diagnosis not present

## 2017-09-19 DIAGNOSIS — D631 Anemia in chronic kidney disease: Secondary | ICD-10-CM | POA: Diagnosis not present

## 2017-09-20 DIAGNOSIS — D509 Iron deficiency anemia, unspecified: Secondary | ICD-10-CM | POA: Diagnosis not present

## 2017-09-20 DIAGNOSIS — Z4932 Encounter for adequacy testing for peritoneal dialysis: Secondary | ICD-10-CM | POA: Diagnosis not present

## 2017-09-20 DIAGNOSIS — D631 Anemia in chronic kidney disease: Secondary | ICD-10-CM | POA: Diagnosis not present

## 2017-09-20 DIAGNOSIS — N186 End stage renal disease: Secondary | ICD-10-CM | POA: Diagnosis not present

## 2017-09-20 DIAGNOSIS — N2581 Secondary hyperparathyroidism of renal origin: Secondary | ICD-10-CM | POA: Diagnosis not present

## 2017-09-20 DIAGNOSIS — E440 Moderate protein-calorie malnutrition: Secondary | ICD-10-CM | POA: Diagnosis not present

## 2017-09-21 DIAGNOSIS — D509 Iron deficiency anemia, unspecified: Secondary | ICD-10-CM | POA: Diagnosis not present

## 2017-09-21 DIAGNOSIS — N186 End stage renal disease: Secondary | ICD-10-CM | POA: Diagnosis not present

## 2017-09-21 DIAGNOSIS — N2581 Secondary hyperparathyroidism of renal origin: Secondary | ICD-10-CM | POA: Diagnosis not present

## 2017-09-21 DIAGNOSIS — E440 Moderate protein-calorie malnutrition: Secondary | ICD-10-CM | POA: Diagnosis not present

## 2017-09-21 DIAGNOSIS — D631 Anemia in chronic kidney disease: Secondary | ICD-10-CM | POA: Diagnosis not present

## 2017-09-21 DIAGNOSIS — Z4932 Encounter for adequacy testing for peritoneal dialysis: Secondary | ICD-10-CM | POA: Diagnosis not present

## 2017-09-22 DIAGNOSIS — E440 Moderate protein-calorie malnutrition: Secondary | ICD-10-CM | POA: Diagnosis not present

## 2017-09-22 DIAGNOSIS — D509 Iron deficiency anemia, unspecified: Secondary | ICD-10-CM | POA: Diagnosis not present

## 2017-09-22 DIAGNOSIS — Z4932 Encounter for adequacy testing for peritoneal dialysis: Secondary | ICD-10-CM | POA: Diagnosis not present

## 2017-09-22 DIAGNOSIS — D631 Anemia in chronic kidney disease: Secondary | ICD-10-CM | POA: Diagnosis not present

## 2017-09-22 DIAGNOSIS — N186 End stage renal disease: Secondary | ICD-10-CM | POA: Diagnosis not present

## 2017-09-22 DIAGNOSIS — N2581 Secondary hyperparathyroidism of renal origin: Secondary | ICD-10-CM | POA: Diagnosis not present

## 2017-09-23 DIAGNOSIS — N186 End stage renal disease: Secondary | ICD-10-CM | POA: Diagnosis not present

## 2017-09-23 DIAGNOSIS — E440 Moderate protein-calorie malnutrition: Secondary | ICD-10-CM | POA: Diagnosis not present

## 2017-09-23 DIAGNOSIS — Z4932 Encounter for adequacy testing for peritoneal dialysis: Secondary | ICD-10-CM | POA: Diagnosis not present

## 2017-09-23 DIAGNOSIS — N2581 Secondary hyperparathyroidism of renal origin: Secondary | ICD-10-CM | POA: Diagnosis not present

## 2017-09-23 DIAGNOSIS — B9629 Other Escherichia coli [E. coli] as the cause of diseases classified elsewhere: Secondary | ICD-10-CM | POA: Diagnosis not present

## 2017-09-23 DIAGNOSIS — D631 Anemia in chronic kidney disease: Secondary | ICD-10-CM | POA: Diagnosis not present

## 2017-09-23 DIAGNOSIS — N390 Urinary tract infection, site not specified: Secondary | ICD-10-CM | POA: Diagnosis not present

## 2017-09-23 DIAGNOSIS — D509 Iron deficiency anemia, unspecified: Secondary | ICD-10-CM | POA: Diagnosis not present

## 2017-09-24 DIAGNOSIS — E440 Moderate protein-calorie malnutrition: Secondary | ICD-10-CM | POA: Diagnosis not present

## 2017-09-24 DIAGNOSIS — D509 Iron deficiency anemia, unspecified: Secondary | ICD-10-CM | POA: Diagnosis not present

## 2017-09-24 DIAGNOSIS — D631 Anemia in chronic kidney disease: Secondary | ICD-10-CM | POA: Diagnosis not present

## 2017-09-24 DIAGNOSIS — Z4932 Encounter for adequacy testing for peritoneal dialysis: Secondary | ICD-10-CM | POA: Diagnosis not present

## 2017-09-24 DIAGNOSIS — N2581 Secondary hyperparathyroidism of renal origin: Secondary | ICD-10-CM | POA: Diagnosis not present

## 2017-09-24 DIAGNOSIS — N186 End stage renal disease: Secondary | ICD-10-CM | POA: Diagnosis not present

## 2017-09-25 DIAGNOSIS — B9629 Other Escherichia coli [E. coli] as the cause of diseases classified elsewhere: Secondary | ICD-10-CM | POA: Diagnosis not present

## 2017-09-25 DIAGNOSIS — N390 Urinary tract infection, site not specified: Secondary | ICD-10-CM | POA: Diagnosis not present

## 2017-09-25 DIAGNOSIS — D631 Anemia in chronic kidney disease: Secondary | ICD-10-CM | POA: Diagnosis not present

## 2017-09-25 DIAGNOSIS — N186 End stage renal disease: Secondary | ICD-10-CM | POA: Diagnosis not present

## 2017-09-25 DIAGNOSIS — E440 Moderate protein-calorie malnutrition: Secondary | ICD-10-CM | POA: Diagnosis not present

## 2017-09-25 DIAGNOSIS — N2581 Secondary hyperparathyroidism of renal origin: Secondary | ICD-10-CM | POA: Diagnosis not present

## 2017-09-25 DIAGNOSIS — D509 Iron deficiency anemia, unspecified: Secondary | ICD-10-CM | POA: Diagnosis not present

## 2017-09-25 DIAGNOSIS — Z4932 Encounter for adequacy testing for peritoneal dialysis: Secondary | ICD-10-CM | POA: Diagnosis not present

## 2017-09-26 DIAGNOSIS — Z4932 Encounter for adequacy testing for peritoneal dialysis: Secondary | ICD-10-CM | POA: Diagnosis not present

## 2017-09-26 DIAGNOSIS — D631 Anemia in chronic kidney disease: Secondary | ICD-10-CM | POA: Diagnosis not present

## 2017-09-26 DIAGNOSIS — N2581 Secondary hyperparathyroidism of renal origin: Secondary | ICD-10-CM | POA: Diagnosis not present

## 2017-09-26 DIAGNOSIS — E440 Moderate protein-calorie malnutrition: Secondary | ICD-10-CM | POA: Diagnosis not present

## 2017-09-26 DIAGNOSIS — D509 Iron deficiency anemia, unspecified: Secondary | ICD-10-CM | POA: Diagnosis not present

## 2017-09-26 DIAGNOSIS — N186 End stage renal disease: Secondary | ICD-10-CM | POA: Diagnosis not present

## 2017-09-27 DIAGNOSIS — Z4932 Encounter for adequacy testing for peritoneal dialysis: Secondary | ICD-10-CM | POA: Diagnosis not present

## 2017-09-27 DIAGNOSIS — D631 Anemia in chronic kidney disease: Secondary | ICD-10-CM | POA: Diagnosis not present

## 2017-09-27 DIAGNOSIS — D509 Iron deficiency anemia, unspecified: Secondary | ICD-10-CM | POA: Diagnosis not present

## 2017-09-27 DIAGNOSIS — E440 Moderate protein-calorie malnutrition: Secondary | ICD-10-CM | POA: Diagnosis not present

## 2017-09-27 DIAGNOSIS — N2581 Secondary hyperparathyroidism of renal origin: Secondary | ICD-10-CM | POA: Diagnosis not present

## 2017-09-27 DIAGNOSIS — N186 End stage renal disease: Secondary | ICD-10-CM | POA: Diagnosis not present

## 2017-09-28 DIAGNOSIS — N2581 Secondary hyperparathyroidism of renal origin: Secondary | ICD-10-CM | POA: Diagnosis not present

## 2017-09-28 DIAGNOSIS — E440 Moderate protein-calorie malnutrition: Secondary | ICD-10-CM | POA: Diagnosis not present

## 2017-09-28 DIAGNOSIS — N186 End stage renal disease: Secondary | ICD-10-CM | POA: Diagnosis not present

## 2017-09-28 DIAGNOSIS — Z4932 Encounter for adequacy testing for peritoneal dialysis: Secondary | ICD-10-CM | POA: Diagnosis not present

## 2017-09-28 DIAGNOSIS — D631 Anemia in chronic kidney disease: Secondary | ICD-10-CM | POA: Diagnosis not present

## 2017-09-28 DIAGNOSIS — D509 Iron deficiency anemia, unspecified: Secondary | ICD-10-CM | POA: Diagnosis not present

## 2017-09-29 DIAGNOSIS — E440 Moderate protein-calorie malnutrition: Secondary | ICD-10-CM | POA: Diagnosis not present

## 2017-09-29 DIAGNOSIS — N2581 Secondary hyperparathyroidism of renal origin: Secondary | ICD-10-CM | POA: Diagnosis not present

## 2017-09-29 DIAGNOSIS — Z79899 Other long term (current) drug therapy: Secondary | ICD-10-CM | POA: Diagnosis not present

## 2017-09-29 DIAGNOSIS — N186 End stage renal disease: Secondary | ICD-10-CM | POA: Diagnosis not present

## 2017-09-29 DIAGNOSIS — Z23 Encounter for immunization: Secondary | ICD-10-CM | POA: Diagnosis not present

## 2017-09-29 DIAGNOSIS — K769 Liver disease, unspecified: Secondary | ICD-10-CM | POA: Diagnosis not present

## 2017-09-29 DIAGNOSIS — D631 Anemia in chronic kidney disease: Secondary | ICD-10-CM | POA: Diagnosis not present

## 2017-09-29 DIAGNOSIS — Z4932 Encounter for adequacy testing for peritoneal dialysis: Secondary | ICD-10-CM | POA: Diagnosis not present

## 2017-09-29 DIAGNOSIS — D509 Iron deficiency anemia, unspecified: Secondary | ICD-10-CM | POA: Diagnosis not present

## 2017-09-30 DIAGNOSIS — N2581 Secondary hyperparathyroidism of renal origin: Secondary | ICD-10-CM | POA: Diagnosis not present

## 2017-09-30 DIAGNOSIS — D509 Iron deficiency anemia, unspecified: Secondary | ICD-10-CM | POA: Diagnosis not present

## 2017-09-30 DIAGNOSIS — Z4932 Encounter for adequacy testing for peritoneal dialysis: Secondary | ICD-10-CM | POA: Diagnosis not present

## 2017-09-30 DIAGNOSIS — N186 End stage renal disease: Secondary | ICD-10-CM | POA: Diagnosis not present

## 2017-09-30 DIAGNOSIS — K769 Liver disease, unspecified: Secondary | ICD-10-CM | POA: Diagnosis not present

## 2017-09-30 DIAGNOSIS — D631 Anemia in chronic kidney disease: Secondary | ICD-10-CM | POA: Diagnosis not present

## 2017-10-01 DIAGNOSIS — N186 End stage renal disease: Secondary | ICD-10-CM | POA: Diagnosis not present

## 2017-10-01 DIAGNOSIS — D509 Iron deficiency anemia, unspecified: Secondary | ICD-10-CM | POA: Diagnosis not present

## 2017-10-01 DIAGNOSIS — D631 Anemia in chronic kidney disease: Secondary | ICD-10-CM | POA: Diagnosis not present

## 2017-10-01 DIAGNOSIS — N2581 Secondary hyperparathyroidism of renal origin: Secondary | ICD-10-CM | POA: Diagnosis not present

## 2017-10-01 DIAGNOSIS — B9629 Other Escherichia coli [E. coli] as the cause of diseases classified elsewhere: Secondary | ICD-10-CM | POA: Diagnosis not present

## 2017-10-01 DIAGNOSIS — Z4932 Encounter for adequacy testing for peritoneal dialysis: Secondary | ICD-10-CM | POA: Diagnosis not present

## 2017-10-01 DIAGNOSIS — N390 Urinary tract infection, site not specified: Secondary | ICD-10-CM | POA: Diagnosis not present

## 2017-10-01 DIAGNOSIS — K769 Liver disease, unspecified: Secondary | ICD-10-CM | POA: Diagnosis not present

## 2017-10-02 DIAGNOSIS — K769 Liver disease, unspecified: Secondary | ICD-10-CM | POA: Diagnosis not present

## 2017-10-02 DIAGNOSIS — D509 Iron deficiency anemia, unspecified: Secondary | ICD-10-CM | POA: Diagnosis not present

## 2017-10-02 DIAGNOSIS — N186 End stage renal disease: Secondary | ICD-10-CM | POA: Diagnosis not present

## 2017-10-02 DIAGNOSIS — D631 Anemia in chronic kidney disease: Secondary | ICD-10-CM | POA: Diagnosis not present

## 2017-10-02 DIAGNOSIS — Z4932 Encounter for adequacy testing for peritoneal dialysis: Secondary | ICD-10-CM | POA: Diagnosis not present

## 2017-10-02 DIAGNOSIS — N2581 Secondary hyperparathyroidism of renal origin: Secondary | ICD-10-CM | POA: Diagnosis not present

## 2017-10-03 DIAGNOSIS — N2581 Secondary hyperparathyroidism of renal origin: Secondary | ICD-10-CM | POA: Diagnosis not present

## 2017-10-03 DIAGNOSIS — N186 End stage renal disease: Secondary | ICD-10-CM | POA: Diagnosis not present

## 2017-10-03 DIAGNOSIS — Z4932 Encounter for adequacy testing for peritoneal dialysis: Secondary | ICD-10-CM | POA: Diagnosis not present

## 2017-10-03 DIAGNOSIS — K769 Liver disease, unspecified: Secondary | ICD-10-CM | POA: Diagnosis not present

## 2017-10-03 DIAGNOSIS — D509 Iron deficiency anemia, unspecified: Secondary | ICD-10-CM | POA: Diagnosis not present

## 2017-10-03 DIAGNOSIS — D631 Anemia in chronic kidney disease: Secondary | ICD-10-CM | POA: Diagnosis not present

## 2017-10-04 DIAGNOSIS — N2581 Secondary hyperparathyroidism of renal origin: Secondary | ICD-10-CM | POA: Diagnosis not present

## 2017-10-04 DIAGNOSIS — D631 Anemia in chronic kidney disease: Secondary | ICD-10-CM | POA: Diagnosis not present

## 2017-10-04 DIAGNOSIS — Z4932 Encounter for adequacy testing for peritoneal dialysis: Secondary | ICD-10-CM | POA: Diagnosis not present

## 2017-10-04 DIAGNOSIS — D509 Iron deficiency anemia, unspecified: Secondary | ICD-10-CM | POA: Diagnosis not present

## 2017-10-04 DIAGNOSIS — K769 Liver disease, unspecified: Secondary | ICD-10-CM | POA: Diagnosis not present

## 2017-10-04 DIAGNOSIS — N186 End stage renal disease: Secondary | ICD-10-CM | POA: Diagnosis not present

## 2017-10-05 DIAGNOSIS — N2581 Secondary hyperparathyroidism of renal origin: Secondary | ICD-10-CM | POA: Diagnosis not present

## 2017-10-05 DIAGNOSIS — K769 Liver disease, unspecified: Secondary | ICD-10-CM | POA: Diagnosis not present

## 2017-10-05 DIAGNOSIS — D631 Anemia in chronic kidney disease: Secondary | ICD-10-CM | POA: Diagnosis not present

## 2017-10-05 DIAGNOSIS — N186 End stage renal disease: Secondary | ICD-10-CM | POA: Diagnosis not present

## 2017-10-05 DIAGNOSIS — D509 Iron deficiency anemia, unspecified: Secondary | ICD-10-CM | POA: Diagnosis not present

## 2017-10-05 DIAGNOSIS — Z4932 Encounter for adequacy testing for peritoneal dialysis: Secondary | ICD-10-CM | POA: Diagnosis not present

## 2017-10-06 DIAGNOSIS — N2581 Secondary hyperparathyroidism of renal origin: Secondary | ICD-10-CM | POA: Diagnosis not present

## 2017-10-06 DIAGNOSIS — Z4932 Encounter for adequacy testing for peritoneal dialysis: Secondary | ICD-10-CM | POA: Diagnosis not present

## 2017-10-06 DIAGNOSIS — K769 Liver disease, unspecified: Secondary | ICD-10-CM | POA: Diagnosis not present

## 2017-10-06 DIAGNOSIS — D509 Iron deficiency anemia, unspecified: Secondary | ICD-10-CM | POA: Diagnosis not present

## 2017-10-06 DIAGNOSIS — D631 Anemia in chronic kidney disease: Secondary | ICD-10-CM | POA: Diagnosis not present

## 2017-10-06 DIAGNOSIS — N186 End stage renal disease: Secondary | ICD-10-CM | POA: Diagnosis not present

## 2017-10-07 DIAGNOSIS — K769 Liver disease, unspecified: Secondary | ICD-10-CM | POA: Diagnosis not present

## 2017-10-07 DIAGNOSIS — N2581 Secondary hyperparathyroidism of renal origin: Secondary | ICD-10-CM | POA: Diagnosis not present

## 2017-10-07 DIAGNOSIS — N186 End stage renal disease: Secondary | ICD-10-CM | POA: Diagnosis not present

## 2017-10-07 DIAGNOSIS — D509 Iron deficiency anemia, unspecified: Secondary | ICD-10-CM | POA: Diagnosis not present

## 2017-10-07 DIAGNOSIS — Z4932 Encounter for adequacy testing for peritoneal dialysis: Secondary | ICD-10-CM | POA: Diagnosis not present

## 2017-10-07 DIAGNOSIS — D631 Anemia in chronic kidney disease: Secondary | ICD-10-CM | POA: Diagnosis not present

## 2017-10-08 DIAGNOSIS — Z4932 Encounter for adequacy testing for peritoneal dialysis: Secondary | ICD-10-CM | POA: Diagnosis not present

## 2017-10-08 DIAGNOSIS — K769 Liver disease, unspecified: Secondary | ICD-10-CM | POA: Diagnosis not present

## 2017-10-08 DIAGNOSIS — N2581 Secondary hyperparathyroidism of renal origin: Secondary | ICD-10-CM | POA: Diagnosis not present

## 2017-10-08 DIAGNOSIS — D509 Iron deficiency anemia, unspecified: Secondary | ICD-10-CM | POA: Diagnosis not present

## 2017-10-08 DIAGNOSIS — N186 End stage renal disease: Secondary | ICD-10-CM | POA: Diagnosis not present

## 2017-10-08 DIAGNOSIS — D631 Anemia in chronic kidney disease: Secondary | ICD-10-CM | POA: Diagnosis not present

## 2017-10-09 DIAGNOSIS — N186 End stage renal disease: Secondary | ICD-10-CM | POA: Diagnosis not present

## 2017-10-09 DIAGNOSIS — R82998 Other abnormal findings in urine: Secondary | ICD-10-CM | POA: Diagnosis not present

## 2017-10-09 DIAGNOSIS — K769 Liver disease, unspecified: Secondary | ICD-10-CM | POA: Diagnosis not present

## 2017-10-09 DIAGNOSIS — N2581 Secondary hyperparathyroidism of renal origin: Secondary | ICD-10-CM | POA: Diagnosis not present

## 2017-10-09 DIAGNOSIS — D509 Iron deficiency anemia, unspecified: Secondary | ICD-10-CM | POA: Diagnosis not present

## 2017-10-09 DIAGNOSIS — Z4932 Encounter for adequacy testing for peritoneal dialysis: Secondary | ICD-10-CM | POA: Diagnosis not present

## 2017-10-09 DIAGNOSIS — D631 Anemia in chronic kidney disease: Secondary | ICD-10-CM | POA: Diagnosis not present

## 2017-10-10 DIAGNOSIS — N2581 Secondary hyperparathyroidism of renal origin: Secondary | ICD-10-CM | POA: Diagnosis not present

## 2017-10-10 DIAGNOSIS — D509 Iron deficiency anemia, unspecified: Secondary | ICD-10-CM | POA: Diagnosis not present

## 2017-10-10 DIAGNOSIS — D631 Anemia in chronic kidney disease: Secondary | ICD-10-CM | POA: Diagnosis not present

## 2017-10-10 DIAGNOSIS — Z4932 Encounter for adequacy testing for peritoneal dialysis: Secondary | ICD-10-CM | POA: Diagnosis not present

## 2017-10-10 DIAGNOSIS — N186 End stage renal disease: Secondary | ICD-10-CM | POA: Diagnosis not present

## 2017-10-10 DIAGNOSIS — K769 Liver disease, unspecified: Secondary | ICD-10-CM | POA: Diagnosis not present

## 2017-10-11 DIAGNOSIS — D509 Iron deficiency anemia, unspecified: Secondary | ICD-10-CM | POA: Diagnosis not present

## 2017-10-11 DIAGNOSIS — D631 Anemia in chronic kidney disease: Secondary | ICD-10-CM | POA: Diagnosis not present

## 2017-10-11 DIAGNOSIS — N2581 Secondary hyperparathyroidism of renal origin: Secondary | ICD-10-CM | POA: Diagnosis not present

## 2017-10-11 DIAGNOSIS — K769 Liver disease, unspecified: Secondary | ICD-10-CM | POA: Diagnosis not present

## 2017-10-11 DIAGNOSIS — N186 End stage renal disease: Secondary | ICD-10-CM | POA: Diagnosis not present

## 2017-10-11 DIAGNOSIS — Z4932 Encounter for adequacy testing for peritoneal dialysis: Secondary | ICD-10-CM | POA: Diagnosis not present

## 2017-10-12 DIAGNOSIS — D631 Anemia in chronic kidney disease: Secondary | ICD-10-CM | POA: Diagnosis not present

## 2017-10-12 DIAGNOSIS — N186 End stage renal disease: Secondary | ICD-10-CM | POA: Diagnosis not present

## 2017-10-12 DIAGNOSIS — N2581 Secondary hyperparathyroidism of renal origin: Secondary | ICD-10-CM | POA: Diagnosis not present

## 2017-10-12 DIAGNOSIS — Z4932 Encounter for adequacy testing for peritoneal dialysis: Secondary | ICD-10-CM | POA: Diagnosis not present

## 2017-10-12 DIAGNOSIS — K769 Liver disease, unspecified: Secondary | ICD-10-CM | POA: Diagnosis not present

## 2017-10-12 DIAGNOSIS — D509 Iron deficiency anemia, unspecified: Secondary | ICD-10-CM | POA: Diagnosis not present

## 2017-10-13 DIAGNOSIS — Z4932 Encounter for adequacy testing for peritoneal dialysis: Secondary | ICD-10-CM | POA: Diagnosis not present

## 2017-10-13 DIAGNOSIS — N2581 Secondary hyperparathyroidism of renal origin: Secondary | ICD-10-CM | POA: Diagnosis not present

## 2017-10-13 DIAGNOSIS — K769 Liver disease, unspecified: Secondary | ICD-10-CM | POA: Diagnosis not present

## 2017-10-13 DIAGNOSIS — D631 Anemia in chronic kidney disease: Secondary | ICD-10-CM | POA: Diagnosis not present

## 2017-10-13 DIAGNOSIS — D509 Iron deficiency anemia, unspecified: Secondary | ICD-10-CM | POA: Diagnosis not present

## 2017-10-13 DIAGNOSIS — N186 End stage renal disease: Secondary | ICD-10-CM | POA: Diagnosis not present

## 2017-10-14 DIAGNOSIS — D631 Anemia in chronic kidney disease: Secondary | ICD-10-CM | POA: Diagnosis not present

## 2017-10-14 DIAGNOSIS — K769 Liver disease, unspecified: Secondary | ICD-10-CM | POA: Diagnosis not present

## 2017-10-14 DIAGNOSIS — N2581 Secondary hyperparathyroidism of renal origin: Secondary | ICD-10-CM | POA: Diagnosis not present

## 2017-10-14 DIAGNOSIS — N186 End stage renal disease: Secondary | ICD-10-CM | POA: Diagnosis not present

## 2017-10-14 DIAGNOSIS — Z4932 Encounter for adequacy testing for peritoneal dialysis: Secondary | ICD-10-CM | POA: Diagnosis not present

## 2017-10-14 DIAGNOSIS — D509 Iron deficiency anemia, unspecified: Secondary | ICD-10-CM | POA: Diagnosis not present

## 2017-10-15 DIAGNOSIS — N2581 Secondary hyperparathyroidism of renal origin: Secondary | ICD-10-CM | POA: Diagnosis not present

## 2017-10-15 DIAGNOSIS — N186 End stage renal disease: Secondary | ICD-10-CM | POA: Diagnosis not present

## 2017-10-15 DIAGNOSIS — Z4932 Encounter for adequacy testing for peritoneal dialysis: Secondary | ICD-10-CM | POA: Diagnosis not present

## 2017-10-15 DIAGNOSIS — D631 Anemia in chronic kidney disease: Secondary | ICD-10-CM | POA: Diagnosis not present

## 2017-10-15 DIAGNOSIS — K769 Liver disease, unspecified: Secondary | ICD-10-CM | POA: Diagnosis not present

## 2017-10-15 DIAGNOSIS — D509 Iron deficiency anemia, unspecified: Secondary | ICD-10-CM | POA: Diagnosis not present

## 2017-10-16 DIAGNOSIS — Z4932 Encounter for adequacy testing for peritoneal dialysis: Secondary | ICD-10-CM | POA: Diagnosis not present

## 2017-10-16 DIAGNOSIS — K769 Liver disease, unspecified: Secondary | ICD-10-CM | POA: Diagnosis not present

## 2017-10-16 DIAGNOSIS — N186 End stage renal disease: Secondary | ICD-10-CM | POA: Diagnosis not present

## 2017-10-16 DIAGNOSIS — D631 Anemia in chronic kidney disease: Secondary | ICD-10-CM | POA: Diagnosis not present

## 2017-10-16 DIAGNOSIS — D509 Iron deficiency anemia, unspecified: Secondary | ICD-10-CM | POA: Diagnosis not present

## 2017-10-16 DIAGNOSIS — N2581 Secondary hyperparathyroidism of renal origin: Secondary | ICD-10-CM | POA: Diagnosis not present

## 2017-10-17 DIAGNOSIS — N2581 Secondary hyperparathyroidism of renal origin: Secondary | ICD-10-CM | POA: Diagnosis not present

## 2017-10-17 DIAGNOSIS — N186 End stage renal disease: Secondary | ICD-10-CM | POA: Diagnosis not present

## 2017-10-17 DIAGNOSIS — D509 Iron deficiency anemia, unspecified: Secondary | ICD-10-CM | POA: Diagnosis not present

## 2017-10-17 DIAGNOSIS — D631 Anemia in chronic kidney disease: Secondary | ICD-10-CM | POA: Diagnosis not present

## 2017-10-17 DIAGNOSIS — Z4932 Encounter for adequacy testing for peritoneal dialysis: Secondary | ICD-10-CM | POA: Diagnosis not present

## 2017-10-17 DIAGNOSIS — K769 Liver disease, unspecified: Secondary | ICD-10-CM | POA: Diagnosis not present

## 2017-10-18 DIAGNOSIS — N186 End stage renal disease: Secondary | ICD-10-CM | POA: Diagnosis not present

## 2017-10-18 DIAGNOSIS — Z4932 Encounter for adequacy testing for peritoneal dialysis: Secondary | ICD-10-CM | POA: Diagnosis not present

## 2017-10-18 DIAGNOSIS — D509 Iron deficiency anemia, unspecified: Secondary | ICD-10-CM | POA: Diagnosis not present

## 2017-10-18 DIAGNOSIS — D631 Anemia in chronic kidney disease: Secondary | ICD-10-CM | POA: Diagnosis not present

## 2017-10-18 DIAGNOSIS — N2581 Secondary hyperparathyroidism of renal origin: Secondary | ICD-10-CM | POA: Diagnosis not present

## 2017-10-18 DIAGNOSIS — K769 Liver disease, unspecified: Secondary | ICD-10-CM | POA: Diagnosis not present

## 2017-10-19 DIAGNOSIS — K769 Liver disease, unspecified: Secondary | ICD-10-CM | POA: Diagnosis not present

## 2017-10-19 DIAGNOSIS — D631 Anemia in chronic kidney disease: Secondary | ICD-10-CM | POA: Diagnosis not present

## 2017-10-19 DIAGNOSIS — Z4932 Encounter for adequacy testing for peritoneal dialysis: Secondary | ICD-10-CM | POA: Diagnosis not present

## 2017-10-19 DIAGNOSIS — N186 End stage renal disease: Secondary | ICD-10-CM | POA: Diagnosis not present

## 2017-10-19 DIAGNOSIS — N2581 Secondary hyperparathyroidism of renal origin: Secondary | ICD-10-CM | POA: Diagnosis not present

## 2017-10-19 DIAGNOSIS — D509 Iron deficiency anemia, unspecified: Secondary | ICD-10-CM | POA: Diagnosis not present

## 2017-10-20 DIAGNOSIS — D509 Iron deficiency anemia, unspecified: Secondary | ICD-10-CM | POA: Diagnosis not present

## 2017-10-20 DIAGNOSIS — Z4932 Encounter for adequacy testing for peritoneal dialysis: Secondary | ICD-10-CM | POA: Diagnosis not present

## 2017-10-20 DIAGNOSIS — N2581 Secondary hyperparathyroidism of renal origin: Secondary | ICD-10-CM | POA: Diagnosis not present

## 2017-10-20 DIAGNOSIS — K769 Liver disease, unspecified: Secondary | ICD-10-CM | POA: Diagnosis not present

## 2017-10-20 DIAGNOSIS — D631 Anemia in chronic kidney disease: Secondary | ICD-10-CM | POA: Diagnosis not present

## 2017-10-20 DIAGNOSIS — N186 End stage renal disease: Secondary | ICD-10-CM | POA: Diagnosis not present

## 2017-10-21 DIAGNOSIS — Z4932 Encounter for adequacy testing for peritoneal dialysis: Secondary | ICD-10-CM | POA: Diagnosis not present

## 2017-10-21 DIAGNOSIS — D631 Anemia in chronic kidney disease: Secondary | ICD-10-CM | POA: Diagnosis not present

## 2017-10-21 DIAGNOSIS — N2581 Secondary hyperparathyroidism of renal origin: Secondary | ICD-10-CM | POA: Diagnosis not present

## 2017-10-21 DIAGNOSIS — N186 End stage renal disease: Secondary | ICD-10-CM | POA: Diagnosis not present

## 2017-10-21 DIAGNOSIS — K769 Liver disease, unspecified: Secondary | ICD-10-CM | POA: Diagnosis not present

## 2017-10-21 DIAGNOSIS — D509 Iron deficiency anemia, unspecified: Secondary | ICD-10-CM | POA: Diagnosis not present

## 2017-10-22 DIAGNOSIS — K769 Liver disease, unspecified: Secondary | ICD-10-CM | POA: Diagnosis not present

## 2017-10-22 DIAGNOSIS — N2581 Secondary hyperparathyroidism of renal origin: Secondary | ICD-10-CM | POA: Diagnosis not present

## 2017-10-22 DIAGNOSIS — N186 End stage renal disease: Secondary | ICD-10-CM | POA: Diagnosis not present

## 2017-10-22 DIAGNOSIS — Z4932 Encounter for adequacy testing for peritoneal dialysis: Secondary | ICD-10-CM | POA: Diagnosis not present

## 2017-10-22 DIAGNOSIS — D509 Iron deficiency anemia, unspecified: Secondary | ICD-10-CM | POA: Diagnosis not present

## 2017-10-22 DIAGNOSIS — D631 Anemia in chronic kidney disease: Secondary | ICD-10-CM | POA: Diagnosis not present

## 2017-10-23 DIAGNOSIS — D509 Iron deficiency anemia, unspecified: Secondary | ICD-10-CM | POA: Diagnosis not present

## 2017-10-23 DIAGNOSIS — K769 Liver disease, unspecified: Secondary | ICD-10-CM | POA: Diagnosis not present

## 2017-10-23 DIAGNOSIS — N2581 Secondary hyperparathyroidism of renal origin: Secondary | ICD-10-CM | POA: Diagnosis not present

## 2017-10-23 DIAGNOSIS — N186 End stage renal disease: Secondary | ICD-10-CM | POA: Diagnosis not present

## 2017-10-23 DIAGNOSIS — D631 Anemia in chronic kidney disease: Secondary | ICD-10-CM | POA: Diagnosis not present

## 2017-10-23 DIAGNOSIS — Z4932 Encounter for adequacy testing for peritoneal dialysis: Secondary | ICD-10-CM | POA: Diagnosis not present

## 2017-10-24 DIAGNOSIS — N186 End stage renal disease: Secondary | ICD-10-CM | POA: Diagnosis not present

## 2017-10-24 DIAGNOSIS — D509 Iron deficiency anemia, unspecified: Secondary | ICD-10-CM | POA: Diagnosis not present

## 2017-10-24 DIAGNOSIS — K769 Liver disease, unspecified: Secondary | ICD-10-CM | POA: Diagnosis not present

## 2017-10-24 DIAGNOSIS — D631 Anemia in chronic kidney disease: Secondary | ICD-10-CM | POA: Diagnosis not present

## 2017-10-24 DIAGNOSIS — N2581 Secondary hyperparathyroidism of renal origin: Secondary | ICD-10-CM | POA: Diagnosis not present

## 2017-10-24 DIAGNOSIS — Z4932 Encounter for adequacy testing for peritoneal dialysis: Secondary | ICD-10-CM | POA: Diagnosis not present

## 2017-10-25 DIAGNOSIS — K769 Liver disease, unspecified: Secondary | ICD-10-CM | POA: Diagnosis not present

## 2017-10-25 DIAGNOSIS — N186 End stage renal disease: Secondary | ICD-10-CM | POA: Diagnosis not present

## 2017-10-25 DIAGNOSIS — D631 Anemia in chronic kidney disease: Secondary | ICD-10-CM | POA: Diagnosis not present

## 2017-10-25 DIAGNOSIS — N2581 Secondary hyperparathyroidism of renal origin: Secondary | ICD-10-CM | POA: Diagnosis not present

## 2017-10-25 DIAGNOSIS — D509 Iron deficiency anemia, unspecified: Secondary | ICD-10-CM | POA: Diagnosis not present

## 2017-10-25 DIAGNOSIS — Z4932 Encounter for adequacy testing for peritoneal dialysis: Secondary | ICD-10-CM | POA: Diagnosis not present

## 2017-10-26 DIAGNOSIS — K769 Liver disease, unspecified: Secondary | ICD-10-CM | POA: Diagnosis not present

## 2017-10-26 DIAGNOSIS — Z4932 Encounter for adequacy testing for peritoneal dialysis: Secondary | ICD-10-CM | POA: Diagnosis not present

## 2017-10-26 DIAGNOSIS — D631 Anemia in chronic kidney disease: Secondary | ICD-10-CM | POA: Diagnosis not present

## 2017-10-26 DIAGNOSIS — N186 End stage renal disease: Secondary | ICD-10-CM | POA: Diagnosis not present

## 2017-10-26 DIAGNOSIS — D509 Iron deficiency anemia, unspecified: Secondary | ICD-10-CM | POA: Diagnosis not present

## 2017-10-26 DIAGNOSIS — N2581 Secondary hyperparathyroidism of renal origin: Secondary | ICD-10-CM | POA: Diagnosis not present

## 2017-10-27 DIAGNOSIS — D631 Anemia in chronic kidney disease: Secondary | ICD-10-CM | POA: Diagnosis not present

## 2017-10-27 DIAGNOSIS — Z4932 Encounter for adequacy testing for peritoneal dialysis: Secondary | ICD-10-CM | POA: Diagnosis not present

## 2017-10-27 DIAGNOSIS — N186 End stage renal disease: Secondary | ICD-10-CM | POA: Diagnosis not present

## 2017-10-27 DIAGNOSIS — D509 Iron deficiency anemia, unspecified: Secondary | ICD-10-CM | POA: Diagnosis not present

## 2017-10-27 DIAGNOSIS — K769 Liver disease, unspecified: Secondary | ICD-10-CM | POA: Diagnosis not present

## 2017-10-27 DIAGNOSIS — N2581 Secondary hyperparathyroidism of renal origin: Secondary | ICD-10-CM | POA: Diagnosis not present

## 2017-10-28 DIAGNOSIS — Z4932 Encounter for adequacy testing for peritoneal dialysis: Secondary | ICD-10-CM | POA: Diagnosis not present

## 2017-10-28 DIAGNOSIS — I7789 Other specified disorders of arteries and arterioles: Secondary | ICD-10-CM | POA: Diagnosis not present

## 2017-10-28 DIAGNOSIS — N2581 Secondary hyperparathyroidism of renal origin: Secondary | ICD-10-CM | POA: Diagnosis not present

## 2017-10-28 DIAGNOSIS — N186 End stage renal disease: Secondary | ICD-10-CM | POA: Diagnosis not present

## 2017-10-28 DIAGNOSIS — Z992 Dependence on renal dialysis: Secondary | ICD-10-CM | POA: Diagnosis not present

## 2017-10-28 DIAGNOSIS — K769 Liver disease, unspecified: Secondary | ICD-10-CM | POA: Diagnosis not present

## 2017-10-28 DIAGNOSIS — D509 Iron deficiency anemia, unspecified: Secondary | ICD-10-CM | POA: Diagnosis not present

## 2017-10-28 DIAGNOSIS — D631 Anemia in chronic kidney disease: Secondary | ICD-10-CM | POA: Diagnosis not present

## 2017-10-29 DIAGNOSIS — D631 Anemia in chronic kidney disease: Secondary | ICD-10-CM | POA: Diagnosis not present

## 2017-10-29 DIAGNOSIS — Z4932 Encounter for adequacy testing for peritoneal dialysis: Secondary | ICD-10-CM | POA: Diagnosis not present

## 2017-10-29 DIAGNOSIS — N2581 Secondary hyperparathyroidism of renal origin: Secondary | ICD-10-CM | POA: Diagnosis not present

## 2017-10-29 DIAGNOSIS — N2589 Other disorders resulting from impaired renal tubular function: Secondary | ICD-10-CM | POA: Diagnosis not present

## 2017-10-29 DIAGNOSIS — Z992 Dependence on renal dialysis: Secondary | ICD-10-CM | POA: Diagnosis not present

## 2017-10-29 DIAGNOSIS — N186 End stage renal disease: Secondary | ICD-10-CM | POA: Diagnosis not present

## 2017-10-29 DIAGNOSIS — I7789 Other specified disorders of arteries and arterioles: Secondary | ICD-10-CM | POA: Diagnosis not present

## 2017-10-29 DIAGNOSIS — D509 Iron deficiency anemia, unspecified: Secondary | ICD-10-CM | POA: Diagnosis not present

## 2017-10-30 DIAGNOSIS — N2589 Other disorders resulting from impaired renal tubular function: Secondary | ICD-10-CM | POA: Diagnosis not present

## 2017-10-30 DIAGNOSIS — D631 Anemia in chronic kidney disease: Secondary | ICD-10-CM | POA: Diagnosis not present

## 2017-10-30 DIAGNOSIS — D509 Iron deficiency anemia, unspecified: Secondary | ICD-10-CM | POA: Diagnosis not present

## 2017-10-30 DIAGNOSIS — N186 End stage renal disease: Secondary | ICD-10-CM | POA: Diagnosis not present

## 2017-10-30 DIAGNOSIS — N2581 Secondary hyperparathyroidism of renal origin: Secondary | ICD-10-CM | POA: Diagnosis not present

## 2017-10-30 DIAGNOSIS — Z4932 Encounter for adequacy testing for peritoneal dialysis: Secondary | ICD-10-CM | POA: Diagnosis not present

## 2017-10-31 DIAGNOSIS — N2589 Other disorders resulting from impaired renal tubular function: Secondary | ICD-10-CM | POA: Diagnosis not present

## 2017-10-31 DIAGNOSIS — N186 End stage renal disease: Secondary | ICD-10-CM | POA: Diagnosis not present

## 2017-10-31 DIAGNOSIS — D631 Anemia in chronic kidney disease: Secondary | ICD-10-CM | POA: Diagnosis not present

## 2017-10-31 DIAGNOSIS — Z4932 Encounter for adequacy testing for peritoneal dialysis: Secondary | ICD-10-CM | POA: Diagnosis not present

## 2017-10-31 DIAGNOSIS — D509 Iron deficiency anemia, unspecified: Secondary | ICD-10-CM | POA: Diagnosis not present

## 2017-10-31 DIAGNOSIS — N2581 Secondary hyperparathyroidism of renal origin: Secondary | ICD-10-CM | POA: Diagnosis not present

## 2017-11-01 DIAGNOSIS — Z4932 Encounter for adequacy testing for peritoneal dialysis: Secondary | ICD-10-CM | POA: Diagnosis not present

## 2017-11-01 DIAGNOSIS — N186 End stage renal disease: Secondary | ICD-10-CM | POA: Diagnosis not present

## 2017-11-01 DIAGNOSIS — D509 Iron deficiency anemia, unspecified: Secondary | ICD-10-CM | POA: Diagnosis not present

## 2017-11-01 DIAGNOSIS — N2589 Other disorders resulting from impaired renal tubular function: Secondary | ICD-10-CM | POA: Diagnosis not present

## 2017-11-01 DIAGNOSIS — N2581 Secondary hyperparathyroidism of renal origin: Secondary | ICD-10-CM | POA: Diagnosis not present

## 2017-11-01 DIAGNOSIS — D631 Anemia in chronic kidney disease: Secondary | ICD-10-CM | POA: Diagnosis not present

## 2017-11-02 DIAGNOSIS — N2581 Secondary hyperparathyroidism of renal origin: Secondary | ICD-10-CM | POA: Diagnosis not present

## 2017-11-02 DIAGNOSIS — D509 Iron deficiency anemia, unspecified: Secondary | ICD-10-CM | POA: Diagnosis not present

## 2017-11-02 DIAGNOSIS — Z4932 Encounter for adequacy testing for peritoneal dialysis: Secondary | ICD-10-CM | POA: Diagnosis not present

## 2017-11-02 DIAGNOSIS — N2589 Other disorders resulting from impaired renal tubular function: Secondary | ICD-10-CM | POA: Diagnosis not present

## 2017-11-02 DIAGNOSIS — N186 End stage renal disease: Secondary | ICD-10-CM | POA: Diagnosis not present

## 2017-11-02 DIAGNOSIS — D631 Anemia in chronic kidney disease: Secondary | ICD-10-CM | POA: Diagnosis not present

## 2017-11-03 DIAGNOSIS — D509 Iron deficiency anemia, unspecified: Secondary | ICD-10-CM | POA: Diagnosis not present

## 2017-11-03 DIAGNOSIS — D631 Anemia in chronic kidney disease: Secondary | ICD-10-CM | POA: Diagnosis not present

## 2017-11-03 DIAGNOSIS — N186 End stage renal disease: Secondary | ICD-10-CM | POA: Diagnosis not present

## 2017-11-03 DIAGNOSIS — N2589 Other disorders resulting from impaired renal tubular function: Secondary | ICD-10-CM | POA: Diagnosis not present

## 2017-11-03 DIAGNOSIS — N2581 Secondary hyperparathyroidism of renal origin: Secondary | ICD-10-CM | POA: Diagnosis not present

## 2017-11-03 DIAGNOSIS — Z4932 Encounter for adequacy testing for peritoneal dialysis: Secondary | ICD-10-CM | POA: Diagnosis not present

## 2017-11-04 DIAGNOSIS — D509 Iron deficiency anemia, unspecified: Secondary | ICD-10-CM | POA: Diagnosis not present

## 2017-11-04 DIAGNOSIS — D631 Anemia in chronic kidney disease: Secondary | ICD-10-CM | POA: Diagnosis not present

## 2017-11-04 DIAGNOSIS — N186 End stage renal disease: Secondary | ICD-10-CM | POA: Diagnosis not present

## 2017-11-04 DIAGNOSIS — N2589 Other disorders resulting from impaired renal tubular function: Secondary | ICD-10-CM | POA: Diagnosis not present

## 2017-11-04 DIAGNOSIS — Z4932 Encounter for adequacy testing for peritoneal dialysis: Secondary | ICD-10-CM | POA: Diagnosis not present

## 2017-11-04 DIAGNOSIS — N2581 Secondary hyperparathyroidism of renal origin: Secondary | ICD-10-CM | POA: Diagnosis not present

## 2017-11-05 DIAGNOSIS — Z4932 Encounter for adequacy testing for peritoneal dialysis: Secondary | ICD-10-CM | POA: Diagnosis not present

## 2017-11-05 DIAGNOSIS — I120 Hypertensive chronic kidney disease with stage 5 chronic kidney disease or end stage renal disease: Secondary | ICD-10-CM | POA: Diagnosis not present

## 2017-11-05 DIAGNOSIS — E7849 Other hyperlipidemia: Secondary | ICD-10-CM | POA: Diagnosis not present

## 2017-11-05 DIAGNOSIS — G9341 Metabolic encephalopathy: Secondary | ICD-10-CM | POA: Diagnosis not present

## 2017-11-05 DIAGNOSIS — R410 Disorientation, unspecified: Secondary | ICD-10-CM | POA: Diagnosis not present

## 2017-11-05 DIAGNOSIS — E871 Hypo-osmolality and hyponatremia: Secondary | ICD-10-CM | POA: Diagnosis not present

## 2017-11-05 DIAGNOSIS — R82998 Other abnormal findings in urine: Secondary | ICD-10-CM | POA: Diagnosis not present

## 2017-11-05 DIAGNOSIS — X58XXXA Exposure to other specified factors, initial encounter: Secondary | ICD-10-CM | POA: Diagnosis not present

## 2017-11-05 DIAGNOSIS — N186 End stage renal disease: Secondary | ICD-10-CM | POA: Diagnosis not present

## 2017-11-05 DIAGNOSIS — Z992 Dependence on renal dialysis: Secondary | ICD-10-CM | POA: Diagnosis not present

## 2017-11-05 DIAGNOSIS — Y999 Unspecified external cause status: Secondary | ICD-10-CM | POA: Diagnosis not present

## 2017-11-05 DIAGNOSIS — D509 Iron deficiency anemia, unspecified: Secondary | ICD-10-CM | POA: Diagnosis not present

## 2017-11-05 DIAGNOSIS — T68XXXA Hypothermia, initial encounter: Secondary | ICD-10-CM | POA: Diagnosis not present

## 2017-11-05 DIAGNOSIS — D631 Anemia in chronic kidney disease: Secondary | ICD-10-CM | POA: Diagnosis not present

## 2017-11-05 DIAGNOSIS — N2589 Other disorders resulting from impaired renal tubular function: Secondary | ICD-10-CM | POA: Diagnosis not present

## 2017-11-05 DIAGNOSIS — N19 Unspecified kidney failure: Secondary | ICD-10-CM | POA: Diagnosis not present

## 2017-11-05 DIAGNOSIS — N2581 Secondary hyperparathyroidism of renal origin: Secondary | ICD-10-CM | POA: Diagnosis not present

## 2017-11-05 DIAGNOSIS — R404 Transient alteration of awareness: Secondary | ICD-10-CM | POA: Diagnosis not present

## 2017-11-05 DIAGNOSIS — I472 Ventricular tachycardia: Secondary | ICD-10-CM | POA: Diagnosis not present

## 2017-11-05 DIAGNOSIS — E874 Mixed disorder of acid-base balance: Secondary | ICD-10-CM | POA: Diagnosis not present

## 2017-11-06 DIAGNOSIS — Z4932 Encounter for adequacy testing for peritoneal dialysis: Secondary | ICD-10-CM | POA: Diagnosis not present

## 2017-11-06 DIAGNOSIS — K900 Celiac disease: Secondary | ICD-10-CM | POA: Diagnosis present

## 2017-11-06 DIAGNOSIS — I120 Hypertensive chronic kidney disease with stage 5 chronic kidney disease or end stage renal disease: Secondary | ICD-10-CM | POA: Diagnosis present

## 2017-11-06 DIAGNOSIS — R4182 Altered mental status, unspecified: Secondary | ICD-10-CM | POA: Diagnosis not present

## 2017-11-06 DIAGNOSIS — N390 Urinary tract infection, site not specified: Secondary | ICD-10-CM | POA: Diagnosis not present

## 2017-11-06 DIAGNOSIS — E876 Hypokalemia: Secondary | ICD-10-CM | POA: Diagnosis present

## 2017-11-06 DIAGNOSIS — E8809 Other disorders of plasma-protein metabolism, not elsewhere classified: Secondary | ICD-10-CM | POA: Diagnosis present

## 2017-11-06 DIAGNOSIS — N281 Cyst of kidney, acquired: Secondary | ICD-10-CM | POA: Diagnosis not present

## 2017-11-06 DIAGNOSIS — R9431 Abnormal electrocardiogram [ECG] [EKG]: Secondary | ICD-10-CM | POA: Diagnosis not present

## 2017-11-06 DIAGNOSIS — G9340 Encephalopathy, unspecified: Secondary | ICD-10-CM | POA: Diagnosis not present

## 2017-11-06 DIAGNOSIS — N261 Atrophy of kidney (terminal): Secondary | ICD-10-CM | POA: Diagnosis not present

## 2017-11-06 DIAGNOSIS — R918 Other nonspecific abnormal finding of lung field: Secondary | ICD-10-CM | POA: Diagnosis not present

## 2017-11-06 DIAGNOSIS — F17220 Nicotine dependence, chewing tobacco, uncomplicated: Secondary | ICD-10-CM | POA: Diagnosis present

## 2017-11-06 DIAGNOSIS — E162 Hypoglycemia, unspecified: Secondary | ICD-10-CM | POA: Diagnosis not present

## 2017-11-06 DIAGNOSIS — R680 Hypothermia, not associated with low environmental temperature: Secondary | ICD-10-CM | POA: Diagnosis present

## 2017-11-06 DIAGNOSIS — Z7409 Other reduced mobility: Secondary | ICD-10-CM | POA: Diagnosis not present

## 2017-11-06 DIAGNOSIS — D509 Iron deficiency anemia, unspecified: Secondary | ICD-10-CM | POA: Diagnosis not present

## 2017-11-06 DIAGNOSIS — I472 Ventricular tachycardia: Secondary | ICD-10-CM | POA: Diagnosis present

## 2017-11-06 DIAGNOSIS — R400 Somnolence: Secondary | ICD-10-CM | POA: Diagnosis not present

## 2017-11-06 DIAGNOSIS — R1310 Dysphagia, unspecified: Secondary | ICD-10-CM | POA: Diagnosis not present

## 2017-11-06 DIAGNOSIS — E039 Hypothyroidism, unspecified: Secondary | ICD-10-CM | POA: Diagnosis present

## 2017-11-06 DIAGNOSIS — F319 Bipolar disorder, unspecified: Secondary | ICD-10-CM | POA: Diagnosis present

## 2017-11-06 DIAGNOSIS — N2581 Secondary hyperparathyroidism of renal origin: Secondary | ICD-10-CM | POA: Diagnosis present

## 2017-11-06 DIAGNOSIS — E874 Mixed disorder of acid-base balance: Secondary | ICD-10-CM | POA: Diagnosis present

## 2017-11-06 DIAGNOSIS — Z992 Dependence on renal dialysis: Secondary | ICD-10-CM | POA: Diagnosis not present

## 2017-11-06 DIAGNOSIS — Z87440 Personal history of urinary (tract) infections: Secondary | ICD-10-CM | POA: Diagnosis not present

## 2017-11-06 DIAGNOSIS — G9341 Metabolic encephalopathy: Secondary | ICD-10-CM | POA: Diagnosis present

## 2017-11-06 DIAGNOSIS — I639 Cerebral infarction, unspecified: Secondary | ICD-10-CM | POA: Diagnosis not present

## 2017-11-06 DIAGNOSIS — Z79899 Other long term (current) drug therapy: Secondary | ICD-10-CM | POA: Diagnosis not present

## 2017-11-06 DIAGNOSIS — N2589 Other disorders resulting from impaired renal tubular function: Secondary | ICD-10-CM | POA: Diagnosis not present

## 2017-11-06 DIAGNOSIS — E871 Hypo-osmolality and hyponatremia: Secondary | ICD-10-CM | POA: Diagnosis present

## 2017-11-06 DIAGNOSIS — I82C22 Chronic embolism and thrombosis of left internal jugular vein: Secondary | ICD-10-CM | POA: Diagnosis present

## 2017-11-06 DIAGNOSIS — M7989 Other specified soft tissue disorders: Secondary | ICD-10-CM | POA: Diagnosis not present

## 2017-11-06 DIAGNOSIS — Z8673 Personal history of transient ischemic attack (TIA), and cerebral infarction without residual deficits: Secondary | ICD-10-CM | POA: Diagnosis not present

## 2017-11-06 DIAGNOSIS — E872 Acidosis: Secondary | ICD-10-CM | POA: Diagnosis not present

## 2017-11-06 DIAGNOSIS — J9601 Acute respiratory failure with hypoxia: Secondary | ICD-10-CM | POA: Diagnosis not present

## 2017-11-06 DIAGNOSIS — R4702 Dysphasia: Secondary | ICD-10-CM | POA: Diagnosis not present

## 2017-11-06 DIAGNOSIS — D631 Anemia in chronic kidney disease: Secondary | ICD-10-CM | POA: Diagnosis present

## 2017-11-06 DIAGNOSIS — N186 End stage renal disease: Secondary | ICD-10-CM | POA: Diagnosis present

## 2017-11-06 DIAGNOSIS — Z7982 Long term (current) use of aspirin: Secondary | ICD-10-CM | POA: Diagnosis not present

## 2017-11-06 MED ORDER — ACETAMINOPHEN 325 MG PO TABS
650.00 | ORAL_TABLET | ORAL | Status: DC
Start: ? — End: 2017-11-06

## 2017-11-06 MED ORDER — NYSTATIN 100000 UNIT/ML MT SUSP
500000.00 | OROMUCOSAL | Status: DC
Start: 2017-11-08 — End: 2017-11-06

## 2017-11-06 MED ORDER — GENERIC EXTERNAL MEDICATION
3.38 | Status: DC
Start: 2017-11-07 — End: 2017-11-06

## 2017-11-06 MED ORDER — HEPARIN SODIUM (PORCINE) 5000 UNIT/ML IJ SOLN
5000.00 | INTRAMUSCULAR | Status: DC
Start: 2017-11-14 — End: 2017-11-06

## 2017-11-06 MED ORDER — CALCITRIOL 0.25 MCG PO CAPS
1.00 | ORAL_CAPSULE | ORAL | Status: DC
Start: 2017-11-15 — End: 2017-11-06

## 2017-11-06 MED ORDER — ATORVASTATIN CALCIUM 10 MG PO TABS
10.00 | ORAL_TABLET | ORAL | Status: DC
Start: 2017-11-14 — End: 2017-11-06

## 2017-11-06 MED ORDER — RISPERIDONE 1 MG PO TABS
1.00 | ORAL_TABLET | ORAL | Status: DC
Start: 2017-11-14 — End: 2017-11-06

## 2017-11-06 MED ORDER — ASPIRIN EC 81 MG PO TBEC
81.00 | DELAYED_RELEASE_TABLET | ORAL | Status: DC
Start: 2017-11-15 — End: 2017-11-06

## 2017-11-06 MED ORDER — DIVALPROEX SODIUM 250 MG PO DR TAB
500.00 | DELAYED_RELEASE_TABLET | ORAL | Status: DC
Start: 2017-11-06 — End: 2017-11-06

## 2017-11-06 MED ORDER — LEVOTHYROXINE SODIUM 125 MCG PO TABS
62.50 | ORAL_TABLET | ORAL | Status: DC
Start: 2017-11-15 — End: 2017-11-06

## 2017-11-06 MED ORDER — NEPHRO-VITE 0.8 MG PO TABS
1.00 | ORAL_TABLET | ORAL | Status: DC
Start: 2017-11-15 — End: 2017-11-06

## 2017-11-07 DIAGNOSIS — D509 Iron deficiency anemia, unspecified: Secondary | ICD-10-CM | POA: Diagnosis not present

## 2017-11-07 DIAGNOSIS — N186 End stage renal disease: Secondary | ICD-10-CM | POA: Diagnosis not present

## 2017-11-07 DIAGNOSIS — N2589 Other disorders resulting from impaired renal tubular function: Secondary | ICD-10-CM | POA: Diagnosis not present

## 2017-11-07 DIAGNOSIS — Z4932 Encounter for adequacy testing for peritoneal dialysis: Secondary | ICD-10-CM | POA: Diagnosis not present

## 2017-11-07 DIAGNOSIS — D631 Anemia in chronic kidney disease: Secondary | ICD-10-CM | POA: Diagnosis not present

## 2017-11-07 DIAGNOSIS — N2581 Secondary hyperparathyroidism of renal origin: Secondary | ICD-10-CM | POA: Diagnosis not present

## 2017-11-08 DIAGNOSIS — N2581 Secondary hyperparathyroidism of renal origin: Secondary | ICD-10-CM | POA: Diagnosis not present

## 2017-11-08 DIAGNOSIS — D509 Iron deficiency anemia, unspecified: Secondary | ICD-10-CM | POA: Diagnosis not present

## 2017-11-08 DIAGNOSIS — D631 Anemia in chronic kidney disease: Secondary | ICD-10-CM | POA: Diagnosis not present

## 2017-11-08 DIAGNOSIS — N186 End stage renal disease: Secondary | ICD-10-CM | POA: Diagnosis not present

## 2017-11-08 DIAGNOSIS — N2589 Other disorders resulting from impaired renal tubular function: Secondary | ICD-10-CM | POA: Diagnosis not present

## 2017-11-08 DIAGNOSIS — Z4932 Encounter for adequacy testing for peritoneal dialysis: Secondary | ICD-10-CM | POA: Diagnosis not present

## 2017-11-08 MED ORDER — DEXTROSE 10 % IV SOLN
125.00 | INTRAVENOUS | Status: DC
Start: ? — End: 2017-11-08

## 2017-11-08 MED ORDER — DIVALPROEX SODIUM 250 MG PO DR TAB
250.00 | DELAYED_RELEASE_TABLET | ORAL | Status: DC
Start: 2017-11-08 — End: 2017-11-08

## 2017-11-08 MED ORDER — GENTAMICIN SULFATE 0.1 % EX CREA
1.00 | TOPICAL_CREAM | CUTANEOUS | Status: DC
Start: ? — End: 2017-11-08

## 2017-11-08 MED ORDER — POTASSIUM CHLORIDE 10 MEQ/100ML IV SOLN
10.00 | INTRAVENOUS | Status: DC
Start: 2017-11-08 — End: 2017-11-08

## 2017-11-09 DIAGNOSIS — N186 End stage renal disease: Secondary | ICD-10-CM | POA: Diagnosis not present

## 2017-11-09 DIAGNOSIS — Z4932 Encounter for adequacy testing for peritoneal dialysis: Secondary | ICD-10-CM | POA: Diagnosis not present

## 2017-11-09 DIAGNOSIS — D509 Iron deficiency anemia, unspecified: Secondary | ICD-10-CM | POA: Diagnosis not present

## 2017-11-09 DIAGNOSIS — N2589 Other disorders resulting from impaired renal tubular function: Secondary | ICD-10-CM | POA: Diagnosis not present

## 2017-11-09 DIAGNOSIS — D631 Anemia in chronic kidney disease: Secondary | ICD-10-CM | POA: Diagnosis not present

## 2017-11-09 DIAGNOSIS — N2581 Secondary hyperparathyroidism of renal origin: Secondary | ICD-10-CM | POA: Diagnosis not present

## 2017-11-10 DIAGNOSIS — N2581 Secondary hyperparathyroidism of renal origin: Secondary | ICD-10-CM | POA: Diagnosis not present

## 2017-11-10 DIAGNOSIS — D509 Iron deficiency anemia, unspecified: Secondary | ICD-10-CM | POA: Diagnosis not present

## 2017-11-10 DIAGNOSIS — N186 End stage renal disease: Secondary | ICD-10-CM | POA: Diagnosis not present

## 2017-11-10 DIAGNOSIS — Z4932 Encounter for adequacy testing for peritoneal dialysis: Secondary | ICD-10-CM | POA: Diagnosis not present

## 2017-11-10 DIAGNOSIS — N2589 Other disorders resulting from impaired renal tubular function: Secondary | ICD-10-CM | POA: Diagnosis not present

## 2017-11-10 DIAGNOSIS — D631 Anemia in chronic kidney disease: Secondary | ICD-10-CM | POA: Diagnosis not present

## 2017-11-11 DIAGNOSIS — D631 Anemia in chronic kidney disease: Secondary | ICD-10-CM | POA: Diagnosis not present

## 2017-11-11 DIAGNOSIS — D509 Iron deficiency anemia, unspecified: Secondary | ICD-10-CM | POA: Diagnosis not present

## 2017-11-11 DIAGNOSIS — N2589 Other disorders resulting from impaired renal tubular function: Secondary | ICD-10-CM | POA: Diagnosis not present

## 2017-11-11 DIAGNOSIS — Z4932 Encounter for adequacy testing for peritoneal dialysis: Secondary | ICD-10-CM | POA: Diagnosis not present

## 2017-11-11 DIAGNOSIS — N2581 Secondary hyperparathyroidism of renal origin: Secondary | ICD-10-CM | POA: Diagnosis not present

## 2017-11-11 DIAGNOSIS — N186 End stage renal disease: Secondary | ICD-10-CM | POA: Diagnosis not present

## 2017-11-12 DIAGNOSIS — D631 Anemia in chronic kidney disease: Secondary | ICD-10-CM | POA: Diagnosis not present

## 2017-11-12 DIAGNOSIS — Z4932 Encounter for adequacy testing for peritoneal dialysis: Secondary | ICD-10-CM | POA: Diagnosis not present

## 2017-11-12 DIAGNOSIS — N2581 Secondary hyperparathyroidism of renal origin: Secondary | ICD-10-CM | POA: Diagnosis not present

## 2017-11-12 DIAGNOSIS — D509 Iron deficiency anemia, unspecified: Secondary | ICD-10-CM | POA: Diagnosis not present

## 2017-11-12 DIAGNOSIS — N2589 Other disorders resulting from impaired renal tubular function: Secondary | ICD-10-CM | POA: Diagnosis not present

## 2017-11-12 DIAGNOSIS — N186 End stage renal disease: Secondary | ICD-10-CM | POA: Diagnosis not present

## 2017-11-13 DIAGNOSIS — N2589 Other disorders resulting from impaired renal tubular function: Secondary | ICD-10-CM | POA: Diagnosis not present

## 2017-11-13 DIAGNOSIS — Z4932 Encounter for adequacy testing for peritoneal dialysis: Secondary | ICD-10-CM | POA: Diagnosis not present

## 2017-11-13 DIAGNOSIS — D631 Anemia in chronic kidney disease: Secondary | ICD-10-CM | POA: Diagnosis not present

## 2017-11-13 DIAGNOSIS — N2581 Secondary hyperparathyroidism of renal origin: Secondary | ICD-10-CM | POA: Diagnosis not present

## 2017-11-13 DIAGNOSIS — N186 End stage renal disease: Secondary | ICD-10-CM | POA: Diagnosis not present

## 2017-11-13 DIAGNOSIS — D509 Iron deficiency anemia, unspecified: Secondary | ICD-10-CM | POA: Diagnosis not present

## 2017-11-14 DIAGNOSIS — D631 Anemia in chronic kidney disease: Secondary | ICD-10-CM | POA: Diagnosis not present

## 2017-11-14 DIAGNOSIS — Z4932 Encounter for adequacy testing for peritoneal dialysis: Secondary | ICD-10-CM | POA: Diagnosis not present

## 2017-11-14 DIAGNOSIS — N2589 Other disorders resulting from impaired renal tubular function: Secondary | ICD-10-CM | POA: Diagnosis not present

## 2017-11-14 DIAGNOSIS — N186 End stage renal disease: Secondary | ICD-10-CM | POA: Diagnosis not present

## 2017-11-14 DIAGNOSIS — N2581 Secondary hyperparathyroidism of renal origin: Secondary | ICD-10-CM | POA: Diagnosis not present

## 2017-11-14 DIAGNOSIS — D509 Iron deficiency anemia, unspecified: Secondary | ICD-10-CM | POA: Diagnosis not present

## 2017-11-14 MED ORDER — SEVELAMER CARBONATE 800 MG PO TABS
800.00 | ORAL_TABLET | ORAL | Status: DC
Start: 2017-11-14 — End: 2017-11-14

## 2017-11-14 MED ORDER — IPRATROPIUM-ALBUTEROL 0.5-2.5 (3) MG/3ML IN SOLN
3.00 | RESPIRATORY_TRACT | Status: DC
Start: ? — End: 2017-11-14

## 2017-11-14 MED ORDER — CULTURELLE PO CAPS
1.00 | ORAL_CAPSULE | ORAL | Status: DC
Start: 2017-11-15 — End: 2017-11-14

## 2017-11-14 MED ORDER — MAGNESIUM OXIDE 400 MG PO TABS
400.00 | ORAL_TABLET | ORAL | Status: DC
Start: 2017-11-14 — End: 2017-11-14

## 2017-11-14 MED ORDER — DIVALPROEX SODIUM 250 MG PO DR TAB
500.00 | DELAYED_RELEASE_TABLET | ORAL | Status: DC
Start: 2017-11-14 — End: 2017-11-14

## 2017-11-15 DIAGNOSIS — N2581 Secondary hyperparathyroidism of renal origin: Secondary | ICD-10-CM | POA: Diagnosis not present

## 2017-11-15 DIAGNOSIS — Z4932 Encounter for adequacy testing for peritoneal dialysis: Secondary | ICD-10-CM | POA: Diagnosis not present

## 2017-11-15 DIAGNOSIS — D631 Anemia in chronic kidney disease: Secondary | ICD-10-CM | POA: Diagnosis not present

## 2017-11-15 DIAGNOSIS — N186 End stage renal disease: Secondary | ICD-10-CM | POA: Diagnosis not present

## 2017-11-15 DIAGNOSIS — N2589 Other disorders resulting from impaired renal tubular function: Secondary | ICD-10-CM | POA: Diagnosis not present

## 2017-11-15 DIAGNOSIS — D509 Iron deficiency anemia, unspecified: Secondary | ICD-10-CM | POA: Diagnosis not present

## 2017-11-16 DIAGNOSIS — N2581 Secondary hyperparathyroidism of renal origin: Secondary | ICD-10-CM | POA: Diagnosis not present

## 2017-11-16 DIAGNOSIS — N2589 Other disorders resulting from impaired renal tubular function: Secondary | ICD-10-CM | POA: Diagnosis not present

## 2017-11-16 DIAGNOSIS — Z4932 Encounter for adequacy testing for peritoneal dialysis: Secondary | ICD-10-CM | POA: Diagnosis not present

## 2017-11-16 DIAGNOSIS — N186 End stage renal disease: Secondary | ICD-10-CM | POA: Diagnosis not present

## 2017-11-16 DIAGNOSIS — I120 Hypertensive chronic kidney disease with stage 5 chronic kidney disease or end stage renal disease: Secondary | ICD-10-CM | POA: Diagnosis not present

## 2017-11-16 DIAGNOSIS — D509 Iron deficiency anemia, unspecified: Secondary | ICD-10-CM | POA: Diagnosis not present

## 2017-11-16 DIAGNOSIS — D631 Anemia in chronic kidney disease: Secondary | ICD-10-CM | POA: Diagnosis not present

## 2017-11-17 DIAGNOSIS — N2589 Other disorders resulting from impaired renal tubular function: Secondary | ICD-10-CM | POA: Diagnosis not present

## 2017-11-17 DIAGNOSIS — N2581 Secondary hyperparathyroidism of renal origin: Secondary | ICD-10-CM | POA: Diagnosis not present

## 2017-11-17 DIAGNOSIS — N186 End stage renal disease: Secondary | ICD-10-CM | POA: Diagnosis not present

## 2017-11-17 DIAGNOSIS — D631 Anemia in chronic kidney disease: Secondary | ICD-10-CM | POA: Diagnosis not present

## 2017-11-17 DIAGNOSIS — Z4932 Encounter for adequacy testing for peritoneal dialysis: Secondary | ICD-10-CM | POA: Diagnosis not present

## 2017-11-17 DIAGNOSIS — D509 Iron deficiency anemia, unspecified: Secondary | ICD-10-CM | POA: Diagnosis not present

## 2017-11-18 DIAGNOSIS — D509 Iron deficiency anemia, unspecified: Secondary | ICD-10-CM | POA: Diagnosis not present

## 2017-11-18 DIAGNOSIS — Z4932 Encounter for adequacy testing for peritoneal dialysis: Secondary | ICD-10-CM | POA: Diagnosis not present

## 2017-11-18 DIAGNOSIS — N186 End stage renal disease: Secondary | ICD-10-CM | POA: Diagnosis not present

## 2017-11-18 DIAGNOSIS — I120 Hypertensive chronic kidney disease with stage 5 chronic kidney disease or end stage renal disease: Secondary | ICD-10-CM | POA: Diagnosis not present

## 2017-11-18 DIAGNOSIS — N2581 Secondary hyperparathyroidism of renal origin: Secondary | ICD-10-CM | POA: Diagnosis not present

## 2017-11-18 DIAGNOSIS — N2589 Other disorders resulting from impaired renal tubular function: Secondary | ICD-10-CM | POA: Diagnosis not present

## 2017-11-18 DIAGNOSIS — D631 Anemia in chronic kidney disease: Secondary | ICD-10-CM | POA: Diagnosis not present

## 2017-11-19 DIAGNOSIS — N2581 Secondary hyperparathyroidism of renal origin: Secondary | ICD-10-CM | POA: Diagnosis not present

## 2017-11-19 DIAGNOSIS — I120 Hypertensive chronic kidney disease with stage 5 chronic kidney disease or end stage renal disease: Secondary | ICD-10-CM | POA: Diagnosis not present

## 2017-11-19 DIAGNOSIS — D631 Anemia in chronic kidney disease: Secondary | ICD-10-CM | POA: Diagnosis not present

## 2017-11-19 DIAGNOSIS — N2589 Other disorders resulting from impaired renal tubular function: Secondary | ICD-10-CM | POA: Diagnosis not present

## 2017-11-19 DIAGNOSIS — D509 Iron deficiency anemia, unspecified: Secondary | ICD-10-CM | POA: Diagnosis not present

## 2017-11-19 DIAGNOSIS — Z4932 Encounter for adequacy testing for peritoneal dialysis: Secondary | ICD-10-CM | POA: Diagnosis not present

## 2017-11-19 DIAGNOSIS — N186 End stage renal disease: Secondary | ICD-10-CM | POA: Diagnosis not present

## 2017-11-20 DIAGNOSIS — D509 Iron deficiency anemia, unspecified: Secondary | ICD-10-CM | POA: Diagnosis not present

## 2017-11-20 DIAGNOSIS — Z4932 Encounter for adequacy testing for peritoneal dialysis: Secondary | ICD-10-CM | POA: Diagnosis not present

## 2017-11-20 DIAGNOSIS — I120 Hypertensive chronic kidney disease with stage 5 chronic kidney disease or end stage renal disease: Secondary | ICD-10-CM | POA: Diagnosis not present

## 2017-11-20 DIAGNOSIS — D631 Anemia in chronic kidney disease: Secondary | ICD-10-CM | POA: Diagnosis not present

## 2017-11-20 DIAGNOSIS — N2589 Other disorders resulting from impaired renal tubular function: Secondary | ICD-10-CM | POA: Diagnosis not present

## 2017-11-20 DIAGNOSIS — N2581 Secondary hyperparathyroidism of renal origin: Secondary | ICD-10-CM | POA: Diagnosis not present

## 2017-11-20 DIAGNOSIS — N186 End stage renal disease: Secondary | ICD-10-CM | POA: Diagnosis not present

## 2017-11-21 DIAGNOSIS — N2581 Secondary hyperparathyroidism of renal origin: Secondary | ICD-10-CM | POA: Diagnosis not present

## 2017-11-21 DIAGNOSIS — D509 Iron deficiency anemia, unspecified: Secondary | ICD-10-CM | POA: Diagnosis not present

## 2017-11-21 DIAGNOSIS — Z4932 Encounter for adequacy testing for peritoneal dialysis: Secondary | ICD-10-CM | POA: Diagnosis not present

## 2017-11-21 DIAGNOSIS — N2589 Other disorders resulting from impaired renal tubular function: Secondary | ICD-10-CM | POA: Diagnosis not present

## 2017-11-21 DIAGNOSIS — D631 Anemia in chronic kidney disease: Secondary | ICD-10-CM | POA: Diagnosis not present

## 2017-11-21 DIAGNOSIS — I120 Hypertensive chronic kidney disease with stage 5 chronic kidney disease or end stage renal disease: Secondary | ICD-10-CM | POA: Diagnosis not present

## 2017-11-21 DIAGNOSIS — N186 End stage renal disease: Secondary | ICD-10-CM | POA: Diagnosis not present

## 2017-11-22 DIAGNOSIS — I120 Hypertensive chronic kidney disease with stage 5 chronic kidney disease or end stage renal disease: Secondary | ICD-10-CM | POA: Diagnosis not present

## 2017-11-22 DIAGNOSIS — N186 End stage renal disease: Secondary | ICD-10-CM | POA: Diagnosis not present

## 2017-11-22 DIAGNOSIS — D631 Anemia in chronic kidney disease: Secondary | ICD-10-CM | POA: Diagnosis not present

## 2017-11-22 DIAGNOSIS — N2581 Secondary hyperparathyroidism of renal origin: Secondary | ICD-10-CM | POA: Diagnosis not present

## 2017-11-22 DIAGNOSIS — N2589 Other disorders resulting from impaired renal tubular function: Secondary | ICD-10-CM | POA: Diagnosis not present

## 2017-11-22 DIAGNOSIS — D509 Iron deficiency anemia, unspecified: Secondary | ICD-10-CM | POA: Diagnosis not present

## 2017-11-22 DIAGNOSIS — Z4932 Encounter for adequacy testing for peritoneal dialysis: Secondary | ICD-10-CM | POA: Diagnosis not present

## 2017-11-23 DIAGNOSIS — N2589 Other disorders resulting from impaired renal tubular function: Secondary | ICD-10-CM | POA: Diagnosis not present

## 2017-11-23 DIAGNOSIS — N186 End stage renal disease: Secondary | ICD-10-CM | POA: Diagnosis not present

## 2017-11-23 DIAGNOSIS — D509 Iron deficiency anemia, unspecified: Secondary | ICD-10-CM | POA: Diagnosis not present

## 2017-11-23 DIAGNOSIS — Z4932 Encounter for adequacy testing for peritoneal dialysis: Secondary | ICD-10-CM | POA: Diagnosis not present

## 2017-11-23 DIAGNOSIS — D631 Anemia in chronic kidney disease: Secondary | ICD-10-CM | POA: Diagnosis not present

## 2017-11-23 DIAGNOSIS — N2581 Secondary hyperparathyroidism of renal origin: Secondary | ICD-10-CM | POA: Diagnosis not present

## 2017-11-24 DIAGNOSIS — N186 End stage renal disease: Secondary | ICD-10-CM | POA: Diagnosis not present

## 2017-11-24 DIAGNOSIS — D631 Anemia in chronic kidney disease: Secondary | ICD-10-CM | POA: Diagnosis not present

## 2017-11-24 DIAGNOSIS — Z4932 Encounter for adequacy testing for peritoneal dialysis: Secondary | ICD-10-CM | POA: Diagnosis not present

## 2017-11-24 DIAGNOSIS — N2589 Other disorders resulting from impaired renal tubular function: Secondary | ICD-10-CM | POA: Diagnosis not present

## 2017-11-24 DIAGNOSIS — N2581 Secondary hyperparathyroidism of renal origin: Secondary | ICD-10-CM | POA: Diagnosis not present

## 2017-11-24 DIAGNOSIS — D509 Iron deficiency anemia, unspecified: Secondary | ICD-10-CM | POA: Diagnosis not present

## 2017-11-25 DIAGNOSIS — N2589 Other disorders resulting from impaired renal tubular function: Secondary | ICD-10-CM | POA: Diagnosis not present

## 2017-11-25 DIAGNOSIS — N2581 Secondary hyperparathyroidism of renal origin: Secondary | ICD-10-CM | POA: Diagnosis not present

## 2017-11-25 DIAGNOSIS — N186 End stage renal disease: Secondary | ICD-10-CM | POA: Diagnosis not present

## 2017-11-25 DIAGNOSIS — D509 Iron deficiency anemia, unspecified: Secondary | ICD-10-CM | POA: Diagnosis not present

## 2017-11-25 DIAGNOSIS — Z4932 Encounter for adequacy testing for peritoneal dialysis: Secondary | ICD-10-CM | POA: Diagnosis not present

## 2017-11-25 DIAGNOSIS — D631 Anemia in chronic kidney disease: Secondary | ICD-10-CM | POA: Diagnosis not present

## 2017-11-26 DIAGNOSIS — I120 Hypertensive chronic kidney disease with stage 5 chronic kidney disease or end stage renal disease: Secondary | ICD-10-CM | POA: Diagnosis not present

## 2017-11-26 DIAGNOSIS — N2589 Other disorders resulting from impaired renal tubular function: Secondary | ICD-10-CM | POA: Diagnosis not present

## 2017-11-26 DIAGNOSIS — Z4932 Encounter for adequacy testing for peritoneal dialysis: Secondary | ICD-10-CM | POA: Diagnosis not present

## 2017-11-26 DIAGNOSIS — D509 Iron deficiency anemia, unspecified: Secondary | ICD-10-CM | POA: Diagnosis not present

## 2017-11-26 DIAGNOSIS — N2581 Secondary hyperparathyroidism of renal origin: Secondary | ICD-10-CM | POA: Diagnosis not present

## 2017-11-26 DIAGNOSIS — D631 Anemia in chronic kidney disease: Secondary | ICD-10-CM | POA: Diagnosis not present

## 2017-11-26 DIAGNOSIS — N186 End stage renal disease: Secondary | ICD-10-CM | POA: Diagnosis not present

## 2017-11-27 ENCOUNTER — Ambulatory Visit: Payer: Medicare Other | Admitting: Family Medicine

## 2017-11-27 DIAGNOSIS — D509 Iron deficiency anemia, unspecified: Secondary | ICD-10-CM | POA: Diagnosis not present

## 2017-11-27 DIAGNOSIS — Z4932 Encounter for adequacy testing for peritoneal dialysis: Secondary | ICD-10-CM | POA: Diagnosis not present

## 2017-11-27 DIAGNOSIS — D631 Anemia in chronic kidney disease: Secondary | ICD-10-CM | POA: Diagnosis not present

## 2017-11-27 DIAGNOSIS — I120 Hypertensive chronic kidney disease with stage 5 chronic kidney disease or end stage renal disease: Secondary | ICD-10-CM | POA: Diagnosis not present

## 2017-11-27 DIAGNOSIS — N2589 Other disorders resulting from impaired renal tubular function: Secondary | ICD-10-CM | POA: Diagnosis not present

## 2017-11-27 DIAGNOSIS — N2581 Secondary hyperparathyroidism of renal origin: Secondary | ICD-10-CM | POA: Diagnosis not present

## 2017-11-27 DIAGNOSIS — N186 End stage renal disease: Secondary | ICD-10-CM | POA: Diagnosis not present

## 2017-11-28 ENCOUNTER — Ambulatory Visit (INDEPENDENT_AMBULATORY_CARE_PROVIDER_SITE_OTHER): Payer: Medicare Other

## 2017-11-28 DIAGNOSIS — D631 Anemia in chronic kidney disease: Secondary | ICD-10-CM | POA: Diagnosis not present

## 2017-11-28 DIAGNOSIS — Z87440 Personal history of urinary (tract) infections: Secondary | ICD-10-CM

## 2017-11-28 DIAGNOSIS — Z9181 History of falling: Secondary | ICD-10-CM

## 2017-11-28 DIAGNOSIS — N186 End stage renal disease: Secondary | ICD-10-CM

## 2017-11-28 DIAGNOSIS — E039 Hypothyroidism, unspecified: Secondary | ICD-10-CM | POA: Diagnosis not present

## 2017-11-28 DIAGNOSIS — K900 Celiac disease: Secondary | ICD-10-CM

## 2017-11-28 DIAGNOSIS — G9341 Metabolic encephalopathy: Secondary | ICD-10-CM

## 2017-11-28 DIAGNOSIS — Z1612 Extended spectrum beta lactamase (ESBL) resistance: Secondary | ICD-10-CM

## 2017-11-28 DIAGNOSIS — F209 Schizophrenia, unspecified: Secondary | ICD-10-CM

## 2017-11-28 DIAGNOSIS — E871 Hypo-osmolality and hyponatremia: Secondary | ICD-10-CM

## 2017-11-28 DIAGNOSIS — I472 Ventricular tachycardia: Secondary | ICD-10-CM

## 2017-11-28 DIAGNOSIS — H9190 Unspecified hearing loss, unspecified ear: Secondary | ICD-10-CM | POA: Diagnosis not present

## 2017-11-28 DIAGNOSIS — Z4932 Encounter for adequacy testing for peritoneal dialysis: Secondary | ICD-10-CM | POA: Diagnosis not present

## 2017-11-28 DIAGNOSIS — I120 Hypertensive chronic kidney disease with stage 5 chronic kidney disease or end stage renal disease: Secondary | ICD-10-CM | POA: Diagnosis not present

## 2017-11-28 DIAGNOSIS — R1310 Dysphagia, unspecified: Secondary | ICD-10-CM

## 2017-11-28 DIAGNOSIS — Z992 Dependence on renal dialysis: Secondary | ICD-10-CM

## 2017-11-28 DIAGNOSIS — J9601 Acute respiratory failure with hypoxia: Secondary | ICD-10-CM | POA: Diagnosis not present

## 2017-11-28 DIAGNOSIS — Z8701 Personal history of pneumonia (recurrent): Secondary | ICD-10-CM

## 2017-11-28 DIAGNOSIS — Z9089 Acquired absence of other organs: Secondary | ICD-10-CM

## 2017-11-28 DIAGNOSIS — Z7982 Long term (current) use of aspirin: Secondary | ICD-10-CM

## 2017-11-28 DIAGNOSIS — F319 Bipolar disorder, unspecified: Secondary | ICD-10-CM

## 2017-11-28 DIAGNOSIS — D509 Iron deficiency anemia, unspecified: Secondary | ICD-10-CM | POA: Diagnosis not present

## 2017-11-28 DIAGNOSIS — N2589 Other disorders resulting from impaired renal tubular function: Secondary | ICD-10-CM | POA: Diagnosis not present

## 2017-11-28 DIAGNOSIS — R680 Hypothermia, not associated with low environmental temperature: Secondary | ICD-10-CM

## 2017-11-28 DIAGNOSIS — N2581 Secondary hyperparathyroidism of renal origin: Secondary | ICD-10-CM | POA: Diagnosis not present

## 2017-11-28 DIAGNOSIS — Z8673 Personal history of transient ischemic attack (TIA), and cerebral infarction without residual deficits: Secondary | ICD-10-CM

## 2017-11-29 DIAGNOSIS — E440 Moderate protein-calorie malnutrition: Secondary | ICD-10-CM | POA: Diagnosis not present

## 2017-11-29 DIAGNOSIS — Z992 Dependence on renal dialysis: Secondary | ICD-10-CM | POA: Diagnosis not present

## 2017-11-29 DIAGNOSIS — R399 Unspecified symptoms and signs involving the genitourinary system: Secondary | ICD-10-CM | POA: Diagnosis not present

## 2017-11-29 DIAGNOSIS — N186 End stage renal disease: Secondary | ICD-10-CM | POA: Diagnosis not present

## 2017-11-29 DIAGNOSIS — I7789 Other specified disorders of arteries and arterioles: Secondary | ICD-10-CM | POA: Diagnosis not present

## 2017-11-29 DIAGNOSIS — N2581 Secondary hyperparathyroidism of renal origin: Secondary | ICD-10-CM | POA: Diagnosis not present

## 2017-11-29 DIAGNOSIS — Z79899 Other long term (current) drug therapy: Secondary | ICD-10-CM | POA: Diagnosis not present

## 2017-11-29 DIAGNOSIS — R17 Unspecified jaundice: Secondary | ICD-10-CM | POA: Diagnosis not present

## 2017-11-29 DIAGNOSIS — D631 Anemia in chronic kidney disease: Secondary | ICD-10-CM | POA: Diagnosis not present

## 2017-11-29 DIAGNOSIS — D509 Iron deficiency anemia, unspecified: Secondary | ICD-10-CM | POA: Diagnosis not present

## 2017-11-30 DIAGNOSIS — Z79899 Other long term (current) drug therapy: Secondary | ICD-10-CM | POA: Diagnosis not present

## 2017-11-30 DIAGNOSIS — D509 Iron deficiency anemia, unspecified: Secondary | ICD-10-CM | POA: Diagnosis not present

## 2017-11-30 DIAGNOSIS — R17 Unspecified jaundice: Secondary | ICD-10-CM | POA: Diagnosis not present

## 2017-11-30 DIAGNOSIS — D631 Anemia in chronic kidney disease: Secondary | ICD-10-CM | POA: Diagnosis not present

## 2017-11-30 DIAGNOSIS — N186 End stage renal disease: Secondary | ICD-10-CM | POA: Diagnosis not present

## 2017-11-30 DIAGNOSIS — N2581 Secondary hyperparathyroidism of renal origin: Secondary | ICD-10-CM | POA: Diagnosis not present

## 2017-12-01 DIAGNOSIS — N2581 Secondary hyperparathyroidism of renal origin: Secondary | ICD-10-CM | POA: Diagnosis not present

## 2017-12-01 DIAGNOSIS — D509 Iron deficiency anemia, unspecified: Secondary | ICD-10-CM | POA: Diagnosis not present

## 2017-12-01 DIAGNOSIS — N186 End stage renal disease: Secondary | ICD-10-CM | POA: Diagnosis not present

## 2017-12-01 DIAGNOSIS — R17 Unspecified jaundice: Secondary | ICD-10-CM | POA: Diagnosis not present

## 2017-12-01 DIAGNOSIS — Z79899 Other long term (current) drug therapy: Secondary | ICD-10-CM | POA: Diagnosis not present

## 2017-12-01 DIAGNOSIS — D631 Anemia in chronic kidney disease: Secondary | ICD-10-CM | POA: Diagnosis not present

## 2017-12-02 ENCOUNTER — Telehealth: Payer: Self-pay | Admitting: *Deleted

## 2017-12-02 DIAGNOSIS — R17 Unspecified jaundice: Secondary | ICD-10-CM | POA: Diagnosis not present

## 2017-12-02 DIAGNOSIS — I120 Hypertensive chronic kidney disease with stage 5 chronic kidney disease or end stage renal disease: Secondary | ICD-10-CM | POA: Diagnosis not present

## 2017-12-02 DIAGNOSIS — D509 Iron deficiency anemia, unspecified: Secondary | ICD-10-CM | POA: Diagnosis not present

## 2017-12-02 DIAGNOSIS — N2581 Secondary hyperparathyroidism of renal origin: Secondary | ICD-10-CM | POA: Diagnosis not present

## 2017-12-02 DIAGNOSIS — N186 End stage renal disease: Secondary | ICD-10-CM | POA: Diagnosis not present

## 2017-12-02 DIAGNOSIS — Z79899 Other long term (current) drug therapy: Secondary | ICD-10-CM | POA: Diagnosis not present

## 2017-12-02 DIAGNOSIS — D631 Anemia in chronic kidney disease: Secondary | ICD-10-CM | POA: Diagnosis not present

## 2017-12-02 NOTE — Telephone Encounter (Signed)
VM from [place] w/ [facility] Son reported pt had not felt well the last few days Pt felt better today. Temp 97.1 has cough, at night coughs up green & gray phlegm. Pt declines needing to be seen Pt coughed once or twice during OT visit

## 2017-12-02 NOTE — Telephone Encounter (Signed)
If anything worsens have them give [place] a call

## 2017-12-03 DIAGNOSIS — D509 Iron deficiency anemia, unspecified: Secondary | ICD-10-CM | POA: Diagnosis not present

## 2017-12-03 DIAGNOSIS — N2581 Secondary hyperparathyroidism of renal origin: Secondary | ICD-10-CM | POA: Diagnosis not present

## 2017-12-03 DIAGNOSIS — R17 Unspecified jaundice: Secondary | ICD-10-CM | POA: Diagnosis not present

## 2017-12-03 DIAGNOSIS — D631 Anemia in chronic kidney disease: Secondary | ICD-10-CM | POA: Diagnosis not present

## 2017-12-03 DIAGNOSIS — Z79899 Other long term (current) drug therapy: Secondary | ICD-10-CM | POA: Diagnosis not present

## 2017-12-03 DIAGNOSIS — N186 End stage renal disease: Secondary | ICD-10-CM | POA: Diagnosis not present

## 2017-12-03 NOTE — Telephone Encounter (Signed)
Son aware and verbalizes understanding.

## 2017-12-04 DIAGNOSIS — Z79899 Other long term (current) drug therapy: Secondary | ICD-10-CM | POA: Diagnosis not present

## 2017-12-04 DIAGNOSIS — N186 End stage renal disease: Secondary | ICD-10-CM | POA: Diagnosis not present

## 2017-12-04 DIAGNOSIS — D509 Iron deficiency anemia, unspecified: Secondary | ICD-10-CM | POA: Diagnosis not present

## 2017-12-04 DIAGNOSIS — R17 Unspecified jaundice: Secondary | ICD-10-CM | POA: Diagnosis not present

## 2017-12-04 DIAGNOSIS — D631 Anemia in chronic kidney disease: Secondary | ICD-10-CM | POA: Diagnosis not present

## 2017-12-04 DIAGNOSIS — N2581 Secondary hyperparathyroidism of renal origin: Secondary | ICD-10-CM | POA: Diagnosis not present

## 2017-12-05 ENCOUNTER — Encounter: Payer: Self-pay | Admitting: Family Medicine

## 2017-12-05 DIAGNOSIS — D509 Iron deficiency anemia, unspecified: Secondary | ICD-10-CM | POA: Diagnosis not present

## 2017-12-05 DIAGNOSIS — R82998 Other abnormal findings in urine: Secondary | ICD-10-CM | POA: Diagnosis not present

## 2017-12-05 DIAGNOSIS — D631 Anemia in chronic kidney disease: Secondary | ICD-10-CM | POA: Diagnosis not present

## 2017-12-05 DIAGNOSIS — N2581 Secondary hyperparathyroidism of renal origin: Secondary | ICD-10-CM | POA: Diagnosis not present

## 2017-12-05 DIAGNOSIS — E7849 Other hyperlipidemia: Secondary | ICD-10-CM | POA: Diagnosis not present

## 2017-12-05 DIAGNOSIS — N186 End stage renal disease: Secondary | ICD-10-CM | POA: Diagnosis not present

## 2017-12-05 DIAGNOSIS — R17 Unspecified jaundice: Secondary | ICD-10-CM | POA: Diagnosis not present

## 2017-12-05 DIAGNOSIS — Z79899 Other long term (current) drug therapy: Secondary | ICD-10-CM | POA: Diagnosis not present

## 2017-12-06 DIAGNOSIS — Z79899 Other long term (current) drug therapy: Secondary | ICD-10-CM | POA: Diagnosis not present

## 2017-12-06 DIAGNOSIS — R17 Unspecified jaundice: Secondary | ICD-10-CM | POA: Diagnosis not present

## 2017-12-06 DIAGNOSIS — N186 End stage renal disease: Secondary | ICD-10-CM | POA: Diagnosis not present

## 2017-12-06 DIAGNOSIS — D509 Iron deficiency anemia, unspecified: Secondary | ICD-10-CM | POA: Diagnosis not present

## 2017-12-06 DIAGNOSIS — N2581 Secondary hyperparathyroidism of renal origin: Secondary | ICD-10-CM | POA: Diagnosis not present

## 2017-12-06 DIAGNOSIS — D631 Anemia in chronic kidney disease: Secondary | ICD-10-CM | POA: Diagnosis not present

## 2017-12-07 DIAGNOSIS — N2581 Secondary hyperparathyroidism of renal origin: Secondary | ICD-10-CM | POA: Diagnosis not present

## 2017-12-07 DIAGNOSIS — D631 Anemia in chronic kidney disease: Secondary | ICD-10-CM | POA: Diagnosis not present

## 2017-12-07 DIAGNOSIS — Z79899 Other long term (current) drug therapy: Secondary | ICD-10-CM | POA: Diagnosis not present

## 2017-12-07 DIAGNOSIS — I120 Hypertensive chronic kidney disease with stage 5 chronic kidney disease or end stage renal disease: Secondary | ICD-10-CM | POA: Diagnosis not present

## 2017-12-07 DIAGNOSIS — N186 End stage renal disease: Secondary | ICD-10-CM | POA: Diagnosis not present

## 2017-12-07 DIAGNOSIS — R17 Unspecified jaundice: Secondary | ICD-10-CM | POA: Diagnosis not present

## 2017-12-07 DIAGNOSIS — D509 Iron deficiency anemia, unspecified: Secondary | ICD-10-CM | POA: Diagnosis not present

## 2017-12-08 DIAGNOSIS — D509 Iron deficiency anemia, unspecified: Secondary | ICD-10-CM | POA: Diagnosis not present

## 2017-12-08 DIAGNOSIS — N2581 Secondary hyperparathyroidism of renal origin: Secondary | ICD-10-CM | POA: Diagnosis not present

## 2017-12-08 DIAGNOSIS — R17 Unspecified jaundice: Secondary | ICD-10-CM | POA: Diagnosis not present

## 2017-12-08 DIAGNOSIS — D631 Anemia in chronic kidney disease: Secondary | ICD-10-CM | POA: Diagnosis not present

## 2017-12-08 DIAGNOSIS — N186 End stage renal disease: Secondary | ICD-10-CM | POA: Diagnosis not present

## 2017-12-08 DIAGNOSIS — Z79899 Other long term (current) drug therapy: Secondary | ICD-10-CM | POA: Diagnosis not present

## 2017-12-09 DIAGNOSIS — N186 End stage renal disease: Secondary | ICD-10-CM | POA: Diagnosis not present

## 2017-12-09 DIAGNOSIS — D631 Anemia in chronic kidney disease: Secondary | ICD-10-CM | POA: Diagnosis not present

## 2017-12-09 DIAGNOSIS — D509 Iron deficiency anemia, unspecified: Secondary | ICD-10-CM | POA: Diagnosis not present

## 2017-12-09 DIAGNOSIS — Z79899 Other long term (current) drug therapy: Secondary | ICD-10-CM | POA: Diagnosis not present

## 2017-12-09 DIAGNOSIS — R17 Unspecified jaundice: Secondary | ICD-10-CM | POA: Diagnosis not present

## 2017-12-09 DIAGNOSIS — N2581 Secondary hyperparathyroidism of renal origin: Secondary | ICD-10-CM | POA: Diagnosis not present

## 2017-12-10 DIAGNOSIS — D631 Anemia in chronic kidney disease: Secondary | ICD-10-CM | POA: Diagnosis not present

## 2017-12-10 DIAGNOSIS — D509 Iron deficiency anemia, unspecified: Secondary | ICD-10-CM | POA: Diagnosis not present

## 2017-12-10 DIAGNOSIS — R17 Unspecified jaundice: Secondary | ICD-10-CM | POA: Diagnosis not present

## 2017-12-10 DIAGNOSIS — N186 End stage renal disease: Secondary | ICD-10-CM | POA: Diagnosis not present

## 2017-12-10 DIAGNOSIS — N2581 Secondary hyperparathyroidism of renal origin: Secondary | ICD-10-CM | POA: Diagnosis not present

## 2017-12-10 DIAGNOSIS — Z79899 Other long term (current) drug therapy: Secondary | ICD-10-CM | POA: Diagnosis not present

## 2017-12-11 ENCOUNTER — Telehealth: Payer: Medicare Other | Admitting: Family

## 2017-12-11 DIAGNOSIS — D509 Iron deficiency anemia, unspecified: Secondary | ICD-10-CM | POA: Diagnosis not present

## 2017-12-11 DIAGNOSIS — R17 Unspecified jaundice: Secondary | ICD-10-CM | POA: Diagnosis not present

## 2017-12-11 DIAGNOSIS — D631 Anemia in chronic kidney disease: Secondary | ICD-10-CM | POA: Diagnosis not present

## 2017-12-11 DIAGNOSIS — R05 Cough: Secondary | ICD-10-CM

## 2017-12-11 DIAGNOSIS — Z79899 Other long term (current) drug therapy: Secondary | ICD-10-CM | POA: Diagnosis not present

## 2017-12-11 DIAGNOSIS — N2581 Secondary hyperparathyroidism of renal origin: Secondary | ICD-10-CM | POA: Diagnosis not present

## 2017-12-11 DIAGNOSIS — N186 End stage renal disease: Secondary | ICD-10-CM | POA: Diagnosis not present

## 2017-12-11 DIAGNOSIS — R059 Cough, unspecified: Secondary | ICD-10-CM

## 2017-12-11 MED ORDER — DOXYCYCLINE HYCLATE 100 MG PO TABS: 100.0000 mg | ORAL_TABLET | Freq: Two times a day (BID) | ORAL | 0 refills | Status: AC

## 2017-12-11 NOTE — Progress Notes (Signed)
We are sorry that you are not feeling well.  Here is how we plan to help!  Based on your presentation I believe you most likely have A cough due to bacteria.  When patients have a fever and a productive cough with a change in color or increased sputum production, we are concerned about bacterial bronchitis.  If left untreated it can progress to pneumonia.  If your symptoms do not improve with your treatment plan it is important that you contact your provider.   I have prescribed Doxycycline 100 mg twice a day for 10 days.    From your responses in the eVisit questionnaire you describe inflammation in the upper respiratory tract which is causing a significant cough.  This is commonly called Bronchitis and has four common causes:    Allergies  Viral Infections  Acid Reflux  Bacterial Infection Allergies, viruses and acid reflux are treated by controlling symptoms or eliminating the cause. An example might be a cough caused by taking certain blood pressure medications. You stop the cough by changing the medication. Another example might be a cough caused by acid reflux. Controlling the reflux helps control the cough.  USE OF BRONCHODILATOR ("RESCUE") INHALERS: There is a risk from using your bronchodilator too frequently.  The risk is that over-reliance on a medication which only relaxes the muscles surrounding the breathing tubes can reduce the effectiveness of medications prescribed to reduce swelling and congestion of the tubes themselves.  Although you feel brief relief from the bronchodilator inhaler, your asthma may actually be worsening with the tubes becoming more swollen and filled with mucus.  This can delay other crucial treatments, such as oral steroid medications. If you need to use a bronchodilator inhaler daily, several times per day, you should discuss this with your provider.  There are probably better treatments that could be used to keep your asthma under control.     HOME  CARE . Only take medications as instructed by your medical team. . Complete the entire course of an antibiotic. . Drink plenty of fluids and get plenty of rest. . Avoid close contacts especially the very young and the elderly . Cover your mouth if you cough or cough into your sleeve. . Always remember to wash your hands . A steam or ultrasonic humidifier can help congestion.   GET HELP RIGHT AWAY IF: . You develop worsening fever. . You become short of breath . You cough up blood. . Your symptoms persist after you have completed your treatment plan MAKE SURE YOU   Understand these instructions.  Will watch your condition.  Will get help right away if you are not doing well or get worse.  Your e-visit answers were reviewed by a board certified advanced clinical practitioner to complete your personal care plan.  Depending on the condition, your plan could have included both over the counter or prescription medications. If there is a problem please reply  once you have received a response from your provider. Your safety is important to [place].  If you have drug allergies check your prescription carefully.    You can use MyChart to ask questions about today's visit, request a non-urgent call back, or ask for a work or school excuse for 24 hours related to this e-Visit. If it has been greater than 24 hours you will need to follow up with your provider, or enter a new e-Visit to address those concerns. You will get an e-mail in the next two days asking about  your experience.  I hope that your e-visit has been valuable and will speed your recovery. Thank you for using e-visits.

## 2017-12-12 DIAGNOSIS — N2581 Secondary hyperparathyroidism of renal origin: Secondary | ICD-10-CM | POA: Diagnosis not present

## 2017-12-12 DIAGNOSIS — D631 Anemia in chronic kidney disease: Secondary | ICD-10-CM | POA: Diagnosis not present

## 2017-12-12 DIAGNOSIS — D509 Iron deficiency anemia, unspecified: Secondary | ICD-10-CM | POA: Diagnosis not present

## 2017-12-12 DIAGNOSIS — R17 Unspecified jaundice: Secondary | ICD-10-CM | POA: Diagnosis not present

## 2017-12-12 DIAGNOSIS — Z79899 Other long term (current) drug therapy: Secondary | ICD-10-CM | POA: Diagnosis not present

## 2017-12-12 DIAGNOSIS — N186 End stage renal disease: Secondary | ICD-10-CM | POA: Diagnosis not present

## 2017-12-13 DIAGNOSIS — Z79899 Other long term (current) drug therapy: Secondary | ICD-10-CM | POA: Diagnosis not present

## 2017-12-13 DIAGNOSIS — N186 End stage renal disease: Secondary | ICD-10-CM | POA: Diagnosis not present

## 2017-12-13 DIAGNOSIS — D509 Iron deficiency anemia, unspecified: Secondary | ICD-10-CM | POA: Diagnosis not present

## 2017-12-13 DIAGNOSIS — D631 Anemia in chronic kidney disease: Secondary | ICD-10-CM | POA: Diagnosis not present

## 2017-12-13 DIAGNOSIS — I120 Hypertensive chronic kidney disease with stage 5 chronic kidney disease or end stage renal disease: Secondary | ICD-10-CM | POA: Diagnosis not present

## 2017-12-13 DIAGNOSIS — N2581 Secondary hyperparathyroidism of renal origin: Secondary | ICD-10-CM | POA: Diagnosis not present

## 2017-12-13 DIAGNOSIS — R17 Unspecified jaundice: Secondary | ICD-10-CM | POA: Diagnosis not present

## 2017-12-14 DIAGNOSIS — N186 End stage renal disease: Secondary | ICD-10-CM | POA: Diagnosis not present

## 2017-12-14 DIAGNOSIS — R17 Unspecified jaundice: Secondary | ICD-10-CM | POA: Diagnosis not present

## 2017-12-14 DIAGNOSIS — N2581 Secondary hyperparathyroidism of renal origin: Secondary | ICD-10-CM | POA: Diagnosis not present

## 2017-12-14 DIAGNOSIS — D631 Anemia in chronic kidney disease: Secondary | ICD-10-CM | POA: Diagnosis not present

## 2017-12-14 DIAGNOSIS — D509 Iron deficiency anemia, unspecified: Secondary | ICD-10-CM | POA: Diagnosis not present

## 2017-12-14 DIAGNOSIS — Z79899 Other long term (current) drug therapy: Secondary | ICD-10-CM | POA: Diagnosis not present

## 2017-12-15 DIAGNOSIS — N2581 Secondary hyperparathyroidism of renal origin: Secondary | ICD-10-CM | POA: Diagnosis not present

## 2017-12-15 DIAGNOSIS — R17 Unspecified jaundice: Secondary | ICD-10-CM | POA: Diagnosis not present

## 2017-12-15 DIAGNOSIS — N186 End stage renal disease: Secondary | ICD-10-CM | POA: Diagnosis not present

## 2017-12-15 DIAGNOSIS — Z79899 Other long term (current) drug therapy: Secondary | ICD-10-CM | POA: Diagnosis not present

## 2017-12-15 DIAGNOSIS — D631 Anemia in chronic kidney disease: Secondary | ICD-10-CM | POA: Diagnosis not present

## 2017-12-15 DIAGNOSIS — D509 Iron deficiency anemia, unspecified: Secondary | ICD-10-CM | POA: Diagnosis not present

## 2017-12-16 DIAGNOSIS — D631 Anemia in chronic kidney disease: Secondary | ICD-10-CM | POA: Diagnosis not present

## 2017-12-16 DIAGNOSIS — N2581 Secondary hyperparathyroidism of renal origin: Secondary | ICD-10-CM | POA: Diagnosis not present

## 2017-12-16 DIAGNOSIS — F319 Bipolar disorder, unspecified: Secondary | ICD-10-CM | POA: Diagnosis not present

## 2017-12-16 DIAGNOSIS — I120 Hypertensive chronic kidney disease with stage 5 chronic kidney disease or end stage renal disease: Secondary | ICD-10-CM | POA: Diagnosis not present

## 2017-12-16 DIAGNOSIS — N186 End stage renal disease: Secondary | ICD-10-CM | POA: Diagnosis not present

## 2017-12-16 DIAGNOSIS — Z79899 Other long term (current) drug therapy: Secondary | ICD-10-CM | POA: Diagnosis not present

## 2017-12-16 DIAGNOSIS — D509 Iron deficiency anemia, unspecified: Secondary | ICD-10-CM | POA: Diagnosis not present

## 2017-12-16 DIAGNOSIS — R17 Unspecified jaundice: Secondary | ICD-10-CM | POA: Diagnosis not present

## 2017-12-17 DIAGNOSIS — N2581 Secondary hyperparathyroidism of renal origin: Secondary | ICD-10-CM | POA: Diagnosis not present

## 2017-12-17 DIAGNOSIS — D509 Iron deficiency anemia, unspecified: Secondary | ICD-10-CM | POA: Diagnosis not present

## 2017-12-17 DIAGNOSIS — Z79899 Other long term (current) drug therapy: Secondary | ICD-10-CM | POA: Diagnosis not present

## 2017-12-17 DIAGNOSIS — N186 End stage renal disease: Secondary | ICD-10-CM | POA: Diagnosis not present

## 2017-12-17 DIAGNOSIS — R17 Unspecified jaundice: Secondary | ICD-10-CM | POA: Diagnosis not present

## 2017-12-17 DIAGNOSIS — D631 Anemia in chronic kidney disease: Secondary | ICD-10-CM | POA: Diagnosis not present

## 2017-12-18 DIAGNOSIS — N186 End stage renal disease: Secondary | ICD-10-CM | POA: Diagnosis not present

## 2017-12-18 DIAGNOSIS — Z79899 Other long term (current) drug therapy: Secondary | ICD-10-CM | POA: Diagnosis not present

## 2017-12-18 DIAGNOSIS — N2581 Secondary hyperparathyroidism of renal origin: Secondary | ICD-10-CM | POA: Diagnosis not present

## 2017-12-18 DIAGNOSIS — I120 Hypertensive chronic kidney disease with stage 5 chronic kidney disease or end stage renal disease: Secondary | ICD-10-CM | POA: Diagnosis not present

## 2017-12-18 DIAGNOSIS — D509 Iron deficiency anemia, unspecified: Secondary | ICD-10-CM | POA: Diagnosis not present

## 2017-12-18 DIAGNOSIS — D631 Anemia in chronic kidney disease: Secondary | ICD-10-CM | POA: Diagnosis not present

## 2017-12-18 DIAGNOSIS — R17 Unspecified jaundice: Secondary | ICD-10-CM | POA: Diagnosis not present

## 2017-12-19 DIAGNOSIS — D631 Anemia in chronic kidney disease: Secondary | ICD-10-CM | POA: Diagnosis not present

## 2017-12-19 DIAGNOSIS — N186 End stage renal disease: Secondary | ICD-10-CM | POA: Diagnosis not present

## 2017-12-19 DIAGNOSIS — Z79899 Other long term (current) drug therapy: Secondary | ICD-10-CM | POA: Diagnosis not present

## 2017-12-19 DIAGNOSIS — D509 Iron deficiency anemia, unspecified: Secondary | ICD-10-CM | POA: Diagnosis not present

## 2017-12-19 DIAGNOSIS — N2581 Secondary hyperparathyroidism of renal origin: Secondary | ICD-10-CM | POA: Diagnosis not present

## 2017-12-19 DIAGNOSIS — R17 Unspecified jaundice: Secondary | ICD-10-CM | POA: Diagnosis not present

## 2017-12-20 DIAGNOSIS — Z79899 Other long term (current) drug therapy: Secondary | ICD-10-CM | POA: Diagnosis not present

## 2017-12-20 DIAGNOSIS — D509 Iron deficiency anemia, unspecified: Secondary | ICD-10-CM | POA: Diagnosis not present

## 2017-12-20 DIAGNOSIS — D631 Anemia in chronic kidney disease: Secondary | ICD-10-CM | POA: Diagnosis not present

## 2017-12-20 DIAGNOSIS — R17 Unspecified jaundice: Secondary | ICD-10-CM | POA: Diagnosis not present

## 2017-12-20 DIAGNOSIS — N186 End stage renal disease: Secondary | ICD-10-CM | POA: Diagnosis not present

## 2017-12-20 DIAGNOSIS — N2581 Secondary hyperparathyroidism of renal origin: Secondary | ICD-10-CM | POA: Diagnosis not present

## 2017-12-21 DIAGNOSIS — N2581 Secondary hyperparathyroidism of renal origin: Secondary | ICD-10-CM | POA: Diagnosis not present

## 2017-12-21 DIAGNOSIS — R17 Unspecified jaundice: Secondary | ICD-10-CM | POA: Diagnosis not present

## 2017-12-21 DIAGNOSIS — Z79899 Other long term (current) drug therapy: Secondary | ICD-10-CM | POA: Diagnosis not present

## 2017-12-21 DIAGNOSIS — N186 End stage renal disease: Secondary | ICD-10-CM | POA: Diagnosis not present

## 2017-12-21 DIAGNOSIS — D509 Iron deficiency anemia, unspecified: Secondary | ICD-10-CM | POA: Diagnosis not present

## 2017-12-21 DIAGNOSIS — D631 Anemia in chronic kidney disease: Secondary | ICD-10-CM | POA: Diagnosis not present

## 2017-12-22 DIAGNOSIS — R17 Unspecified jaundice: Secondary | ICD-10-CM | POA: Diagnosis not present

## 2017-12-22 DIAGNOSIS — Z79899 Other long term (current) drug therapy: Secondary | ICD-10-CM | POA: Diagnosis not present

## 2017-12-22 DIAGNOSIS — N2581 Secondary hyperparathyroidism of renal origin: Secondary | ICD-10-CM | POA: Diagnosis not present

## 2017-12-22 DIAGNOSIS — N186 End stage renal disease: Secondary | ICD-10-CM | POA: Diagnosis not present

## 2017-12-22 DIAGNOSIS — D509 Iron deficiency anemia, unspecified: Secondary | ICD-10-CM | POA: Diagnosis not present

## 2017-12-22 DIAGNOSIS — D631 Anemia in chronic kidney disease: Secondary | ICD-10-CM | POA: Diagnosis not present

## 2017-12-23 DIAGNOSIS — N2581 Secondary hyperparathyroidism of renal origin: Secondary | ICD-10-CM | POA: Diagnosis not present

## 2017-12-23 DIAGNOSIS — D509 Iron deficiency anemia, unspecified: Secondary | ICD-10-CM | POA: Diagnosis not present

## 2017-12-23 DIAGNOSIS — Z79899 Other long term (current) drug therapy: Secondary | ICD-10-CM | POA: Diagnosis not present

## 2017-12-23 DIAGNOSIS — D631 Anemia in chronic kidney disease: Secondary | ICD-10-CM | POA: Diagnosis not present

## 2017-12-23 DIAGNOSIS — R17 Unspecified jaundice: Secondary | ICD-10-CM | POA: Diagnosis not present

## 2017-12-23 DIAGNOSIS — N186 End stage renal disease: Secondary | ICD-10-CM | POA: Diagnosis not present

## 2017-12-24 DIAGNOSIS — R17 Unspecified jaundice: Secondary | ICD-10-CM | POA: Diagnosis not present

## 2017-12-24 DIAGNOSIS — N186 End stage renal disease: Secondary | ICD-10-CM | POA: Diagnosis not present

## 2017-12-24 DIAGNOSIS — Z79899 Other long term (current) drug therapy: Secondary | ICD-10-CM | POA: Diagnosis not present

## 2017-12-24 DIAGNOSIS — D631 Anemia in chronic kidney disease: Secondary | ICD-10-CM | POA: Diagnosis not present

## 2017-12-24 DIAGNOSIS — N2581 Secondary hyperparathyroidism of renal origin: Secondary | ICD-10-CM | POA: Diagnosis not present

## 2017-12-24 DIAGNOSIS — D509 Iron deficiency anemia, unspecified: Secondary | ICD-10-CM | POA: Diagnosis not present

## 2017-12-25 DIAGNOSIS — Z79899 Other long term (current) drug therapy: Secondary | ICD-10-CM | POA: Diagnosis not present

## 2017-12-25 DIAGNOSIS — N2581 Secondary hyperparathyroidism of renal origin: Secondary | ICD-10-CM | POA: Diagnosis not present

## 2017-12-25 DIAGNOSIS — D631 Anemia in chronic kidney disease: Secondary | ICD-10-CM | POA: Diagnosis not present

## 2017-12-25 DIAGNOSIS — R17 Unspecified jaundice: Secondary | ICD-10-CM | POA: Diagnosis not present

## 2017-12-25 DIAGNOSIS — D509 Iron deficiency anemia, unspecified: Secondary | ICD-10-CM | POA: Diagnosis not present

## 2017-12-25 DIAGNOSIS — N186 End stage renal disease: Secondary | ICD-10-CM | POA: Diagnosis not present

## 2017-12-25 DIAGNOSIS — I120 Hypertensive chronic kidney disease with stage 5 chronic kidney disease or end stage renal disease: Secondary | ICD-10-CM | POA: Diagnosis not present

## 2017-12-26 DIAGNOSIS — D509 Iron deficiency anemia, unspecified: Secondary | ICD-10-CM | POA: Diagnosis not present

## 2017-12-26 DIAGNOSIS — R17 Unspecified jaundice: Secondary | ICD-10-CM | POA: Diagnosis not present

## 2017-12-26 DIAGNOSIS — N186 End stage renal disease: Secondary | ICD-10-CM | POA: Diagnosis not present

## 2017-12-26 DIAGNOSIS — N2581 Secondary hyperparathyroidism of renal origin: Secondary | ICD-10-CM | POA: Diagnosis not present

## 2017-12-26 DIAGNOSIS — Z79899 Other long term (current) drug therapy: Secondary | ICD-10-CM | POA: Diagnosis not present

## 2017-12-26 DIAGNOSIS — D631 Anemia in chronic kidney disease: Secondary | ICD-10-CM | POA: Diagnosis not present

## 2017-12-27 DIAGNOSIS — R17 Unspecified jaundice: Secondary | ICD-10-CM | POA: Diagnosis not present

## 2017-12-27 DIAGNOSIS — N186 End stage renal disease: Secondary | ICD-10-CM | POA: Diagnosis not present

## 2017-12-27 DIAGNOSIS — I120 Hypertensive chronic kidney disease with stage 5 chronic kidney disease or end stage renal disease: Secondary | ICD-10-CM | POA: Diagnosis not present

## 2017-12-27 DIAGNOSIS — D509 Iron deficiency anemia, unspecified: Secondary | ICD-10-CM | POA: Diagnosis not present

## 2017-12-27 DIAGNOSIS — D631 Anemia in chronic kidney disease: Secondary | ICD-10-CM | POA: Diagnosis not present

## 2017-12-27 DIAGNOSIS — Z79899 Other long term (current) drug therapy: Secondary | ICD-10-CM | POA: Diagnosis not present

## 2017-12-27 DIAGNOSIS — N2581 Secondary hyperparathyroidism of renal origin: Secondary | ICD-10-CM | POA: Diagnosis not present

## 2017-12-28 DIAGNOSIS — Z79899 Other long term (current) drug therapy: Secondary | ICD-10-CM | POA: Diagnosis not present

## 2017-12-28 DIAGNOSIS — D509 Iron deficiency anemia, unspecified: Secondary | ICD-10-CM | POA: Diagnosis not present

## 2017-12-28 DIAGNOSIS — N186 End stage renal disease: Secondary | ICD-10-CM | POA: Diagnosis not present

## 2017-12-28 DIAGNOSIS — D631 Anemia in chronic kidney disease: Secondary | ICD-10-CM | POA: Diagnosis not present

## 2017-12-28 DIAGNOSIS — N2581 Secondary hyperparathyroidism of renal origin: Secondary | ICD-10-CM | POA: Diagnosis not present

## 2017-12-28 DIAGNOSIS — R17 Unspecified jaundice: Secondary | ICD-10-CM | POA: Diagnosis not present

## 2017-12-29 DIAGNOSIS — Z79899 Other long term (current) drug therapy: Secondary | ICD-10-CM | POA: Diagnosis not present

## 2017-12-29 DIAGNOSIS — D509 Iron deficiency anemia, unspecified: Secondary | ICD-10-CM | POA: Diagnosis not present

## 2017-12-29 DIAGNOSIS — R17 Unspecified jaundice: Secondary | ICD-10-CM | POA: Diagnosis not present

## 2017-12-29 DIAGNOSIS — D630 Anemia in neoplastic disease: Secondary | ICD-10-CM | POA: Diagnosis not present

## 2017-12-29 DIAGNOSIS — E440 Moderate protein-calorie malnutrition: Secondary | ICD-10-CM | POA: Diagnosis not present

## 2017-12-29 DIAGNOSIS — Z992 Dependence on renal dialysis: Secondary | ICD-10-CM | POA: Diagnosis not present

## 2017-12-29 DIAGNOSIS — D631 Anemia in chronic kidney disease: Secondary | ICD-10-CM | POA: Diagnosis not present

## 2017-12-29 DIAGNOSIS — N2581 Secondary hyperparathyroidism of renal origin: Secondary | ICD-10-CM | POA: Diagnosis not present

## 2017-12-29 DIAGNOSIS — I7789 Other specified disorders of arteries and arterioles: Secondary | ICD-10-CM | POA: Diagnosis not present

## 2017-12-29 DIAGNOSIS — N186 End stage renal disease: Secondary | ICD-10-CM | POA: Diagnosis not present

## 2017-12-30 DIAGNOSIS — Z79899 Other long term (current) drug therapy: Secondary | ICD-10-CM | POA: Diagnosis not present

## 2017-12-30 DIAGNOSIS — D631 Anemia in chronic kidney disease: Secondary | ICD-10-CM | POA: Diagnosis not present

## 2017-12-30 DIAGNOSIS — R17 Unspecified jaundice: Secondary | ICD-10-CM | POA: Diagnosis not present

## 2017-12-30 DIAGNOSIS — E440 Moderate protein-calorie malnutrition: Secondary | ICD-10-CM | POA: Diagnosis not present

## 2017-12-30 DIAGNOSIS — N2581 Secondary hyperparathyroidism of renal origin: Secondary | ICD-10-CM | POA: Diagnosis not present

## 2017-12-30 DIAGNOSIS — N186 End stage renal disease: Secondary | ICD-10-CM | POA: Diagnosis not present

## 2017-12-31 DIAGNOSIS — E440 Moderate protein-calorie malnutrition: Secondary | ICD-10-CM | POA: Diagnosis not present

## 2017-12-31 DIAGNOSIS — Z79899 Other long term (current) drug therapy: Secondary | ICD-10-CM | POA: Diagnosis not present

## 2017-12-31 DIAGNOSIS — N2581 Secondary hyperparathyroidism of renal origin: Secondary | ICD-10-CM | POA: Diagnosis not present

## 2017-12-31 DIAGNOSIS — R17 Unspecified jaundice: Secondary | ICD-10-CM | POA: Diagnosis not present

## 2017-12-31 DIAGNOSIS — N186 End stage renal disease: Secondary | ICD-10-CM | POA: Diagnosis not present

## 2017-12-31 DIAGNOSIS — D631 Anemia in chronic kidney disease: Secondary | ICD-10-CM | POA: Diagnosis not present

## 2018-01-01 DIAGNOSIS — D631 Anemia in chronic kidney disease: Secondary | ICD-10-CM | POA: Diagnosis not present

## 2018-01-01 DIAGNOSIS — N186 End stage renal disease: Secondary | ICD-10-CM | POA: Diagnosis not present

## 2018-01-01 DIAGNOSIS — Z79899 Other long term (current) drug therapy: Secondary | ICD-10-CM | POA: Diagnosis not present

## 2018-01-01 DIAGNOSIS — R17 Unspecified jaundice: Secondary | ICD-10-CM | POA: Diagnosis not present

## 2018-01-01 DIAGNOSIS — N2581 Secondary hyperparathyroidism of renal origin: Secondary | ICD-10-CM | POA: Diagnosis not present

## 2018-01-01 DIAGNOSIS — I120 Hypertensive chronic kidney disease with stage 5 chronic kidney disease or end stage renal disease: Secondary | ICD-10-CM | POA: Diagnosis not present

## 2018-01-01 DIAGNOSIS — E440 Moderate protein-calorie malnutrition: Secondary | ICD-10-CM | POA: Diagnosis not present

## 2018-01-02 DIAGNOSIS — Z79899 Other long term (current) drug therapy: Secondary | ICD-10-CM | POA: Diagnosis not present

## 2018-01-02 DIAGNOSIS — N2581 Secondary hyperparathyroidism of renal origin: Secondary | ICD-10-CM | POA: Diagnosis not present

## 2018-01-02 DIAGNOSIS — R82998 Other abnormal findings in urine: Secondary | ICD-10-CM | POA: Diagnosis not present

## 2018-01-02 DIAGNOSIS — R17 Unspecified jaundice: Secondary | ICD-10-CM | POA: Diagnosis not present

## 2018-01-02 DIAGNOSIS — E7849 Other hyperlipidemia: Secondary | ICD-10-CM | POA: Diagnosis not present

## 2018-01-02 DIAGNOSIS — D631 Anemia in chronic kidney disease: Secondary | ICD-10-CM | POA: Diagnosis not present

## 2018-01-02 DIAGNOSIS — E440 Moderate protein-calorie malnutrition: Secondary | ICD-10-CM | POA: Diagnosis not present

## 2018-01-02 DIAGNOSIS — N186 End stage renal disease: Secondary | ICD-10-CM | POA: Diagnosis not present

## 2018-01-02 DIAGNOSIS — I120 Hypertensive chronic kidney disease with stage 5 chronic kidney disease or end stage renal disease: Secondary | ICD-10-CM | POA: Diagnosis not present

## 2018-01-03 DIAGNOSIS — Z79899 Other long term (current) drug therapy: Secondary | ICD-10-CM | POA: Diagnosis not present

## 2018-01-03 DIAGNOSIS — R17 Unspecified jaundice: Secondary | ICD-10-CM | POA: Diagnosis not present

## 2018-01-03 DIAGNOSIS — E440 Moderate protein-calorie malnutrition: Secondary | ICD-10-CM | POA: Diagnosis not present

## 2018-01-03 DIAGNOSIS — D631 Anemia in chronic kidney disease: Secondary | ICD-10-CM | POA: Diagnosis not present

## 2018-01-03 DIAGNOSIS — N186 End stage renal disease: Secondary | ICD-10-CM | POA: Diagnosis not present

## 2018-01-03 DIAGNOSIS — N2581 Secondary hyperparathyroidism of renal origin: Secondary | ICD-10-CM | POA: Diagnosis not present

## 2018-01-04 DIAGNOSIS — E440 Moderate protein-calorie malnutrition: Secondary | ICD-10-CM | POA: Diagnosis not present

## 2018-01-04 DIAGNOSIS — N186 End stage renal disease: Secondary | ICD-10-CM | POA: Diagnosis not present

## 2018-01-04 DIAGNOSIS — N2581 Secondary hyperparathyroidism of renal origin: Secondary | ICD-10-CM | POA: Diagnosis not present

## 2018-01-04 DIAGNOSIS — Z79899 Other long term (current) drug therapy: Secondary | ICD-10-CM | POA: Diagnosis not present

## 2018-01-04 DIAGNOSIS — R17 Unspecified jaundice: Secondary | ICD-10-CM | POA: Diagnosis not present

## 2018-01-04 DIAGNOSIS — D631 Anemia in chronic kidney disease: Secondary | ICD-10-CM | POA: Diagnosis not present

## 2018-01-05 DIAGNOSIS — Z79899 Other long term (current) drug therapy: Secondary | ICD-10-CM | POA: Diagnosis not present

## 2018-01-05 DIAGNOSIS — R17 Unspecified jaundice: Secondary | ICD-10-CM | POA: Diagnosis not present

## 2018-01-05 DIAGNOSIS — D631 Anemia in chronic kidney disease: Secondary | ICD-10-CM | POA: Diagnosis not present

## 2018-01-05 DIAGNOSIS — E440 Moderate protein-calorie malnutrition: Secondary | ICD-10-CM | POA: Diagnosis not present

## 2018-01-05 DIAGNOSIS — N186 End stage renal disease: Secondary | ICD-10-CM | POA: Diagnosis not present

## 2018-01-05 DIAGNOSIS — N2581 Secondary hyperparathyroidism of renal origin: Secondary | ICD-10-CM | POA: Diagnosis not present

## 2018-01-06 DIAGNOSIS — R17 Unspecified jaundice: Secondary | ICD-10-CM | POA: Diagnosis not present

## 2018-01-06 DIAGNOSIS — N2581 Secondary hyperparathyroidism of renal origin: Secondary | ICD-10-CM | POA: Diagnosis not present

## 2018-01-06 DIAGNOSIS — E440 Moderate protein-calorie malnutrition: Secondary | ICD-10-CM | POA: Diagnosis not present

## 2018-01-06 DIAGNOSIS — Z79899 Other long term (current) drug therapy: Secondary | ICD-10-CM | POA: Diagnosis not present

## 2018-01-06 DIAGNOSIS — D631 Anemia in chronic kidney disease: Secondary | ICD-10-CM | POA: Diagnosis not present

## 2018-01-06 DIAGNOSIS — N186 End stage renal disease: Secondary | ICD-10-CM | POA: Diagnosis not present

## 2018-01-07 DIAGNOSIS — N186 End stage renal disease: Secondary | ICD-10-CM | POA: Diagnosis not present

## 2018-01-07 DIAGNOSIS — Z79899 Other long term (current) drug therapy: Secondary | ICD-10-CM | POA: Diagnosis not present

## 2018-01-07 DIAGNOSIS — R17 Unspecified jaundice: Secondary | ICD-10-CM | POA: Diagnosis not present

## 2018-01-07 DIAGNOSIS — D631 Anemia in chronic kidney disease: Secondary | ICD-10-CM | POA: Diagnosis not present

## 2018-01-07 DIAGNOSIS — N2581 Secondary hyperparathyroidism of renal origin: Secondary | ICD-10-CM | POA: Diagnosis not present

## 2018-01-07 DIAGNOSIS — E440 Moderate protein-calorie malnutrition: Secondary | ICD-10-CM | POA: Diagnosis not present

## 2018-01-08 DIAGNOSIS — E440 Moderate protein-calorie malnutrition: Secondary | ICD-10-CM | POA: Diagnosis not present

## 2018-01-08 DIAGNOSIS — N2581 Secondary hyperparathyroidism of renal origin: Secondary | ICD-10-CM | POA: Diagnosis not present

## 2018-01-08 DIAGNOSIS — R17 Unspecified jaundice: Secondary | ICD-10-CM | POA: Diagnosis not present

## 2018-01-08 DIAGNOSIS — Z79899 Other long term (current) drug therapy: Secondary | ICD-10-CM | POA: Diagnosis not present

## 2018-01-08 DIAGNOSIS — D631 Anemia in chronic kidney disease: Secondary | ICD-10-CM | POA: Diagnosis not present

## 2018-01-08 DIAGNOSIS — N186 End stage renal disease: Secondary | ICD-10-CM | POA: Diagnosis not present

## 2018-01-08 DIAGNOSIS — I120 Hypertensive chronic kidney disease with stage 5 chronic kidney disease or end stage renal disease: Secondary | ICD-10-CM | POA: Diagnosis not present

## 2018-01-09 DIAGNOSIS — D631 Anemia in chronic kidney disease: Secondary | ICD-10-CM | POA: Diagnosis not present

## 2018-01-09 DIAGNOSIS — N2581 Secondary hyperparathyroidism of renal origin: Secondary | ICD-10-CM | POA: Diagnosis not present

## 2018-01-09 DIAGNOSIS — R17 Unspecified jaundice: Secondary | ICD-10-CM | POA: Diagnosis not present

## 2018-01-09 DIAGNOSIS — N186 End stage renal disease: Secondary | ICD-10-CM | POA: Diagnosis not present

## 2018-01-09 DIAGNOSIS — E440 Moderate protein-calorie malnutrition: Secondary | ICD-10-CM | POA: Diagnosis not present

## 2018-01-09 DIAGNOSIS — Z79899 Other long term (current) drug therapy: Secondary | ICD-10-CM | POA: Diagnosis not present

## 2018-01-10 DIAGNOSIS — Z79899 Other long term (current) drug therapy: Secondary | ICD-10-CM | POA: Diagnosis not present

## 2018-01-10 DIAGNOSIS — N186 End stage renal disease: Secondary | ICD-10-CM | POA: Diagnosis not present

## 2018-01-10 DIAGNOSIS — D631 Anemia in chronic kidney disease: Secondary | ICD-10-CM | POA: Diagnosis not present

## 2018-01-10 DIAGNOSIS — N2581 Secondary hyperparathyroidism of renal origin: Secondary | ICD-10-CM | POA: Diagnosis not present

## 2018-01-10 DIAGNOSIS — R17 Unspecified jaundice: Secondary | ICD-10-CM | POA: Diagnosis not present

## 2018-01-10 DIAGNOSIS — E440 Moderate protein-calorie malnutrition: Secondary | ICD-10-CM | POA: Diagnosis not present

## 2018-01-11 DIAGNOSIS — E440 Moderate protein-calorie malnutrition: Secondary | ICD-10-CM | POA: Diagnosis not present

## 2018-01-11 DIAGNOSIS — N2581 Secondary hyperparathyroidism of renal origin: Secondary | ICD-10-CM | POA: Diagnosis not present

## 2018-01-11 DIAGNOSIS — Z79899 Other long term (current) drug therapy: Secondary | ICD-10-CM | POA: Diagnosis not present

## 2018-01-11 DIAGNOSIS — D631 Anemia in chronic kidney disease: Secondary | ICD-10-CM | POA: Diagnosis not present

## 2018-01-11 DIAGNOSIS — N186 End stage renal disease: Secondary | ICD-10-CM | POA: Diagnosis not present

## 2018-01-11 DIAGNOSIS — R17 Unspecified jaundice: Secondary | ICD-10-CM | POA: Diagnosis not present

## 2018-01-12 DIAGNOSIS — N2581 Secondary hyperparathyroidism of renal origin: Secondary | ICD-10-CM | POA: Diagnosis not present

## 2018-01-12 DIAGNOSIS — E440 Moderate protein-calorie malnutrition: Secondary | ICD-10-CM | POA: Diagnosis not present

## 2018-01-12 DIAGNOSIS — R17 Unspecified jaundice: Secondary | ICD-10-CM | POA: Diagnosis not present

## 2018-01-12 DIAGNOSIS — D631 Anemia in chronic kidney disease: Secondary | ICD-10-CM | POA: Diagnosis not present

## 2018-01-12 DIAGNOSIS — N186 End stage renal disease: Secondary | ICD-10-CM | POA: Diagnosis not present

## 2018-01-12 DIAGNOSIS — Z79899 Other long term (current) drug therapy: Secondary | ICD-10-CM | POA: Diagnosis not present

## 2018-01-13 DIAGNOSIS — E440 Moderate protein-calorie malnutrition: Secondary | ICD-10-CM | POA: Diagnosis not present

## 2018-01-13 DIAGNOSIS — D631 Anemia in chronic kidney disease: Secondary | ICD-10-CM | POA: Diagnosis not present

## 2018-01-13 DIAGNOSIS — N2581 Secondary hyperparathyroidism of renal origin: Secondary | ICD-10-CM | POA: Diagnosis not present

## 2018-01-13 DIAGNOSIS — R17 Unspecified jaundice: Secondary | ICD-10-CM | POA: Diagnosis not present

## 2018-01-13 DIAGNOSIS — Z79899 Other long term (current) drug therapy: Secondary | ICD-10-CM | POA: Diagnosis not present

## 2018-01-13 DIAGNOSIS — N186 End stage renal disease: Secondary | ICD-10-CM | POA: Diagnosis not present

## 2018-01-14 DIAGNOSIS — D631 Anemia in chronic kidney disease: Secondary | ICD-10-CM | POA: Diagnosis not present

## 2018-01-14 DIAGNOSIS — R17 Unspecified jaundice: Secondary | ICD-10-CM | POA: Diagnosis not present

## 2018-01-14 DIAGNOSIS — E440 Moderate protein-calorie malnutrition: Secondary | ICD-10-CM | POA: Diagnosis not present

## 2018-01-14 DIAGNOSIS — N2581 Secondary hyperparathyroidism of renal origin: Secondary | ICD-10-CM | POA: Diagnosis not present

## 2018-01-14 DIAGNOSIS — N186 End stage renal disease: Secondary | ICD-10-CM | POA: Diagnosis not present

## 2018-01-14 DIAGNOSIS — Z79899 Other long term (current) drug therapy: Secondary | ICD-10-CM | POA: Diagnosis not present

## 2018-01-15 DIAGNOSIS — R17 Unspecified jaundice: Secondary | ICD-10-CM | POA: Diagnosis not present

## 2018-01-15 DIAGNOSIS — D631 Anemia in chronic kidney disease: Secondary | ICD-10-CM | POA: Diagnosis not present

## 2018-01-15 DIAGNOSIS — N2581 Secondary hyperparathyroidism of renal origin: Secondary | ICD-10-CM | POA: Diagnosis not present

## 2018-01-15 DIAGNOSIS — E440 Moderate protein-calorie malnutrition: Secondary | ICD-10-CM | POA: Diagnosis not present

## 2018-01-15 DIAGNOSIS — N186 End stage renal disease: Secondary | ICD-10-CM | POA: Diagnosis not present

## 2018-01-15 DIAGNOSIS — Z79899 Other long term (current) drug therapy: Secondary | ICD-10-CM | POA: Diagnosis not present

## 2018-01-16 DIAGNOSIS — Z79899 Other long term (current) drug therapy: Secondary | ICD-10-CM | POA: Diagnosis not present

## 2018-01-16 DIAGNOSIS — N186 End stage renal disease: Secondary | ICD-10-CM | POA: Diagnosis not present

## 2018-01-16 DIAGNOSIS — N2581 Secondary hyperparathyroidism of renal origin: Secondary | ICD-10-CM | POA: Diagnosis not present

## 2018-01-16 DIAGNOSIS — E440 Moderate protein-calorie malnutrition: Secondary | ICD-10-CM | POA: Diagnosis not present

## 2018-01-16 DIAGNOSIS — D631 Anemia in chronic kidney disease: Secondary | ICD-10-CM | POA: Diagnosis not present

## 2018-01-16 DIAGNOSIS — R17 Unspecified jaundice: Secondary | ICD-10-CM | POA: Diagnosis not present

## 2018-01-17 DIAGNOSIS — Z79899 Other long term (current) drug therapy: Secondary | ICD-10-CM | POA: Diagnosis not present

## 2018-01-17 DIAGNOSIS — E440 Moderate protein-calorie malnutrition: Secondary | ICD-10-CM | POA: Diagnosis not present

## 2018-01-17 DIAGNOSIS — D631 Anemia in chronic kidney disease: Secondary | ICD-10-CM | POA: Diagnosis not present

## 2018-01-17 DIAGNOSIS — R17 Unspecified jaundice: Secondary | ICD-10-CM | POA: Diagnosis not present

## 2018-01-17 DIAGNOSIS — N186 End stage renal disease: Secondary | ICD-10-CM | POA: Diagnosis not present

## 2018-01-17 DIAGNOSIS — N2581 Secondary hyperparathyroidism of renal origin: Secondary | ICD-10-CM | POA: Diagnosis not present

## 2018-01-18 DIAGNOSIS — Z79899 Other long term (current) drug therapy: Secondary | ICD-10-CM | POA: Diagnosis not present

## 2018-01-18 DIAGNOSIS — E440 Moderate protein-calorie malnutrition: Secondary | ICD-10-CM | POA: Diagnosis not present

## 2018-01-18 DIAGNOSIS — N2581 Secondary hyperparathyroidism of renal origin: Secondary | ICD-10-CM | POA: Diagnosis not present

## 2018-01-18 DIAGNOSIS — R17 Unspecified jaundice: Secondary | ICD-10-CM | POA: Diagnosis not present

## 2018-01-18 DIAGNOSIS — D631 Anemia in chronic kidney disease: Secondary | ICD-10-CM | POA: Diagnosis not present

## 2018-01-18 DIAGNOSIS — N186 End stage renal disease: Secondary | ICD-10-CM | POA: Diagnosis not present

## 2018-01-19 DIAGNOSIS — Z79899 Other long term (current) drug therapy: Secondary | ICD-10-CM | POA: Diagnosis not present

## 2018-01-19 DIAGNOSIS — E440 Moderate protein-calorie malnutrition: Secondary | ICD-10-CM | POA: Diagnosis not present

## 2018-01-19 DIAGNOSIS — D631 Anemia in chronic kidney disease: Secondary | ICD-10-CM | POA: Diagnosis not present

## 2018-01-19 DIAGNOSIS — R17 Unspecified jaundice: Secondary | ICD-10-CM | POA: Diagnosis not present

## 2018-01-19 DIAGNOSIS — N2581 Secondary hyperparathyroidism of renal origin: Secondary | ICD-10-CM | POA: Diagnosis not present

## 2018-01-19 DIAGNOSIS — N186 End stage renal disease: Secondary | ICD-10-CM | POA: Diagnosis not present

## 2018-01-20 DIAGNOSIS — R17 Unspecified jaundice: Secondary | ICD-10-CM | POA: Diagnosis not present

## 2018-01-20 DIAGNOSIS — E440 Moderate protein-calorie malnutrition: Secondary | ICD-10-CM | POA: Diagnosis not present

## 2018-01-20 DIAGNOSIS — N186 End stage renal disease: Secondary | ICD-10-CM | POA: Diagnosis not present

## 2018-01-20 DIAGNOSIS — N2581 Secondary hyperparathyroidism of renal origin: Secondary | ICD-10-CM | POA: Diagnosis not present

## 2018-01-20 DIAGNOSIS — Z79899 Other long term (current) drug therapy: Secondary | ICD-10-CM | POA: Diagnosis not present

## 2018-01-20 DIAGNOSIS — D631 Anemia in chronic kidney disease: Secondary | ICD-10-CM | POA: Diagnosis not present

## 2018-01-21 DIAGNOSIS — N2581 Secondary hyperparathyroidism of renal origin: Secondary | ICD-10-CM | POA: Diagnosis not present

## 2018-01-21 DIAGNOSIS — R17 Unspecified jaundice: Secondary | ICD-10-CM | POA: Diagnosis not present

## 2018-01-21 DIAGNOSIS — N186 End stage renal disease: Secondary | ICD-10-CM | POA: Diagnosis not present

## 2018-01-21 DIAGNOSIS — E440 Moderate protein-calorie malnutrition: Secondary | ICD-10-CM | POA: Diagnosis not present

## 2018-01-21 DIAGNOSIS — D631 Anemia in chronic kidney disease: Secondary | ICD-10-CM | POA: Diagnosis not present

## 2018-01-21 DIAGNOSIS — Z79899 Other long term (current) drug therapy: Secondary | ICD-10-CM | POA: Diagnosis not present

## 2018-01-22 DIAGNOSIS — Z79899 Other long term (current) drug therapy: Secondary | ICD-10-CM | POA: Diagnosis not present

## 2018-01-22 DIAGNOSIS — N2581 Secondary hyperparathyroidism of renal origin: Secondary | ICD-10-CM | POA: Diagnosis not present

## 2018-01-22 DIAGNOSIS — D631 Anemia in chronic kidney disease: Secondary | ICD-10-CM | POA: Diagnosis not present

## 2018-01-22 DIAGNOSIS — E440 Moderate protein-calorie malnutrition: Secondary | ICD-10-CM | POA: Diagnosis not present

## 2018-01-22 DIAGNOSIS — R17 Unspecified jaundice: Secondary | ICD-10-CM | POA: Diagnosis not present

## 2018-01-22 DIAGNOSIS — N186 End stage renal disease: Secondary | ICD-10-CM | POA: Diagnosis not present

## 2018-01-23 DIAGNOSIS — Z79899 Other long term (current) drug therapy: Secondary | ICD-10-CM | POA: Diagnosis not present

## 2018-01-23 DIAGNOSIS — N186 End stage renal disease: Secondary | ICD-10-CM | POA: Diagnosis not present

## 2018-01-23 DIAGNOSIS — D631 Anemia in chronic kidney disease: Secondary | ICD-10-CM | POA: Diagnosis not present

## 2018-01-23 DIAGNOSIS — E440 Moderate protein-calorie malnutrition: Secondary | ICD-10-CM | POA: Diagnosis not present

## 2018-01-23 DIAGNOSIS — R17 Unspecified jaundice: Secondary | ICD-10-CM | POA: Diagnosis not present

## 2018-01-23 DIAGNOSIS — N2581 Secondary hyperparathyroidism of renal origin: Secondary | ICD-10-CM | POA: Diagnosis not present

## 2018-01-24 DIAGNOSIS — N2581 Secondary hyperparathyroidism of renal origin: Secondary | ICD-10-CM | POA: Diagnosis not present

## 2018-01-24 DIAGNOSIS — N186 End stage renal disease: Secondary | ICD-10-CM | POA: Diagnosis not present

## 2018-01-24 DIAGNOSIS — R17 Unspecified jaundice: Secondary | ICD-10-CM | POA: Diagnosis not present

## 2018-01-24 DIAGNOSIS — D631 Anemia in chronic kidney disease: Secondary | ICD-10-CM | POA: Diagnosis not present

## 2018-01-24 DIAGNOSIS — Z79899 Other long term (current) drug therapy: Secondary | ICD-10-CM | POA: Diagnosis not present

## 2018-01-24 DIAGNOSIS — E440 Moderate protein-calorie malnutrition: Secondary | ICD-10-CM | POA: Diagnosis not present

## 2018-01-25 DIAGNOSIS — Z79899 Other long term (current) drug therapy: Secondary | ICD-10-CM | POA: Diagnosis not present

## 2018-01-25 DIAGNOSIS — N186 End stage renal disease: Secondary | ICD-10-CM | POA: Diagnosis not present

## 2018-01-25 DIAGNOSIS — N2581 Secondary hyperparathyroidism of renal origin: Secondary | ICD-10-CM | POA: Diagnosis not present

## 2018-01-25 DIAGNOSIS — D631 Anemia in chronic kidney disease: Secondary | ICD-10-CM | POA: Diagnosis not present

## 2018-01-25 DIAGNOSIS — R17 Unspecified jaundice: Secondary | ICD-10-CM | POA: Diagnosis not present

## 2018-01-25 DIAGNOSIS — E440 Moderate protein-calorie malnutrition: Secondary | ICD-10-CM | POA: Diagnosis not present

## 2018-01-26 DIAGNOSIS — D631 Anemia in chronic kidney disease: Secondary | ICD-10-CM | POA: Diagnosis not present

## 2018-01-26 DIAGNOSIS — N2581 Secondary hyperparathyroidism of renal origin: Secondary | ICD-10-CM | POA: Diagnosis not present

## 2018-01-26 DIAGNOSIS — Z79899 Other long term (current) drug therapy: Secondary | ICD-10-CM | POA: Diagnosis not present

## 2018-01-26 DIAGNOSIS — N186 End stage renal disease: Secondary | ICD-10-CM | POA: Diagnosis not present

## 2018-01-26 DIAGNOSIS — E440 Moderate protein-calorie malnutrition: Secondary | ICD-10-CM | POA: Diagnosis not present

## 2018-01-26 DIAGNOSIS — R17 Unspecified jaundice: Secondary | ICD-10-CM | POA: Diagnosis not present

## 2018-01-27 DIAGNOSIS — D631 Anemia in chronic kidney disease: Secondary | ICD-10-CM | POA: Diagnosis present

## 2018-01-27 DIAGNOSIS — M7989 Other specified soft tissue disorders: Secondary | ICD-10-CM | POA: Diagnosis not present

## 2018-01-27 DIAGNOSIS — I871 Compression of vein: Secondary | ICD-10-CM | POA: Diagnosis present

## 2018-01-27 DIAGNOSIS — Z79899 Other long term (current) drug therapy: Secondary | ICD-10-CM | POA: Diagnosis not present

## 2018-01-27 DIAGNOSIS — Z7982 Long term (current) use of aspirin: Secondary | ICD-10-CM | POA: Diagnosis not present

## 2018-01-27 DIAGNOSIS — D509 Iron deficiency anemia, unspecified: Secondary | ICD-10-CM | POA: Diagnosis not present

## 2018-01-27 DIAGNOSIS — Z882 Allergy status to sulfonamides status: Secondary | ICD-10-CM | POA: Diagnosis not present

## 2018-01-27 DIAGNOSIS — R17 Unspecified jaundice: Secondary | ICD-10-CM | POA: Diagnosis not present

## 2018-01-27 DIAGNOSIS — R05 Cough: Secondary | ICD-10-CM | POA: Diagnosis not present

## 2018-01-27 DIAGNOSIS — E039 Hypothyroidism, unspecified: Secondary | ICD-10-CM | POA: Diagnosis present

## 2018-01-27 DIAGNOSIS — R6521 Severe sepsis with septic shock: Secondary | ICD-10-CM | POA: Diagnosis not present

## 2018-01-27 DIAGNOSIS — L89899 Pressure ulcer of other site, unspecified stage: Secondary | ICD-10-CM | POA: Diagnosis not present

## 2018-01-27 DIAGNOSIS — I120 Hypertensive chronic kidney disease with stage 5 chronic kidney disease or end stage renal disease: Secondary | ICD-10-CM | POA: Diagnosis present

## 2018-01-27 DIAGNOSIS — Z4932 Encounter for adequacy testing for peritoneal dialysis: Secondary | ICD-10-CM | POA: Diagnosis not present

## 2018-01-27 DIAGNOSIS — I9589 Other hypotension: Secondary | ICD-10-CM | POA: Diagnosis not present

## 2018-01-27 DIAGNOSIS — H5702 Anisocoria: Secondary | ICD-10-CM | POA: Diagnosis not present

## 2018-01-27 DIAGNOSIS — I953 Hypotension of hemodialysis: Secondary | ICD-10-CM | POA: Diagnosis not present

## 2018-01-27 DIAGNOSIS — R1310 Dysphagia, unspecified: Secondary | ICD-10-CM | POA: Diagnosis not present

## 2018-01-27 DIAGNOSIS — N2581 Secondary hyperparathyroidism of renal origin: Secondary | ICD-10-CM | POA: Diagnosis present

## 2018-01-27 DIAGNOSIS — L03114 Cellulitis of left upper limb: Secondary | ICD-10-CM | POA: Diagnosis present

## 2018-01-27 DIAGNOSIS — D72829 Elevated white blood cell count, unspecified: Secondary | ICD-10-CM | POA: Diagnosis not present

## 2018-01-27 DIAGNOSIS — I708 Atherosclerosis of other arteries: Secondary | ICD-10-CM | POA: Diagnosis present

## 2018-01-27 DIAGNOSIS — H16002 Unspecified corneal ulcer, left eye: Secondary | ICD-10-CM | POA: Diagnosis present

## 2018-01-27 DIAGNOSIS — R918 Other nonspecific abnormal finding of lung field: Secondary | ICD-10-CM | POA: Diagnosis not present

## 2018-01-27 DIAGNOSIS — H109 Unspecified conjunctivitis: Secondary | ICD-10-CM | POA: Diagnosis not present

## 2018-01-27 DIAGNOSIS — T82858A Stenosis of vascular prosthetic devices, implants and grafts, initial encounter: Secondary | ICD-10-CM | POA: Diagnosis present

## 2018-01-27 DIAGNOSIS — R000 Tachycardia, unspecified: Secondary | ICD-10-CM | POA: Diagnosis not present

## 2018-01-27 DIAGNOSIS — Z992 Dependence on renal dialysis: Secondary | ICD-10-CM | POA: Diagnosis not present

## 2018-01-27 DIAGNOSIS — A419 Sepsis, unspecified organism: Secondary | ICD-10-CM | POA: Diagnosis not present

## 2018-01-27 DIAGNOSIS — R9431 Abnormal electrocardiogram [ECG] [EKG]: Secondary | ICD-10-CM | POA: Diagnosis not present

## 2018-01-27 DIAGNOSIS — I493 Ventricular premature depolarization: Secondary | ICD-10-CM | POA: Diagnosis not present

## 2018-01-27 DIAGNOSIS — Z8249 Family history of ischemic heart disease and other diseases of the circulatory system: Secondary | ICD-10-CM | POA: Diagnosis not present

## 2018-01-27 DIAGNOSIS — F17220 Nicotine dependence, chewing tobacco, uncomplicated: Secondary | ICD-10-CM | POA: Diagnosis present

## 2018-01-27 DIAGNOSIS — E876 Hypokalemia: Secondary | ICD-10-CM | POA: Diagnosis not present

## 2018-01-27 DIAGNOSIS — N2589 Other disorders resulting from impaired renal tubular function: Secondary | ICD-10-CM | POA: Diagnosis not present

## 2018-01-27 DIAGNOSIS — J690 Pneumonitis due to inhalation of food and vomit: Secondary | ICD-10-CM | POA: Diagnosis not present

## 2018-01-27 DIAGNOSIS — M6281 Muscle weakness (generalized): Secondary | ICD-10-CM | POA: Diagnosis not present

## 2018-01-27 DIAGNOSIS — I2510 Atherosclerotic heart disease of native coronary artery without angina pectoris: Secondary | ICD-10-CM | POA: Diagnosis not present

## 2018-01-27 DIAGNOSIS — N390 Urinary tract infection, site not specified: Secondary | ICD-10-CM | POA: Diagnosis present

## 2018-01-27 DIAGNOSIS — J9811 Atelectasis: Secondary | ICD-10-CM | POA: Diagnosis not present

## 2018-01-27 DIAGNOSIS — Z66 Do not resuscitate: Secondary | ICD-10-CM | POA: Diagnosis present

## 2018-01-27 DIAGNOSIS — F319 Bipolar disorder, unspecified: Secondary | ICD-10-CM | POA: Diagnosis present

## 2018-01-27 DIAGNOSIS — E8770 Fluid overload, unspecified: Secondary | ICD-10-CM | POA: Diagnosis not present

## 2018-01-27 DIAGNOSIS — R2232 Localized swelling, mass and lump, left upper limb: Secondary | ICD-10-CM | POA: Diagnosis not present

## 2018-01-27 DIAGNOSIS — K658 Other peritonitis: Secondary | ICD-10-CM | POA: Diagnosis not present

## 2018-01-27 DIAGNOSIS — R1312 Dysphagia, oropharyngeal phase: Secondary | ICD-10-CM | POA: Diagnosis not present

## 2018-01-27 DIAGNOSIS — R410 Disorientation, unspecified: Secondary | ICD-10-CM | POA: Diagnosis not present

## 2018-01-27 DIAGNOSIS — Z8673 Personal history of transient ischemic attack (TIA), and cerebral infarction without residual deficits: Secondary | ICD-10-CM | POA: Diagnosis not present

## 2018-01-27 DIAGNOSIS — J9601 Acute respiratory failure with hypoxia: Secondary | ICD-10-CM | POA: Diagnosis not present

## 2018-01-27 DIAGNOSIS — H02209 Unspecified lagophthalmos unspecified eye, unspecified eyelid: Secondary | ICD-10-CM | POA: Diagnosis not present

## 2018-01-27 DIAGNOSIS — N186 End stage renal disease: Secondary | ICD-10-CM | POA: Diagnosis not present

## 2018-01-27 DIAGNOSIS — E785 Hyperlipidemia, unspecified: Secondary | ICD-10-CM | POA: Diagnosis present

## 2018-01-27 DIAGNOSIS — Z915 Personal history of self-harm: Secondary | ICD-10-CM | POA: Diagnosis not present

## 2018-01-27 DIAGNOSIS — E440 Moderate protein-calorie malnutrition: Secondary | ICD-10-CM | POA: Diagnosis not present

## 2018-01-28 DIAGNOSIS — N186 End stage renal disease: Secondary | ICD-10-CM | POA: Diagnosis not present

## 2018-01-28 DIAGNOSIS — Z79899 Other long term (current) drug therapy: Secondary | ICD-10-CM | POA: Diagnosis not present

## 2018-01-28 DIAGNOSIS — R17 Unspecified jaundice: Secondary | ICD-10-CM | POA: Diagnosis not present

## 2018-01-28 DIAGNOSIS — D631 Anemia in chronic kidney disease: Secondary | ICD-10-CM | POA: Diagnosis not present

## 2018-01-28 DIAGNOSIS — E440 Moderate protein-calorie malnutrition: Secondary | ICD-10-CM | POA: Diagnosis not present

## 2018-01-28 DIAGNOSIS — N2581 Secondary hyperparathyroidism of renal origin: Secondary | ICD-10-CM | POA: Diagnosis not present

## 2018-01-29 DIAGNOSIS — N186 End stage renal disease: Secondary | ICD-10-CM | POA: Diagnosis not present

## 2018-01-29 DIAGNOSIS — D631 Anemia in chronic kidney disease: Secondary | ICD-10-CM | POA: Diagnosis not present

## 2018-01-29 DIAGNOSIS — Z79899 Other long term (current) drug therapy: Secondary | ICD-10-CM | POA: Diagnosis not present

## 2018-01-29 DIAGNOSIS — D509 Iron deficiency anemia, unspecified: Secondary | ICD-10-CM | POA: Diagnosis not present

## 2018-01-29 DIAGNOSIS — N2589 Other disorders resulting from impaired renal tubular function: Secondary | ICD-10-CM | POA: Diagnosis not present

## 2018-01-29 DIAGNOSIS — N2581 Secondary hyperparathyroidism of renal origin: Secondary | ICD-10-CM | POA: Diagnosis not present

## 2018-01-30 DIAGNOSIS — Z79899 Other long term (current) drug therapy: Secondary | ICD-10-CM | POA: Diagnosis not present

## 2018-01-30 DIAGNOSIS — N2581 Secondary hyperparathyroidism of renal origin: Secondary | ICD-10-CM | POA: Diagnosis not present

## 2018-01-30 DIAGNOSIS — N186 End stage renal disease: Secondary | ICD-10-CM | POA: Diagnosis not present

## 2018-01-30 DIAGNOSIS — D631 Anemia in chronic kidney disease: Secondary | ICD-10-CM | POA: Diagnosis not present

## 2018-01-30 DIAGNOSIS — N2589 Other disorders resulting from impaired renal tubular function: Secondary | ICD-10-CM | POA: Diagnosis not present

## 2018-01-30 DIAGNOSIS — D509 Iron deficiency anemia, unspecified: Secondary | ICD-10-CM | POA: Diagnosis not present

## 2018-01-31 DIAGNOSIS — N2589 Other disorders resulting from impaired renal tubular function: Secondary | ICD-10-CM | POA: Diagnosis not present

## 2018-01-31 DIAGNOSIS — D509 Iron deficiency anemia, unspecified: Secondary | ICD-10-CM | POA: Diagnosis not present

## 2018-01-31 DIAGNOSIS — Z79899 Other long term (current) drug therapy: Secondary | ICD-10-CM | POA: Diagnosis not present

## 2018-01-31 DIAGNOSIS — N2581 Secondary hyperparathyroidism of renal origin: Secondary | ICD-10-CM | POA: Diagnosis not present

## 2018-01-31 DIAGNOSIS — N186 End stage renal disease: Secondary | ICD-10-CM | POA: Diagnosis not present

## 2018-01-31 DIAGNOSIS — D631 Anemia in chronic kidney disease: Secondary | ICD-10-CM | POA: Diagnosis not present

## 2018-02-01 DIAGNOSIS — Z79899 Other long term (current) drug therapy: Secondary | ICD-10-CM | POA: Diagnosis not present

## 2018-02-01 DIAGNOSIS — D509 Iron deficiency anemia, unspecified: Secondary | ICD-10-CM | POA: Diagnosis not present

## 2018-02-01 DIAGNOSIS — D631 Anemia in chronic kidney disease: Secondary | ICD-10-CM | POA: Diagnosis not present

## 2018-02-01 DIAGNOSIS — N2589 Other disorders resulting from impaired renal tubular function: Secondary | ICD-10-CM | POA: Diagnosis not present

## 2018-02-01 DIAGNOSIS — N186 End stage renal disease: Secondary | ICD-10-CM | POA: Diagnosis not present

## 2018-02-01 DIAGNOSIS — N2581 Secondary hyperparathyroidism of renal origin: Secondary | ICD-10-CM | POA: Diagnosis not present

## 2018-02-02 DIAGNOSIS — N186 End stage renal disease: Secondary | ICD-10-CM | POA: Diagnosis not present

## 2018-02-02 DIAGNOSIS — D631 Anemia in chronic kidney disease: Secondary | ICD-10-CM | POA: Diagnosis not present

## 2018-02-02 DIAGNOSIS — N2581 Secondary hyperparathyroidism of renal origin: Secondary | ICD-10-CM | POA: Diagnosis not present

## 2018-02-02 DIAGNOSIS — N2589 Other disorders resulting from impaired renal tubular function: Secondary | ICD-10-CM | POA: Diagnosis not present

## 2018-02-02 DIAGNOSIS — Z79899 Other long term (current) drug therapy: Secondary | ICD-10-CM | POA: Diagnosis not present

## 2018-02-02 DIAGNOSIS — D509 Iron deficiency anemia, unspecified: Secondary | ICD-10-CM | POA: Diagnosis not present

## 2018-02-03 DIAGNOSIS — Z79899 Other long term (current) drug therapy: Secondary | ICD-10-CM | POA: Diagnosis not present

## 2018-02-03 DIAGNOSIS — N2589 Other disorders resulting from impaired renal tubular function: Secondary | ICD-10-CM | POA: Diagnosis not present

## 2018-02-03 DIAGNOSIS — D509 Iron deficiency anemia, unspecified: Secondary | ICD-10-CM | POA: Diagnosis not present

## 2018-02-03 DIAGNOSIS — D631 Anemia in chronic kidney disease: Secondary | ICD-10-CM | POA: Diagnosis not present

## 2018-02-03 DIAGNOSIS — N186 End stage renal disease: Secondary | ICD-10-CM | POA: Diagnosis not present

## 2018-02-03 DIAGNOSIS — N2581 Secondary hyperparathyroidism of renal origin: Secondary | ICD-10-CM | POA: Diagnosis not present

## 2018-02-04 DIAGNOSIS — N2581 Secondary hyperparathyroidism of renal origin: Secondary | ICD-10-CM | POA: Diagnosis not present

## 2018-02-04 DIAGNOSIS — Z79899 Other long term (current) drug therapy: Secondary | ICD-10-CM | POA: Diagnosis not present

## 2018-02-04 DIAGNOSIS — N186 End stage renal disease: Secondary | ICD-10-CM | POA: Diagnosis not present

## 2018-02-04 DIAGNOSIS — D509 Iron deficiency anemia, unspecified: Secondary | ICD-10-CM | POA: Diagnosis not present

## 2018-02-04 DIAGNOSIS — N2589 Other disorders resulting from impaired renal tubular function: Secondary | ICD-10-CM | POA: Diagnosis not present

## 2018-02-04 DIAGNOSIS — D631 Anemia in chronic kidney disease: Secondary | ICD-10-CM | POA: Diagnosis not present

## 2018-02-05 DIAGNOSIS — D509 Iron deficiency anemia, unspecified: Secondary | ICD-10-CM | POA: Diagnosis not present

## 2018-02-05 DIAGNOSIS — Z79899 Other long term (current) drug therapy: Secondary | ICD-10-CM | POA: Diagnosis not present

## 2018-02-05 DIAGNOSIS — N2589 Other disorders resulting from impaired renal tubular function: Secondary | ICD-10-CM | POA: Diagnosis not present

## 2018-02-05 DIAGNOSIS — N2581 Secondary hyperparathyroidism of renal origin: Secondary | ICD-10-CM | POA: Diagnosis not present

## 2018-02-05 DIAGNOSIS — D631 Anemia in chronic kidney disease: Secondary | ICD-10-CM | POA: Diagnosis not present

## 2018-02-05 DIAGNOSIS — N186 End stage renal disease: Secondary | ICD-10-CM | POA: Diagnosis not present

## 2018-02-06 DIAGNOSIS — D509 Iron deficiency anemia, unspecified: Secondary | ICD-10-CM | POA: Diagnosis not present

## 2018-02-06 DIAGNOSIS — N186 End stage renal disease: Secondary | ICD-10-CM | POA: Diagnosis not present

## 2018-02-06 DIAGNOSIS — N2589 Other disorders resulting from impaired renal tubular function: Secondary | ICD-10-CM | POA: Diagnosis not present

## 2018-02-06 DIAGNOSIS — Z79899 Other long term (current) drug therapy: Secondary | ICD-10-CM | POA: Diagnosis not present

## 2018-02-06 DIAGNOSIS — N2581 Secondary hyperparathyroidism of renal origin: Secondary | ICD-10-CM | POA: Diagnosis not present

## 2018-02-06 DIAGNOSIS — D631 Anemia in chronic kidney disease: Secondary | ICD-10-CM | POA: Diagnosis not present

## 2018-02-07 DIAGNOSIS — D631 Anemia in chronic kidney disease: Secondary | ICD-10-CM | POA: Diagnosis not present

## 2018-02-07 DIAGNOSIS — N2589 Other disorders resulting from impaired renal tubular function: Secondary | ICD-10-CM | POA: Diagnosis not present

## 2018-02-07 DIAGNOSIS — Z79899 Other long term (current) drug therapy: Secondary | ICD-10-CM | POA: Diagnosis not present

## 2018-02-07 DIAGNOSIS — N186 End stage renal disease: Secondary | ICD-10-CM | POA: Diagnosis not present

## 2018-02-07 DIAGNOSIS — D509 Iron deficiency anemia, unspecified: Secondary | ICD-10-CM | POA: Diagnosis not present

## 2018-02-07 DIAGNOSIS — N2581 Secondary hyperparathyroidism of renal origin: Secondary | ICD-10-CM | POA: Diagnosis not present

## 2018-02-08 DIAGNOSIS — D631 Anemia in chronic kidney disease: Secondary | ICD-10-CM | POA: Diagnosis not present

## 2018-02-08 DIAGNOSIS — D509 Iron deficiency anemia, unspecified: Secondary | ICD-10-CM | POA: Diagnosis not present

## 2018-02-08 DIAGNOSIS — N2581 Secondary hyperparathyroidism of renal origin: Secondary | ICD-10-CM | POA: Diagnosis not present

## 2018-02-08 DIAGNOSIS — Z79899 Other long term (current) drug therapy: Secondary | ICD-10-CM | POA: Diagnosis not present

## 2018-02-08 DIAGNOSIS — N186 End stage renal disease: Secondary | ICD-10-CM | POA: Diagnosis not present

## 2018-02-08 DIAGNOSIS — N2589 Other disorders resulting from impaired renal tubular function: Secondary | ICD-10-CM | POA: Diagnosis not present

## 2018-02-09 DIAGNOSIS — N186 End stage renal disease: Secondary | ICD-10-CM | POA: Diagnosis not present

## 2018-02-09 DIAGNOSIS — N2589 Other disorders resulting from impaired renal tubular function: Secondary | ICD-10-CM | POA: Diagnosis not present

## 2018-02-09 DIAGNOSIS — N2581 Secondary hyperparathyroidism of renal origin: Secondary | ICD-10-CM | POA: Diagnosis not present

## 2018-02-09 DIAGNOSIS — D631 Anemia in chronic kidney disease: Secondary | ICD-10-CM | POA: Diagnosis not present

## 2018-02-09 DIAGNOSIS — Z79899 Other long term (current) drug therapy: Secondary | ICD-10-CM | POA: Diagnosis not present

## 2018-02-09 DIAGNOSIS — D509 Iron deficiency anemia, unspecified: Secondary | ICD-10-CM | POA: Diagnosis not present

## 2018-02-10 DIAGNOSIS — D509 Iron deficiency anemia, unspecified: Secondary | ICD-10-CM | POA: Diagnosis not present

## 2018-02-10 DIAGNOSIS — N186 End stage renal disease: Secondary | ICD-10-CM | POA: Diagnosis not present

## 2018-02-10 DIAGNOSIS — Z79899 Other long term (current) drug therapy: Secondary | ICD-10-CM | POA: Diagnosis not present

## 2018-02-10 DIAGNOSIS — N2581 Secondary hyperparathyroidism of renal origin: Secondary | ICD-10-CM | POA: Diagnosis not present

## 2018-02-10 DIAGNOSIS — D631 Anemia in chronic kidney disease: Secondary | ICD-10-CM | POA: Diagnosis not present

## 2018-02-10 DIAGNOSIS — N2589 Other disorders resulting from impaired renal tubular function: Secondary | ICD-10-CM | POA: Diagnosis not present

## 2018-02-11 DIAGNOSIS — N186 End stage renal disease: Secondary | ICD-10-CM | POA: Diagnosis not present

## 2018-02-11 DIAGNOSIS — D509 Iron deficiency anemia, unspecified: Secondary | ICD-10-CM | POA: Diagnosis not present

## 2018-02-11 DIAGNOSIS — Z79899 Other long term (current) drug therapy: Secondary | ICD-10-CM | POA: Diagnosis not present

## 2018-02-11 DIAGNOSIS — D631 Anemia in chronic kidney disease: Secondary | ICD-10-CM | POA: Diagnosis not present

## 2018-02-11 DIAGNOSIS — N2589 Other disorders resulting from impaired renal tubular function: Secondary | ICD-10-CM | POA: Diagnosis not present

## 2018-02-11 DIAGNOSIS — N2581 Secondary hyperparathyroidism of renal origin: Secondary | ICD-10-CM | POA: Diagnosis not present

## 2018-02-12 DIAGNOSIS — Z79899 Other long term (current) drug therapy: Secondary | ICD-10-CM | POA: Diagnosis not present

## 2018-02-12 DIAGNOSIS — N2581 Secondary hyperparathyroidism of renal origin: Secondary | ICD-10-CM | POA: Diagnosis not present

## 2018-02-12 DIAGNOSIS — N2589 Other disorders resulting from impaired renal tubular function: Secondary | ICD-10-CM | POA: Diagnosis not present

## 2018-02-12 DIAGNOSIS — D509 Iron deficiency anemia, unspecified: Secondary | ICD-10-CM | POA: Diagnosis not present

## 2018-02-12 DIAGNOSIS — N186 End stage renal disease: Secondary | ICD-10-CM | POA: Diagnosis not present

## 2018-02-12 DIAGNOSIS — D631 Anemia in chronic kidney disease: Secondary | ICD-10-CM | POA: Diagnosis not present

## 2018-02-13 DIAGNOSIS — Z79899 Other long term (current) drug therapy: Secondary | ICD-10-CM | POA: Diagnosis not present

## 2018-02-13 DIAGNOSIS — N2589 Other disorders resulting from impaired renal tubular function: Secondary | ICD-10-CM | POA: Diagnosis not present

## 2018-02-13 DIAGNOSIS — N186 End stage renal disease: Secondary | ICD-10-CM | POA: Diagnosis not present

## 2018-02-13 DIAGNOSIS — N2581 Secondary hyperparathyroidism of renal origin: Secondary | ICD-10-CM | POA: Diagnosis not present

## 2018-02-13 DIAGNOSIS — D509 Iron deficiency anemia, unspecified: Secondary | ICD-10-CM | POA: Diagnosis not present

## 2018-02-13 DIAGNOSIS — D631 Anemia in chronic kidney disease: Secondary | ICD-10-CM | POA: Diagnosis not present

## 2018-02-14 DIAGNOSIS — D509 Iron deficiency anemia, unspecified: Secondary | ICD-10-CM | POA: Diagnosis not present

## 2018-02-14 DIAGNOSIS — D631 Anemia in chronic kidney disease: Secondary | ICD-10-CM | POA: Diagnosis not present

## 2018-02-14 DIAGNOSIS — N186 End stage renal disease: Secondary | ICD-10-CM | POA: Diagnosis not present

## 2018-02-14 DIAGNOSIS — Z79899 Other long term (current) drug therapy: Secondary | ICD-10-CM | POA: Diagnosis not present

## 2018-02-14 DIAGNOSIS — N2589 Other disorders resulting from impaired renal tubular function: Secondary | ICD-10-CM | POA: Diagnosis not present

## 2018-02-14 DIAGNOSIS — N2581 Secondary hyperparathyroidism of renal origin: Secondary | ICD-10-CM | POA: Diagnosis not present

## 2018-02-15 DIAGNOSIS — D509 Iron deficiency anemia, unspecified: Secondary | ICD-10-CM | POA: Diagnosis not present

## 2018-02-15 DIAGNOSIS — N2589 Other disorders resulting from impaired renal tubular function: Secondary | ICD-10-CM | POA: Diagnosis not present

## 2018-02-15 DIAGNOSIS — N186 End stage renal disease: Secondary | ICD-10-CM | POA: Diagnosis not present

## 2018-02-15 DIAGNOSIS — N2581 Secondary hyperparathyroidism of renal origin: Secondary | ICD-10-CM | POA: Diagnosis not present

## 2018-02-15 DIAGNOSIS — Z79899 Other long term (current) drug therapy: Secondary | ICD-10-CM | POA: Diagnosis not present

## 2018-02-15 DIAGNOSIS — D631 Anemia in chronic kidney disease: Secondary | ICD-10-CM | POA: Diagnosis not present

## 2018-02-16 DIAGNOSIS — F319 Bipolar disorder, unspecified: Secondary | ICD-10-CM | POA: Diagnosis not present

## 2018-02-16 DIAGNOSIS — Z7982 Long term (current) use of aspirin: Secondary | ICD-10-CM | POA: Diagnosis not present

## 2018-02-16 DIAGNOSIS — N2589 Other disorders resulting from impaired renal tubular function: Secondary | ICD-10-CM | POA: Diagnosis not present

## 2018-02-16 DIAGNOSIS — F209 Schizophrenia, unspecified: Secondary | ICD-10-CM | POA: Diagnosis not present

## 2018-02-16 DIAGNOSIS — I472 Ventricular tachycardia: Secondary | ICD-10-CM | POA: Diagnosis not present

## 2018-02-16 DIAGNOSIS — Z79899 Other long term (current) drug therapy: Secondary | ICD-10-CM | POA: Diagnosis not present

## 2018-02-16 DIAGNOSIS — Z9089 Acquired absence of other organs: Secondary | ICD-10-CM | POA: Diagnosis not present

## 2018-02-16 DIAGNOSIS — H16002 Unspecified corneal ulcer, left eye: Secondary | ICD-10-CM | POA: Diagnosis not present

## 2018-02-16 DIAGNOSIS — H9190 Unspecified hearing loss, unspecified ear: Secondary | ICD-10-CM | POA: Diagnosis not present

## 2018-02-16 DIAGNOSIS — D631 Anemia in chronic kidney disease: Secondary | ICD-10-CM | POA: Diagnosis not present

## 2018-02-16 DIAGNOSIS — Z8673 Personal history of transient ischemic attack (TIA), and cerebral infarction without residual deficits: Secondary | ICD-10-CM | POA: Diagnosis not present

## 2018-02-16 DIAGNOSIS — Z48812 Encounter for surgical aftercare following surgery on the circulatory system: Secondary | ICD-10-CM | POA: Diagnosis not present

## 2018-02-16 DIAGNOSIS — N186 End stage renal disease: Secondary | ICD-10-CM | POA: Diagnosis not present

## 2018-02-16 DIAGNOSIS — I9589 Other hypotension: Secondary | ICD-10-CM | POA: Diagnosis not present

## 2018-02-16 DIAGNOSIS — B9689 Other specified bacterial agents as the cause of diseases classified elsewhere: Secondary | ICD-10-CM | POA: Diagnosis not present

## 2018-02-16 DIAGNOSIS — N2581 Secondary hyperparathyroidism of renal origin: Secondary | ICD-10-CM | POA: Diagnosis not present

## 2018-02-16 DIAGNOSIS — L03114 Cellulitis of left upper limb: Secondary | ICD-10-CM | POA: Diagnosis not present

## 2018-02-16 DIAGNOSIS — D509 Iron deficiency anemia, unspecified: Secondary | ICD-10-CM | POA: Diagnosis not present

## 2018-02-16 DIAGNOSIS — Z87440 Personal history of urinary (tract) infections: Secondary | ICD-10-CM | POA: Diagnosis not present

## 2018-02-16 DIAGNOSIS — E785 Hyperlipidemia, unspecified: Secondary | ICD-10-CM | POA: Diagnosis not present

## 2018-02-16 DIAGNOSIS — I120 Hypertensive chronic kidney disease with stage 5 chronic kidney disease or end stage renal disease: Secondary | ICD-10-CM | POA: Diagnosis not present

## 2018-02-16 DIAGNOSIS — R1310 Dysphagia, unspecified: Secondary | ICD-10-CM | POA: Diagnosis not present

## 2018-02-16 DIAGNOSIS — K900 Celiac disease: Secondary | ICD-10-CM | POA: Diagnosis not present

## 2018-02-16 DIAGNOSIS — Z992 Dependence on renal dialysis: Secondary | ICD-10-CM | POA: Diagnosis not present

## 2018-02-16 DIAGNOSIS — E039 Hypothyroidism, unspecified: Secondary | ICD-10-CM | POA: Diagnosis not present

## 2018-02-16 DIAGNOSIS — R680 Hypothermia, not associated with low environmental temperature: Secondary | ICD-10-CM | POA: Diagnosis not present

## 2018-02-16 DIAGNOSIS — B957 Other staphylococcus as the cause of diseases classified elsewhere: Secondary | ICD-10-CM | POA: Diagnosis not present

## 2018-02-16 DIAGNOSIS — Z95820 Peripheral vascular angioplasty status with implants and grafts: Secondary | ICD-10-CM | POA: Diagnosis not present

## 2018-02-16 DIAGNOSIS — Z8701 Personal history of pneumonia (recurrent): Secondary | ICD-10-CM | POA: Diagnosis not present

## 2018-02-17 DIAGNOSIS — D509 Iron deficiency anemia, unspecified: Secondary | ICD-10-CM | POA: Diagnosis not present

## 2018-02-17 DIAGNOSIS — N2589 Other disorders resulting from impaired renal tubular function: Secondary | ICD-10-CM | POA: Diagnosis not present

## 2018-02-17 DIAGNOSIS — D631 Anemia in chronic kidney disease: Secondary | ICD-10-CM | POA: Diagnosis not present

## 2018-02-17 DIAGNOSIS — R82998 Other abnormal findings in urine: Secondary | ICD-10-CM | POA: Diagnosis not present

## 2018-02-17 DIAGNOSIS — N186 End stage renal disease: Secondary | ICD-10-CM | POA: Diagnosis not present

## 2018-02-17 DIAGNOSIS — E7849 Other hyperlipidemia: Secondary | ICD-10-CM | POA: Diagnosis not present

## 2018-02-17 DIAGNOSIS — Z79899 Other long term (current) drug therapy: Secondary | ICD-10-CM | POA: Diagnosis not present

## 2018-02-17 DIAGNOSIS — N2581 Secondary hyperparathyroidism of renal origin: Secondary | ICD-10-CM | POA: Diagnosis not present

## 2018-02-18 DIAGNOSIS — N2581 Secondary hyperparathyroidism of renal origin: Secondary | ICD-10-CM | POA: Diagnosis not present

## 2018-02-18 DIAGNOSIS — D509 Iron deficiency anemia, unspecified: Secondary | ICD-10-CM | POA: Diagnosis not present

## 2018-02-18 DIAGNOSIS — N2589 Other disorders resulting from impaired renal tubular function: Secondary | ICD-10-CM | POA: Diagnosis not present

## 2018-02-18 DIAGNOSIS — D631 Anemia in chronic kidney disease: Secondary | ICD-10-CM | POA: Diagnosis not present

## 2018-02-18 DIAGNOSIS — N186 End stage renal disease: Secondary | ICD-10-CM | POA: Diagnosis not present

## 2018-02-18 DIAGNOSIS — Z79899 Other long term (current) drug therapy: Secondary | ICD-10-CM | POA: Diagnosis not present

## 2018-02-19 DIAGNOSIS — N2581 Secondary hyperparathyroidism of renal origin: Secondary | ICD-10-CM | POA: Diagnosis not present

## 2018-02-19 DIAGNOSIS — B957 Other staphylococcus as the cause of diseases classified elsewhere: Secondary | ICD-10-CM | POA: Diagnosis not present

## 2018-02-19 DIAGNOSIS — Z48812 Encounter for surgical aftercare following surgery on the circulatory system: Secondary | ICD-10-CM | POA: Diagnosis not present

## 2018-02-19 DIAGNOSIS — L03114 Cellulitis of left upper limb: Secondary | ICD-10-CM | POA: Diagnosis not present

## 2018-02-19 DIAGNOSIS — N186 End stage renal disease: Secondary | ICD-10-CM | POA: Diagnosis not present

## 2018-02-19 DIAGNOSIS — D631 Anemia in chronic kidney disease: Secondary | ICD-10-CM | POA: Diagnosis not present

## 2018-02-19 DIAGNOSIS — N2589 Other disorders resulting from impaired renal tubular function: Secondary | ICD-10-CM | POA: Diagnosis not present

## 2018-02-19 DIAGNOSIS — H16002 Unspecified corneal ulcer, left eye: Secondary | ICD-10-CM | POA: Diagnosis not present

## 2018-02-19 DIAGNOSIS — B9689 Other specified bacterial agents as the cause of diseases classified elsewhere: Secondary | ICD-10-CM | POA: Diagnosis not present

## 2018-02-19 DIAGNOSIS — D509 Iron deficiency anemia, unspecified: Secondary | ICD-10-CM | POA: Diagnosis not present

## 2018-02-19 DIAGNOSIS — Z79899 Other long term (current) drug therapy: Secondary | ICD-10-CM | POA: Diagnosis not present

## 2018-02-19 DIAGNOSIS — I9589 Other hypotension: Secondary | ICD-10-CM | POA: Diagnosis not present

## 2018-02-20 DIAGNOSIS — N2589 Other disorders resulting from impaired renal tubular function: Secondary | ICD-10-CM | POA: Diagnosis not present

## 2018-02-20 DIAGNOSIS — D631 Anemia in chronic kidney disease: Secondary | ICD-10-CM | POA: Diagnosis not present

## 2018-02-20 DIAGNOSIS — N186 End stage renal disease: Secondary | ICD-10-CM | POA: Diagnosis not present

## 2018-02-20 DIAGNOSIS — Z79899 Other long term (current) drug therapy: Secondary | ICD-10-CM | POA: Diagnosis not present

## 2018-02-20 DIAGNOSIS — N2581 Secondary hyperparathyroidism of renal origin: Secondary | ICD-10-CM | POA: Diagnosis not present

## 2018-02-20 DIAGNOSIS — D509 Iron deficiency anemia, unspecified: Secondary | ICD-10-CM | POA: Diagnosis not present

## 2018-02-21 DIAGNOSIS — Z79899 Other long term (current) drug therapy: Secondary | ICD-10-CM | POA: Diagnosis not present

## 2018-02-21 DIAGNOSIS — D631 Anemia in chronic kidney disease: Secondary | ICD-10-CM | POA: Diagnosis not present

## 2018-02-21 DIAGNOSIS — D509 Iron deficiency anemia, unspecified: Secondary | ICD-10-CM | POA: Diagnosis not present

## 2018-02-21 DIAGNOSIS — N186 End stage renal disease: Secondary | ICD-10-CM | POA: Diagnosis not present

## 2018-02-21 DIAGNOSIS — N2589 Other disorders resulting from impaired renal tubular function: Secondary | ICD-10-CM | POA: Diagnosis not present

## 2018-02-21 DIAGNOSIS — N2581 Secondary hyperparathyroidism of renal origin: Secondary | ICD-10-CM | POA: Diagnosis not present

## 2018-02-22 DIAGNOSIS — D631 Anemia in chronic kidney disease: Secondary | ICD-10-CM | POA: Diagnosis not present

## 2018-02-22 DIAGNOSIS — N186 End stage renal disease: Secondary | ICD-10-CM | POA: Diagnosis not present

## 2018-02-22 DIAGNOSIS — D509 Iron deficiency anemia, unspecified: Secondary | ICD-10-CM | POA: Diagnosis not present

## 2018-02-22 DIAGNOSIS — Z79899 Other long term (current) drug therapy: Secondary | ICD-10-CM | POA: Diagnosis not present

## 2018-02-22 DIAGNOSIS — N2581 Secondary hyperparathyroidism of renal origin: Secondary | ICD-10-CM | POA: Diagnosis not present

## 2018-02-22 DIAGNOSIS — N2589 Other disorders resulting from impaired renal tubular function: Secondary | ICD-10-CM | POA: Diagnosis not present

## 2018-02-23 DIAGNOSIS — N2581 Secondary hyperparathyroidism of renal origin: Secondary | ICD-10-CM | POA: Diagnosis not present

## 2018-02-23 DIAGNOSIS — N186 End stage renal disease: Secondary | ICD-10-CM | POA: Diagnosis not present

## 2018-02-23 DIAGNOSIS — D631 Anemia in chronic kidney disease: Secondary | ICD-10-CM | POA: Diagnosis not present

## 2018-02-23 DIAGNOSIS — N2589 Other disorders resulting from impaired renal tubular function: Secondary | ICD-10-CM | POA: Diagnosis not present

## 2018-02-23 DIAGNOSIS — D509 Iron deficiency anemia, unspecified: Secondary | ICD-10-CM | POA: Diagnosis not present

## 2018-02-23 DIAGNOSIS — Z79899 Other long term (current) drug therapy: Secondary | ICD-10-CM | POA: Diagnosis not present

## 2018-02-24 DIAGNOSIS — B957 Other staphylococcus as the cause of diseases classified elsewhere: Secondary | ICD-10-CM | POA: Diagnosis not present

## 2018-02-24 DIAGNOSIS — I9589 Other hypotension: Secondary | ICD-10-CM | POA: Diagnosis not present

## 2018-02-24 DIAGNOSIS — N2589 Other disorders resulting from impaired renal tubular function: Secondary | ICD-10-CM | POA: Diagnosis not present

## 2018-02-24 DIAGNOSIS — H16002 Unspecified corneal ulcer, left eye: Secondary | ICD-10-CM | POA: Diagnosis not present

## 2018-02-24 DIAGNOSIS — Z48812 Encounter for surgical aftercare following surgery on the circulatory system: Secondary | ICD-10-CM | POA: Diagnosis not present

## 2018-02-24 DIAGNOSIS — N2581 Secondary hyperparathyroidism of renal origin: Secondary | ICD-10-CM | POA: Diagnosis not present

## 2018-02-24 DIAGNOSIS — L03114 Cellulitis of left upper limb: Secondary | ICD-10-CM | POA: Diagnosis not present

## 2018-02-24 DIAGNOSIS — D509 Iron deficiency anemia, unspecified: Secondary | ICD-10-CM | POA: Diagnosis not present

## 2018-02-24 DIAGNOSIS — D631 Anemia in chronic kidney disease: Secondary | ICD-10-CM | POA: Diagnosis not present

## 2018-02-24 DIAGNOSIS — N186 End stage renal disease: Secondary | ICD-10-CM | POA: Diagnosis not present

## 2018-02-24 DIAGNOSIS — B9689 Other specified bacterial agents as the cause of diseases classified elsewhere: Secondary | ICD-10-CM | POA: Diagnosis not present

## 2018-02-24 DIAGNOSIS — Z79899 Other long term (current) drug therapy: Secondary | ICD-10-CM | POA: Diagnosis not present

## 2018-02-25 DIAGNOSIS — N2581 Secondary hyperparathyroidism of renal origin: Secondary | ICD-10-CM | POA: Diagnosis not present

## 2018-02-25 DIAGNOSIS — N186 End stage renal disease: Secondary | ICD-10-CM | POA: Diagnosis not present

## 2018-02-25 DIAGNOSIS — N2589 Other disorders resulting from impaired renal tubular function: Secondary | ICD-10-CM | POA: Diagnosis not present

## 2018-02-25 DIAGNOSIS — D509 Iron deficiency anemia, unspecified: Secondary | ICD-10-CM | POA: Diagnosis not present

## 2018-02-25 DIAGNOSIS — D631 Anemia in chronic kidney disease: Secondary | ICD-10-CM | POA: Diagnosis not present

## 2018-02-25 DIAGNOSIS — Z79899 Other long term (current) drug therapy: Secondary | ICD-10-CM | POA: Diagnosis not present

## 2018-02-26 DIAGNOSIS — Z79899 Other long term (current) drug therapy: Secondary | ICD-10-CM | POA: Diagnosis not present

## 2018-02-26 DIAGNOSIS — N186 End stage renal disease: Secondary | ICD-10-CM | POA: Diagnosis not present

## 2018-02-26 DIAGNOSIS — N2581 Secondary hyperparathyroidism of renal origin: Secondary | ICD-10-CM | POA: Diagnosis not present

## 2018-02-26 DIAGNOSIS — D509 Iron deficiency anemia, unspecified: Secondary | ICD-10-CM | POA: Diagnosis not present

## 2018-02-26 DIAGNOSIS — D631 Anemia in chronic kidney disease: Secondary | ICD-10-CM | POA: Diagnosis not present

## 2018-02-26 DIAGNOSIS — N2589 Other disorders resulting from impaired renal tubular function: Secondary | ICD-10-CM | POA: Diagnosis not present

## 2018-02-27 DIAGNOSIS — N2581 Secondary hyperparathyroidism of renal origin: Secondary | ICD-10-CM | POA: Diagnosis not present

## 2018-02-27 DIAGNOSIS — D631 Anemia in chronic kidney disease: Secondary | ICD-10-CM | POA: Diagnosis not present

## 2018-02-27 DIAGNOSIS — N186 End stage renal disease: Secondary | ICD-10-CM | POA: Diagnosis not present

## 2018-02-27 DIAGNOSIS — D509 Iron deficiency anemia, unspecified: Secondary | ICD-10-CM | POA: Diagnosis not present

## 2018-02-27 DIAGNOSIS — Z79899 Other long term (current) drug therapy: Secondary | ICD-10-CM | POA: Diagnosis not present

## 2018-02-27 DIAGNOSIS — N2589 Other disorders resulting from impaired renal tubular function: Secondary | ICD-10-CM | POA: Diagnosis not present

## 2018-02-28 ENCOUNTER — Ambulatory Visit (INDEPENDENT_AMBULATORY_CARE_PROVIDER_SITE_OTHER): Payer: Medicare Other

## 2018-02-28 DIAGNOSIS — I472 Ventricular tachycardia: Secondary | ICD-10-CM

## 2018-02-28 DIAGNOSIS — Z48812 Encounter for surgical aftercare following surgery on the circulatory system: Secondary | ICD-10-CM

## 2018-02-28 DIAGNOSIS — D631 Anemia in chronic kidney disease: Secondary | ICD-10-CM

## 2018-02-28 DIAGNOSIS — R1310 Dysphagia, unspecified: Secondary | ICD-10-CM | POA: Diagnosis not present

## 2018-02-28 DIAGNOSIS — H16002 Unspecified corneal ulcer, left eye: Secondary | ICD-10-CM

## 2018-02-28 DIAGNOSIS — L03114 Cellulitis of left upper limb: Secondary | ICD-10-CM

## 2018-02-28 DIAGNOSIS — K900 Celiac disease: Secondary | ICD-10-CM

## 2018-02-28 DIAGNOSIS — B9689 Other specified bacterial agents as the cause of diseases classified elsewhere: Secondary | ICD-10-CM

## 2018-02-28 DIAGNOSIS — Z992 Dependence on renal dialysis: Secondary | ICD-10-CM

## 2018-02-28 DIAGNOSIS — D509 Iron deficiency anemia, unspecified: Secondary | ICD-10-CM | POA: Diagnosis not present

## 2018-02-28 DIAGNOSIS — E039 Hypothyroidism, unspecified: Secondary | ICD-10-CM | POA: Diagnosis not present

## 2018-02-28 DIAGNOSIS — B957 Other staphylococcus as the cause of diseases classified elsewhere: Secondary | ICD-10-CM | POA: Diagnosis not present

## 2018-02-28 DIAGNOSIS — Z95820 Peripheral vascular angioplasty status with implants and grafts: Secondary | ICD-10-CM

## 2018-02-28 DIAGNOSIS — Z79899 Other long term (current) drug therapy: Secondary | ICD-10-CM | POA: Diagnosis not present

## 2018-02-28 DIAGNOSIS — R680 Hypothermia, not associated with low environmental temperature: Secondary | ICD-10-CM

## 2018-02-28 DIAGNOSIS — H9190 Unspecified hearing loss, unspecified ear: Secondary | ICD-10-CM

## 2018-02-28 DIAGNOSIS — N2581 Secondary hyperparathyroidism of renal origin: Secondary | ICD-10-CM | POA: Diagnosis not present

## 2018-02-28 DIAGNOSIS — Z7982 Long term (current) use of aspirin: Secondary | ICD-10-CM

## 2018-02-28 DIAGNOSIS — N2589 Other disorders resulting from impaired renal tubular function: Secondary | ICD-10-CM | POA: Diagnosis not present

## 2018-02-28 DIAGNOSIS — I120 Hypertensive chronic kidney disease with stage 5 chronic kidney disease or end stage renal disease: Secondary | ICD-10-CM | POA: Diagnosis not present

## 2018-02-28 DIAGNOSIS — N186 End stage renal disease: Secondary | ICD-10-CM | POA: Diagnosis not present

## 2018-02-28 DIAGNOSIS — I9589 Other hypotension: Secondary | ICD-10-CM | POA: Diagnosis not present

## 2018-02-28 DIAGNOSIS — F319 Bipolar disorder, unspecified: Secondary | ICD-10-CM

## 2018-02-28 DIAGNOSIS — E785 Hyperlipidemia, unspecified: Secondary | ICD-10-CM

## 2018-02-28 DIAGNOSIS — F209 Schizophrenia, unspecified: Secondary | ICD-10-CM

## 2018-02-28 DIAGNOSIS — I7789 Other specified disorders of arteries and arterioles: Secondary | ICD-10-CM | POA: Diagnosis not present

## 2018-03-01 DIAGNOSIS — N2581 Secondary hyperparathyroidism of renal origin: Secondary | ICD-10-CM | POA: Diagnosis not present

## 2018-03-01 DIAGNOSIS — N186 End stage renal disease: Secondary | ICD-10-CM | POA: Diagnosis not present

## 2018-03-01 DIAGNOSIS — D509 Iron deficiency anemia, unspecified: Secondary | ICD-10-CM | POA: Diagnosis not present

## 2018-03-01 DIAGNOSIS — Z79899 Other long term (current) drug therapy: Secondary | ICD-10-CM | POA: Diagnosis not present

## 2018-03-01 DIAGNOSIS — Z4932 Encounter for adequacy testing for peritoneal dialysis: Secondary | ICD-10-CM | POA: Diagnosis not present

## 2018-03-01 DIAGNOSIS — D631 Anemia in chronic kidney disease: Secondary | ICD-10-CM | POA: Diagnosis not present

## 2018-03-01 DIAGNOSIS — E440 Moderate protein-calorie malnutrition: Secondary | ICD-10-CM | POA: Diagnosis not present

## 2018-03-01 DIAGNOSIS — K769 Liver disease, unspecified: Secondary | ICD-10-CM | POA: Diagnosis not present

## 2018-03-01 DIAGNOSIS — I7789 Other specified disorders of arteries and arterioles: Secondary | ICD-10-CM | POA: Diagnosis not present

## 2018-03-01 DIAGNOSIS — Z992 Dependence on renal dialysis: Secondary | ICD-10-CM | POA: Diagnosis not present

## 2018-03-02 DIAGNOSIS — D631 Anemia in chronic kidney disease: Secondary | ICD-10-CM | POA: Diagnosis not present

## 2018-03-02 DIAGNOSIS — D509 Iron deficiency anemia, unspecified: Secondary | ICD-10-CM | POA: Diagnosis not present

## 2018-03-02 DIAGNOSIS — N2581 Secondary hyperparathyroidism of renal origin: Secondary | ICD-10-CM | POA: Diagnosis not present

## 2018-03-02 DIAGNOSIS — N186 End stage renal disease: Secondary | ICD-10-CM | POA: Diagnosis not present

## 2018-03-02 DIAGNOSIS — Z4932 Encounter for adequacy testing for peritoneal dialysis: Secondary | ICD-10-CM | POA: Diagnosis not present

## 2018-03-03 DIAGNOSIS — N2581 Secondary hyperparathyroidism of renal origin: Secondary | ICD-10-CM | POA: Diagnosis not present

## 2018-03-03 DIAGNOSIS — D509 Iron deficiency anemia, unspecified: Secondary | ICD-10-CM | POA: Diagnosis not present

## 2018-03-03 DIAGNOSIS — N186 End stage renal disease: Secondary | ICD-10-CM | POA: Diagnosis not present

## 2018-03-03 DIAGNOSIS — Z4932 Encounter for adequacy testing for peritoneal dialysis: Secondary | ICD-10-CM | POA: Diagnosis not present

## 2018-03-03 DIAGNOSIS — D631 Anemia in chronic kidney disease: Secondary | ICD-10-CM | POA: Diagnosis not present

## 2018-03-04 DIAGNOSIS — Z48812 Encounter for surgical aftercare following surgery on the circulatory system: Secondary | ICD-10-CM | POA: Diagnosis not present

## 2018-03-04 DIAGNOSIS — D509 Iron deficiency anemia, unspecified: Secondary | ICD-10-CM | POA: Diagnosis not present

## 2018-03-04 DIAGNOSIS — D631 Anemia in chronic kidney disease: Secondary | ICD-10-CM | POA: Diagnosis not present

## 2018-03-04 DIAGNOSIS — B957 Other staphylococcus as the cause of diseases classified elsewhere: Secondary | ICD-10-CM | POA: Diagnosis not present

## 2018-03-04 DIAGNOSIS — N2581 Secondary hyperparathyroidism of renal origin: Secondary | ICD-10-CM | POA: Diagnosis not present

## 2018-03-04 DIAGNOSIS — H16002 Unspecified corneal ulcer, left eye: Secondary | ICD-10-CM | POA: Diagnosis not present

## 2018-03-04 DIAGNOSIS — Z4932 Encounter for adequacy testing for peritoneal dialysis: Secondary | ICD-10-CM | POA: Diagnosis not present

## 2018-03-04 DIAGNOSIS — N186 End stage renal disease: Secondary | ICD-10-CM | POA: Diagnosis not present

## 2018-03-04 DIAGNOSIS — B9689 Other specified bacterial agents as the cause of diseases classified elsewhere: Secondary | ICD-10-CM | POA: Diagnosis not present

## 2018-03-04 DIAGNOSIS — L03114 Cellulitis of left upper limb: Secondary | ICD-10-CM | POA: Diagnosis not present

## 2018-03-04 DIAGNOSIS — I9589 Other hypotension: Secondary | ICD-10-CM | POA: Diagnosis not present

## 2018-03-05 DIAGNOSIS — Z4932 Encounter for adequacy testing for peritoneal dialysis: Secondary | ICD-10-CM | POA: Diagnosis not present

## 2018-03-05 DIAGNOSIS — N2581 Secondary hyperparathyroidism of renal origin: Secondary | ICD-10-CM | POA: Diagnosis not present

## 2018-03-05 DIAGNOSIS — D631 Anemia in chronic kidney disease: Secondary | ICD-10-CM | POA: Diagnosis not present

## 2018-03-05 DIAGNOSIS — D509 Iron deficiency anemia, unspecified: Secondary | ICD-10-CM | POA: Diagnosis not present

## 2018-03-05 DIAGNOSIS — N186 End stage renal disease: Secondary | ICD-10-CM | POA: Diagnosis not present

## 2018-03-06 DIAGNOSIS — N2581 Secondary hyperparathyroidism of renal origin: Secondary | ICD-10-CM | POA: Diagnosis not present

## 2018-03-06 DIAGNOSIS — N186 End stage renal disease: Secondary | ICD-10-CM | POA: Diagnosis not present

## 2018-03-06 DIAGNOSIS — Z4932 Encounter for adequacy testing for peritoneal dialysis: Secondary | ICD-10-CM | POA: Diagnosis not present

## 2018-03-06 DIAGNOSIS — D631 Anemia in chronic kidney disease: Secondary | ICD-10-CM | POA: Diagnosis not present

## 2018-03-06 DIAGNOSIS — D509 Iron deficiency anemia, unspecified: Secondary | ICD-10-CM | POA: Diagnosis not present

## 2018-03-07 DIAGNOSIS — N186 End stage renal disease: Secondary | ICD-10-CM | POA: Diagnosis not present

## 2018-03-07 DIAGNOSIS — D509 Iron deficiency anemia, unspecified: Secondary | ICD-10-CM | POA: Diagnosis not present

## 2018-03-07 DIAGNOSIS — D631 Anemia in chronic kidney disease: Secondary | ICD-10-CM | POA: Diagnosis not present

## 2018-03-07 DIAGNOSIS — I9589 Other hypotension: Secondary | ICD-10-CM | POA: Diagnosis not present

## 2018-03-07 DIAGNOSIS — L03114 Cellulitis of left upper limb: Secondary | ICD-10-CM | POA: Diagnosis not present

## 2018-03-07 DIAGNOSIS — B9689 Other specified bacterial agents as the cause of diseases classified elsewhere: Secondary | ICD-10-CM | POA: Diagnosis not present

## 2018-03-07 DIAGNOSIS — R82998 Other abnormal findings in urine: Secondary | ICD-10-CM | POA: Diagnosis not present

## 2018-03-07 DIAGNOSIS — B957 Other staphylococcus as the cause of diseases classified elsewhere: Secondary | ICD-10-CM | POA: Diagnosis not present

## 2018-03-07 DIAGNOSIS — H16002 Unspecified corneal ulcer, left eye: Secondary | ICD-10-CM | POA: Diagnosis not present

## 2018-03-07 DIAGNOSIS — Z48812 Encounter for surgical aftercare following surgery on the circulatory system: Secondary | ICD-10-CM | POA: Diagnosis not present

## 2018-03-07 DIAGNOSIS — N2581 Secondary hyperparathyroidism of renal origin: Secondary | ICD-10-CM | POA: Diagnosis not present

## 2018-03-07 DIAGNOSIS — E7849 Other hyperlipidemia: Secondary | ICD-10-CM | POA: Diagnosis not present

## 2018-03-07 DIAGNOSIS — Z4932 Encounter for adequacy testing for peritoneal dialysis: Secondary | ICD-10-CM | POA: Diagnosis not present

## 2018-03-08 ENCOUNTER — Other Ambulatory Visit: Payer: Self-pay | Admitting: Family Medicine

## 2018-03-08 DIAGNOSIS — N2581 Secondary hyperparathyroidism of renal origin: Secondary | ICD-10-CM | POA: Diagnosis not present

## 2018-03-08 DIAGNOSIS — D631 Anemia in chronic kidney disease: Secondary | ICD-10-CM | POA: Diagnosis not present

## 2018-03-08 DIAGNOSIS — N186 End stage renal disease: Secondary | ICD-10-CM | POA: Diagnosis not present

## 2018-03-08 DIAGNOSIS — D509 Iron deficiency anemia, unspecified: Secondary | ICD-10-CM | POA: Diagnosis not present

## 2018-03-08 DIAGNOSIS — Z4932 Encounter for adequacy testing for peritoneal dialysis: Secondary | ICD-10-CM | POA: Diagnosis not present

## 2018-03-09 DIAGNOSIS — Z4932 Encounter for adequacy testing for peritoneal dialysis: Secondary | ICD-10-CM | POA: Diagnosis not present

## 2018-03-09 DIAGNOSIS — D509 Iron deficiency anemia, unspecified: Secondary | ICD-10-CM | POA: Diagnosis not present

## 2018-03-09 DIAGNOSIS — N2581 Secondary hyperparathyroidism of renal origin: Secondary | ICD-10-CM | POA: Diagnosis not present

## 2018-03-09 DIAGNOSIS — N186 End stage renal disease: Secondary | ICD-10-CM | POA: Diagnosis not present

## 2018-03-09 DIAGNOSIS — D631 Anemia in chronic kidney disease: Secondary | ICD-10-CM | POA: Diagnosis not present

## 2018-03-10 DIAGNOSIS — D631 Anemia in chronic kidney disease: Secondary | ICD-10-CM | POA: Diagnosis not present

## 2018-03-10 DIAGNOSIS — N2581 Secondary hyperparathyroidism of renal origin: Secondary | ICD-10-CM | POA: Diagnosis not present

## 2018-03-10 DIAGNOSIS — N186 End stage renal disease: Secondary | ICD-10-CM | POA: Diagnosis not present

## 2018-03-10 DIAGNOSIS — Z4932 Encounter for adequacy testing for peritoneal dialysis: Secondary | ICD-10-CM | POA: Diagnosis not present

## 2018-03-10 DIAGNOSIS — D509 Iron deficiency anemia, unspecified: Secondary | ICD-10-CM | POA: Diagnosis not present

## 2018-03-11 DIAGNOSIS — D631 Anemia in chronic kidney disease: Secondary | ICD-10-CM | POA: Diagnosis not present

## 2018-03-11 DIAGNOSIS — N2581 Secondary hyperparathyroidism of renal origin: Secondary | ICD-10-CM | POA: Diagnosis not present

## 2018-03-11 DIAGNOSIS — B9689 Other specified bacterial agents as the cause of diseases classified elsewhere: Secondary | ICD-10-CM | POA: Diagnosis not present

## 2018-03-11 DIAGNOSIS — H16002 Unspecified corneal ulcer, left eye: Secondary | ICD-10-CM | POA: Diagnosis not present

## 2018-03-11 DIAGNOSIS — Z48812 Encounter for surgical aftercare following surgery on the circulatory system: Secondary | ICD-10-CM | POA: Diagnosis not present

## 2018-03-11 DIAGNOSIS — B957 Other staphylococcus as the cause of diseases classified elsewhere: Secondary | ICD-10-CM | POA: Diagnosis not present

## 2018-03-11 DIAGNOSIS — L03114 Cellulitis of left upper limb: Secondary | ICD-10-CM | POA: Diagnosis not present

## 2018-03-11 DIAGNOSIS — Z4932 Encounter for adequacy testing for peritoneal dialysis: Secondary | ICD-10-CM | POA: Diagnosis not present

## 2018-03-11 DIAGNOSIS — D509 Iron deficiency anemia, unspecified: Secondary | ICD-10-CM | POA: Diagnosis not present

## 2018-03-11 DIAGNOSIS — N186 End stage renal disease: Secondary | ICD-10-CM | POA: Diagnosis not present

## 2018-03-11 DIAGNOSIS — I9589 Other hypotension: Secondary | ICD-10-CM | POA: Diagnosis not present

## 2018-03-12 DIAGNOSIS — D631 Anemia in chronic kidney disease: Secondary | ICD-10-CM | POA: Diagnosis not present

## 2018-03-12 DIAGNOSIS — N2581 Secondary hyperparathyroidism of renal origin: Secondary | ICD-10-CM | POA: Diagnosis not present

## 2018-03-12 DIAGNOSIS — Z4932 Encounter for adequacy testing for peritoneal dialysis: Secondary | ICD-10-CM | POA: Diagnosis not present

## 2018-03-12 DIAGNOSIS — N186 End stage renal disease: Secondary | ICD-10-CM | POA: Diagnosis not present

## 2018-03-12 DIAGNOSIS — D509 Iron deficiency anemia, unspecified: Secondary | ICD-10-CM | POA: Diagnosis not present

## 2018-03-13 DIAGNOSIS — B9689 Other specified bacterial agents as the cause of diseases classified elsewhere: Secondary | ICD-10-CM | POA: Diagnosis not present

## 2018-03-13 DIAGNOSIS — D509 Iron deficiency anemia, unspecified: Secondary | ICD-10-CM | POA: Diagnosis not present

## 2018-03-13 DIAGNOSIS — N2581 Secondary hyperparathyroidism of renal origin: Secondary | ICD-10-CM | POA: Diagnosis not present

## 2018-03-13 DIAGNOSIS — I9589 Other hypotension: Secondary | ICD-10-CM | POA: Diagnosis not present

## 2018-03-13 DIAGNOSIS — R0602 Shortness of breath: Secondary | ICD-10-CM | POA: Diagnosis not present

## 2018-03-13 DIAGNOSIS — L03114 Cellulitis of left upper limb: Secondary | ICD-10-CM | POA: Diagnosis not present

## 2018-03-13 DIAGNOSIS — H16002 Unspecified corneal ulcer, left eye: Secondary | ICD-10-CM | POA: Diagnosis not present

## 2018-03-13 DIAGNOSIS — Z48812 Encounter for surgical aftercare following surgery on the circulatory system: Secondary | ICD-10-CM | POA: Diagnosis not present

## 2018-03-13 DIAGNOSIS — I499 Cardiac arrhythmia, unspecified: Secondary | ICD-10-CM | POA: Diagnosis not present

## 2018-03-13 DIAGNOSIS — D631 Anemia in chronic kidney disease: Secondary | ICD-10-CM | POA: Diagnosis not present

## 2018-03-13 DIAGNOSIS — Z4932 Encounter for adequacy testing for peritoneal dialysis: Secondary | ICD-10-CM | POA: Diagnosis not present

## 2018-03-13 DIAGNOSIS — N186 End stage renal disease: Secondary | ICD-10-CM | POA: Diagnosis not present

## 2018-03-13 DIAGNOSIS — B957 Other staphylococcus as the cause of diseases classified elsewhere: Secondary | ICD-10-CM | POA: Diagnosis not present

## 2018-03-14 DIAGNOSIS — N186 End stage renal disease: Secondary | ICD-10-CM | POA: Diagnosis not present

## 2018-03-14 DIAGNOSIS — D509 Iron deficiency anemia, unspecified: Secondary | ICD-10-CM | POA: Diagnosis not present

## 2018-03-14 DIAGNOSIS — Z4932 Encounter for adequacy testing for peritoneal dialysis: Secondary | ICD-10-CM | POA: Diagnosis not present

## 2018-03-14 DIAGNOSIS — N2581 Secondary hyperparathyroidism of renal origin: Secondary | ICD-10-CM | POA: Diagnosis not present

## 2018-03-14 DIAGNOSIS — D631 Anemia in chronic kidney disease: Secondary | ICD-10-CM | POA: Diagnosis not present

## 2018-03-15 DIAGNOSIS — D509 Iron deficiency anemia, unspecified: Secondary | ICD-10-CM | POA: Diagnosis not present

## 2018-03-15 DIAGNOSIS — D631 Anemia in chronic kidney disease: Secondary | ICD-10-CM | POA: Diagnosis not present

## 2018-03-15 DIAGNOSIS — N2581 Secondary hyperparathyroidism of renal origin: Secondary | ICD-10-CM | POA: Diagnosis not present

## 2018-03-15 DIAGNOSIS — Z4932 Encounter for adequacy testing for peritoneal dialysis: Secondary | ICD-10-CM | POA: Diagnosis not present

## 2018-03-15 DIAGNOSIS — N186 End stage renal disease: Secondary | ICD-10-CM | POA: Diagnosis not present

## 2018-03-16 DIAGNOSIS — Z4932 Encounter for adequacy testing for peritoneal dialysis: Secondary | ICD-10-CM | POA: Diagnosis not present

## 2018-03-16 DIAGNOSIS — N186 End stage renal disease: Secondary | ICD-10-CM | POA: Diagnosis not present

## 2018-03-16 DIAGNOSIS — N2581 Secondary hyperparathyroidism of renal origin: Secondary | ICD-10-CM | POA: Diagnosis not present

## 2018-03-16 DIAGNOSIS — D509 Iron deficiency anemia, unspecified: Secondary | ICD-10-CM | POA: Diagnosis not present

## 2018-03-16 DIAGNOSIS — D631 Anemia in chronic kidney disease: Secondary | ICD-10-CM | POA: Diagnosis not present

## 2018-03-17 DIAGNOSIS — N2581 Secondary hyperparathyroidism of renal origin: Secondary | ICD-10-CM | POA: Diagnosis not present

## 2018-03-17 DIAGNOSIS — N186 End stage renal disease: Secondary | ICD-10-CM | POA: Diagnosis not present

## 2018-03-17 DIAGNOSIS — Z4932 Encounter for adequacy testing for peritoneal dialysis: Secondary | ICD-10-CM | POA: Diagnosis not present

## 2018-03-17 DIAGNOSIS — D631 Anemia in chronic kidney disease: Secondary | ICD-10-CM | POA: Diagnosis not present

## 2018-03-17 DIAGNOSIS — D509 Iron deficiency anemia, unspecified: Secondary | ICD-10-CM | POA: Diagnosis not present

## 2018-03-18 DIAGNOSIS — D509 Iron deficiency anemia, unspecified: Secondary | ICD-10-CM | POA: Diagnosis not present

## 2018-03-18 DIAGNOSIS — Z9089 Acquired absence of other organs: Secondary | ICD-10-CM | POA: Diagnosis not present

## 2018-03-18 DIAGNOSIS — F209 Schizophrenia, unspecified: Secondary | ICD-10-CM | POA: Diagnosis not present

## 2018-03-18 DIAGNOSIS — Z48812 Encounter for surgical aftercare following surgery on the circulatory system: Secondary | ICD-10-CM | POA: Diagnosis not present

## 2018-03-18 DIAGNOSIS — B957 Other staphylococcus as the cause of diseases classified elsewhere: Secondary | ICD-10-CM | POA: Diagnosis not present

## 2018-03-18 DIAGNOSIS — E785 Hyperlipidemia, unspecified: Secondary | ICD-10-CM | POA: Diagnosis not present

## 2018-03-18 DIAGNOSIS — R680 Hypothermia, not associated with low environmental temperature: Secondary | ICD-10-CM | POA: Diagnosis not present

## 2018-03-18 DIAGNOSIS — K900 Celiac disease: Secondary | ICD-10-CM | POA: Diagnosis not present

## 2018-03-18 DIAGNOSIS — I472 Ventricular tachycardia: Secondary | ICD-10-CM | POA: Diagnosis not present

## 2018-03-18 DIAGNOSIS — N186 End stage renal disease: Secondary | ICD-10-CM | POA: Diagnosis not present

## 2018-03-18 DIAGNOSIS — Z8673 Personal history of transient ischemic attack (TIA), and cerebral infarction without residual deficits: Secondary | ICD-10-CM | POA: Diagnosis not present

## 2018-03-18 DIAGNOSIS — Z87440 Personal history of urinary (tract) infections: Secondary | ICD-10-CM | POA: Diagnosis not present

## 2018-03-18 DIAGNOSIS — F319 Bipolar disorder, unspecified: Secondary | ICD-10-CM | POA: Diagnosis not present

## 2018-03-18 DIAGNOSIS — N2581 Secondary hyperparathyroidism of renal origin: Secondary | ICD-10-CM | POA: Diagnosis not present

## 2018-03-18 DIAGNOSIS — Z8701 Personal history of pneumonia (recurrent): Secondary | ICD-10-CM | POA: Diagnosis not present

## 2018-03-18 DIAGNOSIS — L03114 Cellulitis of left upper limb: Secondary | ICD-10-CM | POA: Diagnosis not present

## 2018-03-18 DIAGNOSIS — Z4932 Encounter for adequacy testing for peritoneal dialysis: Secondary | ICD-10-CM | POA: Diagnosis not present

## 2018-03-18 DIAGNOSIS — B9689 Other specified bacterial agents as the cause of diseases classified elsewhere: Secondary | ICD-10-CM | POA: Diagnosis not present

## 2018-03-18 DIAGNOSIS — Z992 Dependence on renal dialysis: Secondary | ICD-10-CM | POA: Diagnosis not present

## 2018-03-18 DIAGNOSIS — I9589 Other hypotension: Secondary | ICD-10-CM | POA: Diagnosis not present

## 2018-03-18 DIAGNOSIS — Z7982 Long term (current) use of aspirin: Secondary | ICD-10-CM | POA: Diagnosis not present

## 2018-03-18 DIAGNOSIS — Z95820 Peripheral vascular angioplasty status with implants and grafts: Secondary | ICD-10-CM | POA: Diagnosis not present

## 2018-03-18 DIAGNOSIS — H16002 Unspecified corneal ulcer, left eye: Secondary | ICD-10-CM | POA: Diagnosis not present

## 2018-03-18 DIAGNOSIS — I120 Hypertensive chronic kidney disease with stage 5 chronic kidney disease or end stage renal disease: Secondary | ICD-10-CM | POA: Diagnosis not present

## 2018-03-18 DIAGNOSIS — R1310 Dysphagia, unspecified: Secondary | ICD-10-CM | POA: Diagnosis not present

## 2018-03-18 DIAGNOSIS — E039 Hypothyroidism, unspecified: Secondary | ICD-10-CM | POA: Diagnosis not present

## 2018-03-18 DIAGNOSIS — H9190 Unspecified hearing loss, unspecified ear: Secondary | ICD-10-CM | POA: Diagnosis not present

## 2018-03-18 DIAGNOSIS — D631 Anemia in chronic kidney disease: Secondary | ICD-10-CM | POA: Diagnosis not present

## 2018-03-19 DIAGNOSIS — Z4932 Encounter for adequacy testing for peritoneal dialysis: Secondary | ICD-10-CM | POA: Diagnosis not present

## 2018-03-19 DIAGNOSIS — N2581 Secondary hyperparathyroidism of renal origin: Secondary | ICD-10-CM | POA: Diagnosis not present

## 2018-03-19 DIAGNOSIS — N186 End stage renal disease: Secondary | ICD-10-CM | POA: Diagnosis not present

## 2018-03-19 DIAGNOSIS — D509 Iron deficiency anemia, unspecified: Secondary | ICD-10-CM | POA: Diagnosis not present

## 2018-03-19 DIAGNOSIS — D631 Anemia in chronic kidney disease: Secondary | ICD-10-CM | POA: Diagnosis not present

## 2018-03-20 DIAGNOSIS — N186 End stage renal disease: Secondary | ICD-10-CM | POA: Diagnosis not present

## 2018-03-20 DIAGNOSIS — D72829 Elevated white blood cell count, unspecified: Secondary | ICD-10-CM | POA: Diagnosis not present

## 2018-03-20 DIAGNOSIS — D631 Anemia in chronic kidney disease: Secondary | ICD-10-CM | POA: Diagnosis not present

## 2018-03-20 DIAGNOSIS — Z4932 Encounter for adequacy testing for peritoneal dialysis: Secondary | ICD-10-CM | POA: Diagnosis not present

## 2018-03-20 DIAGNOSIS — N2581 Secondary hyperparathyroidism of renal origin: Secondary | ICD-10-CM | POA: Diagnosis not present

## 2018-03-20 DIAGNOSIS — D509 Iron deficiency anemia, unspecified: Secondary | ICD-10-CM | POA: Diagnosis not present

## 2018-03-21 DIAGNOSIS — Z48812 Encounter for surgical aftercare following surgery on the circulatory system: Secondary | ICD-10-CM | POA: Diagnosis not present

## 2018-03-21 DIAGNOSIS — Z4932 Encounter for adequacy testing for peritoneal dialysis: Secondary | ICD-10-CM | POA: Diagnosis not present

## 2018-03-21 DIAGNOSIS — L03114 Cellulitis of left upper limb: Secondary | ICD-10-CM | POA: Diagnosis not present

## 2018-03-21 DIAGNOSIS — I9589 Other hypotension: Secondary | ICD-10-CM | POA: Diagnosis not present

## 2018-03-21 DIAGNOSIS — H16002 Unspecified corneal ulcer, left eye: Secondary | ICD-10-CM | POA: Diagnosis not present

## 2018-03-21 DIAGNOSIS — B957 Other staphylococcus as the cause of diseases classified elsewhere: Secondary | ICD-10-CM | POA: Diagnosis not present

## 2018-03-21 DIAGNOSIS — D509 Iron deficiency anemia, unspecified: Secondary | ICD-10-CM | POA: Diagnosis not present

## 2018-03-21 DIAGNOSIS — N2581 Secondary hyperparathyroidism of renal origin: Secondary | ICD-10-CM | POA: Diagnosis not present

## 2018-03-21 DIAGNOSIS — N186 End stage renal disease: Secondary | ICD-10-CM | POA: Diagnosis not present

## 2018-03-21 DIAGNOSIS — D631 Anemia in chronic kidney disease: Secondary | ICD-10-CM | POA: Diagnosis not present

## 2018-03-21 DIAGNOSIS — B9689 Other specified bacterial agents as the cause of diseases classified elsewhere: Secondary | ICD-10-CM | POA: Diagnosis not present

## 2018-03-22 DIAGNOSIS — D631 Anemia in chronic kidney disease: Secondary | ICD-10-CM | POA: Diagnosis not present

## 2018-03-22 DIAGNOSIS — Z4932 Encounter for adequacy testing for peritoneal dialysis: Secondary | ICD-10-CM | POA: Diagnosis not present

## 2018-03-22 DIAGNOSIS — D509 Iron deficiency anemia, unspecified: Secondary | ICD-10-CM | POA: Diagnosis not present

## 2018-03-22 DIAGNOSIS — N186 End stage renal disease: Secondary | ICD-10-CM | POA: Diagnosis not present

## 2018-03-22 DIAGNOSIS — N2581 Secondary hyperparathyroidism of renal origin: Secondary | ICD-10-CM | POA: Diagnosis not present

## 2018-03-23 DIAGNOSIS — N186 End stage renal disease: Secondary | ICD-10-CM | POA: Diagnosis not present

## 2018-03-23 DIAGNOSIS — D509 Iron deficiency anemia, unspecified: Secondary | ICD-10-CM | POA: Diagnosis not present

## 2018-03-23 DIAGNOSIS — D631 Anemia in chronic kidney disease: Secondary | ICD-10-CM | POA: Diagnosis not present

## 2018-03-23 DIAGNOSIS — N2581 Secondary hyperparathyroidism of renal origin: Secondary | ICD-10-CM | POA: Diagnosis not present

## 2018-03-23 DIAGNOSIS — Z4932 Encounter for adequacy testing for peritoneal dialysis: Secondary | ICD-10-CM | POA: Diagnosis not present

## 2018-03-24 DIAGNOSIS — D631 Anemia in chronic kidney disease: Secondary | ICD-10-CM | POA: Diagnosis not present

## 2018-03-24 DIAGNOSIS — Z4932 Encounter for adequacy testing for peritoneal dialysis: Secondary | ICD-10-CM | POA: Diagnosis not present

## 2018-03-24 DIAGNOSIS — N186 End stage renal disease: Secondary | ICD-10-CM | POA: Diagnosis not present

## 2018-03-24 DIAGNOSIS — D509 Iron deficiency anemia, unspecified: Secondary | ICD-10-CM | POA: Diagnosis not present

## 2018-03-24 DIAGNOSIS — N2581 Secondary hyperparathyroidism of renal origin: Secondary | ICD-10-CM | POA: Diagnosis not present

## 2018-03-25 DIAGNOSIS — D509 Iron deficiency anemia, unspecified: Secondary | ICD-10-CM | POA: Diagnosis not present

## 2018-03-25 DIAGNOSIS — Z4932 Encounter for adequacy testing for peritoneal dialysis: Secondary | ICD-10-CM | POA: Diagnosis not present

## 2018-03-25 DIAGNOSIS — D631 Anemia in chronic kidney disease: Secondary | ICD-10-CM | POA: Diagnosis not present

## 2018-03-25 DIAGNOSIS — N186 End stage renal disease: Secondary | ICD-10-CM | POA: Diagnosis not present

## 2018-03-25 DIAGNOSIS — N2581 Secondary hyperparathyroidism of renal origin: Secondary | ICD-10-CM | POA: Diagnosis not present

## 2018-03-26 DIAGNOSIS — B9689 Other specified bacterial agents as the cause of diseases classified elsewhere: Secondary | ICD-10-CM | POA: Diagnosis not present

## 2018-03-26 DIAGNOSIS — I9589 Other hypotension: Secondary | ICD-10-CM | POA: Diagnosis not present

## 2018-03-26 DIAGNOSIS — N186 End stage renal disease: Secondary | ICD-10-CM | POA: Diagnosis not present

## 2018-03-26 DIAGNOSIS — D631 Anemia in chronic kidney disease: Secondary | ICD-10-CM | POA: Diagnosis not present

## 2018-03-26 DIAGNOSIS — B957 Other staphylococcus as the cause of diseases classified elsewhere: Secondary | ICD-10-CM | POA: Diagnosis not present

## 2018-03-26 DIAGNOSIS — L03114 Cellulitis of left upper limb: Secondary | ICD-10-CM | POA: Diagnosis not present

## 2018-03-26 DIAGNOSIS — N2581 Secondary hyperparathyroidism of renal origin: Secondary | ICD-10-CM | POA: Diagnosis not present

## 2018-03-26 DIAGNOSIS — D509 Iron deficiency anemia, unspecified: Secondary | ICD-10-CM | POA: Diagnosis not present

## 2018-03-26 DIAGNOSIS — Z4932 Encounter for adequacy testing for peritoneal dialysis: Secondary | ICD-10-CM | POA: Diagnosis not present

## 2018-03-26 DIAGNOSIS — Z48812 Encounter for surgical aftercare following surgery on the circulatory system: Secondary | ICD-10-CM | POA: Diagnosis not present

## 2018-03-26 DIAGNOSIS — H16002 Unspecified corneal ulcer, left eye: Secondary | ICD-10-CM | POA: Diagnosis not present

## 2018-03-27 DIAGNOSIS — D509 Iron deficiency anemia, unspecified: Secondary | ICD-10-CM | POA: Diagnosis not present

## 2018-03-27 DIAGNOSIS — D631 Anemia in chronic kidney disease: Secondary | ICD-10-CM | POA: Diagnosis not present

## 2018-03-27 DIAGNOSIS — N186 End stage renal disease: Secondary | ICD-10-CM | POA: Diagnosis not present

## 2018-03-27 DIAGNOSIS — Z4932 Encounter for adequacy testing for peritoneal dialysis: Secondary | ICD-10-CM | POA: Diagnosis not present

## 2018-03-27 DIAGNOSIS — N2581 Secondary hyperparathyroidism of renal origin: Secondary | ICD-10-CM | POA: Diagnosis not present

## 2018-03-28 DIAGNOSIS — D509 Iron deficiency anemia, unspecified: Secondary | ICD-10-CM | POA: Diagnosis not present

## 2018-03-28 DIAGNOSIS — D631 Anemia in chronic kidney disease: Secondary | ICD-10-CM | POA: Diagnosis not present

## 2018-03-28 DIAGNOSIS — Z4932 Encounter for adequacy testing for peritoneal dialysis: Secondary | ICD-10-CM | POA: Diagnosis not present

## 2018-03-28 DIAGNOSIS — N186 End stage renal disease: Secondary | ICD-10-CM | POA: Diagnosis not present

## 2018-03-28 DIAGNOSIS — N2581 Secondary hyperparathyroidism of renal origin: Secondary | ICD-10-CM | POA: Diagnosis not present

## 2018-03-29 DIAGNOSIS — N2581 Secondary hyperparathyroidism of renal origin: Secondary | ICD-10-CM | POA: Diagnosis not present

## 2018-03-29 DIAGNOSIS — D631 Anemia in chronic kidney disease: Secondary | ICD-10-CM | POA: Diagnosis not present

## 2018-03-29 DIAGNOSIS — N186 End stage renal disease: Secondary | ICD-10-CM | POA: Diagnosis not present

## 2018-03-29 DIAGNOSIS — Z4932 Encounter for adequacy testing for peritoneal dialysis: Secondary | ICD-10-CM | POA: Diagnosis not present

## 2018-03-29 DIAGNOSIS — D509 Iron deficiency anemia, unspecified: Secondary | ICD-10-CM | POA: Diagnosis not present

## 2018-03-30 DIAGNOSIS — Z4932 Encounter for adequacy testing for peritoneal dialysis: Secondary | ICD-10-CM | POA: Diagnosis not present

## 2018-03-30 DIAGNOSIS — N2581 Secondary hyperparathyroidism of renal origin: Secondary | ICD-10-CM | POA: Diagnosis not present

## 2018-03-30 DIAGNOSIS — N186 End stage renal disease: Secondary | ICD-10-CM | POA: Diagnosis not present

## 2018-03-30 DIAGNOSIS — Z79899 Other long term (current) drug therapy: Secondary | ICD-10-CM | POA: Diagnosis not present

## 2018-03-30 DIAGNOSIS — D509 Iron deficiency anemia, unspecified: Secondary | ICD-10-CM | POA: Diagnosis not present

## 2018-03-30 DIAGNOSIS — R17 Unspecified jaundice: Secondary | ICD-10-CM | POA: Diagnosis not present

## 2018-03-30 DIAGNOSIS — E440 Moderate protein-calorie malnutrition: Secondary | ICD-10-CM | POA: Diagnosis not present

## 2018-03-30 DIAGNOSIS — D631 Anemia in chronic kidney disease: Secondary | ICD-10-CM | POA: Diagnosis not present

## 2018-03-31 DIAGNOSIS — Z4932 Encounter for adequacy testing for peritoneal dialysis: Secondary | ICD-10-CM | POA: Diagnosis not present

## 2018-03-31 DIAGNOSIS — D631 Anemia in chronic kidney disease: Secondary | ICD-10-CM | POA: Diagnosis not present

## 2018-03-31 DIAGNOSIS — Z79899 Other long term (current) drug therapy: Secondary | ICD-10-CM | POA: Diagnosis not present

## 2018-03-31 DIAGNOSIS — D509 Iron deficiency anemia, unspecified: Secondary | ICD-10-CM | POA: Diagnosis not present

## 2018-03-31 DIAGNOSIS — R17 Unspecified jaundice: Secondary | ICD-10-CM | POA: Diagnosis not present

## 2018-03-31 DIAGNOSIS — N186 End stage renal disease: Secondary | ICD-10-CM | POA: Diagnosis not present

## 2018-04-01 DIAGNOSIS — D631 Anemia in chronic kidney disease: Secondary | ICD-10-CM | POA: Diagnosis not present

## 2018-04-01 DIAGNOSIS — N186 End stage renal disease: Secondary | ICD-10-CM | POA: Diagnosis not present

## 2018-04-01 DIAGNOSIS — R17 Unspecified jaundice: Secondary | ICD-10-CM | POA: Diagnosis not present

## 2018-04-01 DIAGNOSIS — D509 Iron deficiency anemia, unspecified: Secondary | ICD-10-CM | POA: Diagnosis not present

## 2018-04-01 DIAGNOSIS — Z4932 Encounter for adequacy testing for peritoneal dialysis: Secondary | ICD-10-CM | POA: Diagnosis not present

## 2018-04-01 DIAGNOSIS — Z79899 Other long term (current) drug therapy: Secondary | ICD-10-CM | POA: Diagnosis not present

## 2018-04-02 DIAGNOSIS — Z48812 Encounter for surgical aftercare following surgery on the circulatory system: Secondary | ICD-10-CM | POA: Diagnosis not present

## 2018-04-02 DIAGNOSIS — L03114 Cellulitis of left upper limb: Secondary | ICD-10-CM | POA: Diagnosis not present

## 2018-04-02 DIAGNOSIS — B957 Other staphylococcus as the cause of diseases classified elsewhere: Secondary | ICD-10-CM | POA: Diagnosis not present

## 2018-04-02 DIAGNOSIS — I9589 Other hypotension: Secondary | ICD-10-CM | POA: Diagnosis not present

## 2018-04-02 DIAGNOSIS — Z4932 Encounter for adequacy testing for peritoneal dialysis: Secondary | ICD-10-CM | POA: Diagnosis not present

## 2018-04-02 DIAGNOSIS — B9689 Other specified bacterial agents as the cause of diseases classified elsewhere: Secondary | ICD-10-CM | POA: Diagnosis not present

## 2018-04-02 DIAGNOSIS — D509 Iron deficiency anemia, unspecified: Secondary | ICD-10-CM | POA: Diagnosis not present

## 2018-04-02 DIAGNOSIS — N186 End stage renal disease: Secondary | ICD-10-CM | POA: Diagnosis not present

## 2018-04-02 DIAGNOSIS — D631 Anemia in chronic kidney disease: Secondary | ICD-10-CM | POA: Diagnosis not present

## 2018-04-02 DIAGNOSIS — H16002 Unspecified corneal ulcer, left eye: Secondary | ICD-10-CM | POA: Diagnosis not present

## 2018-04-02 DIAGNOSIS — R17 Unspecified jaundice: Secondary | ICD-10-CM | POA: Diagnosis not present

## 2018-04-02 DIAGNOSIS — Z79899 Other long term (current) drug therapy: Secondary | ICD-10-CM | POA: Diagnosis not present

## 2018-04-03 DIAGNOSIS — R17 Unspecified jaundice: Secondary | ICD-10-CM | POA: Diagnosis not present

## 2018-04-03 DIAGNOSIS — N186 End stage renal disease: Secondary | ICD-10-CM | POA: Diagnosis not present

## 2018-04-03 DIAGNOSIS — Z4932 Encounter for adequacy testing for peritoneal dialysis: Secondary | ICD-10-CM | POA: Diagnosis not present

## 2018-04-03 DIAGNOSIS — D509 Iron deficiency anemia, unspecified: Secondary | ICD-10-CM | POA: Diagnosis not present

## 2018-04-03 DIAGNOSIS — E7849 Other hyperlipidemia: Secondary | ICD-10-CM | POA: Diagnosis not present

## 2018-04-03 DIAGNOSIS — D631 Anemia in chronic kidney disease: Secondary | ICD-10-CM | POA: Diagnosis not present

## 2018-04-03 DIAGNOSIS — Z79899 Other long term (current) drug therapy: Secondary | ICD-10-CM | POA: Diagnosis not present

## 2018-04-03 DIAGNOSIS — R82998 Other abnormal findings in urine: Secondary | ICD-10-CM | POA: Diagnosis not present

## 2018-04-04 DIAGNOSIS — R17 Unspecified jaundice: Secondary | ICD-10-CM | POA: Diagnosis not present

## 2018-04-04 DIAGNOSIS — Z4932 Encounter for adequacy testing for peritoneal dialysis: Secondary | ICD-10-CM | POA: Diagnosis not present

## 2018-04-04 DIAGNOSIS — D631 Anemia in chronic kidney disease: Secondary | ICD-10-CM | POA: Diagnosis not present

## 2018-04-04 DIAGNOSIS — D509 Iron deficiency anemia, unspecified: Secondary | ICD-10-CM | POA: Diagnosis not present

## 2018-04-04 DIAGNOSIS — N186 End stage renal disease: Secondary | ICD-10-CM | POA: Diagnosis not present

## 2018-04-04 DIAGNOSIS — Z79899 Other long term (current) drug therapy: Secondary | ICD-10-CM | POA: Diagnosis not present

## 2018-04-05 DIAGNOSIS — D509 Iron deficiency anemia, unspecified: Secondary | ICD-10-CM | POA: Diagnosis not present

## 2018-04-05 DIAGNOSIS — D631 Anemia in chronic kidney disease: Secondary | ICD-10-CM | POA: Diagnosis not present

## 2018-04-05 DIAGNOSIS — Z79899 Other long term (current) drug therapy: Secondary | ICD-10-CM | POA: Diagnosis not present

## 2018-04-05 DIAGNOSIS — Z4932 Encounter for adequacy testing for peritoneal dialysis: Secondary | ICD-10-CM | POA: Diagnosis not present

## 2018-04-05 DIAGNOSIS — R17 Unspecified jaundice: Secondary | ICD-10-CM | POA: Diagnosis not present

## 2018-04-05 DIAGNOSIS — N186 End stage renal disease: Secondary | ICD-10-CM | POA: Diagnosis not present

## 2018-04-06 DIAGNOSIS — Z79899 Other long term (current) drug therapy: Secondary | ICD-10-CM | POA: Diagnosis not present

## 2018-04-06 DIAGNOSIS — R17 Unspecified jaundice: Secondary | ICD-10-CM | POA: Diagnosis not present

## 2018-04-06 DIAGNOSIS — D631 Anemia in chronic kidney disease: Secondary | ICD-10-CM | POA: Diagnosis not present

## 2018-04-06 DIAGNOSIS — Z4932 Encounter for adequacy testing for peritoneal dialysis: Secondary | ICD-10-CM | POA: Diagnosis not present

## 2018-04-06 DIAGNOSIS — D509 Iron deficiency anemia, unspecified: Secondary | ICD-10-CM | POA: Diagnosis not present

## 2018-04-06 DIAGNOSIS — N186 End stage renal disease: Secondary | ICD-10-CM | POA: Diagnosis not present

## 2018-04-07 DIAGNOSIS — Z79899 Other long term (current) drug therapy: Secondary | ICD-10-CM | POA: Diagnosis not present

## 2018-04-07 DIAGNOSIS — R17 Unspecified jaundice: Secondary | ICD-10-CM | POA: Diagnosis not present

## 2018-04-07 DIAGNOSIS — D509 Iron deficiency anemia, unspecified: Secondary | ICD-10-CM | POA: Diagnosis not present

## 2018-04-07 DIAGNOSIS — D631 Anemia in chronic kidney disease: Secondary | ICD-10-CM | POA: Diagnosis not present

## 2018-04-07 DIAGNOSIS — N186 End stage renal disease: Secondary | ICD-10-CM | POA: Diagnosis not present

## 2018-04-07 DIAGNOSIS — Z4932 Encounter for adequacy testing for peritoneal dialysis: Secondary | ICD-10-CM | POA: Diagnosis not present

## 2018-04-08 DIAGNOSIS — N186 End stage renal disease: Secondary | ICD-10-CM | POA: Diagnosis not present

## 2018-04-08 DIAGNOSIS — D631 Anemia in chronic kidney disease: Secondary | ICD-10-CM | POA: Diagnosis not present

## 2018-04-08 DIAGNOSIS — Z79899 Other long term (current) drug therapy: Secondary | ICD-10-CM | POA: Diagnosis not present

## 2018-04-08 DIAGNOSIS — R17 Unspecified jaundice: Secondary | ICD-10-CM | POA: Diagnosis not present

## 2018-04-08 DIAGNOSIS — Z4932 Encounter for adequacy testing for peritoneal dialysis: Secondary | ICD-10-CM | POA: Diagnosis not present

## 2018-04-08 DIAGNOSIS — D509 Iron deficiency anemia, unspecified: Secondary | ICD-10-CM | POA: Diagnosis not present

## 2018-04-09 DIAGNOSIS — Z79899 Other long term (current) drug therapy: Secondary | ICD-10-CM | POA: Diagnosis not present

## 2018-04-09 DIAGNOSIS — R17 Unspecified jaundice: Secondary | ICD-10-CM | POA: Diagnosis not present

## 2018-04-09 DIAGNOSIS — D631 Anemia in chronic kidney disease: Secondary | ICD-10-CM | POA: Diagnosis not present

## 2018-04-09 DIAGNOSIS — N186 End stage renal disease: Secondary | ICD-10-CM | POA: Diagnosis not present

## 2018-04-09 DIAGNOSIS — Z4932 Encounter for adequacy testing for peritoneal dialysis: Secondary | ICD-10-CM | POA: Diagnosis not present

## 2018-04-09 DIAGNOSIS — D509 Iron deficiency anemia, unspecified: Secondary | ICD-10-CM | POA: Diagnosis not present

## 2018-04-10 DIAGNOSIS — Z4932 Encounter for adequacy testing for peritoneal dialysis: Secondary | ICD-10-CM | POA: Diagnosis not present

## 2018-04-10 DIAGNOSIS — D631 Anemia in chronic kidney disease: Secondary | ICD-10-CM | POA: Diagnosis not present

## 2018-04-10 DIAGNOSIS — R17 Unspecified jaundice: Secondary | ICD-10-CM | POA: Diagnosis not present

## 2018-04-10 DIAGNOSIS — N186 End stage renal disease: Secondary | ICD-10-CM | POA: Diagnosis not present

## 2018-04-10 DIAGNOSIS — Z79899 Other long term (current) drug therapy: Secondary | ICD-10-CM | POA: Diagnosis not present

## 2018-04-10 DIAGNOSIS — D509 Iron deficiency anemia, unspecified: Secondary | ICD-10-CM | POA: Diagnosis not present

## 2018-04-11 DIAGNOSIS — L03114 Cellulitis of left upper limb: Secondary | ICD-10-CM | POA: Diagnosis not present

## 2018-04-11 DIAGNOSIS — Z48812 Encounter for surgical aftercare following surgery on the circulatory system: Secondary | ICD-10-CM | POA: Diagnosis not present

## 2018-04-11 DIAGNOSIS — Z4932 Encounter for adequacy testing for peritoneal dialysis: Secondary | ICD-10-CM | POA: Diagnosis not present

## 2018-04-11 DIAGNOSIS — H16002 Unspecified corneal ulcer, left eye: Secondary | ICD-10-CM | POA: Diagnosis not present

## 2018-04-11 DIAGNOSIS — D631 Anemia in chronic kidney disease: Secondary | ICD-10-CM | POA: Diagnosis not present

## 2018-04-11 DIAGNOSIS — Z79899 Other long term (current) drug therapy: Secondary | ICD-10-CM | POA: Diagnosis not present

## 2018-04-11 DIAGNOSIS — R17 Unspecified jaundice: Secondary | ICD-10-CM | POA: Diagnosis not present

## 2018-04-11 DIAGNOSIS — D509 Iron deficiency anemia, unspecified: Secondary | ICD-10-CM | POA: Diagnosis not present

## 2018-04-11 DIAGNOSIS — N186 End stage renal disease: Secondary | ICD-10-CM | POA: Diagnosis not present

## 2018-04-11 DIAGNOSIS — I9589 Other hypotension: Secondary | ICD-10-CM | POA: Diagnosis not present

## 2018-04-11 DIAGNOSIS — B957 Other staphylococcus as the cause of diseases classified elsewhere: Secondary | ICD-10-CM | POA: Diagnosis not present

## 2018-04-11 DIAGNOSIS — B9689 Other specified bacterial agents as the cause of diseases classified elsewhere: Secondary | ICD-10-CM | POA: Diagnosis not present

## 2018-04-12 DIAGNOSIS — D509 Iron deficiency anemia, unspecified: Secondary | ICD-10-CM | POA: Diagnosis not present

## 2018-04-12 DIAGNOSIS — N186 End stage renal disease: Secondary | ICD-10-CM | POA: Diagnosis not present

## 2018-04-12 DIAGNOSIS — D631 Anemia in chronic kidney disease: Secondary | ICD-10-CM | POA: Diagnosis not present

## 2018-04-12 DIAGNOSIS — R17 Unspecified jaundice: Secondary | ICD-10-CM | POA: Diagnosis not present

## 2018-04-12 DIAGNOSIS — Z79899 Other long term (current) drug therapy: Secondary | ICD-10-CM | POA: Diagnosis not present

## 2018-04-12 DIAGNOSIS — Z4932 Encounter for adequacy testing for peritoneal dialysis: Secondary | ICD-10-CM | POA: Diagnosis not present

## 2018-04-13 DIAGNOSIS — D631 Anemia in chronic kidney disease: Secondary | ICD-10-CM | POA: Diagnosis not present

## 2018-04-13 DIAGNOSIS — Z4932 Encounter for adequacy testing for peritoneal dialysis: Secondary | ICD-10-CM | POA: Diagnosis not present

## 2018-04-13 DIAGNOSIS — D509 Iron deficiency anemia, unspecified: Secondary | ICD-10-CM | POA: Diagnosis not present

## 2018-04-13 DIAGNOSIS — N186 End stage renal disease: Secondary | ICD-10-CM | POA: Diagnosis not present

## 2018-04-13 DIAGNOSIS — R17 Unspecified jaundice: Secondary | ICD-10-CM | POA: Diagnosis not present

## 2018-04-13 DIAGNOSIS — Z79899 Other long term (current) drug therapy: Secondary | ICD-10-CM | POA: Diagnosis not present

## 2018-04-14 DIAGNOSIS — D631 Anemia in chronic kidney disease: Secondary | ICD-10-CM | POA: Diagnosis not present

## 2018-04-14 DIAGNOSIS — Z4932 Encounter for adequacy testing for peritoneal dialysis: Secondary | ICD-10-CM | POA: Diagnosis not present

## 2018-04-14 DIAGNOSIS — R17 Unspecified jaundice: Secondary | ICD-10-CM | POA: Diagnosis not present

## 2018-04-14 DIAGNOSIS — D509 Iron deficiency anemia, unspecified: Secondary | ICD-10-CM | POA: Diagnosis not present

## 2018-04-14 DIAGNOSIS — Z79899 Other long term (current) drug therapy: Secondary | ICD-10-CM | POA: Diagnosis not present

## 2018-04-14 DIAGNOSIS — N186 End stage renal disease: Secondary | ICD-10-CM | POA: Diagnosis not present

## 2018-04-15 DIAGNOSIS — D509 Iron deficiency anemia, unspecified: Secondary | ICD-10-CM | POA: Diagnosis not present

## 2018-04-15 DIAGNOSIS — Z4932 Encounter for adequacy testing for peritoneal dialysis: Secondary | ICD-10-CM | POA: Diagnosis not present

## 2018-04-15 DIAGNOSIS — Z79899 Other long term (current) drug therapy: Secondary | ICD-10-CM | POA: Diagnosis not present

## 2018-04-15 DIAGNOSIS — R17 Unspecified jaundice: Secondary | ICD-10-CM | POA: Diagnosis not present

## 2018-04-15 DIAGNOSIS — D631 Anemia in chronic kidney disease: Secondary | ICD-10-CM | POA: Diagnosis not present

## 2018-04-15 DIAGNOSIS — N186 End stage renal disease: Secondary | ICD-10-CM | POA: Diagnosis not present

## 2018-04-16 DIAGNOSIS — Z79899 Other long term (current) drug therapy: Secondary | ICD-10-CM | POA: Diagnosis not present

## 2018-04-16 DIAGNOSIS — R17 Unspecified jaundice: Secondary | ICD-10-CM | POA: Diagnosis not present

## 2018-04-16 DIAGNOSIS — Z4932 Encounter for adequacy testing for peritoneal dialysis: Secondary | ICD-10-CM | POA: Diagnosis not present

## 2018-04-16 DIAGNOSIS — N186 End stage renal disease: Secondary | ICD-10-CM | POA: Diagnosis not present

## 2018-04-16 DIAGNOSIS — D509 Iron deficiency anemia, unspecified: Secondary | ICD-10-CM | POA: Diagnosis not present

## 2018-04-16 DIAGNOSIS — D631 Anemia in chronic kidney disease: Secondary | ICD-10-CM | POA: Diagnosis not present

## 2018-04-17 DIAGNOSIS — D509 Iron deficiency anemia, unspecified: Secondary | ICD-10-CM | POA: Diagnosis not present

## 2018-04-17 DIAGNOSIS — R17 Unspecified jaundice: Secondary | ICD-10-CM | POA: Diagnosis not present

## 2018-04-17 DIAGNOSIS — D631 Anemia in chronic kidney disease: Secondary | ICD-10-CM | POA: Diagnosis not present

## 2018-04-17 DIAGNOSIS — N186 End stage renal disease: Secondary | ICD-10-CM | POA: Diagnosis not present

## 2018-04-17 DIAGNOSIS — Z4932 Encounter for adequacy testing for peritoneal dialysis: Secondary | ICD-10-CM | POA: Diagnosis not present

## 2018-04-17 DIAGNOSIS — Z79899 Other long term (current) drug therapy: Secondary | ICD-10-CM | POA: Diagnosis not present

## 2018-04-18 DIAGNOSIS — Z4932 Encounter for adequacy testing for peritoneal dialysis: Secondary | ICD-10-CM | POA: Diagnosis not present

## 2018-04-18 DIAGNOSIS — N186 End stage renal disease: Secondary | ICD-10-CM | POA: Diagnosis not present

## 2018-04-18 DIAGNOSIS — D509 Iron deficiency anemia, unspecified: Secondary | ICD-10-CM | POA: Diagnosis not present

## 2018-04-18 DIAGNOSIS — D631 Anemia in chronic kidney disease: Secondary | ICD-10-CM | POA: Diagnosis not present

## 2018-04-18 DIAGNOSIS — Z79899 Other long term (current) drug therapy: Secondary | ICD-10-CM | POA: Diagnosis not present

## 2018-04-18 DIAGNOSIS — R17 Unspecified jaundice: Secondary | ICD-10-CM | POA: Diagnosis not present

## 2018-04-19 DIAGNOSIS — Z4932 Encounter for adequacy testing for peritoneal dialysis: Secondary | ICD-10-CM | POA: Diagnosis not present

## 2018-04-19 DIAGNOSIS — D509 Iron deficiency anemia, unspecified: Secondary | ICD-10-CM | POA: Diagnosis not present

## 2018-04-19 DIAGNOSIS — N186 End stage renal disease: Secondary | ICD-10-CM | POA: Diagnosis not present

## 2018-04-19 DIAGNOSIS — Z79899 Other long term (current) drug therapy: Secondary | ICD-10-CM | POA: Diagnosis not present

## 2018-04-19 DIAGNOSIS — R17 Unspecified jaundice: Secondary | ICD-10-CM | POA: Diagnosis not present

## 2018-04-19 DIAGNOSIS — D631 Anemia in chronic kidney disease: Secondary | ICD-10-CM | POA: Diagnosis not present

## 2018-04-20 DIAGNOSIS — D509 Iron deficiency anemia, unspecified: Secondary | ICD-10-CM | POA: Diagnosis not present

## 2018-04-20 DIAGNOSIS — Z4932 Encounter for adequacy testing for peritoneal dialysis: Secondary | ICD-10-CM | POA: Diagnosis not present

## 2018-04-20 DIAGNOSIS — D631 Anemia in chronic kidney disease: Secondary | ICD-10-CM | POA: Diagnosis not present

## 2018-04-20 DIAGNOSIS — Z79899 Other long term (current) drug therapy: Secondary | ICD-10-CM | POA: Diagnosis not present

## 2018-04-20 DIAGNOSIS — N186 End stage renal disease: Secondary | ICD-10-CM | POA: Diagnosis not present

## 2018-04-20 DIAGNOSIS — R17 Unspecified jaundice: Secondary | ICD-10-CM | POA: Diagnosis not present

## 2018-04-21 DIAGNOSIS — N186 End stage renal disease: Secondary | ICD-10-CM | POA: Diagnosis not present

## 2018-04-21 DIAGNOSIS — D631 Anemia in chronic kidney disease: Secondary | ICD-10-CM | POA: Diagnosis not present

## 2018-04-21 DIAGNOSIS — D509 Iron deficiency anemia, unspecified: Secondary | ICD-10-CM | POA: Diagnosis not present

## 2018-04-21 DIAGNOSIS — Z79899 Other long term (current) drug therapy: Secondary | ICD-10-CM | POA: Diagnosis not present

## 2018-04-21 DIAGNOSIS — R17 Unspecified jaundice: Secondary | ICD-10-CM | POA: Diagnosis not present

## 2018-04-21 DIAGNOSIS — Z4932 Encounter for adequacy testing for peritoneal dialysis: Secondary | ICD-10-CM | POA: Diagnosis not present

## 2018-04-22 DIAGNOSIS — R17 Unspecified jaundice: Secondary | ICD-10-CM | POA: Diagnosis not present

## 2018-04-22 DIAGNOSIS — D631 Anemia in chronic kidney disease: Secondary | ICD-10-CM | POA: Diagnosis not present

## 2018-04-22 DIAGNOSIS — N186 End stage renal disease: Secondary | ICD-10-CM | POA: Diagnosis not present

## 2018-04-22 DIAGNOSIS — D509 Iron deficiency anemia, unspecified: Secondary | ICD-10-CM | POA: Diagnosis not present

## 2018-04-22 DIAGNOSIS — Z4932 Encounter for adequacy testing for peritoneal dialysis: Secondary | ICD-10-CM | POA: Diagnosis not present

## 2018-04-22 DIAGNOSIS — Z79899 Other long term (current) drug therapy: Secondary | ICD-10-CM | POA: Diagnosis not present

## 2018-04-23 DIAGNOSIS — R17 Unspecified jaundice: Secondary | ICD-10-CM | POA: Diagnosis not present

## 2018-04-23 DIAGNOSIS — Z4932 Encounter for adequacy testing for peritoneal dialysis: Secondary | ICD-10-CM | POA: Diagnosis not present

## 2018-04-23 DIAGNOSIS — Z79899 Other long term (current) drug therapy: Secondary | ICD-10-CM | POA: Diagnosis not present

## 2018-04-23 DIAGNOSIS — D631 Anemia in chronic kidney disease: Secondary | ICD-10-CM | POA: Diagnosis not present

## 2018-04-23 DIAGNOSIS — D509 Iron deficiency anemia, unspecified: Secondary | ICD-10-CM | POA: Diagnosis not present

## 2018-04-23 DIAGNOSIS — N186 End stage renal disease: Secondary | ICD-10-CM | POA: Diagnosis not present

## 2018-04-24 DIAGNOSIS — D631 Anemia in chronic kidney disease: Secondary | ICD-10-CM | POA: Diagnosis not present

## 2018-04-24 DIAGNOSIS — Z4932 Encounter for adequacy testing for peritoneal dialysis: Secondary | ICD-10-CM | POA: Diagnosis not present

## 2018-04-24 DIAGNOSIS — Z79899 Other long term (current) drug therapy: Secondary | ICD-10-CM | POA: Diagnosis not present

## 2018-04-24 DIAGNOSIS — R17 Unspecified jaundice: Secondary | ICD-10-CM | POA: Diagnosis not present

## 2018-04-24 DIAGNOSIS — D509 Iron deficiency anemia, unspecified: Secondary | ICD-10-CM | POA: Diagnosis not present

## 2018-04-24 DIAGNOSIS — N186 End stage renal disease: Secondary | ICD-10-CM | POA: Diagnosis not present

## 2018-04-24 DIAGNOSIS — Z992 Dependence on renal dialysis: Secondary | ICD-10-CM | POA: Diagnosis not present

## 2018-04-24 DIAGNOSIS — I7789 Other specified disorders of arteries and arterioles: Secondary | ICD-10-CM | POA: Diagnosis not present

## 2018-04-25 DIAGNOSIS — D631 Anemia in chronic kidney disease: Secondary | ICD-10-CM | POA: Diagnosis not present

## 2018-04-25 DIAGNOSIS — Z4932 Encounter for adequacy testing for peritoneal dialysis: Secondary | ICD-10-CM | POA: Diagnosis not present

## 2018-04-25 DIAGNOSIS — Z79899 Other long term (current) drug therapy: Secondary | ICD-10-CM | POA: Diagnosis not present

## 2018-04-25 DIAGNOSIS — D509 Iron deficiency anemia, unspecified: Secondary | ICD-10-CM | POA: Diagnosis not present

## 2018-04-25 DIAGNOSIS — N186 End stage renal disease: Secondary | ICD-10-CM | POA: Diagnosis not present

## 2018-04-25 DIAGNOSIS — R17 Unspecified jaundice: Secondary | ICD-10-CM | POA: Diagnosis not present

## 2018-04-29 DIAGNOSIS — I7789 Other specified disorders of arteries and arterioles: Secondary | ICD-10-CM | POA: Diagnosis not present

## 2018-04-29 DIAGNOSIS — Z992 Dependence on renal dialysis: Secondary | ICD-10-CM | POA: Diagnosis not present

## 2018-04-29 DIAGNOSIS — N186 End stage renal disease: Secondary | ICD-10-CM | POA: Diagnosis not present

## 2018-04-30 DEATH — deceased

## 2018-08-29 ENCOUNTER — Other Ambulatory Visit: Payer: Self-pay

## 2018-11-24 IMAGING — CT CT HEAD W/O CM
4 series · 16 of 47 positions shown, 18 images · non-contrast
Comparison: CT of the head performed 03/04/2016

CLINICAL DATA: Found on floor. Status post fall out of bed. Concern
for head injury. Initial encounter.

EXAM:
CT HEAD WITHOUT CONTRAST
TECHNIQUE: Contiguous axial images were obtained from the base of the skull
through the vertex without intravenous contrast.

[Series 3: head without · axial · non-contrast · 0.43mm/px · z∈[-186,-71]mm · 6 of 33 slices shown, 8 images]
[im 5/33  brain]
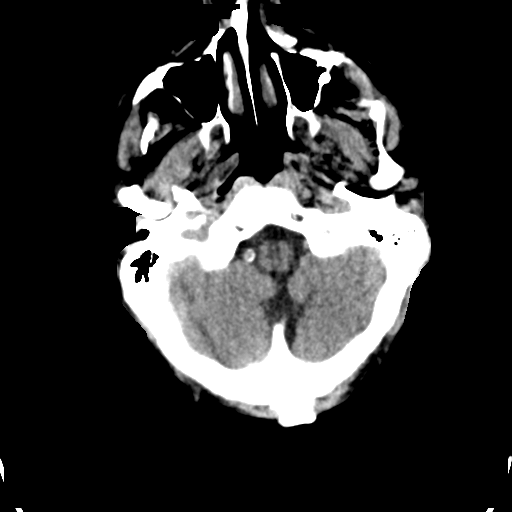
[im 5/33  bone]
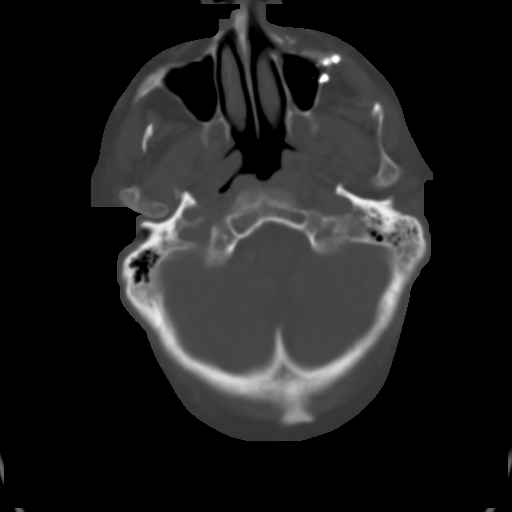
[im 10/33  brain]
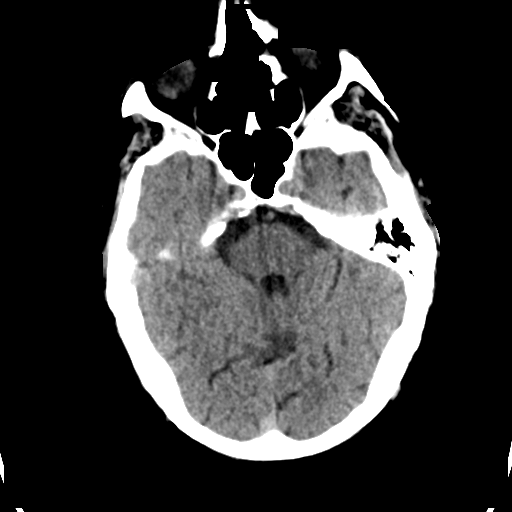
[im 14/33  brain]
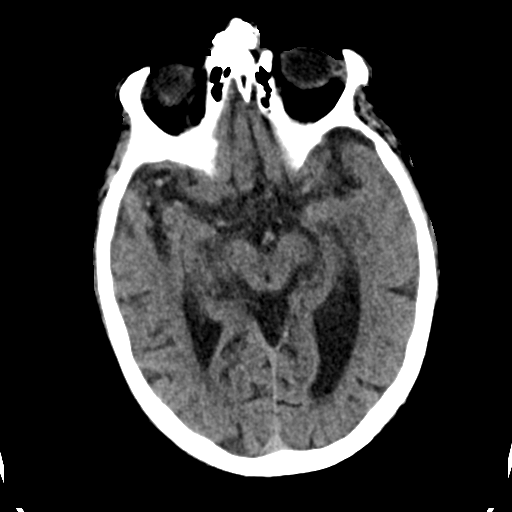
[im 19/33  brain]
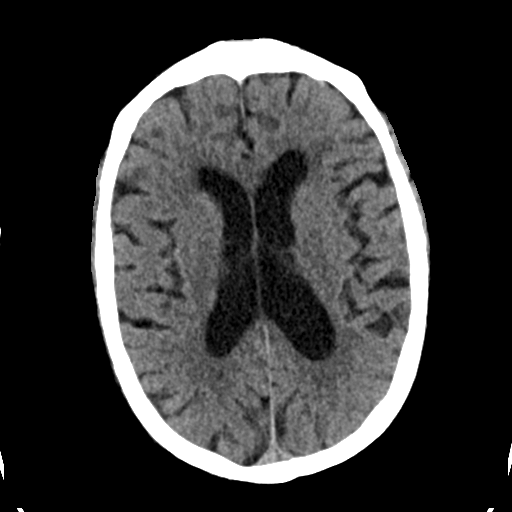
[im 23/33  brain]
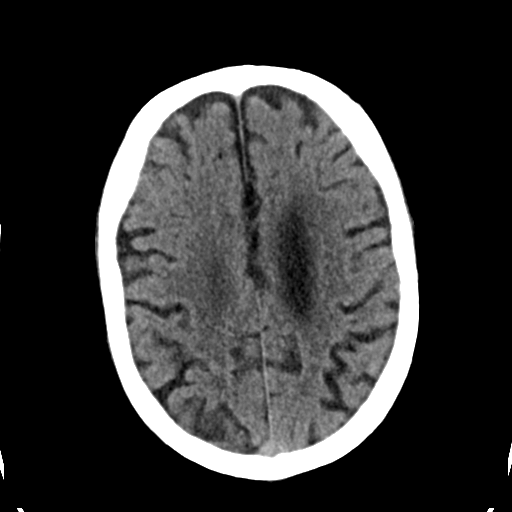
[im 23/33  bone]
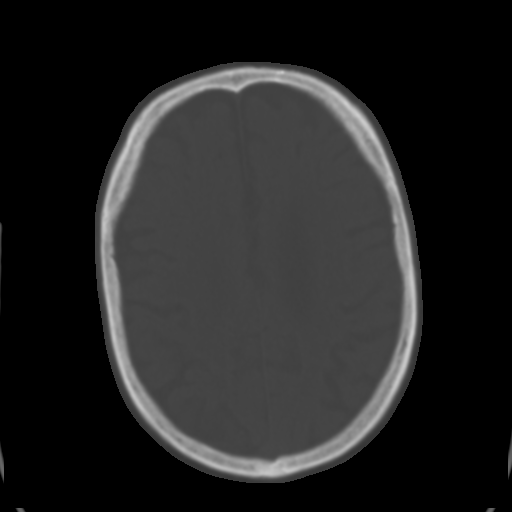
[im 28/33  brain]
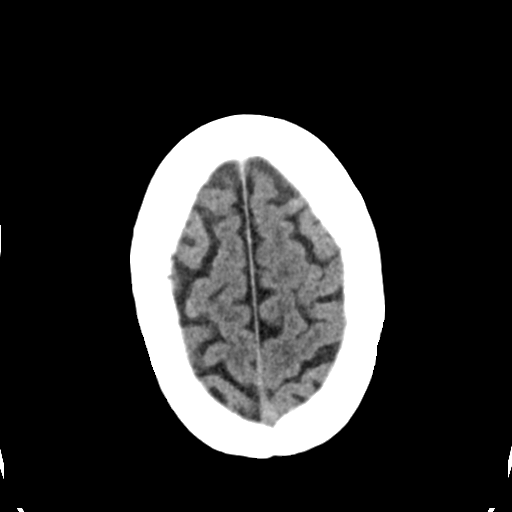

[Series 4: head bone · axial · 0.43mm/px · z∈[-190,-134]mm · 4 of 85 slices shown]
[im 9/85  bone]
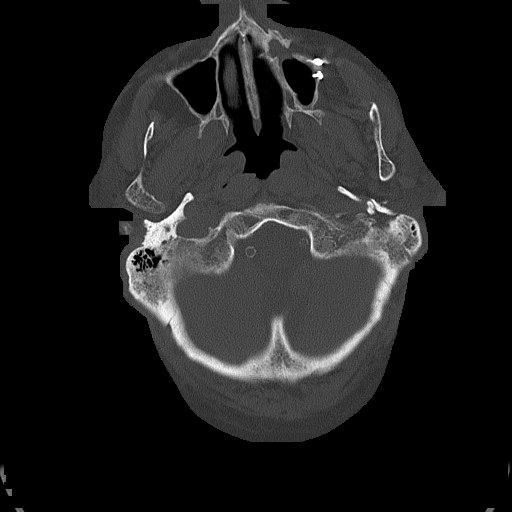
[im 17/85  bone]
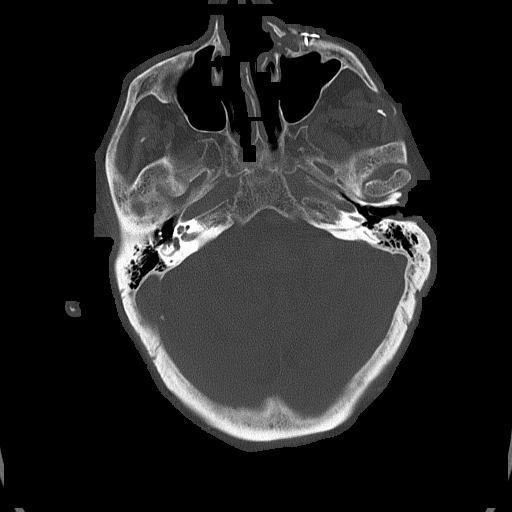
[im 29/85  bone]
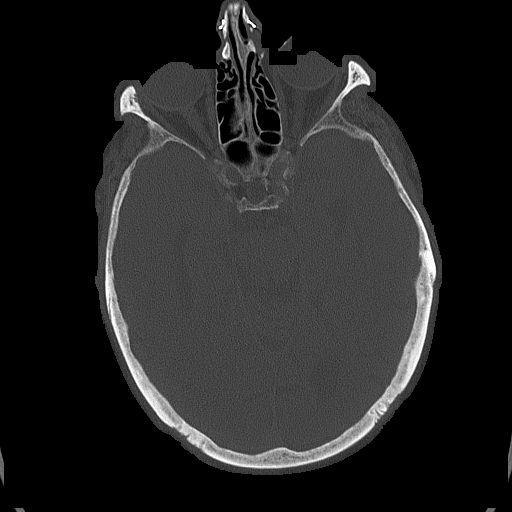
[im 37/85  bone]
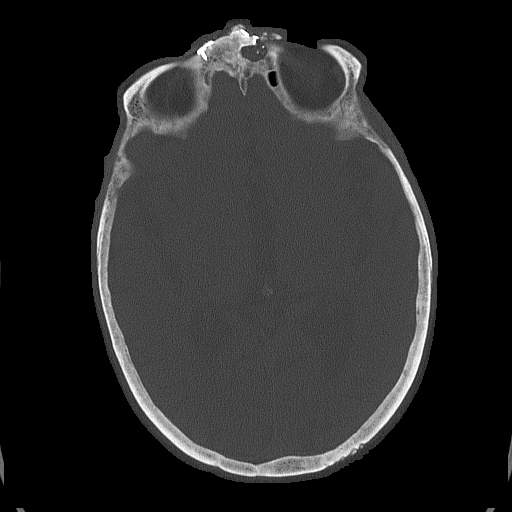

[Series 5: head without cor · coronal · non-contrast · 0.32mm/px · 3 of 68 slices shown]
[im 23/68  brain]
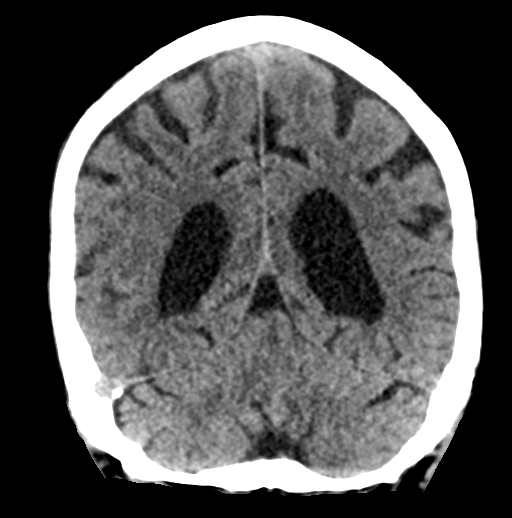
[im 30/68  brain]
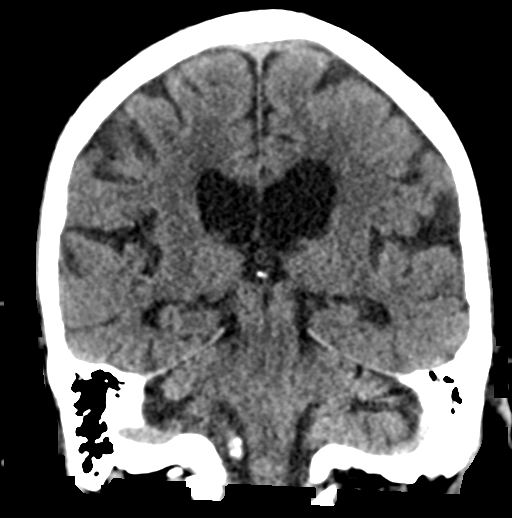
[im 38/68  brain]
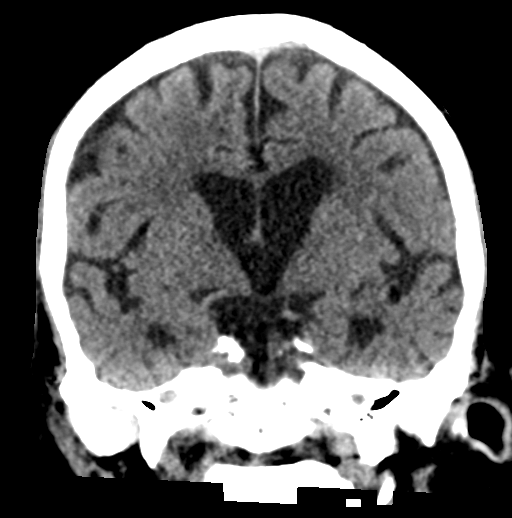

[Series 6: head without sag · sagittal · non-contrast · 0.33mm/px · 3 of 49 slices shown]
[im 17/49  brain]
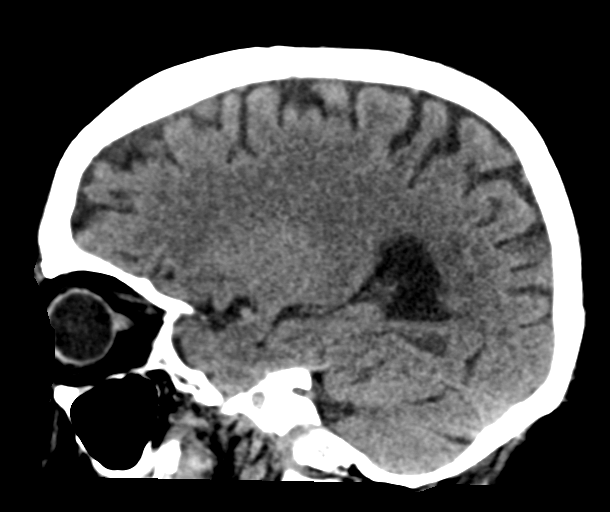
[im 25/49  brain]
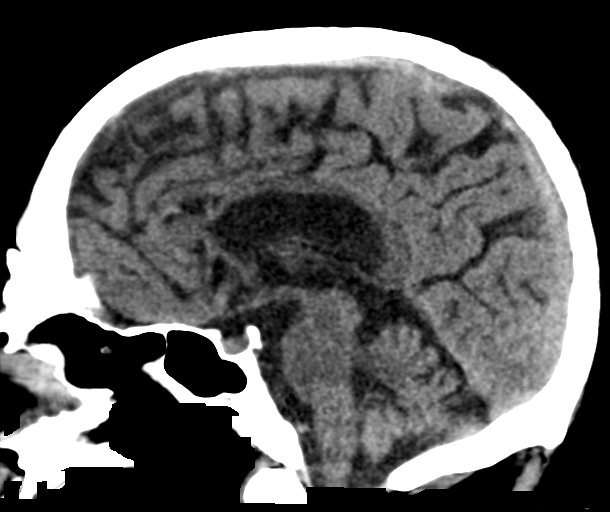
[im 33/49  brain]
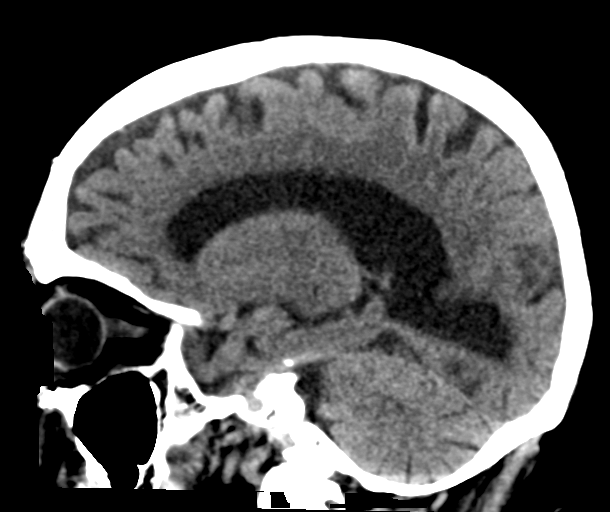

[16 of 47 positions shown; findings below may reference images not displayed]

FINDINGS: Brain: No evidence of acute infarction, hemorrhage, hydrocephalus,
extra-axial collection or mass lesion/mass effect.

Prominence of the ventricles and sulci reflects mild to moderate
cortical volume loss. Mild cerebellar atrophy is noted. Scattered
periventricular white matter change likely reflects small vessel
ischemic microangiopathy.

The brainstem and fourth ventricle are within normal limits. The
basal ganglia are unremarkable in appearance. The cerebral
hemispheres demonstrate grossly normal gray-white differentiation.
No mass effect or midline shift is seen.

Vascular: No hyperdense vessel or unexpected calcification.

Skull: There is no evidence of fracture; hardware is noted along the
left maxilla and about the nasal bone, extending superiorly to the
frontal calvarium.

Sinuses/Orbits: The orbits are within normal limits. The paranasal
sinuses and mastoid air cells are well-aerated.

Other: No significant soft tissue abnormalities are seen.
IMPRESSION: 1. No evidence of traumatic intracranial injury or fracture.
2. Mild to moderate cortical volume loss and scattered small vessel
ischemic microangiopathy.
